# Patient Record
Sex: Male | Born: 1950 | Race: Black or African American | Hispanic: No | Marital: Married | Smoking: Never smoker
Health system: Southern US, Community
[De-identification: ages and names within clinical notes are randomized; demographics above are authoritative.]

## PROBLEM LIST (undated history)

## (undated) ENCOUNTER — Ambulatory Visit: Admission: EM

## (undated) DIAGNOSIS — N200 Calculus of kidney: Secondary | ICD-10-CM

## (undated) DIAGNOSIS — Z8719 Personal history of other diseases of the digestive system: Secondary | ICD-10-CM

## (undated) DIAGNOSIS — D734 Cyst of spleen: Secondary | ICD-10-CM

## (undated) DIAGNOSIS — I10 Essential (primary) hypertension: Secondary | ICD-10-CM

## (undated) DIAGNOSIS — Z86010 Personal history of colonic polyps: Secondary | ICD-10-CM

## (undated) DIAGNOSIS — E119 Type 2 diabetes mellitus without complications: Secondary | ICD-10-CM

## (undated) DIAGNOSIS — R112 Nausea with vomiting, unspecified: Secondary | ICD-10-CM

## (undated) DIAGNOSIS — M549 Dorsalgia, unspecified: Secondary | ICD-10-CM

## (undated) DIAGNOSIS — E785 Hyperlipidemia, unspecified: Secondary | ICD-10-CM

## (undated) DIAGNOSIS — N183 Chronic kidney disease, stage 3 unspecified: Secondary | ICD-10-CM

## (undated) DIAGNOSIS — Z87448 Personal history of other diseases of urinary system: Secondary | ICD-10-CM

## (undated) DIAGNOSIS — K760 Fatty (change of) liver, not elsewhere classified: Principal | ICD-10-CM

## (undated) DIAGNOSIS — K219 Gastro-esophageal reflux disease without esophagitis: Secondary | ICD-10-CM

## (undated) DIAGNOSIS — K7689 Other specified diseases of liver: Secondary | ICD-10-CM

## (undated) DIAGNOSIS — Z87442 Personal history of urinary calculi: Secondary | ICD-10-CM

## (undated) DIAGNOSIS — G8929 Other chronic pain: Secondary | ICD-10-CM

## (undated) DIAGNOSIS — Z9889 Other specified postprocedural states: Secondary | ICD-10-CM

## (undated) DIAGNOSIS — N201 Calculus of ureter: Secondary | ICD-10-CM

## (undated) DIAGNOSIS — I2510 Atherosclerotic heart disease of native coronary artery without angina pectoris: Secondary | ICD-10-CM

## (undated) DIAGNOSIS — T7840XA Allergy, unspecified, initial encounter: Secondary | ICD-10-CM

## (undated) DIAGNOSIS — H409 Unspecified glaucoma: Secondary | ICD-10-CM

## (undated) HISTORY — PX: OTHER SURGICAL HISTORY: SHX169

## (undated) HISTORY — PX: LITHOTRIPSY: SUR834

## (undated) HISTORY — DX: Cyst of spleen: D73.4

## (undated) HISTORY — PX: WISDOM TOOTH EXTRACTION: SHX21

## (undated) HISTORY — DX: Type 2 diabetes mellitus without complications: E11.9

## (undated) HISTORY — DX: Other chronic pain: G89.29

## (undated) HISTORY — DX: Allergy, unspecified, initial encounter: T78.40XA

## (undated) HISTORY — DX: Dorsalgia, unspecified: M54.9

## (undated) HISTORY — DX: Atherosclerotic heart disease of native coronary artery without angina pectoris: I25.10

## (undated) HISTORY — PX: ESOPHAGOGASTRODUODENOSCOPY ENDOSCOPY: SHX5814

## (undated) HISTORY — DX: Fatty (change of) liver, not elsewhere classified: K76.0

## (undated) HISTORY — DX: Unspecified glaucoma: H40.9

## (undated) HISTORY — DX: Other specified diseases of liver: K76.89

## (undated) HISTORY — DX: Hyperlipidemia, unspecified: E78.5

## (undated) HISTORY — DX: Calculus of ureter: N20.1

## (undated) HISTORY — DX: Personal history of colonic polyps: Z86.010

## (undated) HISTORY — DX: Calculus of kidney: N20.0

## (undated) HISTORY — DX: Essential (primary) hypertension: I10

## (undated) HISTORY — PX: COLONOSCOPY: SHX174

## (undated) HISTORY — PX: TONSILLECTOMY: SUR1361

---

## 1999-02-24 ENCOUNTER — Ambulatory Visit (HOSPITAL_COMMUNITY): Admission: RE | Admit: 1999-02-24 | Discharge: 1999-02-24 | Payer: Self-pay | Admitting: Neurosurgery

## 1999-02-24 ENCOUNTER — Encounter: Payer: Self-pay | Admitting: Neurosurgery

## 2000-08-11 ENCOUNTER — Encounter: Payer: Self-pay | Admitting: *Deleted

## 2000-08-11 ENCOUNTER — Encounter: Admission: RE | Admit: 2000-08-11 | Discharge: 2000-08-11 | Payer: Self-pay | Admitting: *Deleted

## 2000-11-24 ENCOUNTER — Observation Stay (HOSPITAL_COMMUNITY): Admission: EM | Admit: 2000-11-24 | Discharge: 2000-11-25 | Payer: Self-pay | Admitting: Emergency Medicine

## 2000-11-24 ENCOUNTER — Encounter: Payer: Self-pay | Admitting: *Deleted

## 2000-11-27 ENCOUNTER — Encounter: Payer: Self-pay | Admitting: *Deleted

## 2000-11-27 ENCOUNTER — Ambulatory Visit (HOSPITAL_COMMUNITY): Admission: RE | Admit: 2000-11-27 | Discharge: 2000-11-27 | Payer: Self-pay | Admitting: *Deleted

## 2000-12-31 ENCOUNTER — Encounter: Admission: RE | Admit: 2000-12-31 | Discharge: 2000-12-31 | Payer: Self-pay | Admitting: *Deleted

## 2000-12-31 ENCOUNTER — Encounter: Payer: Self-pay | Admitting: *Deleted

## 2001-09-15 ENCOUNTER — Emergency Department (HOSPITAL_COMMUNITY): Admission: EM | Admit: 2001-09-15 | Discharge: 2001-09-15 | Payer: Self-pay | Admitting: Emergency Medicine

## 2001-09-15 ENCOUNTER — Encounter: Payer: Self-pay | Admitting: Emergency Medicine

## 2002-03-09 ENCOUNTER — Encounter: Payer: Self-pay | Admitting: Emergency Medicine

## 2002-03-09 ENCOUNTER — Emergency Department (HOSPITAL_COMMUNITY): Admission: EM | Admit: 2002-03-09 | Discharge: 2002-03-09 | Payer: Self-pay | Admitting: Emergency Medicine

## 2003-08-04 ENCOUNTER — Ambulatory Visit (HOSPITAL_COMMUNITY): Admission: RE | Admit: 2003-08-04 | Discharge: 2003-08-04 | Payer: Self-pay | Admitting: Urology

## 2004-04-26 ENCOUNTER — Ambulatory Visit (HOSPITAL_COMMUNITY): Admission: RE | Admit: 2004-04-26 | Discharge: 2004-04-27 | Payer: Self-pay | Admitting: Neurosurgery

## 2004-05-29 ENCOUNTER — Ambulatory Visit: Payer: Self-pay | Admitting: Gastroenterology

## 2004-07-29 HISTORY — PX: BACK SURGERY: SHX140

## 2006-04-10 ENCOUNTER — Ambulatory Visit (HOSPITAL_COMMUNITY): Admission: RE | Admit: 2006-04-10 | Discharge: 2006-04-10 | Payer: Self-pay | Admitting: Urology

## 2006-11-12 ENCOUNTER — Encounter: Admission: RE | Admit: 2006-11-12 | Discharge: 2006-11-12 | Payer: Self-pay | Admitting: Neurosurgery

## 2007-01-09 ENCOUNTER — Ambulatory Visit: Payer: Self-pay | Admitting: Gastroenterology

## 2007-03-10 ENCOUNTER — Encounter: Admission: RE | Admit: 2007-03-10 | Discharge: 2007-03-10 | Payer: Self-pay | Admitting: Orthopedic Surgery

## 2007-09-01 ENCOUNTER — Encounter: Admission: RE | Admit: 2007-09-01 | Discharge: 2007-09-01 | Payer: Self-pay | Admitting: Neurosurgery

## 2009-09-17 ENCOUNTER — Emergency Department (HOSPITAL_COMMUNITY): Admission: EM | Admit: 2009-09-17 | Discharge: 2009-09-18 | Payer: Self-pay | Admitting: Emergency Medicine

## 2010-10-17 LAB — DIFFERENTIAL
Eosinophils Relative: 0 % (ref 0–5)
Lymphs Abs: 0.4 10*3/uL — ABNORMAL LOW (ref 0.7–4.0)
Neutrophils Relative %: 91 % — ABNORMAL HIGH (ref 43–77)

## 2010-10-17 LAB — BASIC METABOLIC PANEL
CO2: 31 mEq/L (ref 19–32)
Calcium: 9.4 mg/dL (ref 8.4–10.5)
GFR calc Af Amer: >60 mL/min (ref >60)
Sodium: 142 mEq/L (ref 135–145)

## 2010-10-17 LAB — CBC
Hemoglobin: 16.3 g/dL (ref 13.0–17.0)
MCV: 93 fL (ref 78.0–100.0)
Platelets: 251 10*3/uL (ref 150–400)
RBC: 5.16 MIL/uL (ref 4.22–5.81)
RDW: 13.4 % (ref 11.5–15.5)

## 2010-12-11 NOTE — Assessment & Plan Note (Signed)
[facility]                         GASTROENTEROLOGY OFFICE NOTE   NAME:Jeffrey Holmes, Jeffrey Holmes                            MRN:          [id number]  DATE:01/09/2007                            DOB:          [birth year]    A very nice patient comes in to discuss his medications and say goodbye.  He has GERD. He has been controlled satisfactorily on Zegerid. He says  it does great. He recently had a colonoscopic examination which was as  normal as can be. His other problems are some hyperlipidemia and renal  lithiasis. Otherwise he is in pretty good health.   PHYSICAL EXAMINATION:  VITAL SIGNS:  He weighs 167, blood pressure  120/70, pulse 64 and regular.  Neck, heart and extremities are all unremarkable.   IMPRESSION:  Gastroesophageal reflux disease controlled well with  Zegerid.   RECOMMENDATIONS:  Continue on the same medication. Followup with Dr.  Carlean Purl in the future as needed.     Clarene Reamer, MD  Electronically Signed    SML/MedQ  DD: 01/09/2007  DT: 01/09/2007  Job #: [id number]

## 2010-12-14 NOTE — H&P (Signed)
[facility]  Patient:    Jeffrey Holmes, Jeffrey Holmes                        MRN: [id number] Adm. Date:  [id number] Attending:  Larene Beach                         History and Physical  DATE OF BIRTH:  02-Sep-1950  REASON FOR ADMISSION:  This patient, age 60, presented November 24, 2000, at the office with a four to five day history of pain in the right side associated with nausea and vomiting.  HISTORY OF PRESENT ILLNESS:  This 60 year old male has a history of stones dating back to 1973 and thinks he has past about 10 stones in all.  He has been followed in our office since 1987 at irregular intervals.  The patient passed a stone January 2002 on the left without complications.  He is known to have bilateral renal calculi by x-ray and January 2002 he had a PSA of 0.4. He has not seen any blood, gravel, or stone in the urine.  His pains and nausea and vomiting started November 19, 2000.  Pain only partially controlled with [employer]  He was sent from the office to the emergency room where CT scan was performed that suggested a 4 mm calculus obstructing the upper right ureter, and he also had bilateral renal calcifications.  Because of continued pain, nausea and vomiting, impending dehydration, the patient was advised to have cystoscopy, right ureteral stent, and this will be performed November 24, 2000, after which we will schedule him for shockwave lithotripsy.  ALLERGIES:  None.  PRESENT MEDICATIONS: 1. Naproxen 500 mg b.i.d. 2. Allegra 1 q.d. 3. Mepergan Fortis 1-2 q.4-6h. p.r.n. for pain.  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  REVIEW OF SYSTEMS:  GENERAL:  Health has been good and weight stable.  HEENT: He has occasional headaches and dizziness.  CARDIORESPIRATORY:  Denies any chest pain,  heart attack, no swelling of his feet.  GI:  No hepatitis, no peptic ulcer disease, bowels have been normal.  BONES/JOINTS/MUSCLES:  He has some shoulder pain.  Occasional back pain.  NEUROPSYCHIATRIC:  Unremarkable.  FAMILY HISTORY:  There is positive history for heart disease, kidney disease, high blood pressure, diabetes.  The mother died at age 6 of kidney failure. Father died of some type of cancer at age 77.  The patient has one child.  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 He does not abuse alcohol or tobacco.  PHYSICAL EXAMINATION:  VITAL SIGNS:  Temperature 96.9, pulse 80, respirations 12, and blood pressure 140/90.  GENERAL:  Well-developed, well-nourished, 60 year old male acutely ill with right renal colic.  HEENT:  The ears and TMs are unremarkable.  Eyes react normally to light and accommodation.  Extraocular movements are intact.  Pharynx benign.  Tongue is a little bit dry.  NECK:  No enlargement of thyroid or nodes palpable.  LUNGS:  Clear to auscultation and percussion.  HEART:  No murmur detected.  ABDOMEN:  There is tenderness in the right flank.  Liver, kidney, spleen masses, hernia not detected.  Mild obesity.  GENITAL:  The urethral meatus is normal.  The  penis is circumcised.  Testes are good size, symmetrical, nontender.  Scrotum, anus, and perineum normal. Rectal tone good.  Prostate is 20 grams and symmetrical, smooth, soft, and slightly tender.  EXTREMITIES:  No edema.  Good peripheral pulses.  NEUROLOGIC:  Grossly normal reflexes and sensation.  IMPRESSION: 1. Right ureteral calculus, upper ureter with hydronephrosis by CT scan. 2. Bilateral renal calcifications. 3. Benign prostate hypertrophy.  PLAN:  Cystoscopy, insertion of a right ureteral stent (#6 Kwart) to be followed by shockwave lithotripsy to fragment the right ureteral stone. DD:  11/24/00 TD:  11/25/00 Job: [id number]

## 2010-12-14 NOTE — Op Note (Signed)
[facility]  Patient:    Jeffrey Holmes, Jeffrey Holmes                        MRN: [id number] Proc. Date: 11/24/00 Adm. Date:  [id number] Attending:  Larene Beach                           Operative Report  PREOPERATIVE DIAGNOSES: 1. Right ureteral calculus with hydronephrosis. 2. Bilateral renal calculi. 3. Benign prostatic hypertrophy. 4. Right atelectasis, mild.  POSTOPERATIVE DIAGNOSES: 1. Right ureteral calculus with hydronephrosis. 2. Bilateral renal calculi. 3. Benign prostatic hypertrophy. 4. Right atelectasis, mild.  OPERATION PERFORMED:  Cystoscopy, insertion of right ureteral stent.  DESCRIPTION OF PROCEDURE:  This 60 year old male brought to the operating room underwent successful induction of general anesthesia was prepped and draped in the lithotomy position received IV Cipro in preparation for surgery. The patient was prepped and draped in the lithotomy position in the usual fashion, bladder inspected with a #22 Olympus cystoscope using the 70 degree lens and no stone, tumor nor ulcer was noted. The right and left ureteral orifices were normal. The patient had a neck to veru distance of 2-3 cm, a little anterior notching. The right ureteral orifice accepted an 038 Glidewire and over the [place] was passed a #6 open ended catheter which passed to the region of the right renal pelvis under fluoroscopic control. The dye was then injected for retrograde pyelogram. There was only slight dilation of the renal pelvis. The open ended catheter was then used to reintroduce an 038 Glidewire and over the [place] was passed a #6 Kwart type double J ureteral stent which was passed to the region of the right renal pelvis. The Glidewire was then removed and the fluoroscopic examination showed a good curl in the region of the renal pelvis. Cystoscopically there was noted to be a good curl in the bladder.  All instruments removed, the bladder  was emptied, and the patient returned the recovery area in a stable condition.  PLAN:  Observe the patient overnight, give him Cipro prophylaxis against infection. We will instruct him not to pull the string in his urethra. We will give him Demerol for pain, Phenergan for nausea, IV fluids, incentive spirometry in view of his atelectasis and will let him go home in the morning if he is stable and afebrile in preparation for shock wave lithotripsy. DD:  11/24/00 TD:  11/25/00 Job: [id number]

## 2010-12-14 NOTE — Op Note (Signed)
Jeffrey Holmes, Jeffrey Holmes NO.:  [id number]   MEDICAL RECORD NO.:  [id number]          PATIENT TYPE:  OIB   LOCATION:  2889                         FACILITY:  [facility]   PHYSICIAN:  Hosie Spangle, M.D.DATE OF BIRTH:  October 08, 1950   DATE OF PROCEDURE:  04/26/2004  DATE OF DISCHARGE:                                 OPERATIVE REPORT   PREOPERATIVE DIAGNOSIS:  Left L5-S1 lumbar disk herniation, lumbar  spondylosis, lumbar degenerative disk disease, and lumbar radiculopathy.   POSTOPERATIVE DIAGNOSIS:  Left L5-S1 lumbar disk herniation, lumbar  spondylosis, lumbar degenerative disk disease, and lumbar radiculopathy.   OPERATION PERFORMED:  Left L5-S1 lumbar laminotomy and microdiskectomy with  microdissection.   SURGEON:  Hosie Spangle, M.D.   ASSISTANT:  Ashok Pall, M.D.   ANESTHESIA:  General endotracheal.   INDICATIONS FOR PROCEDURE:  The patient is a 60 year old man who presented  with a left lumbar radiculopathy.  He was found by MRI to have left L5-S1  lumbar disk herniation superimposed upon underlying degenerative disk  disease and spondylosis.  A decision was made to proceed with elective  laminotomy and microdiskectomy.   DESCRIPTION OF PROCEDURE:  The patient was brought to the operating room and  placed under general endotracheal anesthesia.  The patient was turned to a  prone position.  Lumbar region was prepped with Betadine soap and solution  and draped in sterile fashion.  An X-ray was taken and the L5-S1  interlaminar space was identified.  The overlying skin and subcutaneous  tissue were infiltrated with local anesthetic with epinephrine.  Incision  was made over the L5-S1 level and carried down through the subcutaneous  tissue.  Bipolar cautery and electrocautery were used to maintain  hemostasis.  Dissection was carried down to the lumbar fascia which was  incised on the left side of the midline in the paraspinal muscles.  We  dissected  the spinous process and lamina in subperiosteal fashion.  The self-  retaining retractor was placed and the L-S1 interlaminar space was  identified and x-ray was taken to confirm the localization and then the  operating microscope was draped and brought into the field to provide  additional magnification, illumination and visualization and the remainder  of the decompression was performed using microdissection and microsurgical  technique.  A laminotomy was performed using the X-Max drill and Kerrison  punches.  The ligamentum flavum was carefully removed and we identified the  thecal sac and exiting left S1 nerve root.  Foraminotomy was performed for  the left S1 nerve root.  We gently retracted the thecal sac and nerve root  medially exposing the subligamentous disk herniation.  The remaining annular  fibers were incised and diskectomy begun with pituitary rongeurs and  microcurettes.  There was spondylitic overgrowth on the posterior aspect of  L5 and S1 that was removed using an osteophyte removal tool and in the end,  all loose fragments of disk material were removed from both the disk space  and the epidural space and good decompression of the thecal sac and nerve  root was achieved.  Hemostasis was established with the use of bipolar  cautery as well as Gelfoam soaked in thrombin and bone wax to wax the edges  of the bone.  All the Gelfoam was removed prior to closure.  Good hemostasis  was established and once decompression was completed and hemostasis  confirmed, we instilled 2 mL of fentanyl and 80 mg of Depo-Medrol into the  epidural space and proceeded with closure.  The deep fascia was closed with  interrupted #1 undyed Vicryl sutures, the subcutaneous and subcuticular  layer were closed with interrupted inverted 2-0 undyed Vicryl sutures and  skin edges closed with Dermabond.  The patient tolerated the procedure well.  The estimated blood loss for this procedure was less than  25 mL.  Sponge,  needle and instrument counts were correct.  Following surgery the patient  was turned back to supine position to be reversed from anesthetic, extubated  and transferred to recovery room for further care.       RWN/MEDQ  D:  04/26/2004  T:  04/26/2004  Job:  [id number]

## 2011-05-17 ENCOUNTER — Encounter: Payer: Self-pay | Admitting: Gastroenterology

## 2012-02-14 ENCOUNTER — Other Ambulatory Visit: Payer: Self-pay | Admitting: Urology

## 2012-02-14 ENCOUNTER — Encounter (HOSPITAL_COMMUNITY): Payer: Self-pay | Admitting: *Deleted

## 2012-02-16 NOTE — H&P (Signed)
een today as a work-in for complaint of flank pain and N/V.   Active Problems Problems  1. Hydronephrosis On The Left 591 2. 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 Proximal Ureteral Stone On The Left 592.1 4. Renal Cyst 593.2  History of Present Illness        61 YO male patient of Dr. Ralene Muskrat seen today as a work-in for complaint of flank pain and N/V.  Longstanding h/o nephrolithiasis presents for acute evaluation of left flank pain.  The pain began acutely last pm and has persisted.  He has not seen a stone pass.  His pain radiates into the LLQ abdomen.  The pain is unchanged with position or activity. He denies gross hematuria, dysuria, increased frequency, urgency, fever or chills.  The pain is typical for previous stones.  Last pm pain severe enough to cause N/V - "I haven't done that in a long time."   Past Medical History Problems  1. History of  Esophageal Reflux 530.81 2. History of  Hypercholesterolemia 272.0 3. History of  Hypertension 401.9 4. History of  Nephrolithiasis V13.01  Surgical History Problems  1. History of  Ant Spinal Diskect Osteophytect Lumb Interspace Microdiscect  Current Meds 1. Caduet TABS; Therapy: (Recorded:20Feb2009) to 2. Cialis 20 MG Oral Tablet; TAKE 1 TABLET As Directed PRN; Therapy: 20Feb2009 to (Last  Rx:18Nov2011) 3. Cialis 5 MG Oral Tablet; Take 1 tablet daily; Therapy: 20Feb2009 to (Evaluate:15Feb2010); Last  Rx:20Feb2009 4. Ibuprofen 800 MG Oral Tablet; Therapy: (Recorded:20Feb2009) to 5. Meperidine HCl 50 MG Oral Tablet; TAKE 1 TO 2 TABLETS EVERY 4 HOURS AS NEEDED;  Therapy: [id number] to (Evaluate:19May2012); Last Rx:16May2012 6. Promethazine HCl 25 MG Oral Tablet; TAKE 1 TABLET Every  6 hours PRN; Therapy: [id number]  to (Last Rx:16May2012)  Allergies Medication  1. Codeine Derivatives 2. OxyCONTIN TB12 3. Percocet TABS  Family History Problems  1. Paternal history of  Death In The Family Father Throat CancerAge 31 2.  Family history of  Death In The Family Mother Renal FailureAge 83 3. Family history of  Family Health Status Number Of Children One daughter 57. Paternal history of  Laryngeal Cancer V16.2 5. Maternal history of  Renal Failure  Social History Problems  1. Caffeine Use 2-3 per day 2. 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 Alcohol Use 5. Tobacco Use  Review of Systems Genitourinary, constitutional, skin, eye, otolaryngeal, hematologic/lymphatic, cardiovascular, pulmonary, endocrine, musculoskeletal, gastrointestinal, neurological and psychiatric system(s) were reviewed and pertinent findings if present are noted.  Gastrointestinal: nausea, vomiting, flank pain and abdominal pain.    Vitals Vital Signs [Data Includes: Last 1 Day]  [id number] 10:09AM  Blood Pressure: 155 / 84 Temperature: 98.4 F Heart Rate: 64  Physical Exam Constitutional: Well nourished and well developed . No acute distress. The patient appears well hydrated.  Abdomen: The abdomen is flat. The abdomen is soft and nontender. No suprapubic tenderness. No CVA tenderness.  Skin: Normal skin turgor and normal skin color and pigmentation.  Neuro/Psych:. Mood and affect are appropriate.    Results/Data Urine [Data Includes: Last 1 Day]   [id number]  COLOR YELLOW   APPEARANCE CLEAR   SPECIFIC GRAVITY 1.025   pH 5.5   GLUCOSE NEG mg/dL  BILIRUBIN NEG   KETONE NEG mg/dL  BLOOD LARGE   PROTEIN 30 mg/dL  UROBILINOGEN 0.2 mg/dL  NITRITE NEG   LEUKOCYTE ESTERASE NEG   SQUAMOUS EPITHELIAL/HPF NONE SEEN   WBC 0-2 WBC/hpf  RBC 11-20 RBC/hpf  BACTERIA NONE SEEN   CRYSTALS NONE SEEN   CASTS Hyaline casts noted    The following images/tracing/specimen were independently visualized:  CTU: bilateral renal calculi. Mild/moderate left hydronephrosis secondary to a 9-10 mm left UPJ calculus. No distal bilateral ureteral calculus noted. Right renal cyst.  The following clinical lab  reports were reviewed:  UA. Selected Results  AU CT-STONE PROTOCOL [id number] 12:00AM Jimmey Ralph   Test Name Result Flag Reference  ** RADIOLOGY REPORT BY [facility] ** ORIGINAL APPROVED BY: Areta Haber, M.D. ON: 02/14/2012 11:37:36   *RADIOLOGY REPORT*  Clinical Data: Microscopic hematuria. Left flank and left lower quadrant pain. Known right renal cyst. History renal stones, prior lithotripsy. Benign prostatic hyperplasia. Low grade fever.  CT ABDOMEN AND PELVIS WITHOUT CONTRAST (CT UROGRAM)  Technique: Contiguous axial images of the abdomen and pelvis without oral or intravenous contrast were obtained.  Comparison: Plain film of 12/11/2010. CT urogram of 03/22/2005. Prior report not available.  Findings: Exam is limited for evaluation of entities other than urinary tract calculi due to lack of oral or intravenous contrast.   Bibasilar volume loss with mild scarring or atelectasis. Mild cardiomegaly, without pericardial or pleural effusion. Hepatic dome is incompletely imaged. Moderate hepatic steatosis, which is heterogeneous. Hepatomegaly, approximately 19 cm.  An area of greater hypoattenuation in the lateral segment left lobe measures 2.1 cm on image 10 and is new from the prior. Fluid density.  A subcapsular right hepatic lobe 1.2 cm subtly hypoattenuating lesion on image 20 is also fluid density.  Normal spleen, stomach, pancreas, gallbladder, biliary tract, adrenal glands.  Bilateral fluid density renal lesions, likely cysts.  Innumerable calcifications within the renal collecting systems and renal medulla. A proximal left ureteric stone which measures 9x12 mm on image 27. Minimal proximal caliectasis.  The distal ureters are normal in caliber.  Nonaneurysmal infrarenal abdominal aortic dilatation at 1.8 cm similar. No retroperitoneal or retrocrural adenopathy.  Scattered colonic diverticula. Normal terminal ileum and appendix. Normal  small bowel without abdominal ascites.   No pelvic adenopathy.  Normal urinary bladder and prostate. No significant free fluid. Periumbilical tiny fat containing ventral hernia on image 48. Partial fusion of the bilateral sacroiliac joints which is likely degenerative. Degenerative disc disease at the lumbosacral junction is advanced.  IMPRESSION:  1. Proximal left ureteric stone of 1.2 cm with relatively mild proximal obstruction. 2. Findings suspicious for medullary nephrocalcinosis, with innumerable renal collecting system and medullary stones bilaterally. 3. Hepatomegaly and heterogeneous hepatic steatosis. Foci of even greater hypoattenuation within the liver are indeterminate but could represent cysts. Not readily apparent on the prior exam of 2006. Especially if there is any history of primary malignancy or known liver disease, outpatient pre and post contrast abdominal MRI should be considered.   Assessment Assessed  1. Nephrolithiasis Of Both Kidneys 592.0 2. Hydronephrosis On The Left 591 3. Proximal Ureteral Stone On The Left 592.1 4. Renal Cyst 593.2  Plan  Health Maintenance (V70.0)  1. UA With REFLEX  Done: [id number] 10:01AM Nephrolithiasis Of Both Kidneys (592.0)  2. Ketorolac Tromethamine 60 MG/2ML Injection Solution; INJECT 60  MG Intramuscular; To Be  Done: [id number]; Status: HOLD FOR - Administration 3. Nucynta 50 MG Oral Tablet; Take 1 tablet every 6 hours; Therapy: [phone] to (Last  Rx:19Jul2013)      1. Ketorolac 60 mg IM. Mild relief 2. Nucynta 50 mg 1 po Q6 hrs prn #40/0RF  3. Schedule (L) ESWL by Dr. Jeffie Pollock. Risks and benefits discussed which include:  possible need for cystoureterscopy, L RPG, stone extraction if he develops acute pain, N/V, or possible non-fragmentation of stone. All questions addressed and answered to pt's satisfication. Wishes to proceed

## 2012-02-17 ENCOUNTER — Encounter (HOSPITAL_COMMUNITY): Payer: Self-pay | Admitting: *Deleted

## 2012-02-17 ENCOUNTER — Ambulatory Visit (HOSPITAL_COMMUNITY)
Admission: RE | Admit: 2012-02-17 | Discharge: 2012-02-17 | Disposition: A | Discharge status: Home / Self Care | Payer: Federal, State, Local not specified - PPO | Source: Ambulatory Visit | Attending: Urology | Admitting: Urology

## 2012-02-17 ENCOUNTER — Encounter (HOSPITAL_COMMUNITY): Payer: Self-pay | Admitting: Pharmacy Technician

## 2012-02-17 ENCOUNTER — Ambulatory Visit (HOSPITAL_COMMUNITY): Payer: Federal, State, Local not specified - PPO

## 2012-02-17 ENCOUNTER — Encounter (HOSPITAL_COMMUNITY)
Admission: RE | Disposition: A | Discharge status: Home / Self Care | Payer: Self-pay | Source: Ambulatory Visit | Attending: Urology

## 2012-02-17 DIAGNOSIS — N200 Calculus of kidney: Secondary | ICD-10-CM | POA: Insufficient documentation

## 2012-02-17 DIAGNOSIS — I10 Essential (primary) hypertension: Secondary | ICD-10-CM | POA: Insufficient documentation

## 2012-02-17 DIAGNOSIS — N201 Calculus of ureter: Secondary | ICD-10-CM

## 2012-02-17 DIAGNOSIS — Z79899 Other long term (current) drug therapy: Secondary | ICD-10-CM | POA: Insufficient documentation

## 2012-02-17 DIAGNOSIS — K219 Gastro-esophageal reflux disease without esophagitis: Secondary | ICD-10-CM | POA: Insufficient documentation

## 2012-02-17 DIAGNOSIS — E7800 Pure hypercholesterolemia, unspecified: Secondary | ICD-10-CM | POA: Insufficient documentation

## 2012-02-17 DIAGNOSIS — N1330 Unspecified hydronephrosis: Secondary | ICD-10-CM | POA: Insufficient documentation

## 2012-02-17 HISTORY — DX: Essential (primary) hypertension: I10

## 2012-02-17 SURGERY — LITHOTRIPSY, ESWL
Anesthesia: LOCAL | Laterality: Left

## 2012-02-17 MED ORDER — CIPROFLOXACIN HCL 500 MG PO TABS
500.0000 mg | ORAL_TABLET | ORAL | Status: AC
  Administered 2012-02-17: 500 mg via ORAL

## 2012-02-17 MED ORDER — DIAZEPAM 5 MG PO TABS
ORAL_TABLET | ORAL | Status: AC
  Filled 2012-02-17: qty 2

## 2012-02-17 MED ORDER — DIPHENHYDRAMINE HCL 25 MG PO CAPS
25.0000 mg | ORAL_CAPSULE | ORAL | Status: AC
  Administered 2012-02-17: 25 mg via ORAL

## 2012-02-17 MED ORDER — DIPHENHYDRAMINE HCL 25 MG PO CAPS
ORAL_CAPSULE | ORAL | Status: AC
  Filled 2012-02-17: qty 1

## 2012-02-17 MED ORDER — DIAZEPAM 5 MG PO TABS
10.0000 mg | ORAL_TABLET | ORAL | Status: AC
  Administered 2012-02-17: 10 mg via ORAL

## 2012-02-17 MED ORDER — DEXTROSE-NACL 5-0.45 % IV SOLN
INTRAVENOUS | Status: DC
  Administered 2012-02-17: 1000 mL via INTRAVENOUS

## 2012-02-17 MED ORDER — CIPROFLOXACIN HCL 500 MG PO TABS
ORAL_TABLET | ORAL | Status: AC
  Filled 2012-02-17: qty 1

## 2012-02-17 NOTE — Interval H&P Note (Signed)
History and Physical Interval Note:  02/17/2012 7:30 AM  Jeffrey Holmes  has presented today for surgery, with the diagnosis of LEFT HYDRONEPHOSIS DUE TO 34mm LEFT URETEROPELVIC JUNCTION CALCULUS  The various methods of treatment have been discussed with the patient and family. After consideration of risks, benefits and other options for treatment, the patient has consented to  Procedure(s) (LRB): EXTRACORPOREAL SHOCK WAVE LITHOTRIPSY (ESWL) (Left) as a surgical intervention .  The patient's history has been reviewed, patient examined, no change in status, stable for surgery.  I have reviewed the patient's chart and labs.  Questions were answered to the patient's satisfaction.     Ramsie Ostrander J

## 2012-03-12 ENCOUNTER — Other Ambulatory Visit: Payer: Self-pay | Admitting: Urology

## 2012-04-01 ENCOUNTER — Encounter (HOSPITAL_COMMUNITY): Payer: Self-pay | Admitting: Pharmacy Technician

## 2012-04-13 ENCOUNTER — Encounter (HOSPITAL_COMMUNITY): Payer: Self-pay | Admitting: *Deleted

## 2012-04-13 NOTE — Pre-Procedure Instructions (Signed)
Asked to bring blue folder the day of the procedure,insurance card,I.D. driver's license,wear comfortable clothing and have a driver for the day. Asked not to take Advil,Motrin,Ibuprofen,Aleve or any NSAIDS, Aspirin, or Toradol for 72 hours prior to procedure,  No vitamins or herbal medications 7 days prior to procedure. Instructed to take laxative per doctor's office instructions and eat a light dinner the evening before procedure.   To arrive at 1030 for lithotripsy procedure.

## 2012-04-15 NOTE — H&P (Signed)
ctive Problems Problems  1. Benign Prostatic Hypertrophy 600.00 2. Diffuse Abdominal Pain 789.07 right flank pain 3. Hydronephrosis On The Left 591 4. 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 Organic Impotence 607.84 6. Proximal Ureteral Stone On The Left 592.1 7. Pyuria 791.9 8. Renal Cyst 593.2 9. Ureteral Stone Left 592.1  History of Present Illness  Asir returns today in f/u from ESWL for a left ureteral stone.  He has pass a few fragments.  He is having minimal pain.  He was seen on 8/2 and still had some large left mid ureteral fragments.   Past Medical History Problems  1. History of  Esophageal Reflux 530.81 2. History of  Hypercholesterolemia 272.0 3. History of  Hypertension 401.9 4. History of  Nephrolithiasis V13.01  Surgical History Problems  1. History of  Ant Spinal Diskect Osteophytect Lumb Interspace Microdiscect 2. History of  Lithotripsy  Current Meds 1. Amlodipine-Atorvastatin 10-20 MG Oral Tablet; Therapy: [id number] to 2. CeleBREX 200 MG Oral Capsule; Therapy: [id number] to 3. Cialis 20 MG Oral Tablet; TAKE 1 TABLET As Directed PRN; Therapy: 20Feb2009 to (Last  Rx:18Nov2011) 4. Ibuprofen 800 MG Oral Tablet; Therapy: (Recorded:20Feb2009) to 5. Nucynta 50 MG Oral Tablet; Take 1 tablet every 6 hours; Therapy: [id number] to (Last  Rx:19Jul2013) 6. Tamsulosin HCl 0.4 MG Oral Capsule; TAKE 1 CAPSULE BY MOUTH  DAILY; Therapy:  02Aug2013 to (Last Rx:02Aug2013)  Requested for: 02Aug2013 7. Urocit-K 15 15 MEQ (1620 MG) Oral Tablet Extended Release; TAKE 1 TABLET Daily; Therapy:  02Aug2013 to (Last Rx:02Aug2013)  Allergies Medication  1. Codeine Derivatives 2. OxyCONTIN TB12 3. Percocet TABS  Family History Problems  1. Paternal history of  Death In The Family Father Throat CancerAge 31 2. Family history of  Death In The Family Mother Renal FailureAge 47 3. Family history of  Family Health Status Number Of Children One daughter 40. Paternal history of   Laryngeal Cancer V16.2 5. Maternal history of  Renal Failure  Social History Problems  1. Caffeine Use 2-3 per day 2. Marital History - Currently Married 3. 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 Alcohol Use 6. Tobacco Use  Review of Systems  Gastrointestinal: no nausea.  Constitutional: no fever.    Vitals Vital Signs [Data Includes: Last 1 Day]  09Aug2013 08:36AM  Blood Pressure: 188 / 90 Temperature: 97.5 F Heart Rate: 61  Results/Data Urine [Data Includes: Last 1 Day]   09Aug2013  COLOR YELLOW   APPEARANCE CLEAR   SPECIFIC GRAVITY 1.025   pH 6.0   GLUCOSE NEG mg/dL  BILIRUBIN NEG   KETONE NEG mg/dL  BLOOD SMALL   PROTEIN TRACE mg/dL  UROBILINOGEN 0.2 mg/dL  NITRITE NEG   LEUKOCYTE ESTERASE NEG   SQUAMOUS EPITHELIAL/HPF NONE SEEN   WBC 0-2 WBC/hpf  RBC 0-2 RBC/hpf  BACTERIA NONE SEEN   CRYSTALS NONE SEEN   CASTS Hyaline casts noted    The following images/tracing/specimen were independently visualized:  KUB today shows a stable LLP stone complex and his left proximal ureteral stone is unchanged from his film on 8/2 and measures about 8x34mm. No other new findings are noted.    Assessment Assessed  1. Proximal Ureteral Stone On The Left 592.1   His stone hasn't moved since 8/2 but he is minimally symptomatic.   Plan Health Maintenance (V70.0)  1. UA With REFLEX  Done: [id number] 08:21AM Proximal Ureteral Stone On The Left (592.1)  2. Follow-up Schedule Surgery Office  Follow-up  Requested for: 09Aug2013 3.  KUB  Requested for: 09Aug2013   I discussed his options which included continued observation with Rapaflo, repeat ESWL or ureteroscopy.   I explained that with the ESWL he may still need ureteroscopy and could required initial stenting with delayed ureteroscopy if he has a tight ureter.   He would like to proceed with repeat ESWL in about 3 weeks and I will get a KUB 3-4 days ahead just to make sure he wouldn't be better served with  ureteroscopy if the stone has migrated.

## 2012-04-16 ENCOUNTER — Encounter (HOSPITAL_COMMUNITY): Payer: Self-pay | Admitting: *Deleted

## 2012-04-16 ENCOUNTER — Ambulatory Visit (HOSPITAL_COMMUNITY)
Admission: RE | Admit: 2012-04-16 | Discharge: 2012-04-16 | Disposition: A | Discharge status: Home / Self Care | Payer: Federal, State, Local not specified - PPO | Source: Ambulatory Visit | Attending: Urology | Admitting: Urology

## 2012-04-16 ENCOUNTER — Encounter (HOSPITAL_COMMUNITY)
Admission: RE | Disposition: A | Discharge status: Home / Self Care | Payer: Self-pay | Source: Ambulatory Visit | Attending: Urology

## 2012-04-16 DIAGNOSIS — I10 Essential (primary) hypertension: Secondary | ICD-10-CM | POA: Insufficient documentation

## 2012-04-16 DIAGNOSIS — N201 Calculus of ureter: Secondary | ICD-10-CM | POA: Insufficient documentation

## 2012-04-16 DIAGNOSIS — E7800 Pure hypercholesterolemia, unspecified: Secondary | ICD-10-CM | POA: Insufficient documentation

## 2012-04-16 DIAGNOSIS — K219 Gastro-esophageal reflux disease without esophagitis: Secondary | ICD-10-CM | POA: Insufficient documentation

## 2012-04-16 DIAGNOSIS — N1330 Unspecified hydronephrosis: Secondary | ICD-10-CM | POA: Insufficient documentation

## 2012-04-16 DIAGNOSIS — Z79899 Other long term (current) drug therapy: Secondary | ICD-10-CM | POA: Insufficient documentation

## 2012-04-16 DIAGNOSIS — N400 Enlarged prostate without lower urinary tract symptoms: Secondary | ICD-10-CM | POA: Insufficient documentation

## 2012-04-16 SURGERY — LITHOTRIPSY, ESWL
Anesthesia: LOCAL | Laterality: Left

## 2012-04-16 MED ORDER — DIPHENHYDRAMINE HCL 25 MG PO CAPS
25.0000 mg | ORAL_CAPSULE | ORAL | Status: AC
  Administered 2012-04-16: 25 mg via ORAL

## 2012-04-16 MED ORDER — DEXTROSE-NACL 5-0.45 % IV SOLN
INTRAVENOUS | Status: DC
  Administered 2012-04-16: 12:00:00 via INTRAVENOUS

## 2012-04-16 MED ORDER — OXYCODONE HCL 5 MG PO TABS
5.0000 mg | ORAL_TABLET | ORAL | Status: DC | PRN
  Administered 2012-04-16: 5 mg via ORAL
  Filled 2012-04-16: qty 1

## 2012-04-16 MED ORDER — DIAZEPAM 5 MG PO TABS
ORAL_TABLET | ORAL | Status: AC
  Administered 2012-04-16: 10 mg via ORAL
  Filled 2012-04-16: qty 2

## 2012-04-16 MED ORDER — DIPHENHYDRAMINE HCL 25 MG PO CAPS
ORAL_CAPSULE | ORAL | Status: AC
  Administered 2012-04-16: 25 mg via ORAL
  Filled 2012-04-16: qty 1

## 2012-04-16 MED ORDER — DIAZEPAM 5 MG PO TABS
10.0000 mg | ORAL_TABLET | ORAL | Status: AC
  Administered 2012-04-16: 10 mg via ORAL

## 2012-04-16 MED ORDER — CIPROFLOXACIN HCL 500 MG PO TABS
500.0000 mg | ORAL_TABLET | ORAL | Status: AC
  Administered 2012-04-16: 500 mg via ORAL
  Filled 2012-04-16: qty 1

## 2012-04-16 NOTE — Interval H&P Note (Signed)
History and Physical Interval Note:  04/16/2012 12:34 PM  [name]  has presented today for surgery, with the diagnosis of Left Proximal Stone  The various methods of treatment have been discussed with the patient and family. After consideration of risks, benefits and other options for treatment, the patient has consented to  Procedure(s) (LRB) with comments: EXTRACORPOREAL SHOCK WAVE LITHOTRIPSY (ESWL) (Left) as a surgical intervention .  The patient's history has been reviewed, patient examined, no change in status, stable for surgery.  I have reviewed the patient's chart and labs.  Questions were answered to the patient's satisfaction.     Jeffrey Holmes

## 2012-04-17 ENCOUNTER — Encounter: Payer: Self-pay | Admitting: Gastroenterology

## 2013-07-29 DIAGNOSIS — Z87448 Personal history of other diseases of urinary system: Secondary | ICD-10-CM

## 2013-07-29 HISTORY — DX: Personal history of other diseases of urinary system: Z87.448

## 2013-12-22 ENCOUNTER — Other Ambulatory Visit: Payer: Self-pay | Admitting: Urology

## 2013-12-29 ENCOUNTER — Encounter (HOSPITAL_COMMUNITY): Payer: Self-pay | Admitting: Pharmacy Technician

## 2014-01-03 ENCOUNTER — Other Ambulatory Visit: Payer: Self-pay | Admitting: Urology

## 2014-01-03 DIAGNOSIS — D7389 Other diseases of spleen: Secondary | ICD-10-CM

## 2014-01-03 DIAGNOSIS — D739 Disease of spleen, unspecified: Principal | ICD-10-CM

## 2014-01-04 ENCOUNTER — Encounter (HOSPITAL_COMMUNITY): Payer: Self-pay

## 2014-01-05 ENCOUNTER — Other Ambulatory Visit: Payer: Self-pay | Admitting: Urology

## 2014-01-05 ENCOUNTER — Ambulatory Visit
Admission: RE | Admit: 2014-01-05 | Discharge: 2014-01-05 | Disposition: A | Discharge status: Home / Self Care | Payer: Federal, State, Local not specified - PPO | Source: Ambulatory Visit | Attending: Urology | Admitting: Urology

## 2014-01-05 DIAGNOSIS — D7389 Other diseases of spleen: Secondary | ICD-10-CM

## 2014-01-05 DIAGNOSIS — D739 Disease of spleen, unspecified: Principal | ICD-10-CM

## 2014-01-06 ENCOUNTER — Inpatient Hospital Stay (HOSPITAL_COMMUNITY): Payer: Federal, State, Local not specified - PPO | Admitting: Registered Nurse

## 2014-01-06 ENCOUNTER — Encounter (HOSPITAL_COMMUNITY): Payer: Federal, State, Local not specified - PPO | Admitting: Registered Nurse

## 2014-01-06 ENCOUNTER — Encounter (HOSPITAL_COMMUNITY): Payer: Self-pay

## 2014-01-06 ENCOUNTER — Inpatient Hospital Stay (HOSPITAL_COMMUNITY): Payer: Federal, State, Local not specified - PPO

## 2014-01-06 ENCOUNTER — Inpatient Hospital Stay (HOSPITAL_COMMUNITY)
Admission: AD | Admit: 2014-01-06 | Discharge: 2014-01-10 | DRG: 693 | Disposition: A | Discharge status: Home / Self Care | Payer: Federal, State, Local not specified - PPO | Source: Ambulatory Visit | Attending: Urology | Admitting: Urology

## 2014-01-06 ENCOUNTER — Encounter (HOSPITAL_COMMUNITY)
Admission: AD | Disposition: A | Discharge status: Home / Self Care | Payer: Self-pay | Source: Ambulatory Visit | Attending: Urology

## 2014-01-06 ENCOUNTER — Other Ambulatory Visit: Payer: Self-pay

## 2014-01-06 DIAGNOSIS — N1330 Unspecified hydronephrosis: Secondary | ICD-10-CM

## 2014-01-06 DIAGNOSIS — K219 Gastro-esophageal reflux disease without esophagitis: Secondary | ICD-10-CM | POA: Diagnosis present

## 2014-01-06 DIAGNOSIS — I129 Hypertensive chronic kidney disease with stage 1 through stage 4 chronic kidney disease, or unspecified chronic kidney disease: Secondary | ICD-10-CM | POA: Diagnosis present

## 2014-01-06 DIAGNOSIS — Z87448 Personal history of other diseases of urinary system: Secondary | ICD-10-CM | POA: Diagnosis present

## 2014-01-06 DIAGNOSIS — N170 Acute kidney failure with tubular necrosis: Secondary | ICD-10-CM | POA: Diagnosis present

## 2014-01-06 DIAGNOSIS — N12 Tubulo-interstitial nephritis, not specified as acute or chronic: Secondary | ICD-10-CM | POA: Diagnosis present

## 2014-01-06 DIAGNOSIS — E7800 Pure hypercholesterolemia, unspecified: Secondary | ICD-10-CM | POA: Diagnosis present

## 2014-01-06 DIAGNOSIS — E860 Dehydration: Secondary | ICD-10-CM | POA: Diagnosis present

## 2014-01-06 DIAGNOSIS — N201 Calculus of ureter: Secondary | ICD-10-CM

## 2014-01-06 DIAGNOSIS — E875 Hyperkalemia: Secondary | ICD-10-CM | POA: Diagnosis present

## 2014-01-06 DIAGNOSIS — T3995XA Adverse effect of unspecified nonopioid analgesic, antipyretic and antirheumatic, initial encounter: Secondary | ICD-10-CM | POA: Diagnosis present

## 2014-01-06 DIAGNOSIS — Z79899 Other long term (current) drug therapy: Secondary | ICD-10-CM

## 2014-01-06 DIAGNOSIS — Z87442 Personal history of urinary calculi: Secondary | ICD-10-CM

## 2014-01-06 DIAGNOSIS — Z841 Family history of disorders of kidney and ureter: Secondary | ICD-10-CM

## 2014-01-06 DIAGNOSIS — K7689 Other specified diseases of liver: Secondary | ICD-10-CM | POA: Diagnosis present

## 2014-01-06 DIAGNOSIS — E872 Acidosis, unspecified: Secondary | ICD-10-CM | POA: Diagnosis present

## 2014-01-06 DIAGNOSIS — Z885 Allergy status to narcotic agent status: Secondary | ICD-10-CM

## 2014-01-06 DIAGNOSIS — N183 Chronic kidney disease, stage 3 unspecified: Secondary | ICD-10-CM | POA: Diagnosis present

## 2014-01-06 HISTORY — DX: Calculus of ureter: N20.1

## 2014-01-06 HISTORY — PX: CYSTOSCOPY WITH RETROGRADE PYELOGRAM, URETEROSCOPY AND STENT PLACEMENT: SHX5789

## 2014-01-06 LAB — URINALYSIS, ROUTINE W REFLEX MICROSCOPIC
Bilirubin Urine: NEGATIVE
Glucose, UA: NEGATIVE mg/dL
Ketones, ur: NEGATIVE mg/dL
Nitrite: NEGATIVE
PH: 5.5 (ref 5.0–8.0)
Protein, ur: 100 mg/dL — AB
Specific Gravity, Urine: 1.012 (ref 1.005–1.030)
Urobilinogen, UA: 0.2 mg/dL (ref 0.0–1.0)

## 2014-01-06 LAB — RENAL FUNCTION PANEL
ALBUMIN: 4.1 g/dL (ref 3.5–5.2)
BUN: 73 mg/dL — ABNORMAL HIGH (ref 6–23)
CO2: 17 mEq/L — ABNORMAL LOW (ref 19–32)
Calcium: 10.1 mg/dL (ref 8.4–10.5)
Chloride: 104 mEq/L (ref 96–112)
Creatinine, Ser: 7.11 mg/dL — ABNORMAL HIGH (ref 0.50–1.35)
GFR calc Af Amer: 8 mL/min — ABNORMAL LOW (ref >90)
GFR calc non Af Amer: 7 mL/min — ABNORMAL LOW (ref >90)
Glucose, Bld: 105 mg/dL — ABNORMAL HIGH (ref 70–99)
PHOSPHORUS: 5.1 mg/dL — AB (ref 2.3–4.6)
POTASSIUM: 6.4 meq/L — AB (ref 3.7–5.3)
Sodium: 139 mEq/L (ref 137–147)

## 2014-01-06 LAB — CBC
HEMATOCRIT: 42.2 % (ref 39.0–52.0)
Hemoglobin: 14.4 g/dL (ref 13.0–17.0)
MCH: 30.3 pg (ref 26.0–34.0)
MCHC: 34.1 g/dL (ref 30.0–36.0)
MCV: 88.7 fL (ref 78.0–100.0)
Platelets: 460 10*3/uL — ABNORMAL HIGH (ref 150–400)
RBC: 4.76 MIL/uL (ref 4.22–5.81)
RDW: 13 % (ref 11.5–15.5)
WBC: 12.7 10*3/uL — ABNORMAL HIGH (ref 4.0–10.5)

## 2014-01-06 LAB — URINE MICROSCOPIC-ADD ON

## 2014-01-06 LAB — CREATININE, SERUM
Creatinine, Ser: 6.79 mg/dL — ABNORMAL HIGH (ref 0.50–1.35)
GFR calc Af Amer: 9 mL/min — ABNORMAL LOW (ref >90)
GFR, EST NON AFRICAN AMERICAN: 8 mL/min — AB (ref >90)

## 2014-01-06 LAB — CK: Total CK: 78 U/L (ref 7–232)

## 2014-01-06 SURGERY — CYSTOURETEROSCOPY, WITH RETROGRADE PYELOGRAM AND STENT INSERTION
Anesthesia: General | Site: Ureter | Laterality: Right

## 2014-01-06 MED ORDER — ONDANSETRON HCL 4 MG/2ML IJ SOLN
INTRAMUSCULAR | Status: AC
  Filled 2014-01-06: qty 2

## 2014-01-06 MED ORDER — STERILE WATER FOR INJECTION IV SOLN
150.0000 meq | INTRAVENOUS | Status: DC
  Administered 2014-01-06: 150 meq via INTRAVENOUS
  Filled 2014-01-06 (×3): qty 850

## 2014-01-06 MED ORDER — LATANOPROST 0.005 % OP SOLN
1.0000 [drp] | Freq: Every day | OPHTHALMIC | Status: DC
  Administered 2014-01-06 – 2014-01-09 (×4): 1 [drp] via OPHTHALMIC
  Filled 2014-01-06: qty 2.5

## 2014-01-06 MED ORDER — SODIUM CHLORIDE 0.9 % IR SOLN
Status: DC | PRN
  Administered 2014-01-06: 3000 mL

## 2014-01-06 MED ORDER — ACETAMINOPHEN 325 MG PO TABS: 650.0000 mg | ORAL_TABLET | ORAL | Status: DC | PRN

## 2014-01-06 MED ORDER — MIDAZOLAM HCL 5 MG/5ML IJ SOLN
INTRAMUSCULAR | Status: DC | PRN
  Administered 2014-01-06: 2 mg via INTRAVENOUS

## 2014-01-06 MED ORDER — BELLADONNA ALKALOIDS-OPIUM 16.2-60 MG RE SUPP
RECTAL | Status: AC
  Filled 2014-01-06: qty 1

## 2014-01-06 MED ORDER — LACTATED RINGERS IV SOLN: INTRAVENOUS | Status: DC

## 2014-01-06 MED ORDER — PROMETHAZINE HCL 25 MG PO TABS
25.0000 mg | ORAL_TABLET | Freq: Four times a day (QID) | ORAL | Status: DC | PRN
  Filled 2014-01-06: qty 1

## 2014-01-06 MED ORDER — ONDANSETRON HCL 4 MG/2ML IJ SOLN
4.0000 mg | INTRAMUSCULAR | Status: DC | PRN
  Administered 2014-01-07 (×2): 4 mg via INTRAVENOUS
  Filled 2014-01-06 (×2): qty 2

## 2014-01-06 MED ORDER — LACTATED RINGERS IV SOLN
INTRAVENOUS | Status: DC
  Administered 2014-01-06: 1000 mL via INTRAVENOUS

## 2014-01-06 MED ORDER — SODIUM POLYSTYRENE SULFONATE 15 GM/60ML PO SUSP
30.0000 g | Freq: Once | ORAL | Status: AC
  Administered 2014-01-06: 30 g via ORAL
  Filled 2014-01-06: qty 120

## 2014-01-06 MED ORDER — AMLODIPINE BESYLATE 10 MG PO TABS
10.0000 mg | ORAL_TABLET | Freq: Every day | ORAL | Status: DC
  Administered 2014-01-07 – 2014-01-09 (×3): 10 mg via ORAL
  Filled 2014-01-06 (×5): qty 1

## 2014-01-06 MED ORDER — CEFAZOLIN SODIUM-DEXTROSE 2-3 GM-% IV SOLR
INTRAVENOUS | Status: AC
  Filled 2014-01-06: qty 50

## 2014-01-06 MED ORDER — HYDROMORPHONE HCL PF 1 MG/ML IJ SOLN
0.5000 mg | INTRAMUSCULAR | Status: DC | PRN
  Administered 2014-01-07 (×3): 1 mg via INTRAVENOUS
  Filled 2014-01-06 (×3): qty 1

## 2014-01-06 MED ORDER — MIDAZOLAM HCL 2 MG/2ML IJ SOLN
INTRAMUSCULAR | Status: AC
  Filled 2014-01-06: qty 2

## 2014-01-06 MED ORDER — PROPOFOL 10 MG/ML IV BOLUS
INTRAVENOUS | Status: AC
  Filled 2014-01-06: qty 20

## 2014-01-06 MED ORDER — FENTANYL CITRATE 0.05 MG/ML IJ SOLN: 25.0000 ug | INTRAMUSCULAR | Status: DC | PRN

## 2014-01-06 MED ORDER — FENTANYL CITRATE 0.05 MG/ML IJ SOLN
INTRAMUSCULAR | Status: DC | PRN
  Administered 2014-01-06 (×2): 50 ug via INTRAVENOUS

## 2014-01-06 MED ORDER — IOHEXOL 350 MG/ML SOLN
INTRAVENOUS | Status: DC | PRN
  Administered 2014-01-06: 50 mL
  Administered 2014-01-06: 10 mL

## 2014-01-06 MED ORDER — OXYBUTYNIN CHLORIDE 5 MG PO TABS
5.0000 mg | ORAL_TABLET | Freq: Three times a day (TID) | ORAL | Status: DC | PRN
  Filled 2014-01-06: qty 1

## 2014-01-06 MED ORDER — AMLODIPINE BESY-BENAZEPRIL HCL 10-20 MG PO CAPS: 1.0000 | ORAL_CAPSULE | Freq: Every morning | ORAL | Status: DC

## 2014-01-06 MED ORDER — BELLADONNA ALKALOIDS-OPIUM 16.2-60 MG RE SUPP
RECTAL | Status: DC | PRN
  Administered 2014-01-06: 1 via RECTAL

## 2014-01-06 MED ORDER — DEXTROSE-NACL 5-0.45 % IV SOLN
INTRAVENOUS | Status: DC
  Administered 2014-01-06: 50 mL/h via INTRAVENOUS

## 2014-01-06 MED ORDER — 0.9 % SODIUM CHLORIDE (POUR BTL) OPTIME
TOPICAL | Status: DC | PRN
  Administered 2014-01-06: 1000 mL

## 2014-01-06 MED ORDER — LIDOCAINE HCL (PF) 2 % IJ SOLN
INTRAMUSCULAR | Status: DC | PRN
  Administered 2014-01-06: 75 mg via INTRADERMAL

## 2014-01-06 MED ORDER — LIDOCAINE HCL 2 % EX GEL
CUTANEOUS | Status: AC
  Filled 2014-01-06: qty 10

## 2014-01-06 MED ORDER — FENTANYL CITRATE 0.05 MG/ML IJ SOLN
INTRAMUSCULAR | Status: AC
  Filled 2014-01-06: qty 5

## 2014-01-06 MED ORDER — BENAZEPRIL HCL 20 MG PO TABS: 20.0000 mg | ORAL_TABLET | Freq: Every day | ORAL | Status: DC

## 2014-01-06 MED ORDER — PROPOFOL 10 MG/ML IV BOLUS
INTRAVENOUS | Status: DC | PRN
  Administered 2014-01-06: 160 mg via INTRAVENOUS

## 2014-01-06 MED ORDER — HEPARIN SODIUM (PORCINE) 5000 UNIT/ML IJ SOLN
5000.0000 [IU] | Freq: Three times a day (TID) | INTRAMUSCULAR | Status: DC
  Administered 2014-01-07 – 2014-01-10 (×9): 5000 [IU] via SUBCUTANEOUS
  Filled 2014-01-06 (×13): qty 1

## 2014-01-06 MED ORDER — TRAVOPROST 0.004 % OP SOLN: 1.0000 [drp] | Freq: Every day | OPHTHALMIC | Status: DC

## 2014-01-06 MED ORDER — ONDANSETRON HCL 4 MG/2ML IJ SOLN
INTRAMUSCULAR | Status: DC | PRN
  Administered 2014-01-06: 4 mg via INTRAVENOUS

## 2014-01-06 MED ORDER — LIDOCAINE HCL (CARDIAC) 20 MG/ML IV SOLN
INTRAVENOUS | Status: AC
  Filled 2014-01-06: qty 5

## 2014-01-06 MED ORDER — CEFAZOLIN SODIUM-DEXTROSE 2-3 GM-% IV SOLR
2.0000 g | INTRAVENOUS | Status: AC
  Administered 2014-01-06: 2 g via INTRAVENOUS

## 2014-01-06 SURGICAL SUPPLY — 16 items
BAG URINE DRAINAGE (UROLOGICAL SUPPLIES) ×3 IMPLANT
BASKET ZERO TIP NITINOL 2.4FR (BASKET) IMPLANT
BSKT STON RTRVL ZERO TP 2.4FR (BASKET)
CATH INTERMIT  6FR 70CM (CATHETERS) IMPLANT
CLOTH BEACON ORANGE TIMEOUT ST (SAFETY) ×3 IMPLANT
DRAPE CAMERA CLOSED 9X96 (DRAPES) ×3 IMPLANT
FIBER LASER FLEXIVA 200 (UROLOGICAL SUPPLIES) IMPLANT
FIBER LASER FLEXIVA 365 (UROLOGICAL SUPPLIES) IMPLANT
GLOVE BIOGEL M STRL SZ7.5 (GLOVE) ×3 IMPLANT
GOWN STRL REUS W/TWL XL LVL3 (GOWN DISPOSABLE) IMPLANT
GUIDEWIRE .038 (WIRE) IMPLANT
GUIDEWIRE ANG ZIPWIRE 038X150 (WIRE) IMPLANT
GUIDEWIRE STR DUAL SENSOR (WIRE) ×3 IMPLANT
IV NS IRRIG 3000ML ARTHROMATIC (IV SOLUTION) ×3 IMPLANT
PACK CYSTO (CUSTOM PROCEDURE TRAY) ×3 IMPLANT
STENT CONTOUR 7FRX24X.038 (STENTS) ×2 IMPLANT

## 2014-01-06 NOTE — Transfer of Care (Signed)
Immediate Anesthesia Transfer of Care Note  Patient: Jeffrey Holmes  Procedure(s) Performed: Procedure(s): CYSTOSCOPY WITH RETROGRADE PYELOGRAM, URETEROSCOPY AND STENT PLACEMENT (Right)  Patient Location: PACU  Anesthesia Type:General  Level of Consciousness: sedated and responds to stimulation  Airway & Oxygen Therapy: Patient Spontanous Breathing and Patient connected to face mask oxygen  Post-op Assessment: Report given to PACU RN and Post -op Vital signs reviewed and stable  Post vital signs: Reviewed and stable  Complications: No apparent anesthesia complications

## 2014-01-06 NOTE — Anesthesia Preprocedure Evaluation (Addendum)
Anesthesia Evaluation  Patient identified by MRN, date of birth, ID band Patient awake    Reviewed: Allergy & Precautions, H&P , NPO status , Patient's Chart, lab work & pertinent test results  Airway Mallampati: II TM Distance: >3 FB Neck ROM: full    Dental no notable dental hx. (+) Teeth Intact, Dental Advisory Given   Pulmonary neg pulmonary ROS,  breath sounds clear to auscultation  Pulmonary exam normal       Cardiovascular Exercise Tolerance: Good hypertension, Pt. on medications Rhythm:regular Rate:Normal     Neuro/Psych negative neurological ROS  negative psych ROS   GI/Hepatic negative GI ROS, Neg liver ROS, hiatal hernia,   Endo/Other  negative endocrine ROS  Renal/GU negative Renal ROS  negative genitourinary   Musculoskeletal   Abdominal   Peds  Hematology negative hematology ROS (+)   Anesthesia Other Findings   Reproductive/Obstetrics negative OB ROS                          Anesthesia Physical Anesthesia Plan  ASA: II  Anesthesia Plan: General   Post-op Pain Management:    Induction: Intravenous  Airway Management Planned: LMA  Additional Equipment:   Intra-op Plan:   Post-operative Plan:   Informed Consent: I have reviewed the patients History and Physical, chart, labs and discussed the procedure including the risks, benefits and alternatives for the proposed anesthesia with the patient or authorized representative who has indicated his/her understanding and acceptance.   Dental Advisory Given  Plan Discussed with: CRNA and Surgeon  Anesthesia Plan Comments:         Anesthesia Quick Evaluation

## 2014-01-06 NOTE — Anesthesia Postprocedure Evaluation (Signed)
  Anesthesia Post-op Note  Patient: Jeffrey Holmes  Procedure(s) Performed: Procedure(s) (LRB): CYSTOSCOPY WITH RETROGRADE PYELOGRAM, URETEROSCOPY AND STENT PLACEMENT (Right)  Patient Location: PACU  Anesthesia Type: General  Level of Consciousness: awake and alert   Airway and Oxygen Therapy: Patient Spontanous Breathing  Post-op Pain: mild  Post-op Assessment: Post-op Vital signs reviewed, Patient's Cardiovascular Status Stable, Respiratory Function Stable, Patent Airway and No signs of Nausea or vomiting  Last Vitals:  Filed Vitals:   01/06/14 1545  BP:   Pulse: 70  Temp: 36.7 C  Resp:     Post-op Vital Signs: stable   Complications: No apparent anesthesia complications

## 2014-01-06 NOTE — H&P (Signed)
History of Present Illness   Jeffrey Holmes presents today as an urgent walk-in with concerns about acute renal failure. Jeffrey Holmes is currently 63 years of age. He has been a patient of Dr Ralene Muskrat and prior to that, Dr Leory Plowman. More recently he saw Dr Jasmine December. I have reviewed previous notes as well as recent imaging. He was thought to have a proximal ureteral stone dating back to March. Initially read as potentially 6 mm in size. A formal stone protocol CT at the end of May showed what appeared to be more like a 1 cm stone in the proximal right ureter/UPJ region causing at least moderate hydronephrosis. Creatinine at that time was 1.5 and there was no definitive indication for urgent intervention. Dr Jeffie Pollock got Jeffrey Limbach to agree to ESWL and that has been arranged for early next week. The patient recently underwent MRI Imaging to check a splenic abnormality. Apparently a creatinine was drawn prior to that study, which was 6.6. I do not see BUN or potassium levels anywhere in the [facility] system. Because of his markedly elevated creatinine, he was asked to come in here on a more urgent basis. He last ate or drank anything around 7 o'clock this morning. His overall clinical situation has not changed substantially. He has had some on and off mild abdominal discomfort and nausea. He tells me for years he was a heavy user of Ibuprofen but has reduced his nonsteroidal antiinflammatory intake. He has been taking some on and off oral Toradol but not in any large quantities. Urinalysis today does show some ongoing microhematuria, no evidence of active sediment, any evidence of infection. When the patient was here several days ago to see Dr Jasmine December, there had been a question as to possibly having some fevers and Dr Jasmine December thought a stent should be considered. Jeffrey Downie elected not to proceed with stent placement at that time. He has had a STAT BMET drawn and we are awaiting those results. He clinically otherwise has had no change in voiding  patterns.      Past Medical History Problems  1. History of Calculus of left ureter (592.1) 2. History of Calculus of ureter (592.1) 3. History of esophageal reflux (V12.79) 4. History of hypercholesterolemia (V12.29) 5. History of hypertension (V12.59) 6. History of kidney stones (V13.01) 7. History of Hydronephrosis, left (591) 8. History of Pyuria (791.9)  Surgical History Problems  1. History of Ant Spinal Diskect Osteophytect Lumb Interspace Microdiscect 2. History of Lithotripsy 3. History of Lithotripsy  Current Meds 1. Amlodipine-Atorvastatin 10-20 MG Oral Tablet;  Therapy: [id number] to Recorded 2. Cephalexin 500 MG Oral Capsule; TAKE 1 CAPSULE 4 times daily;  Therapy: [id number] to (Evaluate:15Jun2015)  Requested for: [phone]; Last  Rx:08Jun2015 Ordered 3. Cialis 20 MG Oral Tablet; TAKE 1 TABLET As Directed PRN;  Therapy: 20Feb2009 to (Last Rx:22Sep2014)  Requested for: 22Sep2014 Ordered 4. Omega-3-acid Ethyl Esters 1 GM Oral Capsule;  Therapy: [id number] to Recorded 5. Ondansetron HCl - 4 MG Oral Tablet; TAKE 1 TABLET Every 4 hours PRN nausea;  Therapy: [phone] to ([id number])  Requested for: [phone]; Last  Rx:08Jun2015 Ordered 6. Potassium Citrate ER 15 MEQ (1620 MG) Oral Tablet Extended Release; Take 1 tablet  twice daily;  Therapy: [id number] to (Last Rx:08Apr2015)  Requested for: 08Apr2015 Ordered 7. TraMADol HCl - 50 MG Oral Tablet; TAKE 1 TO 2 TABLETS EVERY 4 HOURS AS NEEDED  FOR PAIN;  Therapy: [id number] to (Evaluate:15Jun2015); Last Rx:08Jun2015 Ordered  Allergies Medication  1. Codeine Derivatives 2. Hydrocodone-Acetaminophen  CAPS 3. OxyCONTIN TB12 4. Percocet TABS  Family History Problems  1. Family history of Death In The Family Father : Father   Throat CancerAge 31 2. Family history of Death In The Family Mother   Renal FailureAge 76 3. Family history of Family Health Status Number Of Children   One daughter 23. Family history  of Laryngeal Cancer (V16.2) : Father 5. Family history of Renal Failure : Mother  Social History Problems  1. Denied: Alcohol Use 2. Caffeine Use   2-3 per day 3. Marital History - Currently Married 4. 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 Denied: Tobacco Use  Review of Systems Genitourinary, constitutional, skin, eye, otolaryngeal, hematologic/lymphatic, cardiovascular, pulmonary, endocrine, musculoskeletal, gastrointestinal, neurological and psychiatric system(s) were reviewed and pertinent findings if present are noted.  Gastrointestinal: nausea, but no abdominal pain.  Constitutional: feeling tired (fatigue).    Vitals Vital Signs [Data Includes: Last 1 Day]  Recorded: [id number] 10:21AM  Blood Pressure: 135 / 82 Temperature: 98.5 F Heart Rate: 79  Physical Exam Constitutional: Well nourished and well developed . No acute distress.  ENT:. The ears and nose are normal in appearance.  Neck: The appearance of the neck is normal and no neck mass is present.  Pulmonary: No respiratory distress and normal respiratory rhythm and effort.  Cardiovascular: Heart rate and rhythm are normal . No peripheral edema.  Abdomen: The abdomen is soft and nontender. No masses are palpated. No CVA tenderness. No hernias are palpable. No hepatosplenomegaly noted.  Skin: Normal skin turgor, no visible rash and no visible skin lesions.  Neuro/Psych:. Mood and affect are appropriate.    Results/Data Urine [Data Includes: Last 1 Day]   [id number]  COLOR YELLOW   APPEARANCE CLEAR   SPECIFIC GRAVITY 1.025   pH 5.5   GLUCOSE NEG mg/dL  BILIRUBIN NEG   KETONE NEG mg/dL  BLOOD MOD   PROTEIN TRACE mg/dL  UROBILINOGEN 0.2 mg/dL  NITRITE NEG   LEUKOCYTE ESTERASE NEG   SQUAMOUS EPITHELIAL/HPF RARE   WBC 0-2 WBC/hpf  RBC 11-20 RBC/hpf  BACTERIA NONE SEEN   CRYSTALS NONE SEEN   CASTS NONE SEEN   Other MUCUS    Assessment Assessed  1. Hydronephrosis, right (591) 2. Bilateral  kidney stones (592.0) 3. Calculus of right ureter (592.1) 4. Renal insufficiency (593.9)  Plan  Health Maintenance  1. UA With REFLEX; [Do Not Release]; Status:Complete;   [id number] 10:03AM Hydronephrosis, right, Renal insufficiency  2. BUN & CREATININE; Status:Canceled - Specimen/Data Collection,Appointment;  Right flank pain  3. KUB; Status:Hold For - Date of Service; Requested [id number];  4. Follow-up Office  Follow-up  Status: Hold For - Date of Service  Requested for:  29Jun2015  Discussion/Summary Jeffrey. Portera had a stat metabolic panel performed. Creatinine is markedly elevated at 6.3. Potassium is 5.9. This patient probably has an obstructive component to his renal failure without not explain a deterioration in his renal function to this degree. Some of this may be related to nonsteroidal anti-inflammatory use. We will call consultation to the nephrology service admit the patient has a direct admission. We'll plan on double-J stent placement of the right kidney sometime later today. Open without we'll at least improve his renal function although I suspect he will not normalized. The patient's definitive ESWL may need to be postponed pending on how he does over the next 48-72 hours.   Signatures Electronically signed by : Rana Snare, M.D.; Jan 06 2014  2:47PM EST

## 2014-01-06 NOTE — Op Note (Signed)
Preoperative diagnosis: Right hydronephrosis, 10 mm right UPJ stone and acute renal failure Postoperative diagnosis: Same  Procedure: Cystoscopy, right retrograde pyelography, right double-J stent placement   Surgeon: Bernestine Amass M.D.  Anesthesia: Gen.  Indications: Patient is a 63 year old male who has a prior history of nephrolithiasis. He has been in multiple times over last several months to see Dr. Jeffie Pollock. He has been diagnosed with a 10 mm stone in his right ureteral pelvic junction causing hydronephrosis. As a baseline he appears to have mild chronic renal insufficiency. He had a creatinine determined after he is stone diagnosis which was 1.5. Because of a splenic finding on his CT scan he underwent MRI imaging. Repeat blood work was done yesterday creatinine was noted to be 6.6. The patient was referred back to our office urgently. He has had little in the way of any flank pain but has had some mild abdominal discomfort and nausea. He feels like urine output has been normal. Repeat creatinine in our office was 6.3. The contralateral left kidney appeared normal without hydronephrosis. Patient's potassium was elevated at 5.9. We felt that he has acute renal failure may be in part secondary to obstruction of the right kidney but would not expect creatinine anywhere near this level due to a unilateral obstruction. We suspect he has an additional cause for his acute renal failure. This may be a result of anti-inflammatory use. A nephrology consult has been called. The patient now presents for stent placement to decompress the right kidney and hopefully improve overall systemic renal function.    Technique: Patient was brought the operating room where he had successful induction general anesthesia. Placed in lithotomy position prepped and draped in usual manner. Appropriate surgical timeout was performed. Cystoscopy revealed unremarkable anterior and prostatic urethra. Bladder was endoscopically normal.  Grade pyelography was done with an open-ended catheter. Fluoroscopic interpretation real time. At a proximal ureter 4-5 cm beneath the ureteropelvic junction a large filling defect was noted consistent with his 10 mm stone. High-grade obstruction was appreciated.   Guidewire was placed to the open-ended catheter into the renal pelvis. We then placed a 7 French 24 cm double-J stent without difficulty. Good positioning confirmed fluoroscopically as well as visually. Patient. Tolerated the procedure well had no obvious complications. He is brought to recovery room stable condition.

## 2014-01-06 NOTE — Consult Note (Signed)
Reason for Consult:AKI, hyperkalemia Referring Physician: Risa Grill, MD  Jeffrey Holmes is an 63 y.o. male.  HPI: Jeffrey Holmes is a 63yo AAM with PMH sig for HTN, hypercholesterolemia, nephrolithiasis, and CKD (baseline Scr 1.2-1.5) who presented 2 weeks ago with right flank pain and noted to have multiple bilateral stones with right hydro.  He was then scheduled for an MRI but pre-test labs revealed a Scr of 6.6.  He was brought in to [facility] and seen by Dr. Risa Grill who repeated the labs and noted a K of 5.9 and Scr of 6.3.  I do not have BUN or CO2 available for review.  He underwent double-J-stent placement today by Dr. Risa Grill who asked Jeffrey Holmes to help evaluate and manage his AKI/CKD that is out of proportion to a unilateral hydro.  Of note, Mr. Metzen reports 2 weeks of N/V/anorexia and decreased po intake while also taking ibuprofen 800mg  and toradol (although only 1 or 2 doses) and is also on lotrel (ACE-Inhibitor).  Trend in Creatinine: Creatinine, Ser  Date/Time Value Ref Range Status  09/18/2009 12:36 AM 1.20  0.4 - 1.5 mg/dL Final    PMH:   Past Medical History  Diagnosis Date  . Hypertension   . Hypercholesteremia   . Chronic kidney disease     multiple kidney stones  . H/O hiatal hernia     PSH:   Past Surgical History  Procedure Laterality Date  . Back surgery  2006    microdisectomy  . Lithotripsy      2-3 times in past    Allergies:  Allergies  Allergen Reactions  . Hydrocodone Nausea And Vomiting  . Oxycontin [Oxycodone Hcl] Nausea And Vomiting    Medications:   Prior to Admission medications   Medication Sig Start Date End Date Taking? Authorizing Provider  amLODipine-benazepril (LOTREL) 10-20 MG per capsule Take 1 capsule by mouth every morning.   Yes Historical Provider, MD  ibuprofen (ADVIL,MOTRIN) 800 MG tablet Take 800 mg by mouth every 8 (eight) hours as needed. For back pain   Yes Historical Provider, MD  ketorolac (TORADOL) 10 MG tablet Take 10 mg by mouth every  6 (six) hours as needed for moderate pain.   Yes Historical Provider, MD  potassium citrate (UROCIT-K) 10 MEQ (1080 MG) SR tablet Take 10 mEq by mouth daily.   Yes Historical Provider, MD  promethazine (PHENERGAN) 25 MG tablet Take 25 mg by mouth every 6 (six) hours as needed for nausea or vomiting.   Yes Historical Provider, MD  travoprost, benzalkonium, (TRAVATAN) 0.004 % ophthalmic solution Place 1 drop into both eyes at bedtime.   Yes Historical Provider, MD    Inpatient medications:   Discontinued Meds:   Medications Discontinued During This Encounter  Medication Reason  . 0.9 % irrigation (POUR BTL) Patient Discharge  . iohexol (OMNIPAQUE) 350 MG/ML injection Patient Discharge  . opium-belladonna (B&O SUPPRETTES) suppository Patient Discharge  . sodium chloride irrigation 0.9 % Patient Discharge  . lactated ringers infusion Patient Transfer  . fentaNYL (SUBLIMAZE) injection 25-50 mcg Patient Transfer  . lactated ringers infusion Patient Transfer    Social History:  reports that he has never smoked. He has never used smokeless tobacco. He reports that he drinks alcohol. He reports that he does not use illicit drugs.  Family History:  History reviewed. No pertinent family history.  A comprehensive review of systems was negative except for: Constitutional: positive for anorexia, fatigue and malaise Gastrointestinal: positive for nausea and vomiting Genitourinary: positive  for right flank pain Weight change:   Intake/Output Summary (Last 24 hours) at 01/06/14 1649 Last data filed at 01/06/14 1546  Gross per 24 hour  Intake    300 ml  Output      0 ml  Net    300 ml   BP 132/80  Pulse 68  Temp(Src) 98 F (36.7 C) (Oral)  Resp 21  Ht 5\' 5"  (1.651 m)  Wt 73.029 kg (161 lb)  BMI 26.79 kg/m2  SpO2 100% Filed Vitals:   01/06/14 1600 01/06/14 1615 01/06/14 1630 01/06/14 1640  BP: 98/62 105/75 132/80   Pulse: 71 75 67 68  Temp:      TempSrc:      Resp: 14 19 18 21    Height:      Weight:      SpO2: 99% 93% 99% 100%     General appearance: alert, cooperative, fatigued and slowed mentation Head: Normocephalic, without obvious abnormality, atraumatic Eyes: negative findings: lids and lashes normal, conjunctivae and sclerae normal and corneas clear Neck: no adenopathy, no carotid bruit, no JVD, supple, symmetrical, trachea midline and thyroid not enlarged, symmetric, no tenderness/mass/nodules Resp: clear to auscultation bilaterally Cardio: regular rate and rhythm, S1, S2 normal, no murmur, click, rub or gallop GI: soft, non-tender; bowel sounds normal; no masses,  no organomegaly Extremities: extremities normal, atraumatic, no cyanosis or edema  Labs: Basic Metabolic Panel: No results found for this basename: NA, K, CL, CO2, GLUCOSE, BUN, CREATININE, ALBUMIN, CALCIUM, PHOS,  in the last 168 hours Liver Function Tests: No results found for this basename: AST, ALT, ALKPHOS, BILITOT, PROT, ALBUMIN,  in the last 168 hours No results found for this basename: LIPASE, AMYLASE,  in the last 168 hours No results found for this basename: AMMONIA,  in the last 168 hours CBC: No results found for this basename: WBC, NEUTROABS, HGB, HCT, MCV, PLT,  in the last 168 hours PT/INR: @LABRCNTIP (inr:5) Cardiac Enzymes: )No results found for this basename: CKTOTAL, CKMB, CKMBINDEX, TROPONINI,  in the last 168 hours CBG: No results found for this basename: GLUCAP,  in the last 168 hours  Iron Studies: No results found for this basename: IRON, TIBC, TRANSFERRIN, FERRITIN,  in the last 168 hours  Xrays/Other Studies: Dg Chest 2 View  01/06/2014   CLINICAL DATA:  Preop cystoscopy  EXAM: CHEST  2 VIEW  COMPARISON:  04/24/2004  FINDINGS: The heart size and mediastinal contours are within normal limits. Both lungs are clear. The visualized skeletal structures are unremarkable.  IMPRESSION: No active cardiopulmonary disease.   Electronically Signed   By: Franchot Gallo M.D.    On: 01/06/2014 14:27   Mr Abdomen Wo Contrast  01/05/2014   CLINICAL DATA:  Evaluate splenic lesion on CT  EXAM: MRI ABDOMEN WITHOUT CONTRAST  TECHNIQUE: Multiplanar multisequence MR imaging was performed without the administration of intravenous contrast.  COMPARISON:  Unenhanced CT abdomen pelvis dated 12/22/2013  FINDINGS: Moderate geographic hepatic steatosis. Scattered hepatic cysts, measuring up to 1.7 x 1.5 cm in the anterior segment right hepatic lobe (series 8/ image 15).  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  Pancreas and adrenal glands are within normal limits.  Gallbladder is underdistended. No intrahepatic or extrahepatic ductal dilatation.  Multiple bilateral renal cysts, including:  --2.5 x 2.3 cm posterior right upper pole renal cyst (series 8/ image 10)  --  2.1 x 2.1 cm irregular posterior interpolar right renal cyst (series 8/image 16)  --1.9 x 1.9 cm lateral left lower pole renal cyst (series 8/image 19)  Numerous additional probable bilateral renal cysts. At least three nonobstructing bilateral renal calculi (series 8/ image 16, 17, and 19). Additional calculi on CT are not well visualized on MRI. Moderate right hydronephrosis. Obstructing 8 mm mid right ureteral calculus (series 3/image 9).  No abdominal ascites.  No suspicious abdominal lymphadenopathy.  No focal osseous lesions.  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  8 mm obstructing mid right ureteral calculus with moderate right hydronephrosis. Additional nonobstructing bilateral renal calculi, better visualized on CT.  Numerous bilateral renal cysts, as above.  Moderate hepatic steatosis.   Electronically Signed   By: Julian Hy M.D.   On: 01/05/2014 16:52     Assessment/Plan: 1.  AKI/CKD- in setting of right hydro, volume depletion,  NSAID use and concomitant ACE- inhibition.  Most likely hemodyamically mediated ischemic ATN +/- NSAID nephropathy and/or papillary necrosis.   1. Stop ACE and NSAIDs/Cox-II I's. 2. Follow UOP and daily Scr and K 3. Cont with IVF's but may need to change to isotonic bicarb depending upon CO2 level   2. Hyperkalemia- as above.  Will change IVF's to bicarb for now and review labs 3. Urinary obstruction due to nephrolithiasis- s/p double-J stent.   1. Follow UOP and Scr.   2. Lithotripsy per Urology 3. May need to check bladder scan if no UOP to r/o bladder outlet obstruction following cysto 4. HTN- hold ACE and cont with amlodipine and follow BP's 5. Nephrolithiasis- IV bicarb, hold K-citra for now 6. Dispo- per urology.   Sahithi Ordoyne A 01/06/2014, 4:49 PM

## 2014-01-07 ENCOUNTER — Inpatient Hospital Stay (HOSPITAL_COMMUNITY): Payer: Federal, State, Local not specified - PPO

## 2014-01-07 LAB — BASIC METABOLIC PANEL
BUN: 72 mg/dL — AB (ref 6–23)
CHLORIDE: 102 meq/L (ref 96–112)
CO2: 19 mEq/L (ref 19–32)
Calcium: 9.9 mg/dL (ref 8.4–10.5)
Creatinine, Ser: 6.49 mg/dL — ABNORMAL HIGH (ref 0.50–1.35)
GFR calc non Af Amer: 8 mL/min — ABNORMAL LOW (ref >90)
GFR, EST AFRICAN AMERICAN: 9 mL/min — AB (ref >90)
Glucose, Bld: 117 mg/dL — ABNORMAL HIGH (ref 70–99)
POTASSIUM: 6.3 meq/L — AB (ref 3.7–5.3)
Sodium: 138 mEq/L (ref 137–147)

## 2014-01-07 LAB — RENAL FUNCTION PANEL
ALBUMIN: 3.9 g/dL (ref 3.5–5.2)
BUN: 72 mg/dL — ABNORMAL HIGH (ref 6–23)
CALCIUM: 9.9 mg/dL (ref 8.4–10.5)
CO2: 19 mEq/L (ref 19–32)
CREATININE: 6.54 mg/dL — AB (ref 0.50–1.35)
Chloride: 103 mEq/L (ref 96–112)
GFR calc Af Amer: 9 mL/min — ABNORMAL LOW (ref >90)
GFR, EST NON AFRICAN AMERICAN: 8 mL/min — AB (ref >90)
Glucose, Bld: 117 mg/dL — ABNORMAL HIGH (ref 70–99)
Phosphorus: 5.4 mg/dL — ABNORMAL HIGH (ref 2.3–4.6)
Potassium: 6.3 mEq/L — ABNORMAL HIGH (ref 3.7–5.3)
Sodium: 139 mEq/L (ref 137–147)

## 2014-01-07 LAB — CBC
HCT: 40.8 % (ref 39.0–52.0)
HEMOGLOBIN: 13.9 g/dL (ref 13.0–17.0)
MCH: 30.5 pg (ref 26.0–34.0)
MCHC: 34.1 g/dL (ref 30.0–36.0)
MCV: 89.5 fL (ref 78.0–100.0)
Platelets: 461 10*3/uL — ABNORMAL HIGH (ref 150–400)
RBC: 4.56 MIL/uL (ref 4.22–5.81)
RDW: 13.2 % (ref 11.5–15.5)
WBC: 10.6 10*3/uL — AB (ref 4.0–10.5)

## 2014-01-07 LAB — CREATININE, URINE, RANDOM: Creatinine, Urine: 94 mg/dL

## 2014-01-07 LAB — SODIUM, URINE, RANDOM: Sodium, Ur: 64 mEq/L

## 2014-01-07 MED ORDER — TAMSULOSIN HCL 0.4 MG PO CAPS
0.4000 mg | ORAL_CAPSULE | Freq: Every morning | ORAL | Status: DC
  Administered 2014-01-08 – 2014-01-09 (×2): 0.4 mg via ORAL
  Filled 2014-01-07 (×3): qty 1

## 2014-01-07 MED ORDER — TAMSULOSIN HCL 0.4 MG PO CAPS
0.4000 mg | ORAL_CAPSULE | Freq: Once | ORAL | Status: DC
  Filled 2014-01-07: qty 1

## 2014-01-07 MED ORDER — STERILE WATER FOR INJECTION IV SOLN
150.0000 meq | INTRAVENOUS | Status: DC
  Administered 2014-01-07 – 2014-01-08 (×2): 150 meq via INTRAVENOUS
  Filled 2014-01-07 (×4): qty 850

## 2014-01-07 MED ORDER — FUROSEMIDE 10 MG/ML IJ SOLN
120.0000 mg | Freq: Once | INTRAVENOUS | Status: AC
  Administered 2014-01-07: 120 mg via INTRAVENOUS
  Filled 2014-01-07: qty 12

## 2014-01-07 MED ORDER — PROMETHAZINE HCL 25 MG/ML IJ SOLN
25.0000 mg | Freq: Four times a day (QID) | INTRAMUSCULAR | Status: DC | PRN
  Administered 2014-01-07: 25 mg via INTRAVENOUS
  Filled 2014-01-07: qty 1

## 2014-01-07 MED ORDER — SODIUM POLYSTYRENE SULFONATE 15 GM/60ML PO SUSP
30.0000 g | Freq: Once | ORAL | Status: AC
  Administered 2014-01-07: 30 g via ORAL
  Filled 2014-01-07: qty 120

## 2014-01-07 NOTE — Care Management Note (Unsigned)
    Page 1 of 1   01/07/2014     1:45:57 PM CARE MANAGEMENT NOTE 01/07/2014  Patient:  Jeffrey Holmes, Jeffrey Holmes   Account Number:  [id number]  Date Initiated:  01/07/2014  Documentation initiated by:  [facility]  Subjective/Objective Assessment:   63 year old male admitted with ARF.     Action/Plan:   From home.   Anticipated DC Date:  01/10/2014   Anticipated DC Plan:  [place]  CM consult      Choice offered to / List presented to:             Status of service:  Completed, signed off Medicare Important Message given?   (If response is "NO", the following Medicare IM given date fields will be blank) Date Medicare IM given:   Date Additional Medicare IM given:    Discharge Disposition:    Per UR Regulation:  Reviewed for med. necessity/level of care/duration of stay  If discussed at [facility] of Stay Meetings, dates discussed:    Comments:

## 2014-01-07 NOTE — Progress Notes (Signed)
INITIAL NUTRITION ASSESSMENT  DOCUMENTATION CODES Per approved criteria  -Not Applicable   INTERVENTION: -Educated pt on renal diet-provided nutrition education handouts regarding Low K/Low Phos foods -Will continue to monitor   NUTRITION DIAGNOSIS: Inadequate oral intake related to nausea as evidenced by poor PO for one week.   Goal: Pt to meet >/= 90% of their estimated nutrition needs    Monitor:  Total protein/energy intake, labs, renal profile, diet education needs, weights, GI profile  Reason for Assessment: MST  63 y.o. male  Admitting Dx: <principal problem not specified>  ASSESSMENT: Mr Pourciau presents today as an urgent walk-in with concerns about acute renal failure  -Pt reported decreased appetite for past 2 weeks d/t nausea.  -Diet recall indicates pt typically consumes 2 meals/day, typically spaghetti, pasta or rice dishes. Has been eating smaller portions d/t nausea -Endorsed an unintentional wt loss of 6 lbs in past 2 weeks. -Has been eating 50% of meals during admit d/t nausea and current renal diet restriction -Elevated K/Phos.Provided pt potassium and phosphorus nutrition education handouts from [employer]. Encouraged intake of low K/Phos foods. Reviewed 1200 ml fluid restriction  -Reviewed nutrition therapy for nausea -Pt s/p double J-stent. Crt remains elevated. Will monitor renal labs and f/u with education needs  Height: Ht Readings from Last 1 Encounters:  01/06/14 4\' 7"  (1.397 m)  6/11  5\' 5"   Weight: Wt Readings from Last 1 Encounters:  01/06/14 161 lb (73.029 kg)    Ideal Body Weight: 136 lbs  % Ideal Body Weight: 118%  Wt Readings from Last 10 Encounters:  01/06/14 161 lb (73.029 kg)  01/06/14 161 lb (73.029 kg)  04/16/12 169 lb 2 oz (76.715 kg)  04/16/12 169 lb 2 oz (76.715 kg)  02/17/12 171 lb (77.565 kg)  02/17/12 171 lb (77.565 kg)    Usual Body Weight: 167 lbs  % Usual Body Weight: 96%  BMI:  Body  mass index is 37.42 kg/(m^2).  Estimated Nutritional Needs: Kcal: 1800-2000 Protein: 60-75 gram Fluid: 1200 ml per MD  Skin: WDL  Diet Order: Renal  EDUCATION NEEDS: -Education needs addressed   Intake/Output Summary (Last 24 hours) at 01/07/14 1814 Last data filed at 01/07/14 1800  Gross per 24 hour  Intake   2492 ml  Output   1677 ml  Net    815 ml    Last BM: 6/10   Labs:   Recent Labs Lab 01/06/14 1722 01/07/14 0400 01/07/14 0416  NA 139 138 139  K 6.4* 6.3* 6.3*  CL 104 102 103  CO2 17* 19 19  BUN 73* 72* 72*  CREATININE 7.11*  6.79* 6.49* 6.54*  CALCIUM 10.1 9.9 9.9  PHOS 5.1*  --  5.4*  GLUCOSE 105* 117* 117*    CBG (last 3)  No results found for this basename: GLUCAP,  in the last 72 hours  Scheduled Meds: . amLODipine  10 mg Oral Daily  . heparin subcutaneous  5,000 Units Subcutaneous 3 times per day  . latanoprost  1 drop Both Eyes QHS  . tamsulosin  0.4 mg Oral Once  . [START ON 01/08/2014] tamsulosin  0.4 mg Oral q morning - 10a  . tamsulosin  0.4 mg Oral Once    Continuous Infusions: .  sodium bicarbonate 150 mEq in sterile water 1000 mL infusion 150 mEq (01/07/14 1442)    Past Medical History  Diagnosis Date  . Hypertension   . Hypercholesteremia   . Chronic kidney disease  multiple kidney stones  . H/O hiatal hernia     Past Surgical History  Procedure Laterality Date  . Back surgery  2006    microdisectomy  . Lithotripsy      2-3 times in past    Atlee Abide [place] LDN Clinical Dietitian [id number]

## 2014-01-07 NOTE — Progress Notes (Signed)
Patient is still having episodes of emesis. Patient did not think that he would be able to keep down any oral medications so for now the patient is not taking oral medications.  RN to notify the MD on call for an order for IV medication.

## 2014-01-07 NOTE — Progress Notes (Signed)
Patient complaining of 9/10 pain after having a bowel movement. Patient bent over at sink holding left side flank. Patient stated, "this is what happens when I have a kidney stone". Pain medication given. Patient has vomited twice. VS stable and afebrile. MD was called and notified. New orders received. Will continue to monitor patient. Setzer, Marchelle Folks

## 2014-01-07 NOTE — Progress Notes (Signed)
1 Day Post-Op Subjective: Patient Has tolerated his double-J stent well.  He is having reasonably good urinary output and no difficulty with voiding.  Unfortunately creatinine has not improved significantly.  Potassium continues to be significantly elevated.  Appreciate nephrology assistance.  Patient appears to have renal failure primarily on a nonobstructive basis  Objective: Vital signs in last 24 hours: Temp:  [97.2 F (36.2 C)-98.4 F (36.9 C)] 98.4 F (36.9 C) (06/12 0622) Pulse Rate:  [61-77] 65 (06/12 0622) Resp:  [12-21] 18 (06/12 0622) BP: (98-150)/(56-90) 129/74 mmHg (06/12 0622) SpO2:  [93 %-100 %] 99 % (06/12 0622) FiO2 (%):  [2 %] 2 % (06/11 1817) Weight:  [161 lb (73.029 kg)] 161 lb (73.029 kg) (06/11 1817)  Intake/Output from previous day: 06/11 0701 - 06/12 0700 In: 1530 [P.O.:920; I.V.:610] Out: 1025 [Urine:1025] Intake/Output this shift:    Physical Exam:  Constitutional: Vital signs reviewed. WD WN in NAD   Eyes: PERRL, No scleral icterus.   Cardiovascular: RRR Pulmonary/Chest: Normal effort Abdominal: Soft. Non-tender Genitourinary:No change Extremities: No cyanosis or edema   Lab Results:  Recent Labs  01/06/14 1722 01/07/14 0400  HGB 14.4 13.9  HCT 42.2 40.8   BMET  Recent Labs  01/07/14 0400 01/07/14 0416  NA 138 139  K 6.3* 6.3*  CL 102 103  CO2 19 19  GLUCOSE 117* 117*  BUN 72* 72*  CREATININE 6.49* 6.54*  CALCIUM 9.9 9.9   No results found for this basename: LABPT, INR,  in the last 72 hours No results found for this basename: LABURIN,  in the last 72 hours No results found for this or any previous visit.  Studies/Results: Dg Chest 2 View  01/06/2014   CLINICAL DATA:  Preop cystoscopy  EXAM: CHEST  2 VIEW  COMPARISON:  04/24/2004  FINDINGS: The heart size and mediastinal contours are within normal limits. Both lungs are clear. The visualized skeletal structures are unremarkable.  IMPRESSION: No active cardiopulmonary disease.    Electronically Signed   By: Franchot Gallo M.D.   On: 01/06/2014 14:27   Mr Abdomen Wo Contrast  01/05/2014   CLINICAL DATA:  Evaluate splenic lesion on CT  EXAM: MRI ABDOMEN WITHOUT CONTRAST  TECHNIQUE: Multiplanar multisequence MR imaging was performed without the administration of intravenous contrast.  COMPARISON:  Unenhanced CT abdomen pelvis dated 12/22/2013  FINDINGS: Moderate geographic hepatic steatosis. Scattered hepatic cysts, measuring up to 1.7 x 1.5 cm in the anterior segment right hepatic lobe (series 8/ image 15).  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  Pancreas and adrenal glands are within normal limits.  Gallbladder is underdistended. No intrahepatic or extrahepatic ductal dilatation.  Multiple bilateral renal cysts, including:  --2.5 x 2.3 cm posterior right upper pole renal cyst (series 8/ image 10)  --2.1 x 2.1 cm irregular posterior interpolar right renal cyst (series 8/image 16)  --1.9 x 1.9 cm lateral left lower pole renal cyst (series 8/image 19)  Numerous additional probable bilateral renal cysts. At least three nonobstructing bilateral renal calculi (series 8/ image 16, 17, and 19). Additional calculi on CT are not well visualized on MRI. Moderate right hydronephrosis. Obstructing 8 mm mid right ureteral calculus (series 3/image 9).  No abdominal ascites.  No suspicious abdominal lymphadenopathy.  No focal osseous lesions.  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  8  mm obstructing mid right ureteral calculus with moderate right hydronephrosis. Additional nonobstructing bilateral renal calculi, better visualized on CT.  Numerous bilateral renal cysts, as above.  Moderate hepatic steatosis.   Electronically Signed   By: Julian Hy M.D.   On: 01/05/2014 16:52    Assessment/Plan:   Acute  renal failure.[name]  Hopefully will be some improvement in creatinine over the next 24-48 hours Status post stent Placement. It appears that his renal failure is primarily nonobstructive in nature. He will continue to need nephrology Input for ongoing management. We'll currently leave him on the schedule for ESWL on Monday but that may need to be postponed.   LOS: 1 day   Jeffrey Holmes S 01/07/2014, 7:49 AM

## 2014-01-07 NOTE — Progress Notes (Signed)
S: Did not keep down the kayexalate yest.  Nausea better today. O:BP 129/74  Pulse 65  Temp(Src) 98.4 F (36.9 C) (Oral)  Resp 18  Ht 4\' 7"  (1.397 m)  Wt 73.029 kg (161 lb)  BMI 37.42 kg/m2  SpO2 99%  Intake/Output Summary (Last 24 hours) at 01/07/14 1344 Last data filed at 01/07/14 1317  Gross per 24 hour  Intake   2150 ml  Output   1725 ml  Net    425 ml   Weight change:  IN:[id number] and alert CVS:RRR Resp:clear Abd:+ BS NTND No HSM Ext:no edema NEURO:CNI Ox3 no asterixis   . amLODipine  10 mg Oral Daily  . heparin subcutaneous  5,000 Units Subcutaneous 3 times per day  . latanoprost  1 drop Both Eyes QHS   Dg Chest 2 View  01/06/2014   CLINICAL DATA:  Preop cystoscopy  EXAM: CHEST  2 VIEW  COMPARISON:  04/24/2004  FINDINGS: The heart size and mediastinal contours are within normal limits. Both lungs are clear. The visualized skeletal structures are unremarkable.  IMPRESSION: No active cardiopulmonary disease.   Electronically Signed   By: Franchot Gallo M.D.   On: 01/06/2014 14:27   Mr Abdomen Wo Contrast  01/05/2014   CLINICAL DATA:  Evaluate splenic lesion on CT  EXAM: MRI ABDOMEN WITHOUT CONTRAST  TECHNIQUE: Multiplanar multisequence MR imaging was performed without the administration of intravenous contrast.  COMPARISON:  Unenhanced CT abdomen pelvis dated 12/22/2013  FINDINGS: Moderate geographic hepatic steatosis. Scattered hepatic cysts, measuring up to 1.7 x 1.5 cm in the anterior segment right hepatic lobe (series 8/ image 15).  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  Pancreas and adrenal glands are within normal limits.  Gallbladder is underdistended. No intrahepatic or extrahepatic ductal dilatation.  Multiple bilateral renal cysts, including:  --2.5 x 2.3 cm posterior right upper pole renal cyst (series 8/ image 10)   --2.1 x 2.1 cm irregular posterior interpolar right renal cyst (series 8/image 16)  --1.9 x 1.9 cm lateral left lower pole renal cyst (series 8/image 19)  Numerous additional probable bilateral renal cysts. At least three nonobstructing bilateral renal calculi (series 8/ image 16, 17, and 19). Additional calculi on CT are not well visualized on MRI. Moderate right hydronephrosis. Obstructing 8 mm mid right ureteral calculus (series 3/image 9).  No abdominal ascites.  No suspicious abdominal lymphadenopathy.  No focal osseous lesions.  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  8 mm obstructing mid right ureteral calculus with moderate right hydronephrosis. Additional nonobstructing bilateral renal calculi, better visualized on CT.  Numerous bilateral renal cysts, as above.  Moderate hepatic steatosis.   Electronically Signed   By: Julian Hy M.D.   On: 01/05/2014 16:52   BMET    Component Value Date/Time   NA 139 01/07/2014 0416   K 6.3* 01/07/2014 0416   CL 103 01/07/2014 0416   CO2 19 01/07/2014 0416   GLUCOSE 117* 01/07/2014 0416   BUN 72* 01/07/2014 0416   CREATININE 6.54* 01/07/2014 0416   CALCIUM 9.9 01/07/2014 0416   GFRNONAA 8* 01/07/2014 0416   GFRAA 9* 01/07/2014 0416   CBC    Component Value Date/Time   WBC 10.6* 01/07/2014 0400   RBC 4.56 01/07/2014 0400   HGB 13.9 01/07/2014 0400   HCT 40.8 01/07/2014 0400  PLT 461* 01/07/2014 0400   MCV 89.5 01/07/2014 0400   MCH 30.5 01/07/2014 0400   MCHC 34.1 01/07/2014 0400   RDW 13.2 01/07/2014 0400   LYMPHSABS 0.4* 09/18/2009 0036   MONOABS 0.4 09/18/2009 0036   EOSABS 0.0 09/18/2009 0036   BASOSABS 0.0 09/18/2009 0036     Assessment: 1. Acute on CKD 3 multifactorial in origin  obstructed Rt ureter, volume depletion on ACE, NSAID use.  Scr not improved though UO is good 2. Hyperkalemia 3. Nephrolithiasis 4. HTN 5. Metabolic acidosis  Plan: 1. Kayexalate 30gm now.  Will also increase IV and give IV  lasix to increase K excretion renal 2. Telemetry until K improved 3.  Cont IV bicarb 4. Daily Scr.  Hopefully in time renal fx will improve, at least Scr is not rising   Jeffrey Holmes T

## 2014-01-08 LAB — RENAL FUNCTION PANEL
Albumin: 3.8 g/dL (ref 3.5–5.2)
BUN: 68 mg/dL — ABNORMAL HIGH (ref 6–23)
CALCIUM: 9.4 mg/dL (ref 8.4–10.5)
CO2: 33 mEq/L — ABNORMAL HIGH (ref 19–32)
Chloride: 94 mEq/L — ABNORMAL LOW (ref 96–112)
Creatinine, Ser: 5.47 mg/dL — ABNORMAL HIGH (ref 0.50–1.35)
GFR calc Af Amer: 12 mL/min — ABNORMAL LOW (ref >90)
GFR, EST NON AFRICAN AMERICAN: 10 mL/min — AB (ref >90)
Glucose, Bld: 113 mg/dL — ABNORMAL HIGH (ref 70–99)
Phosphorus: 7.4 mg/dL — ABNORMAL HIGH (ref 2.3–4.6)
Potassium: 4.2 mEq/L (ref 3.7–5.3)
Sodium: 142 mEq/L (ref 137–147)

## 2014-01-08 NOTE — Progress Notes (Signed)
Patient reported that he passed two kidney stones last pm. This am he urinated in urinal and called RN to report third stone in urine. Urine strained by RN and stone saved in container for MD. Dr. Karsten Ro notified of situation. Pt. Does not c/o pain at this time after passing stones. No new orders received from MD. Will continue to monitor patient. Jeffrey Holmes, Margaretmary Eddy

## 2014-01-08 NOTE — Progress Notes (Signed)
S: Feeling better despite having Lt flank pain and passing a kidney stone O:BP 123/74  Pulse 93  Temp(Src) 98.2 F (36.8 C) (Oral)  Resp 22  Ht 4\' 7"  (1.397 m)  Wt 73.029 kg (161 lb)  BMI 37.42 kg/m2  SpO2 97%  Intake/Output Summary (Last 24 hours) at 01/08/14 1119 Last data filed at 01/08/14 0900  Gross per 24 hour  Intake 2593.66 ml  Output   2552 ml  Net  41.66 ml   Weight change:  [id number] and alert CVS:RRR Resp:clear Abd:+ BS NTND No HSM  No CVA tenderness Ext:no edema NEURO:CNI Ox3 no asterixis   . amLODipine  10 mg Oral Daily  . heparin subcutaneous  5,000 Units Subcutaneous 3 times per day  . latanoprost  1 drop Both Eyes QHS  . tamsulosin  0.4 mg Oral Once  . tamsulosin  0.4 mg Oral q morning - 10a  . tamsulosin  0.4 mg Oral Once   Dg Chest 2 View  01/06/2014   CLINICAL DATA:  Preop cystoscopy  EXAM: CHEST  2 VIEW  COMPARISON:  04/24/2004  FINDINGS: The heart size and mediastinal contours are within normal limits. Both lungs are clear. The visualized skeletal structures are unremarkable.  IMPRESSION: No active cardiopulmonary disease.   Electronically Signed   By: Franchot Gallo M.D.   On: 01/06/2014 14:27   Dg Abd 1 View  01/07/2014   CLINICAL DATA:  Vomiting for 30 min.  History of hiatal hernia.  EXAM: ABDOMEN - 1 VIEW  COMPARISON:  Abdominal MRI 01/05/2014.  CT 12/22/2013.  FINDINGS: Double-J right ureteral stent appears well positioned. Multiple calcifications are seen over the kidneys bilaterally. There are no obvious ureteral calculi. The bowel gas pattern appears nonobstructive. There is some stool within the right colon. The upper abdomen is incompletely visualized. Telemetry leads overlie the chest and upper abdomen.  IMPRESSION: No acute abdominal findings identified. Right ureteral stent appears well positioned. Bilateral nephrolithiasis again noted.   Electronically Signed   By: Camie Patience M.D.   On: 01/07/2014 18:13   BMET    Component Value  Date/Time   NA 142 01/08/2014 0449   K 4.2 01/08/2014 0449   CL 94* 01/08/2014 0449   CO2 33* 01/08/2014 0449   GLUCOSE 113* 01/08/2014 0449   BUN 68* 01/08/2014 0449   CREATININE 5.47* 01/08/2014 0449   CALCIUM 9.4 01/08/2014 0449   GFRNONAA 10* 01/08/2014 0449   GFRAA 12* 01/08/2014 0449   CBC    Component Value Date/Time   WBC 10.6* 01/07/2014 0400   RBC 4.56 01/07/2014 0400   HGB 13.9 01/07/2014 0400   HCT 40.8 01/07/2014 0400   PLT 461* 01/07/2014 0400   MCV 89.5 01/07/2014 0400   MCH 30.5 01/07/2014 0400   MCHC 34.1 01/07/2014 0400   RDW 13.2 01/07/2014 0400   LYMPHSABS 0.4* 09/18/2009 0036   MONOABS 0.4 09/18/2009 0036   EOSABS 0.0 09/18/2009 0036   BASOSABS 0.0 09/18/2009 0036     Assessment: 1. Acute on CKD 3 multifactorial in origin  obstructed Rt ureter, volume depletion on ACE, NSAID use.  Scr sl improved and UO good 2. Hyperkalemia, resolved 3. Nephrolithiasis, passed stone last night 4. HTN 5. Metabolic acidosis, improved  Plan: 1. DC bicarb gtt 2. DC fluid restriction 3. Daily Scr  Jeffrey Holmes T

## 2014-01-08 NOTE — Progress Notes (Signed)
2 Days Post-Op Subjective: Patient reported yesterday having developed pain on the left-hand side. He has known stones in his left kidney. Today he said he is not having any pain whatsoever. He did have nausea and vomiting overnight do to the suggestion of Kayexalate.  Objective: Vital signs in last 24 hours: Temp:  [97.4 F (36.3 C)-98.2 F (36.8 C)] 98.2 F (36.8 C) (06/13 0627) Pulse Rate:  [76-93] 93 (06/13 0627) Resp:  [18-22] 22 (06/13 0627) BP: (127-141)/(71-82) 141/81 mmHg (06/13 0627) SpO2:  [97 %-100 %] 97 % (06/13 0627)  Intake/Output from previous day: 06/12 0701 - 06/13 0700 In: 2882 [P.O.:540; I.V.:2280; IV Piggyback:62] Out: 2502 [Urine:2500; Emesis/NG output:1; Stool:1] Intake/Output this shift: Total I/O In: 1401.7 [P.O.:300; I.V.:1101.7] Out: 1450 [Urine:1450]  Physical Exam:  No CVAT  Lab Results:  Recent Labs  01/06/14 1722 01/07/14 0400  HGB 14.4 13.9  HCT 42.2 40.8   BMET  Recent Labs  01/07/14 0416 01/08/14 0449  NA 139 142  K 6.3* 4.2  CL 103 94*  CO2 19 33*  GLUCOSE 117* 113*  BUN 72* 68*  CREATININE 6.54* 5.47*  CALCIUM 9.9 9.4   No results found for this basename: LABPT, INR,  in the last 72 hours No results found for this basename: LABURIN,  in the last 72 hours No results found for this or any previous visit.  Studies/Results: Dg Chest 2 View  01/06/2014   CLINICAL DATA:  Preop cystoscopy  EXAM: CHEST  2 VIEW  COMPARISON:  04/24/2004  FINDINGS: The heart size and mediastinal contours are within normal limits. Both lungs are clear. The visualized skeletal structures are unremarkable.  IMPRESSION: No active cardiopulmonary disease.   Electronically Signed   By: Franchot Gallo M.D.   On: 01/06/2014 14:27   Dg Abd 1 View  01/07/2014   CLINICAL DATA:  Vomiting for 30 min.  History of hiatal hernia.  EXAM: ABDOMEN - 1 VIEW  COMPARISON:  Abdominal MRI 01/05/2014.  CT 12/22/2013.  FINDINGS: Double-J right ureteral stent appears well  positioned. Multiple calcifications are seen over the kidneys bilaterally. There are no obvious ureteral calculi. The bowel gas pattern appears nonobstructive. There is some stool within the right colon. The upper abdomen is incompletely visualized. Telemetry leads overlie the chest and upper abdomen.  IMPRESSION: No acute abdominal findings identified. Right ureteral stent appears well positioned. Bilateral nephrolithiasis again noted.   Electronically Signed   By: Camie Patience M.D.   On: 01/07/2014 18:13    Assessment/Plan: 1. Right ureteral calculus with obstruction - he had a right ureteral stent placed an obstructing his right kidney however his creatinine remains elevated consistent with possible ATN. He is being evaluated by nephrology. This is greatly appreciated. I have discussed with the patient the fact that if he is medically stable he could potentially undergo his lithotripsy on Monday however that determination will be made tomorrow.  2. Left-sided pain - his left-sided pain was fairly severe yesterday so I obtained a KUB. This revealed no change in his left renal calculi with no stone seen along the course of the ureter. His pain is now resolved. It may have been GI related especially since he has been ingesting Kayexalate recently.     LOS: 2 days   Lawson Isabell C 01/08/2014, 6:44 AM

## 2014-01-09 ENCOUNTER — Inpatient Hospital Stay (HOSPITAL_COMMUNITY): Payer: Federal, State, Local not specified - PPO

## 2014-01-09 LAB — RENAL FUNCTION PANEL
ALBUMIN: 3.7 g/dL (ref 3.5–5.2)
BUN: 61 mg/dL — ABNORMAL HIGH (ref 6–23)
CO2: 34 mEq/L — ABNORMAL HIGH (ref 19–32)
CREATININE: 4.69 mg/dL — AB (ref 0.50–1.35)
Calcium: 9.4 mg/dL (ref 8.4–10.5)
Chloride: 93 mEq/L — ABNORMAL LOW (ref 96–112)
GFR calc Af Amer: 14 mL/min — ABNORMAL LOW (ref >90)
GFR calc non Af Amer: 12 mL/min — ABNORMAL LOW (ref >90)
Glucose, Bld: 134 mg/dL — ABNORMAL HIGH (ref 70–99)
PHOSPHORUS: 5.6 mg/dL — AB (ref 2.3–4.6)
Potassium: 3.7 mEq/L (ref 3.7–5.3)
SODIUM: 143 meq/L (ref 137–147)

## 2014-01-09 NOTE — Progress Notes (Signed)
Patient ID: Jeffrey Holmes, male   DOB: 1951/02/02, 63 y.o.   MRN: [id number] 3 Days Post-Op Subjective: Patient is without complaint this morning. He passed 3 stones yesterday. He was able to retrieve one. He said it was his opinion that the stones had passed from the left-hand side since he has passed multiple stones in the past. I think that would most likely be the case since he did have produced significant pain on the left-hand side for a short duration Friday night.  Objective: Vital signs in last 24 hours: Temp:  [97.9 F (36.6 C)-98.5 F (36.9 C)] 97.9 F (36.6 C) (06/14 0636) Pulse Rate:  [80-94] 80 (06/14 0636) Resp:  [16-20] 20 (06/14 0636) BP: (122-151)/(72-85) 141/85 mmHg (06/14 0636) SpO2:  [97 %-100 %] 97 % (06/14 0636)  Intake/Output from previous day: 06/13 0701 - 06/14 0700 In: 1560 [P.O.:1560] Out: 2100 [Urine:2100] Intake/Output this shift:    Past Medical History  Diagnosis Date  . Hypertension   . Hypercholesteremia   . Chronic kidney disease     multiple kidney stones  . H/O hiatal hernia    Current Facility-Administered Medications  Medication Dose Route Frequency Provider Last Rate Last Dose  . acetaminophen (TYLENOL) tablet 650 mg  650 mg Oral Q4H PRN Bernestine Amass, MD      . amLODipine (NORVASC) tablet 10 mg  10 mg Oral Daily Malka So, MD   10 mg at 01/08/14 [id number]  . heparin injection 5,000 Units  5,000 Units Subcutaneous 3 times per day Bernestine Amass, MD   5,000 Units at 01/09/14 0622  . HYDROmorphone (DILAUDID) injection 0.5-1 mg  0.5-1 mg Intravenous Q2H PRN Bernestine Amass, MD   1 mg at 01/07/14 2134  . latanoprost (XALATAN) 0.005 % ophthalmic solution 1 drop  1 drop Both Eyes QHS Malka So, MD   1 drop at 01/08/14 2230  . ondansetron (ZOFRAN) injection 4 mg  4 mg Intravenous Q4H PRN Bernestine Amass, MD   4 mg at 01/07/14 2134  . oxybutynin (DITROPAN) tablet 5 mg  5 mg Oral Q8H PRN Bernestine Amass, MD      . promethazine (PHENERGAN) injection 25  mg  25 mg Intravenous Q6H PRN Claybon Jabs, MD   25 mg at 01/07/14 2305  . tamsulosin (FLOMAX) capsule 0.4 mg  0.4 mg Oral Once Claybon Jabs, MD      . tamsulosin [facility]) capsule 0.4 mg  0.4 mg Oral q morning - 10a Claybon Jabs, MD   0.4 mg at 01/08/14 [id number]  . tamsulosin (FLOMAX) capsule 0.4 mg  0.4 mg Oral Once Claybon Jabs, MD        Physical Exam:  General: Patient is in no apparent distress Lungs: Normal respiratory effort, chest expands symmetrically. GI: The abdomen is soft and nontender without mass.    Lab Results:  Recent Labs  01/06/14 1722 01/07/14 0400  WBC 12.7* 10.6*  HGB 14.4 13.9  HCT 42.2 40.8   BMET  Recent Labs  01/08/14 0449 01/09/14 0444  NA 142 143  K 4.2 3.7  CL 94* 93*  CO2 33* 34*  GLUCOSE 113* 134*  BUN 68* 61*  CREATININE 5.47* 4.69*  CALCIUM 9.4 9.4   No results found for this basename: LABPT, INR,  in the last 72 hours No results found for this basename: LABURIN,  in the last 72 hours No results found for this or any previous visit.  Studies/Results:  No results found.  Assessment/Plan: Right ureteral calculus: I reviewed his KUB that was obtained yesterday and compared with a CT scan done that revealed a fairly large stone located at the L2 vertebra level but could not identify the stone on his plain film. It has Hounsfield units that I measured up about 500 - 600 and may be difficult to visualize for lithotripsy. I have discussed the fact with him today. I do not feel that the stones that he passed yesterday or from the right-hand side especially since that stone was much larger in size than the stone that he retrieved. He said the stones that he did not retrieve were about the same size but were not larger. His creatinine is elevated but his potassium has now normalized. From a urologic standpoint if the stone can be visualized I would think he could undergo lithotripsy tomorrow unless there is some reason that this should not be  performed per nephrology. For now I will repeat his KUB. He is on subcutaneous heparin which I believe will need to be stopped prior to his lithotripsy and then he will also need to have his preoperative medications and enema order but I will hold off until I see his KUB results.  His renal function appears to be improving slowly. Nephrology's assistance is greatly appreciated.  Ledger Heindl C 01/09/2014, 7:39 AM

## 2014-01-09 NOTE — Progress Notes (Signed)
S: Feels well.  No further flank pain O:BP 141/85  Pulse 80  Temp(Src) 97.9 F (36.6 C) (Oral)  Resp 20  Ht 4\' 7"  (1.397 m)  Wt 73.029 kg (161 lb)  BMI 37.42 kg/m2  SpO2 97%  Intake/Output Summary (Last 24 hours) at 01/09/14 1009 Last data filed at 01/09/14 0700  Gross per 24 hour  Intake   1320 ml  Output   1700 ml  Net   -380 ml   Weight change:  [id number] and alert CVS:RRR Resp:clear Abd:+ BS NTND No HSM  No CVA tenderness Ext:no edema NEURO:CNI Ox3 no asterixis   . amLODipine  10 mg Oral Daily  . heparin subcutaneous  5,000 Units Subcutaneous 3 times per day  . latanoprost  1 drop Both Eyes QHS  . tamsulosin  0.4 mg Oral Once  . tamsulosin  0.4 mg Oral q morning - 10a  . tamsulosin  0.4 mg Oral Once   Dg Abd 1 View  01/09/2014   CLINICAL DATA:  Ureteral stent.  EXAM: ABDOMEN - 1 VIEW  COMPARISON:  01/07/2014.  FINDINGS: Soft tissue structures are unremarkable. The gas pattern is nonspecific. Right double-J ureteral stent is present. Bilateral nephrolithiasis. Small calcific stone projected over the right upper ureter cannot be excluded. Pelvic calcifications consistent with phleboliths. Bony structures are unremarkable.  IMPRESSION: Right double-J ureteral stent in good anatomic position. Bilateral nephrolithiasis. Right upper urolithiasis cannot be excluded.   Electronically Signed   By: Marcello Moores  Register   On: 01/09/2014 09:08   Dg Abd 1 View  01/07/2014   CLINICAL DATA:  Vomiting for 30 min.  History of hiatal hernia.  EXAM: ABDOMEN - 1 VIEW  COMPARISON:  Abdominal MRI 01/05/2014.  CT 12/22/2013.  FINDINGS: Double-J right ureteral stent appears well positioned. Multiple calcifications are seen over the kidneys bilaterally. There are no obvious ureteral calculi. The bowel gas pattern appears nonobstructive. There is some stool within the right colon. The upper abdomen is incompletely visualized. Telemetry leads overlie the chest and upper abdomen.  IMPRESSION: No acute  abdominal findings identified. Right ureteral stent appears well positioned. Bilateral nephrolithiasis again noted.   Electronically Signed   By: Camie Patience M.D.   On: 01/07/2014 18:13   BMET    Component Value Date/Time   NA 143 01/09/2014 0444   K 3.7 01/09/2014 0444   CL 93* 01/09/2014 0444   CO2 34* 01/09/2014 0444   GLUCOSE 134* 01/09/2014 0444   BUN 61* 01/09/2014 0444   CREATININE 4.69* 01/09/2014 0444   CALCIUM 9.4 01/09/2014 0444   GFRNONAA 12* 01/09/2014 0444   GFRAA 14* 01/09/2014 0444   CBC    Component Value Date/Time   WBC 10.6* 01/07/2014 0400   RBC 4.56 01/07/2014 0400   HGB 13.9 01/07/2014 0400   HCT 40.8 01/07/2014 0400   PLT 461* 01/07/2014 0400   MCV 89.5 01/07/2014 0400   MCH 30.5 01/07/2014 0400   MCHC 34.1 01/07/2014 0400   RDW 13.2 01/07/2014 0400   LYMPHSABS 0.4* 09/18/2009 0036   MONOABS 0.4 09/18/2009 0036   EOSABS 0.0 09/18/2009 0036   BASOSABS 0.0 09/18/2009 0036     Assessment: 1. Acute on CKD 3 multifactorial in origin  obstructed Rt ureter, volume depletion on ACE, NSAID use.  Scr cont to improve 2. Hyperkalemia, resolved 3. Nephrolithiasis 4. HTN 5. Metabolic acidosis, resolved  Plan: 1. Recheck labs in AM.  If scr lower then could be Dc'd from neph standpoint  2. Change to  2gm sodium diet Jeffrey Holmes T

## 2014-01-10 ENCOUNTER — Ambulatory Visit (HOSPITAL_COMMUNITY)
Admission: RE | Admit: 2014-01-10 | Payer: Federal, State, Local not specified - PPO | Source: Ambulatory Visit | Admitting: Urology

## 2014-01-10 HISTORY — DX: Personal history of other diseases of the digestive system: Z87.19

## 2014-01-10 LAB — PROTEIN ELECTROPHORESIS, SERUM
ALPHA-1-GLOBULIN: 10.4 % — AB (ref 2.9–4.9)
Albumin ELP: 50.2 % — ABNORMAL LOW (ref 55.8–66.1)
Alpha-2-Globulin: 14.6 % — ABNORMAL HIGH (ref 7.1–11.8)
BETA 2: 5.6 % (ref 3.2–6.5)
BETA GLOBULIN: 5.7 % (ref 4.7–7.2)
Gamma Globulin: 13.5 % (ref 11.1–18.8)
M-Spike, %: NOT DETECTED g/dL
TOTAL PROTEIN ELP: 7.5 g/dL (ref 6.0–8.3)

## 2014-01-10 LAB — BASIC METABOLIC PANEL
BUN: 49 mg/dL — ABNORMAL HIGH (ref 6–23)
CO2: 31 mEq/L (ref 19–32)
CREATININE: 3.64 mg/dL — AB (ref 0.50–1.35)
Calcium: 9.2 mg/dL (ref 8.4–10.5)
Chloride: 97 mEq/L (ref 96–112)
GFR, EST AFRICAN AMERICAN: 19 mL/min — AB (ref >90)
GFR, EST NON AFRICAN AMERICAN: 16 mL/min — AB (ref >90)
Glucose, Bld: 114 mg/dL — ABNORMAL HIGH (ref 70–99)
Potassium: 4 mEq/L (ref 3.7–5.3)
Sodium: 143 mEq/L (ref 137–147)

## 2014-01-10 LAB — UIFE/LIGHT CHAINS/TP QN, 24-HR UR
ALBUMIN, U: DETECTED
ALPHA 2 UR: DETECTED — AB
Alpha 1, Urine: DETECTED — AB
Beta, Urine: DETECTED — AB
FREE KAPPA LT CHAINS, UR: 15.9 mg/dL — AB (ref 0.14–2.42)
FREE KAPPA/LAMBDA RATIO: 7.83 ratio (ref 2.04–10.37)
Free Lambda Lt Chains,Ur: 2.03 mg/dL — ABNORMAL HIGH (ref 0.02–0.67)
Gamma Globulin, Urine: DETECTED — AB
TOTAL PROTEIN, URINE-UPE24: 102 mg/dL

## 2014-01-10 LAB — IMMUNOFIXATION ELECTROPHORESIS
IGG (IMMUNOGLOBIN G), SERUM: 1210 mg/dL (ref 650–1600)
IgA: 189 mg/dL (ref 68–379)
IgM, Serum: 83 mg/dL (ref 41–251)
Total Protein ELP: 7.7 g/dL (ref 6.0–8.3)

## 2014-01-10 SURGERY — LITHOTRIPSY, ESWL
Anesthesia: LOCAL | Laterality: Right

## 2014-01-10 MED ORDER — HYDROMORPHONE HCL 2 MG PO TABS: 2.0000 mg | ORAL_TABLET | ORAL | Status: DC | PRN

## 2014-01-10 NOTE — Discharge Summary (Signed)
Physician Discharge Summary  Patient ID: Jeffrey Holmes MRN: [id number] DOB/AGE: March 21, 1951 63 y.o.  Admit date: 01/06/2014 Discharge date: 01/10/2014  Admission Diagnoses:  Acute renal failure  Discharge Diagnoses:  Principal Problem:   Acute renal failure Active Problems:   Hydronephrosis, right   Ureteral calculus   Past Medical History  Diagnosis Date  . Hypertension   . Hypercholesteremia   . Chronic kidney disease     multiple kidney stones  . H/O hiatal hernia     Surgeries: Procedure(s): CYSTOSCOPY WITH RETROGRADE PYELOGRAM, URETEROSCOPY AND STENT PLACEMENT on 01/06/2014   Consultants (if any): Treatment Team:  Donetta Potts, MD  Discharged Condition: Improved  Hospital Course: Jeffrey Holmes is an 63 y.o. male who was admitted 01/06/2014 with a diagnosis of Acute renal failure with a right proximal ureteral stone and obstruction and went to the operating room on 01/06/2014 and underwent the above named procedures.  Nephrology consult was obtained and it was felt that the underlying cause of the ARI was dehydration in the setting of an Ace Inhibitor and NSAID.  He also passed 3 left ureteral stones since admission but an MRI on 6/10 for evaluation of what turned out to be a splenic cyst showed no left hydro but persistent right hydro.  He has done well since admission with a progressive decline in the Cr.  He has no flank pain or irritative voiding symptoms with the stent.  He is felt to be ready for discharge today and will be sent home with a script for hydromorphone for pain.  He will be kept off of NSAID's and Potassium Citrate for the time being.  He was to have ESWL today but that will be rescheduled for next week.  He was given perioperative antibiotics:      Anti-infectives   Start     Dose/Rate Route Frequency Ordered Stop   01/06/14 1321  ceFAZolin (ANCEF) IVPB 2 g/50 mL premix     2 g 100 mL/hr over 30 Minutes Intravenous 30 min pre-op 01/06/14 1321 01/06/14  1521    .  He was given sequential compression devices, early ambulation, and SQ heparin for DVT prophylaxis.  He benefited maximally from the hospital stay and there were no complications.    Recent vital signs:  Filed Vitals:   01/10/14 0552  BP: 144/83  Pulse: 78  Temp: 98.7 F (37.1 C)  Resp: 18    Recent laboratory studies:  Lab Results  Component Value Date   HGB 13.9 01/07/2014   HGB 14.4 01/06/2014   HGB 16.3 09/18/2009   Lab Results  Component Value Date   WBC 10.6* 01/07/2014   PLT 461* 01/07/2014   No results found for this basename: INR   Lab Results  Component Value Date   NA 143 01/10/2014   K 4.0 01/10/2014   CL 97 01/10/2014   CO2 31 01/10/2014   BUN 49* 01/10/2014   CREATININE 3.64* 01/10/2014   GLUCOSE 114* 01/10/2014    Discharge Medications:     Medication List    STOP taking these medications       amLODipine-benazepril 10-20 MG per capsule  Commonly known as:  LOTREL     ibuprofen 800 MG tablet  Commonly known as:  ADVIL,MOTRIN     ketorolac 10 MG tablet  Commonly known as:  TORADOL     potassium citrate 10 MEQ (1080 MG) SR tablet  Commonly known as:  UROCIT-K      TAKE  these medications       HYDROmorphone 2 MG tablet  Commonly known as:  DILAUDID  Take 1 tablet (2 mg total) by mouth every 4 (four) hours as needed for severe pain.     promethazine 25 MG tablet  Commonly known as:  PHENERGAN  Take 25 mg by mouth every 6 (six) hours as needed for nausea or vomiting.     travoprost (benzalkonium) 0.004 % ophthalmic solution  Commonly known as:  TRAVATAN  Place 1 drop into both eyes at bedtime.        Diagnostic Studies: Dg Chest 2 View  01/06/2014   CLINICAL DATA:  Preop cystoscopy  EXAM: CHEST  2 VIEW  COMPARISON:  04/24/2004  FINDINGS: The heart size and mediastinal contours are within normal limits. Both lungs are clear. The visualized skeletal structures are unremarkable.  IMPRESSION: No active cardiopulmonary disease.    Electronically Signed   By: Franchot Gallo M.D.   On: 01/06/2014 14:27   Dg Abd 1 View  01/09/2014   CLINICAL DATA:  Ureteral stent.  EXAM: ABDOMEN - 1 VIEW  COMPARISON:  01/07/2014.  FINDINGS: Soft tissue structures are unremarkable. The gas pattern is nonspecific. Right double-J ureteral stent is present. Bilateral nephrolithiasis. Small calcific stone projected over the right upper ureter cannot be excluded. Pelvic calcifications consistent with phleboliths. Bony structures are unremarkable.  IMPRESSION: Right double-J ureteral stent in good anatomic position. Bilateral nephrolithiasis. Right upper urolithiasis cannot be excluded.   Electronically Signed   By: Marcello Moores  Register   On: 01/09/2014 09:08   Dg Abd 1 View  01/07/2014   CLINICAL DATA:  Vomiting for 30 min.  History of hiatal hernia.  EXAM: ABDOMEN - 1 VIEW  COMPARISON:  Abdominal MRI 01/05/2014.  CT 12/22/2013.  FINDINGS: Double-J right ureteral stent appears well positioned. Multiple calcifications are seen over the kidneys bilaterally. There are no obvious ureteral calculi. The bowel gas pattern appears nonobstructive. There is some stool within the right colon. The upper abdomen is incompletely visualized. Telemetry leads overlie the chest and upper abdomen.  IMPRESSION: No acute abdominal findings identified. Right ureteral stent appears well positioned. Bilateral nephrolithiasis again noted.   Electronically Signed   By: Camie Patience M.D.   On: 01/07/2014 18:13   Mr Abdomen Wo Contrast  01/05/2014   CLINICAL DATA:  Evaluate splenic lesion on CT  EXAM: MRI ABDOMEN WITHOUT CONTRAST  TECHNIQUE: Multiplanar multisequence MR imaging was performed without the administration of intravenous contrast.  COMPARISON:  Unenhanced CT abdomen pelvis dated 12/22/2013  FINDINGS: Moderate geographic hepatic steatosis. Scattered hepatic cysts, measuring up to 1.7 x 1.5 cm in the anterior segment right hepatic lobe (series 8/ image 15).  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  Pancreas and adrenal glands are within normal limits.  Gallbladder is underdistended. No intrahepatic or extrahepatic ductal dilatation.  Multiple bilateral renal cysts, including:  --2.5 x 2.3 cm posterior right upper pole renal cyst (series 8/ image 10)  --2.1 x 2.1 cm irregular posterior interpolar right renal cyst (series 8/image 16)  --1.9 x 1.9 cm lateral left lower pole renal cyst (series 8/image 19)  Numerous additional probable bilateral renal cysts. At least three nonobstructing bilateral renal calculi (series 8/ image 16, 17, and 19). Additional calculi on CT are not well visualized on MRI. Moderate right hydronephrosis. Obstructing 8 mm  mid right ureteral calculus (series 3/image 9).  No abdominal ascites.  No suspicious abdominal lymphadenopathy.  No focal osseous lesions.  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  8 mm obstructing mid right ureteral calculus with moderate right hydronephrosis. Additional nonobstructing bilateral renal calculi, better visualized on CT.  Numerous bilateral renal cysts, as above.  Moderate hepatic steatosis.   Electronically Signed   By: Julian Hy M.D.   On: 01/05/2014 16:52    Disposition: 01-Home or Self Care  He will be taken off of amlodipine and potassium citrate for the time being and the ESWL will be rescheduled.  He was asked to see his PCP this week to adjust his BP meds.    Discharge Instructions   Discontinue IV    Complete by:  As directed            Follow-up Information   Follow up with Malka So, MD. (We will call with the new time for lithotripsy)    Specialty:  Urology   Contact information:   [address] [place] [address] [phone]       Follow up with Salena Saner., MD. (Please contact her office  for an appt this week for adjustment of BP meds. )    Specialty:  Internal Medicine   Contact information:   [address] [place] [address] [phone]        Signed: Malka So 01/10/2014, 8:00 AM

## 2014-01-10 NOTE — Discharge Instructions (Signed)

## 2014-01-11 ENCOUNTER — Other Ambulatory Visit: Payer: Self-pay | Admitting: Urology

## 2014-01-11 ENCOUNTER — Encounter (HOSPITAL_COMMUNITY): Payer: Self-pay | Admitting: Urology

## 2014-01-12 ENCOUNTER — Encounter (HOSPITAL_COMMUNITY): Payer: Self-pay | Admitting: Pharmacy Technician

## 2014-01-12 ENCOUNTER — Encounter (HOSPITAL_COMMUNITY): Payer: Self-pay | Admitting: *Deleted

## 2014-01-12 NOTE — Progress Notes (Signed)
At Pre-Lith[facility] call patient denies any angina chest pain or MI.

## 2014-01-17 ENCOUNTER — Encounter (HOSPITAL_COMMUNITY)
Admission: RE | Disposition: A | Discharge status: Home / Self Care | Payer: Self-pay | Source: Ambulatory Visit | Attending: Urology

## 2014-01-17 ENCOUNTER — Ambulatory Visit (HOSPITAL_COMMUNITY): Payer: Federal, State, Local not specified - PPO

## 2014-01-17 ENCOUNTER — Encounter (HOSPITAL_COMMUNITY): Payer: Self-pay | Admitting: *Deleted

## 2014-01-17 ENCOUNTER — Ambulatory Visit (HOSPITAL_COMMUNITY)
Admission: RE | Admit: 2014-01-17 | Discharge: 2014-01-17 | Disposition: A | Discharge status: Home / Self Care | Payer: Federal, State, Local not specified - PPO | Source: Ambulatory Visit | Attending: Urology | Admitting: Urology

## 2014-01-17 DIAGNOSIS — N201 Calculus of ureter: Secondary | ICD-10-CM | POA: Insufficient documentation

## 2014-01-17 DIAGNOSIS — N200 Calculus of kidney: Secondary | ICD-10-CM | POA: Insufficient documentation

## 2014-01-17 DIAGNOSIS — I10 Essential (primary) hypertension: Secondary | ICD-10-CM | POA: Insufficient documentation

## 2014-01-17 DIAGNOSIS — N289 Disorder of kidney and ureter, unspecified: Secondary | ICD-10-CM | POA: Insufficient documentation

## 2014-01-17 DIAGNOSIS — N1330 Unspecified hydronephrosis: Secondary | ICD-10-CM | POA: Insufficient documentation

## 2014-01-17 DIAGNOSIS — Z885 Allergy status to narcotic agent status: Secondary | ICD-10-CM | POA: Insufficient documentation

## 2014-01-17 DIAGNOSIS — E7800 Pure hypercholesterolemia, unspecified: Secondary | ICD-10-CM | POA: Insufficient documentation

## 2014-01-17 SURGERY — LITHOTRIPSY, ESWL
Anesthesia: LOCAL | Laterality: Right

## 2014-01-17 MED ORDER — DIAZEPAM 5 MG PO TABS
10.0000 mg | ORAL_TABLET | ORAL | Status: AC
  Administered 2014-01-17: 10 mg via ORAL
  Filled 2014-01-17: qty 2

## 2014-01-17 MED ORDER — DEXTROSE-NACL 5-0.45 % IV SOLN
INTRAVENOUS | Status: DC
  Administered 2014-01-17: 07:00:00 via INTRAVENOUS

## 2014-01-17 MED ORDER — SODIUM CHLORIDE 0.9 % IJ SOLN: 3.0000 mL | Freq: Two times a day (BID) | INTRAMUSCULAR | Status: DC

## 2014-01-17 MED ORDER — SODIUM CHLORIDE 0.9 % IJ SOLN: 3.0000 mL | INTRAMUSCULAR | Status: DC | PRN

## 2014-01-17 MED ORDER — CIPROFLOXACIN HCL 500 MG PO TABS
500.0000 mg | ORAL_TABLET | ORAL | Status: AC
  Administered 2014-01-17: 500 mg via ORAL
  Filled 2014-01-17: qty 1

## 2014-01-17 MED ORDER — FENTANYL CITRATE 0.05 MG/ML IJ SOLN: 25.0000 ug | INTRAMUSCULAR | Status: DC | PRN

## 2014-01-17 MED ORDER — CIPROFLOXACIN HCL 250 MG PO TABS: 250.0000 mg | ORAL_TABLET | Freq: Two times a day (BID) | ORAL | Status: DC

## 2014-01-17 MED ORDER — SODIUM CHLORIDE 0.9 % IV SOLN: 250.0000 mL | INTRAVENOUS | Status: DC | PRN

## 2014-01-17 MED ORDER — DIPHENHYDRAMINE HCL 25 MG PO CAPS
25.0000 mg | ORAL_CAPSULE | ORAL | Status: AC
  Administered 2014-01-17: 25 mg via ORAL
  Filled 2014-01-17: qty 1

## 2014-01-17 NOTE — Discharge Instructions (Signed)
Please follow [facility] Discharge instructions  Lithotripsy, Care After Refer to this sheet in the next few weeks. These instructions provide you with information on caring for yourself after your procedure. Your health care provider may also give you more specific instructions. Your treatment has been planned according to current medical practices, but problems sometimes occur. Call your health care provider if you have any problems or questions after your procedure. WHAT TO EXPECT AFTER THE PROCEDURE   Your urine may have a red tinge for a few days after treatment. Blood loss is usually minimal.  You may have soreness in the back or flank area. This usually goes away after a few days. The procedure can cause blotches or bruises on the back where the pressure wave enters the skin. These marks usually cause only minimal discomfort and should disappear in a short time.  Stone fragments should begin to pass within 24 hours of treatment. However, a delayed passage is not unusual.  You may have pain, discomfort, and feel sick to your stomach (nauseated) when the crushed fragments of stone are passed down the tube from the kidney to the bladder. Stone fragments can pass soon after the procedure and may last for up to 4-8 weeks.  A small number of patients may have severe pain when stone fragments are not able to pass, which leads to an obstruction.  If your stone is greater than 1 inch (2.5 cm) in diameter or if you have multiple stones that have a combined diameter greater than 1 inch (2.5 cm), you may require more than one treatment.  If you had a stent placed prior to your procedure, you may experience some discomfort, especially during urination. You may experience the pain or discomfort in your flank or back, or you may experience a sharp pain or discomfort at the base of your penis or in your lower abdomen. The discomfort usually lasts only a few minutes after urinating. HOME CARE  INSTRUCTIONS   Rest at home until you feel your energy improving.  Only take over-the-counter or prescription medicines for pain, discomfort, or fever as directed by your health care provider. Depending on the type of lithotripsy, you may need to take antibiotics and anti-inflammatory medicines for a few days.  Drink enough water and fluids to keep your urine clear or pale yellow. This helps "flush" your kidneys. It helps pass any remaining pieces of stone and prevents stones from coming back.  Most people can resume daily activities within 1-2 days after standard lithotripsy. It can take longer to recover from laser and percutaneous lithotripsy.  If the stones are in your urinary system, you may be asked to strain your urine at home to look for stones. Any stones that are found can be sent to a medical lab for examination.  Visit your health care provider for a follow-up appointment in a few weeks. Your doctor may remove your stent if you have one. Your health care provider will also check to see whether stone particles still remain. SEEK MEDICAL CARE IF:   Your pain is not relieved by medicine.  You have a lasting nauseous feeling.  You feel there is too much blood in the urine.  You develop persistent problems with frequent or painful urination that does not at least partially improve after 2 days following the procedure.  You have a congested cough.  You feel lightheaded.  You develop a rash or any other signs that might suggest an allergic problem.  You  develop any reaction or side effects to your medicine(s). SEEK IMMEDIATE MEDICAL CARE IF:   You experience severe back or flank pain or both.  You see nothing but blood when you urinate.  You cannot pass any urine at all.  You have a fever or shaking chills.  You develop shortness of breath, difficulty breathing, or chest pain.  You develop vomiting that will not stop after 6-8 hours.  You have a fainting  episode. Document Released: 08/04/2007 Document Revised: 05/05/2013 Document Reviewed: 01/28/2013 [facility] Patient Information 2015 [place], [place]. This information is not intended to replace advice given to you by your health care provider. Make sure you discuss any questions you have with your health care provider.

## 2014-01-17 NOTE — Interval H&P Note (Signed)
History and Physical Interval Note:  He has had a right ureteral stent placed and his Cr fell to under 4 prior to hospital discharge.   He has some intermittent hematuria from the stent.  The stone is visible along the proximal stent today.  01/17/2014 7:40 AM  Jeffrey Holmes  has presented today for surgery, with the diagnosis of right proximal stone  The various methods of treatment have been discussed with the patient and family. After consideration of risks, benefits and other options for treatment, the patient has consented to  Procedure(s): RIGHT EXTRACORPOREAL SHOCK WAVE LITHOTRIPSY (ESWL) (Right) as a surgical intervention .  The patient's history has been reviewed, patient examined, no change in status, stable for surgery.  I have reviewed the patient's chart and labs.  Questions were answered to the patient's satisfaction.     [name] J

## 2014-01-17 NOTE — H&P (View-Only) (Signed)
History of Present Illness   Jeffrey Holmes presents today as an urgent walk-in with concerns about acute renal failure. Jeffrey Holmes is currently 63 years of age. He has been a patient of Dr Ralene Muskrat and prior to that, Dr Leory Plowman. More recently he saw Dr Jasmine December. I have reviewed previous notes as well as recent imaging. He was thought to have a proximal ureteral stone dating back to March. Initially read as potentially 6 mm in size. A formal stone protocol CT at the end of May showed what appeared to be more like a 1 cm stone in the proximal right ureter/UPJ region causing at least moderate hydronephrosis. Creatinine at that time was 1.5 and there was no definitive indication for urgent intervention. Dr Jeffrey Holmes got Jeffrey Holmes to agree to ESWL and that has been arranged for early next week. The patient recently underwent MRI Imaging to check a splenic abnormality. Apparently a creatinine was drawn prior to that study, which was 6.6. I do not see BUN or potassium levels anywhere in the [facility] system. Because of his markedly elevated creatinine, he was asked to come in here on a more urgent basis. He last ate or drank anything around 7 o'clock this morning. His overall clinical situation has not changed substantially. He has had some on and off mild abdominal discomfort and nausea. He tells me for years he was a heavy user of Ibuprofen but has reduced his nonsteroidal antiinflammatory intake. He has been taking some on and off oral Toradol but not in any large quantities. Urinalysis today does show some ongoing microhematuria, no evidence of active sediment, any evidence of infection. When the patient was here several days ago to see Dr Jasmine December, there had been a question as to possibly having some fevers and Dr Jasmine December thought a stent should be considered. Jeffrey Holmes elected not to proceed with stent placement at that time. He has had a STAT BMET drawn and we are awaiting those results. He clinically otherwise has had no change in voiding  patterns.      Past Medical History Problems  1. History of Calculus of left ureter (592.1) 2. History of Calculus of ureter (592.1) 3. History of esophageal reflux (V12.79) 4. History of hypercholesterolemia (V12.29) 5. History of hypertension (V12.59) 6. History of kidney stones (V13.01) 7. History of Hydronephrosis, left (591) 8. History of Pyuria (791.9)  Surgical History Problems  1. History of Ant Spinal Diskect Osteophytect Lumb Interspace Microdiscect 2. History of Lithotripsy 3. History of Lithotripsy  Current Meds 1. Amlodipine-Atorvastatin 10-20 MG Oral Tablet;  Therapy: [id number] to Recorded 2. Cephalexin 500 MG Oral Capsule; TAKE 1 CAPSULE 4 times daily;  Therapy: [phone] to (Evaluate:15Jun2015)  Requested for: [phone]; Last  Rx:08Jun2015 Ordered 3. Cialis 20 MG Oral Tablet; TAKE 1 TABLET As Directed PRN;  Therapy: 20Feb2009 to (Last Rx:22Sep2014)  Requested for: 22Sep2014 Ordered 4. Omega-3-acid Ethyl Esters 1 GM Oral Capsule;  Therapy: [id number] to Recorded 5. Ondansetron HCl - 4 MG Oral Tablet; TAKE 1 TABLET Every 4 hours PRN nausea;  Therapy: [phone] to ([id number])  Requested for: [phone]; Last  Rx:08Jun2015 Ordered 6. Potassium Citrate ER 15 MEQ (1620 MG) Oral Tablet Extended Release; Take 1 tablet  twice daily;  Therapy: [id number] to (Last Rx:08Apr2015)  Requested for: 08Apr2015 Ordered 7. TraMADol HCl - 50 MG Oral Tablet; TAKE 1 TO 2 TABLETS EVERY 4 HOURS AS NEEDED  FOR PAIN;  Therapy: [id number] to (Evaluate:15Jun2015); Last Rx:08Jun2015 Ordered  Allergies Medication  1. Codeine Derivatives 2. Hydrocodone-Acetaminophen  CAPS 3. OxyCONTIN TB12 4. Percocet TABS  Family History Problems  1. Family history of Death In The Family Father : Father   Throat CancerAge 31 2. Family history of Death In The Family Mother   Renal FailureAge 18 3. Family history of Family Health Status Number Of Children   One daughter 56. Family history  of Laryngeal Cancer (V16.2) : Father 5. Family history of Renal Failure : Mother  Social History Problems  1. Denied: Alcohol Use 2. Caffeine Use   2-3 per day 3. Marital History - Currently Married 4. 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 Denied: Tobacco Use  Review of Systems Genitourinary, constitutional, skin, eye, otolaryngeal, hematologic/lymphatic, cardiovascular, pulmonary, endocrine, musculoskeletal, gastrointestinal, neurological and psychiatric system(s) were reviewed and pertinent findings if present are noted.  Gastrointestinal: nausea, but no abdominal pain.  Constitutional: feeling tired (fatigue).    Vitals Vital Signs [Data Includes: Last 1 Day]  Recorded: [id number] 10:21AM  Blood Pressure: 135 / 82 Temperature: 98.5 F Heart Rate: 79  Physical Exam Constitutional: Well nourished and well developed . No acute distress.  ENT:. The ears and nose are normal in appearance.  Neck: The appearance of the neck is normal and no neck mass is present.  Pulmonary: No respiratory distress and normal respiratory rhythm and effort.  Cardiovascular: Heart rate and rhythm are normal . No peripheral edema.  Abdomen: The abdomen is soft and nontender. No masses are palpated. No CVA tenderness. No hernias are palpable. No hepatosplenomegaly noted.  Skin: Normal skin turgor, no visible rash and no visible skin lesions.  Neuro/Psych:. Mood and affect are appropriate.    Results/Data Urine [Data Includes: Last 1 Day]   [id number]  COLOR YELLOW   APPEARANCE CLEAR   SPECIFIC GRAVITY 1.025   pH 5.5   GLUCOSE NEG mg/dL  BILIRUBIN NEG   KETONE NEG mg/dL  BLOOD MOD   PROTEIN TRACE mg/dL  UROBILINOGEN 0.2 mg/dL  NITRITE NEG   LEUKOCYTE ESTERASE NEG   SQUAMOUS EPITHELIAL/HPF RARE   WBC 0-2 WBC/hpf  RBC 11-20 RBC/hpf  BACTERIA NONE SEEN   CRYSTALS NONE SEEN   CASTS NONE SEEN   Other MUCUS    Assessment Assessed  1. Hydronephrosis, right (591) 2. Bilateral  kidney stones (592.0) 3. Calculus of right ureter (592.1) 4. Renal insufficiency (593.9)  Plan  Health Maintenance  1. UA With REFLEX; [Do Not Release]; Status:Complete;   [id number] 10:03AM Hydronephrosis, right, Renal insufficiency  2. BUN & CREATININE; Status:Canceled - Specimen/Data Collection,Appointment;  Right flank pain  3. KUB; Status:Hold For - Date of Service; Requested [id number];  4. Follow-up Office  Follow-up  Status: Hold For - Date of Service  Requested for:  29Jun2015  Discussion/Summary Jeffrey. Jeffrey Holmes had a stat metabolic panel performed. Creatinine is markedly elevated at 6.3. Potassium is 5.9. This patient probably has an obstructive component to his renal failure without not explain a deterioration in his renal function to this degree. Some of this may be related to nonsteroidal anti-inflammatory use. We will call consultation to the nephrology service admit the patient has a direct admission. We'll plan on double-J stent placement of the right kidney sometime later today. Open without we'll at least improve his renal function although I suspect he will not normalized. The patient's definitive ESWL may need to be postponed pending on how he does over the next 48-72 hours.   Signatures Electronically signed by : Rana Snare, M.D.; Jan 06 2014  2:47PM EST

## 2014-12-16 ENCOUNTER — Other Ambulatory Visit: Payer: Self-pay | Admitting: Orthopedic Surgery

## 2014-12-16 DIAGNOSIS — M25511 Pain in right shoulder: Secondary | ICD-10-CM

## 2014-12-29 ENCOUNTER — Other Ambulatory Visit: Payer: Federal, State, Local not specified - PPO

## 2015-01-12 ENCOUNTER — Ambulatory Visit
Admission: RE | Admit: 2015-01-12 | Discharge: 2015-01-12 | Disposition: A | Discharge status: Home / Self Care | Payer: Federal, State, Local not specified - PPO | Source: Ambulatory Visit | Attending: Orthopedic Surgery | Admitting: Orthopedic Surgery

## 2015-01-12 DIAGNOSIS — M75101 Unspecified rotator cuff tear or rupture of right shoulder, not specified as traumatic: Secondary | ICD-10-CM | POA: Diagnosis not present

## 2015-01-12 DIAGNOSIS — M19011 Primary osteoarthritis, right shoulder: Secondary | ICD-10-CM | POA: Diagnosis not present

## 2015-01-12 DIAGNOSIS — M7521 Bicipital tendinitis, right shoulder: Secondary | ICD-10-CM | POA: Diagnosis not present

## 2015-01-12 DIAGNOSIS — M25511 Pain in right shoulder: Secondary | ICD-10-CM

## 2015-01-12 DIAGNOSIS — M7581 Other shoulder lesions, right shoulder: Secondary | ICD-10-CM | POA: Diagnosis not present

## 2015-03-16 ENCOUNTER — Ambulatory Visit (HOSPITAL_BASED_OUTPATIENT_CLINIC_OR_DEPARTMENT_OTHER): Admission: RE | Admit: 2015-03-16 | Payer: Medicare Other | Source: Ambulatory Visit | Admitting: Orthopedic Surgery

## 2015-03-16 ENCOUNTER — Encounter (HOSPITAL_BASED_OUTPATIENT_CLINIC_OR_DEPARTMENT_OTHER): Admission: RE | Payer: Self-pay | Source: Ambulatory Visit

## 2015-03-16 SURGERY — SHOULDER ARTHROSCOPY WITH SUBACROMIAL DECOMPRESSION, ROTATOR CUFF REPAIR AND BICEP TENDON REPAIR
Anesthesia: General | Site: Shoulder | Laterality: Right

## 2015-07-20 DIAGNOSIS — I10 Essential (primary) hypertension: Secondary | ICD-10-CM | POA: Diagnosis not present

## 2015-07-20 DIAGNOSIS — Z79899 Other long term (current) drug therapy: Secondary | ICD-10-CM | POA: Diagnosis not present

## 2015-07-20 DIAGNOSIS — R7309 Other abnormal glucose: Secondary | ICD-10-CM | POA: Diagnosis not present

## 2015-07-20 DIAGNOSIS — E782 Mixed hyperlipidemia: Secondary | ICD-10-CM | POA: Diagnosis not present

## 2015-07-20 DIAGNOSIS — N178 Other acute kidney failure: Secondary | ICD-10-CM | POA: Diagnosis not present

## 2015-07-20 DIAGNOSIS — N183 Chronic kidney disease, stage 3 (moderate): Secondary | ICD-10-CM | POA: Diagnosis not present

## 2015-08-08 ENCOUNTER — Ambulatory Visit (INDEPENDENT_AMBULATORY_CARE_PROVIDER_SITE_OTHER): Payer: Medicare Other | Admitting: Cardiology

## 2015-08-08 ENCOUNTER — Encounter: Payer: Self-pay | Admitting: Cardiology

## 2015-08-08 VITALS — BP 150/84 | HR 90 | Ht 65.0 in | Wt 192.6 lb

## 2015-08-08 DIAGNOSIS — R0602 Shortness of breath: Secondary | ICD-10-CM | POA: Diagnosis not present

## 2015-08-08 DIAGNOSIS — I10 Essential (primary) hypertension: Secondary | ICD-10-CM

## 2015-08-08 DIAGNOSIS — R079 Chest pain, unspecified: Secondary | ICD-10-CM | POA: Diagnosis not present

## 2015-08-08 DIAGNOSIS — E785 Hyperlipidemia, unspecified: Secondary | ICD-10-CM

## 2015-08-08 DIAGNOSIS — I208 Other forms of angina pectoris: Secondary | ICD-10-CM | POA: Insufficient documentation

## 2015-08-08 HISTORY — DX: Hyperlipidemia, unspecified: E78.5

## 2015-08-08 HISTORY — DX: Essential (primary) hypertension: I10

## 2015-08-08 MED ORDER — ATORVASTATIN CALCIUM 40 MG PO TABS: 40.0000 mg | ORAL_TABLET | Freq: Every day | ORAL | Status: DC

## 2015-08-08 MED ORDER — METOPROLOL SUCCINATE ER 25 MG PO TB24: 25.0000 mg | ORAL_TABLET | Freq: Every day | ORAL | Status: DC

## 2015-08-08 MED ORDER — NITROGLYCERIN 0.4 MG SL SUBL: 0.4000 mg | SUBLINGUAL_TABLET | SUBLINGUAL | Status: DC | PRN

## 2015-08-08 NOTE — Progress Notes (Signed)
Cardiology Office Note   Date:  08/08/2015   ID:  Jeffrey Holmes, DOB 1951/06/14, MRN [id number]  PCP:  Jeffrey Pina, FNP    Chief Complaint  Patient presents with  . Chest Pain  . Shortness of Breath      History of Present Illness: Jeffrey Holmes is a 65 y.o. male who presents for evaluation of chest pain and SOB.   He says that at the end of the summer he was exercising with no problems.  Around November he noticed that he started having DOE as well as a dull pain on the left side of his chest.   He says that he notices the chest discomfort only with physical exertion.  It is over the left upper chest with no radiation.  He denies any diaphoresis or nausea with the pain.  It usually lasts until he stops exerting himself.  He denies any rest pain.  He denies any LE edema, dizziness, palpitations, claudication or syncope.  He has a history of HTN, dyslipidemia and CKD.  He also has a history of hiatal hernia.  He has a history of kidney stones and was found back in the summer to be in ARF secondary to dehydration and ACE I and Lotrel was stopped.  He recently had a very intensive lab panel done which showed a total chol of 337, TAGs 461, LDL 227, HDL 42 and elevated Lpa.      Past Medical History  Diagnosis Date  . Hypertension   . Hypercholesteremia   . Chronic kidney disease     multiple kidney stones  . H/O hiatal hernia   . Benign essential HTN 08/08/2015  . Hyperlipidemia 08/08/2015    Past Surgical History  Procedure Laterality Date  . Back surgery  2006    microdisectomy  . Lithotripsy      2-3 times in past  . Cystoscopy with retrograde pyelogram, ureteroscopy and stent placement Right 01/06/2014    Procedure: CYSTOSCOPY WITH RETROGRADE PYELOGRAM, URETEROSCOPY AND STENT PLACEMENT;  Surgeon: Bernestine Amass, MD;  Location: [facility];  Service: Urology;  Laterality: Right;     Current Outpatient Prescriptions  Medication Sig Dispense Refill  . amLODipine  (NORVASC) 10 MG tablet Take 10 mg by mouth daily.    . timolol (TIMOPTIC) 0.5 % ophthalmic solution     . travoprost, benzalkonium, (TRAVATAN) 0.004 % ophthalmic solution Place 1 drop into both eyes at bedtime.    . valsartan (DIOVAN) 160 MG tablet      No current facility-administered medications for this visit.    Allergies:   Vicodin; Hydrocodone; Oxycontin; and Percocet    Social History:  The patient  reports that he has never smoked. He has never used smokeless tobacco. He reports that he drinks alcohol. He reports that he does not use illicit drugs.   Family History:  The patient's family history includes Kidney failure in his mother; Throat cancer in his father.    ROS:  Please see the history of present illness.   Otherwise, review of systems are positive for none.   All other systems are reviewed and negative.    PHYSICAL EXAM: VS:  BP 150/84 mmHg  Pulse 90  Ht 5\' 5"  (1.651 m)  Wt 192 lb 9.6 oz (87.363 kg)  BMI 32.05 kg/m2 , BMI Body mass index is 32.05 kg/(m^2). GEN: Well nourished, well developed, in  no acute distress HEENT: normal Neck: no JVD, carotid bruits, or masses Cardiac: RRR; no murmurs, rubs,no edema.  S4 gallop is present Respiratory:  clear to auscultation bilaterally, normal work of breathing GI: soft, nontender, nondistended, + BS MS: no deformity or atrophy Skin: warm and dry, no rash Neuro:  Strength and sensation are intact Psych: euthymic mood, full affect   EKG:  EKG is ordered today. The ekg ordered today demonstrates NSR at 91bpm with nonspecific T wave abnormality   Recent Labs: No results found for requested labs within last 365 days.    Lipid Panel No results found for: CHOL, TRIG, HDL, CHOLHDL, VLDL, LDLCALC, LDLDIRECT    Wt Readings from Last 3 Encounters:  08/08/15 192 lb 9.6 oz (87.363 kg)  01/17/14 163 lb 4 oz (74.05 kg)  01/06/14 161 lb (73.029 kg)       ASSESSMENT AND PLAN:  1.  Chest pain with nonspecific T wave  abnormality on EKG.  I am concerned that his CP may represent angina especially in light of his marked hyperlipidemia and HTN.  I have recommended starting ASA 81mg  daily as well as statin and BB.  I will give him a prescription for SL NTG for CP.  I will get a stress myoview to assess for ischemia.   2.  HTN  - borderline controlled.  I hear an S4 gallop on exam.  Will check a 2D echo.  Continue amlodipine and Valsartan and start Toprol XL 25mg  daily.   3.  Dyslipidemia - LDL is markedly elevated at 227.  Start Lipitor 40mg  daily and recheck lipids in 6 weeks.      Current medicines are reviewed at length with the patient today.  The patient does not have concerns regarding medicines.  The following changes have been made:  no change  Labs/ tests ordered today: See above Assessment and Plan No orders of the defined types were placed in this encounter.     Disposition:   FU with me in 4 weeks  Signed, Sueanne Margarita, MD  08/08/2015 4:04 PM    [facility] [place], [place], [place]  [address] Phone: [phone]; Fax: [phone]

## 2015-08-08 NOTE — Patient Instructions (Signed)
Medication Instructions:  Your physician has recommended you make the following change in your medication:  1) START LIPITOR 40 mg daily 2) START TOPROL XL 25 mg daily 2) TAKE NITROGLYCERIN AS NEEDED  Labwork: Your physician recommends that you return for FASTING lab work in: 6 weeks.   Testing/Procedures: Your physician has requested that you have an echocardiogram. Echocardiography is a painless test that uses sound waves to create images of your heart. It provides your doctor with information about the size and shape of your heart and how well your heart's chambers and valves are working. This procedure takes approximately one hour. There are no restrictions for this procedure.   Dr. Radford Pax recommends you have a NUCLEAR STRESS TEST.  Follow-Up: Your physician recommends that you schedule a follow-up appointment in 4 weeks with a PA or NP.  Any Other Special Instructions Will Be Listed Below (If Applicable).     If you need a refill on your cardiac medications before your next appointment, please call your pharmacy.

## 2015-08-10 ENCOUNTER — Telehealth (HOSPITAL_COMMUNITY): Payer: Self-pay | Admitting: *Deleted

## 2015-08-10 ENCOUNTER — Ambulatory Visit: Payer: Medicare Other | Admitting: Cardiology

## 2015-08-10 NOTE — Telephone Encounter (Signed)
Left message on voicemail in reference to upcoming appointment scheduled for 08/15/15. Phone number given for a call back so details instructions can be given. Hubbard Robinson, RN

## 2015-08-10 NOTE — Telephone Encounter (Signed)
Patient given detailed instructions per Myocardial Perfusion Study Information Sheet for the test on 08/15/15 at 715. Patient notified to arrive 15 minutes early and that it is imperative to arrive on time for appointment to keep from having the test rescheduled.  If you need to cancel or reschedule your appointment, please call the office within 24 hours of your appointment. Failure to do so may result in a cancellation of your appointment, and a $50 no show fee. Patient verbalized understanding.Hubbard Robinson, RN

## 2015-08-15 ENCOUNTER — Ambulatory Visit (HOSPITAL_BASED_OUTPATIENT_CLINIC_OR_DEPARTMENT_OTHER): Payer: Medicare Other

## 2015-08-15 DIAGNOSIS — I10 Essential (primary) hypertension: Secondary | ICD-10-CM | POA: Insufficient documentation

## 2015-08-15 DIAGNOSIS — R079 Chest pain, unspecified: Secondary | ICD-10-CM

## 2015-08-15 DIAGNOSIS — R0609 Other forms of dyspnea: Secondary | ICD-10-CM | POA: Insufficient documentation

## 2015-08-15 DIAGNOSIS — R0602 Shortness of breath: Secondary | ICD-10-CM | POA: Diagnosis not present

## 2015-08-15 DIAGNOSIS — R9439 Abnormal result of other cardiovascular function study: Secondary | ICD-10-CM | POA: Insufficient documentation

## 2015-08-15 HISTORY — PX: CARDIOVASCULAR STRESS TEST: SHX262

## 2015-08-15 LAB — MYOCARDIAL PERFUSION IMAGING
CHL CUP MPHR: 155 {beats}/min
CHL CUP NUCLEAR SDS: 4
CHL CUP NUCLEAR SSS: 10
CSEPEW: 10.1 METS
CSEPPHR: 144 {beats}/min
Exercise duration (min): 9 min
Exercise duration (sec): 0 s
LHR: 0.32
LV dias vol: 83 mL
LVSYSVOL: 37 mL
Percent HR: 92 %
Rest HR: 63 {beats}/min
SRS: 6
TID: 0.99

## 2015-08-15 MED ORDER — TECHNETIUM TC 99M SESTAMIBI GENERIC - CARDIOLITE
10.7000 | Freq: Once | INTRAVENOUS | Status: AC | PRN
  Administered 2015-08-15: 11 via INTRAVENOUS

## 2015-08-15 MED ORDER — TECHNETIUM TC 99M SESTAMIBI GENERIC - CARDIOLITE
32.7000 | Freq: Once | INTRAVENOUS | Status: AC | PRN
  Administered 2015-08-15: 32.7 via INTRAVENOUS

## 2015-08-16 ENCOUNTER — Encounter: Payer: Self-pay | Admitting: Cardiology

## 2015-08-16 ENCOUNTER — Telehealth: Payer: Self-pay

## 2015-08-16 ENCOUNTER — Other Ambulatory Visit (INDEPENDENT_AMBULATORY_CARE_PROVIDER_SITE_OTHER): Payer: Medicare Other | Admitting: *Deleted

## 2015-08-16 ENCOUNTER — Telehealth: Payer: Self-pay | Admitting: Cardiology

## 2015-08-16 ENCOUNTER — Ambulatory Visit (INDEPENDENT_AMBULATORY_CARE_PROVIDER_SITE_OTHER): Payer: Federal, State, Local not specified - PPO | Admitting: Cardiology

## 2015-08-16 VITALS — BP 148/76 | HR 66 | Ht 65.0 in | Wt 189.0 lb

## 2015-08-16 DIAGNOSIS — I059 Rheumatic mitral valve disease, unspecified: Secondary | ICD-10-CM | POA: Diagnosis not present

## 2015-08-16 DIAGNOSIS — N183 Chronic kidney disease, stage 3 unspecified: Secondary | ICD-10-CM

## 2015-08-16 DIAGNOSIS — N189 Chronic kidney disease, unspecified: Secondary | ICD-10-CM

## 2015-08-16 DIAGNOSIS — R079 Chest pain, unspecified: Secondary | ICD-10-CM

## 2015-08-16 DIAGNOSIS — R0602 Shortness of breath: Secondary | ICD-10-CM | POA: Diagnosis not present

## 2015-08-16 DIAGNOSIS — Z01812 Encounter for preprocedural laboratory examination: Secondary | ICD-10-CM | POA: Diagnosis not present

## 2015-08-16 DIAGNOSIS — I2510 Atherosclerotic heart disease of native coronary artery without angina pectoris: Secondary | ICD-10-CM | POA: Diagnosis not present

## 2015-08-16 DIAGNOSIS — N179 Acute kidney failure, unspecified: Secondary | ICD-10-CM

## 2015-08-16 DIAGNOSIS — N2889 Other specified disorders of kidney and ureter: Secondary | ICD-10-CM | POA: Insufficient documentation

## 2015-08-16 DIAGNOSIS — I350 Nonrheumatic aortic (valve) stenosis: Secondary | ICD-10-CM | POA: Diagnosis not present

## 2015-08-16 DIAGNOSIS — R9439 Abnormal result of other cardiovascular function study: Secondary | ICD-10-CM

## 2015-08-16 DIAGNOSIS — I351 Nonrheumatic aortic (valve) insufficiency: Secondary | ICD-10-CM | POA: Diagnosis not present

## 2015-08-16 DIAGNOSIS — R9430 Abnormal result of cardiovascular function study, unspecified: Secondary | ICD-10-CM

## 2015-08-16 DIAGNOSIS — I10 Essential (primary) hypertension: Secondary | ICD-10-CM

## 2015-08-16 DIAGNOSIS — I208 Other forms of angina pectoris: Secondary | ICD-10-CM

## 2015-08-16 DIAGNOSIS — E785 Hyperlipidemia, unspecified: Secondary | ICD-10-CM

## 2015-08-16 LAB — CBC WITH DIFFERENTIAL/PLATELET
BASOS ABS: 0.1 10*3/uL (ref 0.0–0.1)
Basophils Relative: 1 % (ref 0–1)
Eosinophils Absolute: 0.2 10*3/uL (ref 0.0–0.7)
Eosinophils Relative: 3 % (ref 0–5)
HCT: 46 % (ref 39.0–52.0)
Hemoglobin: 15.7 g/dL (ref 13.0–17.0)
LYMPHS ABS: 2.5 10*3/uL (ref 0.7–4.0)
LYMPHS PCT: 30 % (ref 12–46)
MCH: 31 pg (ref 26.0–34.0)
MCHC: 34.1 g/dL (ref 30.0–36.0)
MCV: 90.7 fL (ref 78.0–100.0)
MONOS PCT: 10 % (ref 3–12)
MPV: 10.2 fL (ref 8.6–12.4)
Monocytes Absolute: 0.8 10*3/uL (ref 0.1–1.0)
NEUTROS ABS: 4.6 10*3/uL (ref 1.7–7.7)
NEUTROS PCT: 56 % (ref 43–77)
PLATELETS: 235 10*3/uL (ref 150–400)
RBC: 5.07 MIL/uL (ref 4.22–5.81)
RDW: 14.9 % (ref 11.5–15.5)
WBC: 8.3 10*3/uL (ref 4.0–10.5)

## 2015-08-16 LAB — BASIC METABOLIC PANEL
BUN: 20 mg/dL (ref 7–25)
CHLORIDE: 104 mmol/L (ref 98–110)
CO2: 27 mmol/L (ref 20–31)
CREATININE: 1.56 mg/dL — AB (ref 0.70–1.25)
Calcium: 9.9 mg/dL (ref 8.6–10.3)
Glucose, Bld: 123 mg/dL — ABNORMAL HIGH (ref 65–99)
POTASSIUM: 4.1 mmol/L (ref 3.5–5.3)
Sodium: 142 mmol/L (ref 135–146)

## 2015-08-16 MED ORDER — AMLODIPINE BESYLATE 10 MG PO TABS
10.0000 mg | ORAL_TABLET | Freq: Every day | ORAL | Status: DC
  Administered 2015-08-18 – 2015-08-20 (×3): 10 mg via ORAL
  Filled 2015-08-16 (×3): qty 1

## 2015-08-16 MED ORDER — TRAVOPROST (BAK FREE) 0.004 % OP SOLN
1.0000 [drp] | Freq: Every day | OPHTHALMIC | Status: DC
Start: 2015-08-16 — End: 2015-08-25
  Administered 2015-08-20 – 2015-08-24 (×5): 1 [drp] via OPHTHALMIC
  Filled 2015-08-16 (×2): qty 2.5

## 2015-08-16 MED ORDER — ATORVASTATIN CALCIUM 40 MG PO TABS
40.0000 mg | ORAL_TABLET | Freq: Every day | ORAL | Status: DC
  Administered 2015-08-18 – 2015-08-24 (×6): 40 mg via ORAL
  Filled 2015-08-16 (×6): qty 1

## 2015-08-16 MED ORDER — METOPROLOL SUCCINATE ER 25 MG PO TB24
25.0000 mg | ORAL_TABLET | Freq: Every day | ORAL | Status: DC
  Administered 2015-08-18 – 2015-08-20 (×3): 25 mg via ORAL
  Filled 2015-08-16 (×3): qty 1

## 2015-08-16 NOTE — Assessment & Plan Note (Signed)
Acute renal failure secondary to kidney stones [address]15

## 2015-08-16 NOTE — Assessment & Plan Note (Signed)
Pt has noted exertional chest pain and dyspnea since Nov

## 2015-08-16 NOTE — Progress Notes (Signed)
See H&P from 08/06/15- pt sen in the office pre cath.  Rana Hochstein PA-C 08/16/2015 1:32 PM

## 2015-08-16 NOTE — H&P (Signed)
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 He has risk factors for CAD including HTN and HLD (LDL 222). The pt says he noted exertional chest discomfort in Nov. He then noted exertional dyspnea associated with this. He denies any radiation to his neck or arms. Symptoms relieved with rest. Myoview done 08/15/15 was remarkable for anterior ischemia "high risk". He also had chest pin at peak exertion. He is in the office today to discuss coronary angiogram. He denies any rest symptoms.    Problem List: Past Medical History  Diagnosis Date  . Hypertension   . Hypercholesteremia   . Chronic kidney disease     multiple kidney stones  . H/O hiatal hernia   . Benign essential HTN 08/08/2015  . Hyperlipidemia 08/08/2015    Past Surgical History  Procedure Laterality Date  . Back surgery  2006    microdisectomy  . Lithotripsy      2-3 times in past  . Cystoscopy with retrograde pyelogram, ureteroscopy and stent placement Right 01/06/2014    Procedure: CYSTOSCOPY WITH RETROGRADE PYELOGRAM, URETEROSCOPY AND STENT PLACEMENT;  Surgeon: Bernestine Amass, MD;  Location: [facility];  Service: Urology;  Laterality: Right;     Allergies:  Allergies  Allergen Reactions  . Vicodin [Hydrocodone-Acetaminophen] Nausea And Vomiting  . Hydrocodone Nausea And Vomiting  . Oxycontin [Oxycodone Hcl] Nausea And Vomiting  . Percocet [Oxycodone-Acetaminophen] Nausea And Vomiting     Home Medications Prior to Admission medications   Medication Sig Start Date End Date Taking? Authorizing Provider  amLODipine (NORVASC) 10 MG tablet Take 10 mg by mouth daily.    Historical Provider, MD  atorvastatin (LIPITOR) 40 MG tablet Take 1 tablet (40 mg total) by mouth daily. 08/08/15    Sueanne Margarita, MD  metoprolol succinate (TOPROL XL) 25 MG 24 hr tablet Take 1 tablet (25 mg total) by mouth daily. 08/08/15   Sueanne Margarita, MD  nitroGLYCERIN (NITROSTAT) 0.4 MG SL tablet Place 1 tablet (0.4 mg total) under the tongue every 5 (five) minutes as needed for chest pain. 08/08/15   Sueanne Margarita, MD  timolol (TIMOPTIC) 0.5 % ophthalmic solution  07/19/15   Historical Provider, MD  travoprost, benzalkonium, (TRAVATAN) 0.004 % ophthalmic solution Place 1 drop into both eyes at bedtime.    Historical Provider, MD     Family History  Problem Relation Age of Onset  . Kidney failure Mother   . Throat cancer Father      Social History   Social History  . Marital Status: Married    Spouse Name: N/A  . Number of Children: N/A  . Years of Education: N/A   Occupational History  . Not on file.   Social History Main Topics  . Smoking status: Never Smoker   . Smokeless tobacco: Never Used  . Alcohol Use: Yes     Comment: occasional every 2-3 months  . Drug Use: No  . Sexual Activity: Not on file   Other Topics Concern  . Not on file   Social History Narrative     Review of Systems: General: negative for chills, fever, night sweats or weight changes.  Cardiovascular: negative for chest pain, dyspnea on exertion, edema, orthopnea, palpitations, paroxysmal nocturnal dyspnea or  shortness of breath HEENT: negative for any visual disturbances, blindness, glaucoma Dermatological: negative for rash Respiratory: negative for cough, hemoptysis, or wheezing Urologic: negative for hematuria or dysuria Abdominal: negative for nausea, vomiting, diarrhea, bright red blood per rectum, melena, or hematemesis Neurologic: negative for visual changes, syncope, or dizziness Musculoskeletal: negative for back pain, joint pain, or swelling Psych: cooperative and appropriate All other systems reviewed and are otherwise negative except as noted above.  Physical Exam: There were no vitals  taken for this visit.  General appearance: alert, cooperative and no distress Neck: no carotid bruit and no JVD Lungs: clear to auscultation bilaterally Heart: regular rate and rhythm Abdomen: soft, non-tender; bowel sounds normal; no masses,  no organomegaly and umbilical hernia Extremities: extremities normal, atraumatic, no cyanosis or edema Pulses: 2+ and symmetric Skin: Skin color, texture, turgor normal. No rashes or lesions Neurologic: Grossly normal    Labs:  No results found for this or any previous visit (from the past 24 hour(s)).   Radiology/Studies: No results found.  EKG: NSR, NSST changes  ASSESSMENT AND PLAN:  Principal Problem:   Exertional angina (HCC) Active Problems:   Abnormal result of cardiovascular function study   Benign essential HTN   Hyperlipidemia   Chronic renal insufficiency, stage III (moderate)   History of acute renal failure   PLAN: Pt had labs two weeks ago at his PCP. He was told his SCr was 1.5. Dr Radford Pax stopped the pt's Valasartan and added Coreg. She would like the pt admitted for hydration prior to cath.  The patient understands that risks included but are not limited to stroke (1 in 1000), death (1 in 49), kidney failure [usually temporary] (1 in 500), bleeding (1 in 200), allergic reaction [possibly serious] (1 in 200).  The patient understands and agrees to proceed.    Henri Medal, PA-C 08/16/2015, 1:14 PM [phone]

## 2015-08-16 NOTE — Assessment & Plan Note (Signed)
Marked dyslipidemia with LDL > 200

## 2015-08-16 NOTE — Assessment & Plan Note (Signed)
Abnormal Myoview 08/15/15-High risk- distal anterior wall ischemia

## 2015-08-16 NOTE — Telephone Encounter (Signed)
Patient will be admitted tomorrow for one night's stay for hydration prior to heart catheterization 1/20. Instructed patient to call [facility] tomorrow, 1/19 if he has not heard from the hospital so a [facility] representative can call Admissions for follow-up.  Patient agrees with treatment plan.

## 2015-08-16 NOTE — Telephone Encounter (Signed)
Informed patient of results and verbal understanding expressed.   Instructed patient to STOP VALSARTAN. OV scheduled TODAY with Kerin Ransom. Patient agrees with treatment plan.

## 2015-08-16 NOTE — Telephone Encounter (Signed)
New message     Calling to let the nurse know that no one from the hosp has called him regarding his procedure that is supposed to be this Friday at [place].  Patient said he is having a cath

## 2015-08-16 NOTE — Telephone Encounter (Signed)
-----   Message from Sueanne Margarita, MD sent at 08/15/2015 10:36 PM EST ----- Please have patient stop valsartan and start Coreg 3.125mg  BID for BP control.  He has CKD so will need to be admitted the day before cath for hydration and renal consult.  Please work in with extender tomorrow 1/18 to set up admission and go over cath and risks.

## 2015-08-16 NOTE — Patient Instructions (Signed)
Medication Instructions:  Your physician recommends that you continue on your current medications as directed. Please refer to the Current Medication list given to you today.   Labwork: Pre-cath labs today  Testing/Procedures: Your physician has requested that you have a cardiac catheterization. Cardiac catheterization is used to diagnose and/or treat various heart conditions. Doctors may recommend this procedure for a number of different reasons. The most common reason is to evaluate chest pain. Chest pain can be a symptom of coronary artery disease (CAD), and cardiac catheterization can show whether plaque is narrowing or blocking your heart's arteries. This procedure is also used to evaluate the valves, as well as measure the blood flow and oxygen levels in different parts of your heart. For further information please visit [email or web address]. Please follow instruction sheet, as given.  Please report [facility] on the afternoon of 08/17/15 for overnight observation/hydration [facility] will call you directly when they have a bed available, and give you your bed assignment  Follow up: Follow up as planned  Any Other Special Instructions Will Be Listed Below (If Applicable).    Coronary Angiogram A coronary angiogram, also called coronary angiography, is an X-ray procedure used to look at the arteries in the heart. In this procedure, a dye (contrast dye) is injected through a long, hollow tube (catheter). The catheter is about the size of a piece of cooked spaghetti and is inserted through your groin, wrist, or arm. The dye is injected into each artery, and X-rays are then taken to show if there is a blockage in the arteries of your heart. LET [facility] CARE PROVIDER KNOW ABOUT:  Any allergies you have, including allergies to shellfish or contrast dye.   All medicines you are taking, including vitamins, herbs, eye drops, creams, and over-the-counter medicines.   Previous  problems you or members of your family have had with the use of anesthetics.   Any blood disorders you have.   Previous surgeries you have had.  History of kidney problems or failure.   Other medical conditions you have. RISKS AND COMPLICATIONS  Generally, a coronary angiogram is a safe procedure. However, problems can occur and include:  Allergic reaction to the dye.  Bleeding from the access site or other locations.  Kidney injury, especially in people with impaired kidney function.  Stroke (rare).  Heart attack (rare). BEFORE THE PROCEDURE   Do not eat or drink anything after midnight the night before the procedure or as directed by your health care provider.   Ask your health care provider about changing or stopping your regular medicines. This is especially important if you are taking diabetes medicines or blood thinners. PROCEDURE  You may be given a medicine to help you relax (sedative) before the procedure. This medicine is given through an intravenous (IV) access tube that is inserted into one of your veins.   The area where the catheter will be inserted will be washed and shaved. This is usually done in the groin but may be done in the fold of your arm (near your elbow) or in the wrist.   A medicine will be given to numb the area where the catheter will be inserted (local anesthetic).   The health care provider will insert the catheter into an artery. The catheter will be guided by using a special type of X-ray (fluoroscopy) of the blood vessel being examined.   A special dye will then be injected into the catheter, and X-rays will be taken. The dye  will help to show where any narrowing or blockages are located in the heart arteries.  AFTER THE PROCEDURE   If the procedure is done through the leg, you will be kept in bed lying flat for several hours. You will be instructed to not bend or cross your legs.  The insertion site will be checked frequently.    The pulse in your feet or wrist will be checked frequently.   Additional blood tests, X-rays, and an electrocardiogram may be done.    This information is not intended to replace advice given to you by your health care provider. Make sure you discuss any questions you have with your health care provider.   Document Released: 01/19/2003 Document Revised: 08/05/2014 Document Reviewed: 12/07/2012 Elsevier Interactive Patient Education [employer].     If you need a refill on your cardiac medications before your next appointment, please call your pharmacy.

## 2015-08-16 NOTE — Assessment & Plan Note (Signed)
SCr 1.5 two weeks ago at PCP

## 2015-08-16 NOTE — Assessment & Plan Note (Signed)
Controlled.  

## 2015-08-17 ENCOUNTER — Other Ambulatory Visit: Payer: Self-pay

## 2015-08-17 ENCOUNTER — Inpatient Hospital Stay (HOSPITAL_COMMUNITY)
Admission: RE | Admit: 2015-08-17 | Discharge: 2015-08-25 | DRG: 232 | Disposition: A | Discharge status: Home / Self Care | Payer: Medicare Other | Source: Ambulatory Visit | Attending: Cardiothoracic Surgery | Admitting: Cardiothoracic Surgery

## 2015-08-17 ENCOUNTER — Ambulatory Visit (HOSPITAL_BASED_OUTPATIENT_CLINIC_OR_DEPARTMENT_OTHER): Payer: Medicare Other

## 2015-08-17 ENCOUNTER — Encounter (HOSPITAL_COMMUNITY): Payer: Self-pay | Admitting: General Practice

## 2015-08-17 DIAGNOSIS — J9811 Atelectasis: Secondary | ICD-10-CM | POA: Diagnosis not present

## 2015-08-17 DIAGNOSIS — R9430 Abnormal result of cardiovascular function study, unspecified: Secondary | ICD-10-CM | POA: Diagnosis not present

## 2015-08-17 DIAGNOSIS — D62 Acute posthemorrhagic anemia: Secondary | ICD-10-CM | POA: Diagnosis not present

## 2015-08-17 DIAGNOSIS — I25118 Atherosclerotic heart disease of native coronary artery with other forms of angina pectoris: Secondary | ICD-10-CM | POA: Diagnosis not present

## 2015-08-17 DIAGNOSIS — I25110 Atherosclerotic heart disease of native coronary artery with unstable angina pectoris: Secondary | ICD-10-CM | POA: Diagnosis not present

## 2015-08-17 DIAGNOSIS — Z951 Presence of aortocoronary bypass graft: Secondary | ICD-10-CM

## 2015-08-17 DIAGNOSIS — Z955 Presence of coronary angioplasty implant and graft: Secondary | ICD-10-CM | POA: Diagnosis not present

## 2015-08-17 DIAGNOSIS — E785 Hyperlipidemia, unspecified: Secondary | ICD-10-CM | POA: Diagnosis present

## 2015-08-17 DIAGNOSIS — I208 Other forms of angina pectoris: Secondary | ICD-10-CM | POA: Diagnosis not present

## 2015-08-17 DIAGNOSIS — E876 Hypokalemia: Secondary | ICD-10-CM | POA: Diagnosis not present

## 2015-08-17 DIAGNOSIS — R0602 Shortness of breath: Secondary | ICD-10-CM

## 2015-08-17 DIAGNOSIS — I2510 Atherosclerotic heart disease of native coronary artery without angina pectoris: Secondary | ICD-10-CM | POA: Diagnosis not present

## 2015-08-17 DIAGNOSIS — N189 Chronic kidney disease, unspecified: Secondary | ICD-10-CM | POA: Diagnosis not present

## 2015-08-17 DIAGNOSIS — E1122 Type 2 diabetes mellitus with diabetic chronic kidney disease: Secondary | ICD-10-CM | POA: Diagnosis present

## 2015-08-17 DIAGNOSIS — Z01810 Encounter for preprocedural cardiovascular examination: Secondary | ICD-10-CM | POA: Diagnosis not present

## 2015-08-17 DIAGNOSIS — I10 Essential (primary) hypertension: Secondary | ICD-10-CM | POA: Diagnosis present

## 2015-08-17 DIAGNOSIS — I080 Rheumatic disorders of both mitral and aortic valves: Secondary | ICD-10-CM | POA: Diagnosis not present

## 2015-08-17 DIAGNOSIS — N183 Chronic kidney disease, stage 3 unspecified: Secondary | ICD-10-CM | POA: Diagnosis present

## 2015-08-17 DIAGNOSIS — I1310 Hypertensive heart and chronic kidney disease without heart failure, with stage 1 through stage 4 chronic kidney disease, or unspecified chronic kidney disease: Secondary | ICD-10-CM | POA: Diagnosis present

## 2015-08-17 DIAGNOSIS — Z09 Encounter for follow-up examination after completed treatment for conditions other than malignant neoplasm: Secondary | ICD-10-CM

## 2015-08-17 DIAGNOSIS — E8770 Fluid overload, unspecified: Secondary | ICD-10-CM | POA: Diagnosis not present

## 2015-08-17 DIAGNOSIS — Z87448 Personal history of other diseases of urinary system: Secondary | ICD-10-CM | POA: Diagnosis present

## 2015-08-17 DIAGNOSIS — R9431 Abnormal electrocardiogram [ECG] [EKG]: Secondary | ICD-10-CM | POA: Diagnosis not present

## 2015-08-17 DIAGNOSIS — J90 Pleural effusion, not elsewhere classified: Secondary | ICD-10-CM | POA: Diagnosis not present

## 2015-08-17 DIAGNOSIS — R079 Chest pain, unspecified: Secondary | ICD-10-CM | POA: Diagnosis not present

## 2015-08-17 HISTORY — DX: Personal history of other diseases of urinary system: Z87.448

## 2015-08-17 HISTORY — DX: Chronic kidney disease, stage 3 unspecified: N18.30

## 2015-08-17 HISTORY — DX: Chronic kidney disease, stage 3 (moderate): N18.3

## 2015-08-17 LAB — CBC WITH DIFFERENTIAL/PLATELET
Basophils Absolute: 0 10*3/uL (ref 0.0–0.1)
Basophils Relative: 0 %
Eosinophils Absolute: 0.3 10*3/uL (ref 0.0–0.7)
Eosinophils Relative: 3 %
HCT: 44.4 % (ref 39.0–52.0)
Hemoglobin: 15 g/dL (ref 13.0–17.0)
Lymphocytes Relative: 30 %
Lymphs Abs: 2.7 10*3/uL (ref 0.7–4.0)
MCH: 30.9 pg (ref 26.0–34.0)
MCHC: 33.8 g/dL (ref 30.0–36.0)
MCV: 91.5 fL (ref 78.0–100.0)
Monocytes Absolute: 0.9 10*3/uL (ref 0.1–1.0)
Monocytes Relative: 10 %
Neutro Abs: 5.1 10*3/uL (ref 1.7–7.7)
Neutrophils Relative %: 57 %
Platelets: 219 10*3/uL (ref 150–400)
RBC: 4.85 MIL/uL (ref 4.22–5.81)
RDW: 13.3 % (ref 11.5–15.5)
WBC: 8.9 10*3/uL (ref 4.0–10.5)

## 2015-08-17 LAB — PROTIME-INR
INR: 0.95 (ref <1.50)
INR: 1.07 (ref 0.00–1.49)
PROTHROMBIN TIME: 12.8 s (ref 11.6–15.2)
Prothrombin Time: 14.1 seconds (ref 11.6–15.2)

## 2015-08-17 LAB — BASIC METABOLIC PANEL
Anion gap: 10 (ref 5–15)
BUN: 18 mg/dL (ref 6–20)
CO2: 24 mmol/L (ref 22–32)
Calcium: 9 mg/dL (ref 8.9–10.3)
Chloride: 110 mmol/L (ref 101–111)
Creatinine, Ser: 1.65 mg/dL — ABNORMAL HIGH (ref 0.61–1.24)
GFR calc Af Amer: 49 mL/min — ABNORMAL LOW (ref >60)
GFR calc non Af Amer: 42 mL/min — ABNORMAL LOW (ref >60)
Glucose, Bld: 112 mg/dL — ABNORMAL HIGH (ref 65–99)
Potassium: 3.9 mmol/L (ref 3.5–5.1)
Sodium: 144 mmol/L (ref 135–145)

## 2015-08-17 LAB — TSH: TSH: 3.479 u[IU]/mL (ref 0.350–4.500)

## 2015-08-17 LAB — APTT: aPTT: 41 seconds — ABNORMAL HIGH (ref 24–37)

## 2015-08-17 MED ORDER — NITROGLYCERIN 0.4 MG SL SUBL: 0.4000 mg | SUBLINGUAL_TABLET | SUBLINGUAL | Status: DC | PRN

## 2015-08-17 MED ORDER — ONDANSETRON HCL 4 MG/2ML IJ SOLN: 4.0000 mg | Freq: Four times a day (QID) | INTRAMUSCULAR | Status: DC | PRN

## 2015-08-17 MED ORDER — SODIUM CHLORIDE 0.9 % IV SOLN
INTRAVENOUS | Status: DC
  Administered 2015-08-17 – 2015-08-18 (×2): via INTRAVENOUS

## 2015-08-17 MED ORDER — SODIUM CHLORIDE 0.9 % IV SOLN: 250.0000 mL | INTRAVENOUS | Status: DC | PRN

## 2015-08-17 MED ORDER — ALPRAZOLAM 0.25 MG PO TABS
0.2500 mg | ORAL_TABLET | Freq: Two times a day (BID) | ORAL | Status: DC | PRN
  Administered 2015-08-20: 0.25 mg via ORAL
  Filled 2015-08-17 (×2): qty 1

## 2015-08-17 MED ORDER — HEPARIN SODIUM (PORCINE) 5000 UNIT/ML IJ SOLN
5000.0000 [IU] | Freq: Three times a day (TID) | INTRAMUSCULAR | Status: DC
  Filled 2015-08-17 (×3): qty 1

## 2015-08-17 MED ORDER — SODIUM CHLORIDE 0.9 % IJ SOLN
3.0000 mL | Freq: Two times a day (BID) | INTRAMUSCULAR | Status: DC
  Administered 2015-08-18 – 2015-08-20 (×4): 3 mL via INTRAVENOUS

## 2015-08-17 MED ORDER — SODIUM CHLORIDE 0.9 % IJ SOLN
3.0000 mL | INTRAMUSCULAR | Status: DC | PRN
  Administered 2015-08-18: 10 mL via INTRAVENOUS
  Filled 2015-08-17: qty 3

## 2015-08-17 MED ORDER — ZOLPIDEM TARTRATE 5 MG PO TABS: 5.0000 mg | ORAL_TABLET | Freq: Every evening | ORAL | Status: DC | PRN

## 2015-08-17 NOTE — Telephone Encounter (Signed)
Pt has been admitted to [facility].

## 2015-08-17 NOTE — Plan of Care (Signed)
Problem: Consults Goal: Chest Pain Patient Education (See Patient Education module for education specifics.)  Outcome: Completed/Met Date Met:  08/17/15 Patient direct admitted from MD office for abnormal stress myoview/recent history of chest discomfort. Plan for first-case cardiac catheterization in AM per Dr. Varanasi.  Patient and family educated at bedside re:  Plan of care (chest pain, cardiac catheterization)  Safety goals  IV therapy  Pre-cardiac catheterization phase of care  Medications  Pain rating scale  Need to notify RN for any symptoms  Preliminary discharge planning  Patient has read-ahead regarding cardiac catheterization; declines video at this time. No further educational needs at this time.  No current symptoms reported. Physical assessment is benign.  Two large-bore saline lock IVs started in left forearm for procedure in AM. Will obtain procedure consent and continue to monitor.     

## 2015-08-18 ENCOUNTER — Encounter (HOSPITAL_COMMUNITY): Payer: Self-pay | Admitting: Interventional Cardiology

## 2015-08-18 ENCOUNTER — Encounter (HOSPITAL_COMMUNITY)
Admission: RE | Disposition: A | Discharge status: Home / Self Care | Payer: Medicare Other | Source: Ambulatory Visit | Attending: Cardiology

## 2015-08-18 ENCOUNTER — Other Ambulatory Visit: Payer: Self-pay | Admitting: *Deleted

## 2015-08-18 ENCOUNTER — Inpatient Hospital Stay (HOSPITAL_COMMUNITY): Payer: Medicare Other

## 2015-08-18 DIAGNOSIS — N189 Chronic kidney disease, unspecified: Secondary | ICD-10-CM

## 2015-08-18 DIAGNOSIS — I2510 Atherosclerotic heart disease of native coronary artery without angina pectoris: Secondary | ICD-10-CM

## 2015-08-18 DIAGNOSIS — I10 Essential (primary) hypertension: Secondary | ICD-10-CM

## 2015-08-18 DIAGNOSIS — I25110 Atherosclerotic heart disease of native coronary artery with unstable angina pectoris: Secondary | ICD-10-CM

## 2015-08-18 DIAGNOSIS — R9430 Abnormal result of cardiovascular function study, unspecified: Secondary | ICD-10-CM

## 2015-08-18 DIAGNOSIS — E785 Hyperlipidemia, unspecified: Secondary | ICD-10-CM

## 2015-08-18 HISTORY — PX: CARDIAC CATHETERIZATION: SHX172

## 2015-08-18 LAB — URINALYSIS, ROUTINE W REFLEX MICROSCOPIC
Bilirubin Urine: NEGATIVE
Glucose, UA: NEGATIVE mg/dL
Ketones, ur: NEGATIVE mg/dL
Leukocytes, UA: NEGATIVE
Nitrite: NEGATIVE
Protein, ur: 30 mg/dL — AB
Specific Gravity, Urine: 1.018 (ref 1.005–1.030)
pH: 5.5 (ref 5.0–8.0)

## 2015-08-18 LAB — PULMONARY FUNCTION TEST
FEF 25-75 POST: 4.13 L/s
FEF 25-75 PRE: 4.11 L/s
FEF2575-%CHANGE-POST: 0 %
FEF2575-%PRED-POST: 187 %
FEF2575-%PRED-PRE: 185 %
FEV1-%Change-Post: 1 %
FEV1-%PRED-POST: 112 %
FEV1-%Pred-Pre: 111 %
FEV1-Post: 2.73 L
FEV1-Pre: 2.7 L
FEV1FVC-%CHANGE-POST: 5 %
FEV1FVC-%PRED-PRE: 112 %
FEV6-%CHANGE-POST: -2 %
FEV6-%Pred-Post: 99 %
FEV6-%Pred-Pre: 101 %
FEV6-PRE: 3.07 L
FEV6-Post: 3 L
FEV6FVC-%Change-Post: 1 %
FEV6FVC-%Pred-Post: 105 %
FEV6FVC-%Pred-Pre: 103 %
FVC-%Change-Post: -3 %
FVC-%Pred-Post: 94 %
FVC-%Pred-Pre: 97 %
FVC-Post: 3 L
FVC-Pre: 3.11 L
POST FEV1/FVC RATIO: 91 %
PRE FEV1/FVC RATIO: 87 %
Post FEV6/FVC ratio: 100 %
Pre FEV6/FVC Ratio: 99 %

## 2015-08-18 LAB — URINE MICROSCOPIC-ADD ON: WBC, UA: NONE SEEN WBC/hpf (ref 0–5)

## 2015-08-18 LAB — BASIC METABOLIC PANEL
Anion gap: 10 (ref 5–15)
BUN: 16 mg/dL (ref 6–20)
CALCIUM: 9 mg/dL (ref 8.9–10.3)
CO2: 25 mmol/L (ref 22–32)
CREATININE: 1.61 mg/dL — AB (ref 0.61–1.24)
Chloride: 106 mmol/L (ref 101–111)
GFR calc Af Amer: 50 mL/min — ABNORMAL LOW (ref >60)
GFR, EST NON AFRICAN AMERICAN: 43 mL/min — AB (ref >60)
Glucose, Bld: 135 mg/dL — ABNORMAL HIGH (ref 65–99)
POTASSIUM: 3.8 mmol/L (ref 3.5–5.1)
SODIUM: 141 mmol/L (ref 135–145)

## 2015-08-18 SURGERY — LEFT HEART CATH AND CORONARY ANGIOGRAPHY
Anesthesia: LOCAL

## 2015-08-18 MED ORDER — HEPARIN SODIUM (PORCINE) 1000 UNIT/ML IJ SOLN
INTRAMUSCULAR | Status: DC | PRN
  Administered 2015-08-18: 4500 [IU] via INTRAVENOUS

## 2015-08-18 MED ORDER — LIDOCAINE HCL (PF) 1 % IJ SOLN
INTRAMUSCULAR | Status: DC | PRN
  Administered 2015-08-18: 20 mL via INTRADERMAL

## 2015-08-18 MED ORDER — ACETAMINOPHEN 325 MG PO TABS
650.0000 mg | ORAL_TABLET | ORAL | Status: DC | PRN
Start: 2015-08-18 — End: 2015-08-21

## 2015-08-18 MED ORDER — ASPIRIN 81 MG PO CHEW
81.0000 mg | CHEWABLE_TABLET | Freq: Every day | ORAL | Status: DC
  Administered 2015-08-19 – 2015-08-20 (×2): 81 mg via ORAL
  Filled 2015-08-18 (×2): qty 1

## 2015-08-18 MED ORDER — VERAPAMIL HCL 2.5 MG/ML IV SOLN
INTRAVENOUS | Status: AC
  Filled 2015-08-18: qty 2

## 2015-08-18 MED ORDER — FENTANYL CITRATE (PF) 100 MCG/2ML IJ SOLN
INTRAMUSCULAR | Status: AC
  Filled 2015-08-18: qty 2

## 2015-08-18 MED ORDER — ASPIRIN 81 MG PO CHEW
324.0000 mg | CHEWABLE_TABLET | Freq: Once | ORAL | Status: AC
  Administered 2015-08-18: 324 mg via ORAL

## 2015-08-18 MED ORDER — HEPARIN SODIUM (PORCINE) 1000 UNIT/ML IJ SOLN
INTRAMUSCULAR | Status: AC
  Filled 2015-08-18: qty 1

## 2015-08-18 MED ORDER — ONDANSETRON HCL 4 MG/2ML IJ SOLN: 4.0000 mg | Freq: Four times a day (QID) | INTRAMUSCULAR | Status: DC | PRN

## 2015-08-18 MED ORDER — HEPARIN (PORCINE) IN NACL 2-0.9 UNIT/ML-% IJ SOLN
INTRAMUSCULAR | Status: AC
  Filled 2015-08-18: qty 1000

## 2015-08-18 MED ORDER — VERAPAMIL HCL 2.5 MG/ML IV SOLN
INTRAVENOUS | Status: DC | PRN
  Administered 2015-08-18: 10 mL via INTRA_ARTERIAL

## 2015-08-18 MED ORDER — MIDAZOLAM HCL 2 MG/2ML IJ SOLN
INTRAMUSCULAR | Status: AC
  Filled 2015-08-18: qty 2

## 2015-08-18 MED ORDER — SODIUM CHLORIDE 0.9 % IJ SOLN: 3.0000 mL | INTRAMUSCULAR | Status: DC | PRN

## 2015-08-18 MED ORDER — IOHEXOL 350 MG/ML SOLN
INTRAVENOUS | Status: DC | PRN
  Administered 2015-08-18: 100 mL via INTRA_ARTERIAL

## 2015-08-18 MED ORDER — FENTANYL CITRATE (PF) 100 MCG/2ML IJ SOLN
INTRAMUSCULAR | Status: DC | PRN
  Administered 2015-08-18 (×3): 25 ug via INTRAVENOUS

## 2015-08-18 MED ORDER — SODIUM CHLORIDE 0.9 % IV SOLN: 250.0000 mL | INTRAVENOUS | Status: DC | PRN

## 2015-08-18 MED ORDER — LIDOCAINE HCL (PF) 1 % IJ SOLN
INTRAMUSCULAR | Status: AC
  Filled 2015-08-18: qty 30

## 2015-08-18 MED ORDER — MIDAZOLAM HCL 2 MG/2ML IJ SOLN
INTRAMUSCULAR | Status: DC | PRN
  Administered 2015-08-18: 2 mg via INTRAVENOUS
  Administered 2015-08-18 (×2): 1 mg via INTRAVENOUS

## 2015-08-18 MED ORDER — SODIUM CHLORIDE 0.9 % WEIGHT BASED INFUSION
3.0000 mL/kg/h | INTRAVENOUS | Status: AC
Start: 2015-08-18 — End: 2015-08-18
  Administered 2015-08-18: 3 mL/kg/h via INTRAVENOUS

## 2015-08-18 MED ORDER — ALBUTEROL SULFATE (2.5 MG/3ML) 0.083% IN NEBU
2.5000 mg | INHALATION_SOLUTION | Freq: Once | RESPIRATORY_TRACT | Status: AC
  Administered 2015-08-18: 2.5 mg via RESPIRATORY_TRACT

## 2015-08-18 MED ORDER — SODIUM CHLORIDE 0.9 % IJ SOLN
3.0000 mL | Freq: Two times a day (BID) | INTRAMUSCULAR | Status: DC
Start: 2015-08-18 — End: 2015-08-21
  Administered 2015-08-18 – 2015-08-20 (×4): 3 mL via INTRAVENOUS

## 2015-08-18 SURGICAL SUPPLY — 15 items
CATH IMPULSE 5F ANG/FL3.5 (CATHETERS) ×1 IMPLANT
CATH INFINITI 5 FR 3DRC (CATHETERS) ×1 IMPLANT
CATH INFINITI JR4 5F (CATHETERS) ×1 IMPLANT
CATH LAUNCHER 5F EBU3.0 (CATHETERS) IMPLANT
CATHETER LAUNCHER 5F EBU3.0 (CATHETERS) ×2
DEVICE RAD COMP TR BAND LRG (VASCULAR PRODUCTS) ×2 IMPLANT
GLIDESHEATH SLEND SS 6F .021 (SHEATH) ×2 IMPLANT
KIT HEART LEFT (KITS) ×2 IMPLANT
PACK CARDIAC CATHETERIZATION (CUSTOM PROCEDURE TRAY) ×2 IMPLANT
SHEATH PINNACLE 5F 10CM (SHEATH) ×1 IMPLANT
SYR MEDRAD MARK V 150ML (SYRINGE) ×2 IMPLANT
TRANSDUCER W/STOPCOCK (MISCELLANEOUS) ×2 IMPLANT
TUBING CIL FLEX 10 FLL-RA (TUBING) ×2 IMPLANT
WIRE EMERALD 3MM-J .035X150CM (WIRE) ×1 IMPLANT
WIRE SAFE-T 1.5MM-J .035X260CM (WIRE) ×2 IMPLANT

## 2015-08-18 NOTE — Progress Notes (Signed)
Site area: Right groin a 5 french arterial sheath was removed  Site Prior to Removal:  Level 0  Pressure Applied For 20 MINUTES    Minutes Beginning at 0905am  Manual:   Yes.    Patient Status During Pull:  stable  Post Pull Groin Site:  Level 0  Post Pull Instructions Given:  Yes.    Post Pull Pulses Present:  Yes.    Dressing Applied:  Yes.    Comments:  VS remain stable during sheath pull

## 2015-08-18 NOTE — Consult Note (Signed)
[address]       [place] [address]             [phone]        Jeffrey Holmes [facility] Medical Record [id number] Date of Birth: 09/01/1950  Referring: Sueanne Margarita, MD Primary Care: Nanci Pina, FNP  Chief Complaint:   Chest pain   History of Present Illness:     Jeffrey Holmes is a 65 y.o. male who presents for evaluation of chest pain and SOB. Prior to November of 2015 he was able to exercise without difficulity. In  November he noticed that he started having DOE as well as a dull pain on the left side of his chest. He says that he notices the chest discomfort only with physical exertion. It is over the left upper chest with no radiation. He denies any diaphoresis or nausea with the pain. It usually lasts until he stops exerting himself. He denies any rest pain. Over the past month the frequency and level of intesity has increased.  Class II-III angina   He denies any LE edema, dizziness, palpitations, claudication or syncope. He has a history of HTN, dyslipidemia and CKD.  He has a history of kidney stones  in the summer 2016 he was in  to be in ARF secondary to dehydration and ACE I and Lotrel was stopped cr of 6.8 returned to baseline 1.5.   He recently had a very intensive lab panel done which showed a total chol of 337, TAGs 461, LDL 227, HDL 42 and elevated Lpa.     Current Activity/ Functional Status: Patient is independent with mobility/ambulation, transfers, ADL's, IADL's.   Zubrod Score: At the time of surgery this patient's most appropriate activity status/level should be described as: []     0    Normal activity, no symptoms [x]     1    Restricted in physical strenuous activity but ambulatory, able to do out light work limited by chest pain  []     2    Ambulatory and capable of self care, unable to do work activities, up and about                 more than 50%  Of the time                            []     3    Only limited  self care, in bed greater than 50% of waking hours []     4    Completely disabled, no self care, confined to bed or chair []     5    Moribund  Past Medical History  Diagnosis Date  . Hypertension   . Hypercholesteremia   . Chronic kidney disease, stage 3     multiple kidney stones  . H/O hiatal hernia   . Benign essential HTN 08/08/2015  . Hyperlipidemia 08/08/2015  . History of acute renal failure 2015    secondary to stones    Past Surgical History  Procedure Laterality Date  . Back surgery  2006    microdisectomy  . Lithotripsy      2-3 times in past  . Cystoscopy with retrograde pyelogram, ureteroscopy and stent placement Right 01/06/2014    Procedure: CYSTOSCOPY WITH RETROGRADE PYELOGRAM, URETEROSCOPY AND STENT PLACEMENT;  Surgeon: Bernestine Amass, MD;  Location: [facility];  Service: Urology;  Laterality:  Right;  . Cardiovascular stress test  08/15/2015  . Cardiac catheterization N/A 08/18/2015    Procedure: Left Heart Cath and Coronary Angiography;  Surgeon: Jettie Booze, MD;  Location: [facility];  Service: Cardiovascular;  Laterality: N/A;    History  Smoking status  . Never Smoker   Smokeless tobacco  . Never Used    History  Alcohol Use  . Yes    Comment: occasional every 2-3 months    Social History   Social History  . Marital Status: Married    Spouse Name: N/A  . Number of Children: N/A  . Years of Education: N/A   Occupational History  . Not on file.   Social History Main Topics  . Smoking status: Never Smoker   . Smokeless tobacco: Never Used  . Alcohol Use: Yes     Comment: occasional every 2-3 months  . Drug Use: No  . Sexual Activity: Not on file   Other Topics Concern  . Not on file   Social History Narrative    Allergies  Allergen Reactions  . Vicodin [Hydrocodone-Acetaminophen] Nausea And Vomiting  . Hydrocodone Nausea And Vomiting  . Oxycontin [Oxycodone Hcl] Nausea And Vomiting  . Percocet [Oxycodone-Acetaminophen]  Nausea And Vomiting    Current Facility-Administered Medications  Medication Dose Route Frequency Provider Last Rate Last Dose  . 0.9 %  sodium chloride infusion  250 mL Intravenous PRN Doreene Burke Kilroy, PA-C      . 0.9 %  sodium chloride infusion  250 mL Intravenous PRN Jettie Booze, MD      . acetaminophen (TYLENOL) tablet 650 mg  650 mg Oral Q4H PRN Jettie Booze, MD      . ALPRAZolam Duanne Moron) tablet 0.25 mg  0.25 mg Oral BID PRN Doreene Burke Kilroy, PA-C      . amLODipine (NORVASC) tablet 10 mg  10 mg Oral Daily Erlene Quan, PA-C   10 mg at 08/18/15 1010  . [START ON 08/19/2015] aspirin chewable tablet 81 mg  81 mg Oral Daily Jettie Booze, MD      . atorvastatin (LIPITOR) tablet 40 mg  40 mg Oral q1800 Doreene Burke Kilroy, PA-C      . heparin injection 5,000 Units  5,000 Units Subcutaneous 3 times per day Erlene Quan, PA-C      . metoprolol succinate (TOPROL-XL) 24 hr tablet 25 mg  25 mg Oral Daily Luke K Kilroy, PA-C   25 mg at 08/18/15 1010  . nitroGLYCERIN (NITROSTAT) SL tablet 0.4 mg  0.4 mg Sublingual Q5 Min x 3 PRN Doreene Burke Kilroy, PA-C      . ondansetron [facility]) injection 4 mg  4 mg Intravenous Q6H PRN Jettie Booze, MD      . sodium chloride 0.9 % injection 3 mL  3 mL Intravenous Q12H Luke K Kilroy, PA-C   3 mL at 08/17/15 1700  . sodium chloride 0.9 % injection 3 mL  3 mL Intravenous PRN Erlene Quan, PA-C   10 mL at 08/18/15 0428  . sodium chloride 0.9 % injection 3 mL  3 mL Intravenous Q12H Jettie Booze, MD   3 mL at 08/18/15 1216  . sodium chloride 0.9 % injection 3 mL  3 mL Intravenous PRN Jettie Booze, MD      . Travoprost (BAK Free) (TRAVATAN) 0.004 % ophthalmic solution SOLN 1 drop  1 drop Both Eyes [facility]HS [employer], PA-C      .  zolpidem (AMBIEN) tablet 5 mg  5 mg Oral QHS PRN Erlene Quan, PA-C        Prescriptions prior to admission  Medication Sig Dispense Refill Last Dose  . amLODipine (NORVASC) 10 MG tablet Take 10 mg by mouth daily.    Taking  . atorvastatin (LIPITOR) 40 MG tablet Take 1 tablet (40 mg total) by mouth daily. 30 tablet 11 Taking  . metoprolol succinate (TOPROL XL) 25 MG 24 hr tablet Take 1 tablet (25 mg total) by mouth daily. 30 tablet 11 Taking  . nitroGLYCERIN (NITROSTAT) 0.4 MG SL tablet Place 1 tablet (0.4 mg total) under the tongue every 5 (five) minutes as needed for chest pain. 25 tablet 1 Taking  . timolol (TIMOPTIC) 0.5 % ophthalmic solution Place 1 drop into both eyes 2 (two) times daily.    Taking  . travoprost, benzalkonium, (TRAVATAN) 0.004 % ophthalmic solution Place 1 drop into both eyes at bedtime.   Taking    Family History  Problem Relation Age of Onset  . Kidney failure Mother   . Throat cancer Father      Review of Systems:      Cardiac Review of Systems: Y or N  Chest Pain [   y ]  Resting SOB [  n ] Exertional SOB  Blue.Reese  ]  Orthopnea Florencio.Farrier  ]   Pedal Edema [  n ]    Palpitations [  n] Syncope  [ n ]   Presyncope [ n  ]  General Review of Systems: [Y] = yes [  ]=no Constitional: recent weight change [ n ]; anorexia [  ]; fatigue [ y ]; nausea [  ]; night sweats [  ]; fever [  ]; or chills [  ]                                                               Dental: poor dentition[  ]; Last Dentist visit:   Eye : blurred vision [  ]; diplopia [   ]; vision changes [  ];  Amaurosis fugax[  ]; Resp: cough [  ];  wheezing[  ];  hemoptysis[  ]; shortness of breath[  y]; paroxysmal nocturnal dyspnea[  ]; dyspnea on exertion[y  ]; or orthopnea[  ];  GI:  gallstones[  ], vomiting[  ];  dysphagia[  ]; melena[  ];  hematochezia [  ]; heartburn[  ];   Hx of  Colonoscopy[  ]; GU: kidney stones [  ]; hematuria[  ];   dysuria [  ];  nocturia[  ];  history of     obstruction [  ]; urinary frequency [  ]             Skin: rash, swelling[  ];, hair loss[  ];  peripheral edema[  ];  or itching[  ]; Musculosketetal: myalgias[  ];  joint swelling[  ];  joint erythema[  ];  joint pain[  ];  back pain[   ];  Heme/Lymph: bruising[  ];  bleeding[  ];  anemia[n ];  Neuro: TIA[ n ];  headaches[  ];  stroke[n  ];  vertigo[  ];  seizures[  ];   paresthesias[  ];  difficulty walking[n  ];  Psych:depression[  ];  anxiety[  ];  Endocrine: diabetes[  ];  thyroid dysfunction[  ];  Immunizations: Flu [  ]; Pneumococcal[  ];  Other:  Physical Exam: BP 141/83 mmHg  Pulse 72  Temp(Src) 98 F (36.7 C) (Oral)  Resp 22  Ht 5\' 5"  (1.651 m)  Wt 186 lb 4.8 oz (84.505 kg)  BMI 31.00 kg/m2  SpO2 94%   General appearance: alert, cooperative, appears stated age and no distress Head: Normocephalic, without obvious abnormality, atraumatic Neck: no adenopathy, no carotid bruit, no JVD, supple, symmetrical, trachea midline and thyroid not enlarged, symmetric, no tenderness/mass/nodules Lymph nodes: Cervical, supraclavicular, and axillary nodes normal. Resp: clear to auscultation bilaterally Back: symmetric, no curvature. ROM normal. No CVA tenderness. Cardio: regular rate and rhythm, S1, S2 normal, no murmur, click, rub or gallop GI: soft, non-tender; bowel sounds normal; no masses,  no organomegaly Extremities: extremities normal, atraumatic, no cyanosis or edema and Homans sign is negative, no sign of DVT Neurologic: Grossly normal  Diagnostic Studies & Laboratory data:     Recent Radiology Findings:   No results found.   I have independently reviewed the above radiologic studies.  Recent Lab Findings: Lab Results  Component Value Date   WBC 8.9 08/17/2015   HGB 15.0 08/17/2015   HCT 44.4 08/17/2015   PLT 219 08/17/2015   GLUCOSE 135* 08/18/2015   NA 141 08/18/2015   K 3.8 08/18/2015   CL 106 08/18/2015   CREATININE 1.61* 08/18/2015   BUN 16 08/18/2015   CO2 25 08/18/2015   TSH 3.479 08/17/2015   INR 1.07 08/17/2015   Procedures    Left Heart Cath and Coronary Angiography    Conclusion     Ost LAD to Mid LAD lesion, 80% stenosed.  Mid LAD lesion, 99% stenosed.  Prox RCA lesion,  70% stenosed.  Normal LVEDP  Severe LAD disease. Percutaneous revascularization would be difficult since the ostium of the vessel is involved and there have to be stent in the left main. The mid vessel lesion is subtotally occluded and there are no collaterals which would make wire manipulation higher risk. I think a LIMA to LAD would give the patient the best long-term result.  In regards to his RCA lesion, this appears amenable to percutaneous revascularization. I've spoken to Dr. Roxan Hockey regarding a possible hybrid approach in which DES to the RCA would be performed while LIMA to LAD, potentially off-pump, could be performed. We'll discuss this further.       I have independently reviewed the above  cath films and reviewed the findings with the  patient .  ECHO:  LV EF: 55% -  60%  ------------------------------------------------------------------- Study Conclusions  - Left ventricle: There was mild hypertrophy of the posterior wall and moderate hypertrophy of the septum. Systolic function was normal. The estimated ejection fraction was in the range of 55% to 60%. Doppler parameters are consistent with abnormal left ventricular relaxation (grade 1 diastolic dysfunction). - Mitral valve: There was trivial regurgitation. - Right ventricle: The cavity size was normal. Wall thickness was normal. Systolic function was normal. - Atrial septum: No defect or patent foramen ovale was identified. - Pulmonic valve: There was trivial regurgitation. - Inferior vena cava: The vessel was normal in size. Consistent with normal central venous pressure.  Chronic Kidney Disease   Stage I     GFR >90  Stage II    GFR 60-89  Stage IIIA GFR 45-59  Stage IIIB GFR 30-44  Stage IV   GFR 15-29  Stage V    GFR  <15  Lab Results  Component Value Date   CREATININE 1.61* 08/18/2015   Estimated Creatinine Clearance: 45.7 mL/min (by C-G formula based on Cr of 1.61).     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 Irish Lack- I agree with recommendation and have discussed with the patient and his wife proceeding with hybrid procedure including stenting of the proximal right coronary artery and coronary artery bypass grafting possibly off-pump using the mammary artery and possible pain to the diagonal.   Chronic renal disease   Stage IIIA GFR 45-59  The goals risks and alternatives of the planned surgical procedure CABG  have been discussed with the patient in detail. The risks of the procedure including death, infection, stroke, myocardial infarction, bleeding, blood transfusion have all been discussed specifically.  I have quoted Jeffrey Holmes a 2 % of perioperative mortality and a complication rate as high as 25 %. The patient's questions have been answered.Jeffrey Holmes is willing  to proceed with the planned procedure.  In addition to other potential risks and complications from the surgery, I have made the patient aware of the recent [place] concerning the risk of infection by Myocobacterium chimaera related to the use of Stockert 3T heater-cooler equipment during cardiac surgery. I discussed with the patient the low risk of infection, as well as our compliance with the most current FDA recommendations to minimize infection and testing of all devices for contamination. The patient has been made aware of the limited alternatives to immediately replacing the current equipment. The patient has been informed regarding the risks associated with waiting to proceed with needed surgery and that such risks are greater than the risk of infection related to the use of the heater-cooler device. I did make the patient aware that after careful review of the patients having cardiac surgery at [facility] we have no evidence  that heater/cooler related infections have occurred at [facility]. We discussed that this is a slow-growing bacterium, such that it can take some period of time for symptoms to develop.   I  spent 40 minutes counseling the patient face to face and 50% or more the  time was spent in counseling and coordination of care. The total time spent in the appointment was 60 minutes.    [name] MD      [address] [place] [address] Office [phone]   Beeper [phone]  08/18/2015 3:59 PM

## 2015-08-18 NOTE — Care Management Note (Addendum)
Case Management Note  Patient Details  Name: BENJAMIM MATHISON MRN: [id number] Date of Birth: 11-Mar-1951  Subjective/Objective: Pt admitted for positive stress test. S/p cath-revealed severe ostial and mid LAD disease.Plan is for CVTS consult. Pt is from home with wife. Pt ambulating in room well.             Action/Plan: CM will continue to monitor for disposition needs.    Expected Discharge Date:                  Expected Discharge Plan:  Home/Self Care  In-House Referral:  NA  Discharge planning Services  CM Consult  Post Acute Care Choice:  NA Choice offered to:  NA  DME Arranged:   DME Agency:    HH Arranged:  HH Agency:   Status of Service: In process will continue to follow Medicare Important Message Given:    Date Medicare IM Given:    Medicare IM give by:    Date Additional Medicare IM Given:    Additional Medicare Important Message give by:     If discussed at [place] of Stay Meetings, dates discussed:    Additional Comments:  Bethena Roys, RN 08/18/2015, 2:02 PM

## 2015-08-18 NOTE — Progress Notes (Signed)
Pt TR band has been removed. Both the Right radial and Right Groin sites are Level 0, pt states he is at 0 out of 10 pain. Pt understanding of all precautions. RN also discussed with pt at length about Open Heart. Pt and wife states they are overwhelmed by the entire situation, and would like time to talk to other family members and think about the open heart surgery. Preparation has begun, RN will hold off on further preparation

## 2015-08-18 NOTE — Progress Notes (Signed)
UR Completed Yarima Penman Graves-Bigelow, RN,BSN [phone]  

## 2015-08-18 NOTE — Research (Signed)
[place] Study Informed Consent   Subject Name: Jeffrey Holmes  Subject met inclusion and exclusion criteria.  The informed consent form, study requirements and expectations were reviewed with the subject and questions and concerns were addressed prior to the signing of the consent form.  The subject verbalized understanding of the trial requirements.  The subject agreed to participate in the [place] trial and signed the informed consent.  The informed consent was obtained prior to performance of any protocol-specific procedures for the subject.  A copy of the signed informed consent was given to the subject and a copy was placed in the subject's medical record.  Marlana Salvage 08/18/2015, 07:13 AM

## 2015-08-18 NOTE — Progress Notes (Signed)
   SUBJECTIVE:  No complaints post cath  OBJECTIVE:   Vitals:   Filed Vitals:   08/18/15 1030 08/18/15 1100 08/18/15 1130 08/18/15 1200  BP: 135/78 152/83 139/85 141/83  Pulse: 64 83 72 72  Temp:   98 F (36.7 C)   TempSrc:   Oral   Resp: 19 20 26 22   Height:      Weight:      SpO2: 93% 97% 95% 94%   I&O's:   Intake/Output Summary (Last 24 hours) at 08/18/15 1402 Last data filed at 08/18/15 1100  Gross per 24 hour  Intake   1530 ml  Output    700 ml  Net    830 ml   TELEMETRY: Reviewed telemetry pt in NSR:     PHYSICAL EXAM General: Well developed, well nourished, in no acute distress Head: Eyes PERRLA, No xanthomas.   Normal cephalic and atramatic  Lungs:   Clear bilaterally to auscultation and percussion. Heart:   HRRR S1 S2 Pulses are 2+ & equal. Abdomen: Bowel sounds are positive, abdomen soft and non-tender without masses  Extremities:   No clubbing, cyanosis or edema.  DP +1 Neuro: Alert and oriented X 3. Psych:  Good affect, responds appropriately   LABS: Basic Metabolic Panel:  Recent Labs  08/17/15 1644 08/18/15 0517  NA 144 141  K 3.9 3.8  CL 110 106  CO2 24 25  GLUCOSE 112* 135*  BUN 18 16  CREATININE 1.65* 1.61*  CALCIUM 9.0 9.0   Liver Function Tests: No results for input(s): AST, ALT, ALKPHOS, BILITOT, PROT, ALBUMIN in the last 72 hours. No results for input(s): LIPASE, AMYLASE in the last 72 hours. CBC:  Recent Labs  08/16/15 1048 08/17/15 1644  WBC 8.3 8.9  NEUTROABS 4.6 5.1  HGB 15.7 15.0  HCT 46.0 44.4  MCV 90.7 91.5  PLT 235 219   Cardiac Enzymes: No results for input(s): CKTOTAL, CKMB, CKMBINDEX, TROPONINI in the last 72 hours. BNP: Invalid input(s): POCBNP D-Dimer: No results for input(s): DDIMER in the last 72 hours. Hemoglobin A1C: No results for input(s): HGBA1C in the last 72 hours. Fasting Lipid Panel: No results for input(s): CHOL, HDL, LDLCALC, TRIG, CHOLHDL, LDLDIRECT in the last 72 hours. Thyroid Function  Tests:  Recent Labs  08/17/15 1644  TSH 3.479   Anemia Panel: No results for input(s): VITAMINB12, FOLATE, FERRITIN, TIBC, IRON, RETICCTPCT in the last 72 hours. Coag Panel:   Lab Results  Component Value Date   INR 1.07 08/17/2015   INR 0.95 08/16/2015    RADIOLOGY: No results found.  ASSESSMENT AND PLAN:  Principal Problem:  Exertional angina (HCC) Active Problems:  Abnormal result of cardiovascular function study  Benign essential HTN  Hyperlipidemia  Chronic renal insufficiency, stage III (moderate)  History of acute renal failure  1.  ASCAD s/p cath this am showing severe 80% ostial and 99% mid LAD disease as well as irregular 90% prox RCA lesion and patent LCx.  Considering hybrid procedure with PCI of the RCA and LIMA to LAD.  Await CVTS consult.  Continue ASA/statin/BB. 2.  Exertional angina secondary to #1 3.  HTN - controlled on current medical regimen 4.  Dyslipidemia - continue statin and check FLP 5.  CKD stage III - creatinine stable but will need to follow closely post cath.   Sueanne Margarita, MD  08/18/2015  2:02 PM

## 2015-08-18 NOTE — H&P (Signed)
See H&P dated 08/15/2014.  Patient admitted today for hydration prior to cath tomorrow.

## 2015-08-18 NOTE — Progress Notes (Signed)
CARDIAC REHAB PHASE I   PRE:  Rate/Rhythm: 98 SR  BP:  Supine:   Sitting:   Standing: 155/80   SaO2: 97%RA  MODE:  Ambulation: 550 ft   POST:  Rate/Rhythm: 94  BP:  Supine:   Sitting: 149/89  Standing:    SaO2: 97%RA 1335-1355 Pt walked 550 ft independently without CP or SOB. Tolerated well. Left [facility] booklet and care guide but did not ed as pt stated he is not sure what the plan will be but probably surgery. Pt stated his wife and daughter would be able to provide care at discharge if he has surgery. Will follow up tomorrow.   Graylon Good, RN BSN  08/18/2015 1:52 PM

## 2015-08-18 NOTE — Progress Notes (Signed)
    Prelim cath report.  Patient with severe ostial and mid LAD disease.  Patient with irregular RCA lesion and patent circumflex.    Plan for CVTS consult.  WOuld consider hybrid procedure with PCI of RCA and LIMA to LAD.  Full report to follow.  Jettie Booze, MD

## 2015-08-18 NOTE — Interval H&P Note (Signed)
Cath Lab Visit (complete for each Cath Lab visit)  Clinical Evaluation Leading to the Procedure:   ACS: No.  Non-ACS:    Anginal Classification: CCS III  Anti-ischemic medical therapy: Minimal Therapy (1 class of medications)  Non-Invasive Test Results: High-risk stress test findings: cardiac mortality >3%/year  Prior CABG: No previous CABG      History and Physical Interval Note:  08/18/2015 7:46 AM  Jeffrey Holmes  has presented today for surgery, with the diagnosis of abnomal nuclear stress test  The various methods of treatment have been discussed with the patient and family. After consideration of risks, benefits and other options for treatment, the patient has consented to  Procedure(s): Left Heart Cath and Coronary Angiography (N/A) as a surgical intervention .  The patient's history has been reviewed, patient examined, no change in status, stable for surgery.  I have reviewed the patient's chart and labs.  Questions were answered to the patient's satisfaction.     Charisse Wendell S.

## 2015-08-19 ENCOUNTER — Inpatient Hospital Stay (HOSPITAL_COMMUNITY): Payer: Medicare Other

## 2015-08-19 DIAGNOSIS — I2510 Atherosclerotic heart disease of native coronary artery without angina pectoris: Secondary | ICD-10-CM

## 2015-08-19 LAB — CBC
HCT: 41.7 % (ref 39.0–52.0)
Hemoglobin: 14.4 g/dL (ref 13.0–17.0)
MCH: 31.2 pg (ref 26.0–34.0)
MCHC: 34.5 g/dL (ref 30.0–36.0)
MCV: 90.5 fL (ref 78.0–100.0)
Platelets: 209 10*3/uL (ref 150–400)
RBC: 4.61 MIL/uL (ref 4.22–5.81)
RDW: 13.3 % (ref 11.5–15.5)
WBC: 9.6 10*3/uL (ref 4.0–10.5)

## 2015-08-19 LAB — LIPID PANEL
Cholesterol: 212 mg/dL — ABNORMAL HIGH (ref 0–200)
HDL: 35 mg/dL — ABNORMAL LOW (ref >40)
LDL Cholesterol: 132 mg/dL — ABNORMAL HIGH (ref 0–99)
Total CHOL/HDL Ratio: 6.1 RATIO
Triglycerides: 224 mg/dL — ABNORMAL HIGH (ref <150)
VLDL: 45 mg/dL — ABNORMAL HIGH (ref 0–40)

## 2015-08-19 LAB — SURGICAL PCR SCREEN
MRSA, PCR: NEGATIVE
Staphylococcus aureus: NEGATIVE

## 2015-08-19 LAB — COMPREHENSIVE METABOLIC PANEL
ALT: 46 U/L (ref 17–63)
AST: 22 U/L (ref 15–41)
Albumin: 3.5 g/dL (ref 3.5–5.0)
Alkaline Phosphatase: 70 U/L (ref 38–126)
Anion gap: 8 (ref 5–15)
BUN: 14 mg/dL (ref 6–20)
CO2: 26 mmol/L (ref 22–32)
Calcium: 9 mg/dL (ref 8.9–10.3)
Chloride: 109 mmol/L (ref 101–111)
Creatinine, Ser: 1.57 mg/dL — ABNORMAL HIGH (ref 0.61–1.24)
GFR calc Af Amer: 52 mL/min — ABNORMAL LOW (ref >60)
GFR calc non Af Amer: 45 mL/min — ABNORMAL LOW (ref >60)
Glucose, Bld: 121 mg/dL — ABNORMAL HIGH (ref 65–99)
Potassium: 3.7 mmol/L (ref 3.5–5.1)
Sodium: 143 mmol/L (ref 135–145)
Total Bilirubin: 0.9 mg/dL (ref 0.3–1.2)
Total Protein: 7.1 g/dL (ref 6.5–8.1)

## 2015-08-19 LAB — POCT ACTIVATED CLOTTING TIME: Activated Clotting Time: 183 seconds

## 2015-08-19 NOTE — Progress Notes (Signed)
Subjective:  Has been apprehensive about the speed at which are all this has progressed.  Had mild chest tightness when he was taking his pulmonary function tests this morning not currently having any chest pain.  History of severe hypercholesterolemia and may be a candidate for PCSK9 inhibitors in the future.  Objective:  Vital Signs in the last 24 hours: BP 136/59 mmHg  Pulse 74  Temp(Src) 98.6 F (37 C) (Oral)  Resp 18  Ht 5\' 5"  (1.651 m)  Wt 83.643 kg (184 lb 6.4 oz)  BMI 30.69 kg/m2  SpO2 94%  Physical Exam: Pleasant black male in no acute distress Lungs:  Clear Cardiac:  Regular rhythm, normal S1 and S2, no S3 Extremities:  Radial and femoral Sites are clean and dry without hematoma   Intake/Output from previous day: 01/20 0701 - 01/21 0700 In: 960 [P.O.:960] Out: 1000 [Urine:1000]  Weight Filed Weights   08/17/15 1541 08/18/15 0550 08/19/15 0615  Weight: 84.505 kg (186 lb 4.8 oz) 84.505 kg (186 lb 4.8 oz) 83.643 kg (184 lb 6.4 oz)    Lab Results: Basic Metabolic Panel:  Recent Labs  08/18/15 0517 08/19/15 0435  NA 141 143  K 3.8 3.7  CL 106 109  CO2 25 26  GLUCOSE 135* 121*  BUN 16 14  CREATININE 1.61* 1.57*   CBC:  Recent Labs  08/16/15 1048 08/17/15 1644 08/19/15 0435  WBC 8.3 8.9 9.6  NEUTROABS 4.6 5.1  --   HGB 15.7 15.0 14.4  HCT 46.0 44.4 41.7  MCV 90.7 91.5 90.5  PLT 235 219 209    Telemetry: Sinus rhythm  Assessment/Plan:  1.  Three-vessel coronary artery disease with plans for off-pump LIMA as well as hybrid procedure with stenting of the other vessels 2.  Hypertensive heart disease 3.  Chronic kidney disease  Recommendations:  The patient was somewhat apprehensive about the speed at which this is all taken place.  I talked to him a long time about his situation and he appears to be more accepting of it although he is still anxious about it.  The plan is for this to take place on Monday.  He had a mild amount of chest discomfort  when he had his pulmonary function tests but is not currently having chest pain.     Kerry Hough  MD [facility]  08/19/2015, 10:40 AM

## 2015-08-19 NOTE — Progress Notes (Signed)
[id number] Discussed with pt importance of mobility and IS after surgery. Discussed sternal precautions and demonstrated how to get up and down without use of arms. Wrote down how to view pre op video. Pt has OHS booklet and care guide. Will follow up after surgery. Pt has been walking independently.Graylon Good RN BSN 08/19/2015 12:09 PM

## 2015-08-19 NOTE — Plan of Care (Signed)
Problem: Phase III Progression Outcomes Goal: No anginal pain Outcome: Completed/Met Date Met:  08/18/15 Patient continues to endorse no further chest pain or SOB. Right radial and right femoral sites level 0 with no evidence of complications.  Vital signs have been WDL for patient post-procedure today: Filed Vitals:    08/18/15 1130 08/18/15 1200 08/18/15 1700 08/18/15 1945  BP: 139/85 141/83 146/86 145/83  Pulse: 72 72 80 66  Temp: 98 F (36.7 C)   98.8 F (37.1 C) 98.8 F (37.1 C)  TempSrc: Oral   Oral Oral  Resp: 26 22 21 20  Height:          Weight:          SpO2: 95% 94% 97% 97%    Patient and family have voiced several concerns and high level of anxiety regarding current plan for open-heart surgery with intraoperative PCI. Patient and spouse, in particular, feel that the diagnosis and plan have evolved very quickly and they are having difficulty "digesting everything." Mr. Stiggers stated "I feel like I need to step off the train and smell the roses."  Provided close emotional and educational support to patient and family at bedside and answered numerous questions regarding prep, procedure and recovery. Patient and family educated re:  Plan of care:  Post cardiac-catheterization site care  Prep for cardiac surgery  Specific surgical targets (LIMA to mid-LAD grafting, possible intraoperative PCI to proximal LAD and RCA)  Post-operative ICU phase of care/initial recovery  Progression to discharge  Cardiac rehabilitation  Long-term goals  Current diagnosis and diagnostic results/diagrams  Labs/tests  Medications  Safety plan  Infection prevention  Anxiety/stress reduction  Family support plan  Patient is in the process of making a decision regarding surgery with the close support of his family and nursing. He understands the risks and benefits of coronary artery bypass and angioplasty/stent. He further understands that his long-term prognosis (potentially,  short-term) is poor without these interventions.  Will continue to monitor, educate and provide support as needed.        

## 2015-08-19 NOTE — Progress Notes (Signed)
Pre-op Cardiac Surgery  Carotid Findings: 1-39% ICA plaquing.  Vertebral artery flow is antegrade.    Upper Extremity Right Left  Brachial Pressures 156T 149T  Radial Waveforms T T  Ulnar Waveforms T T  Palmar Arch (Allen's Test) Doppler signal obliterates with radial compression and remains normal with ulnar compression Doppler signal remains normal with radial compression and diminishes >50% with ulnar compression   Findings:      Lower  Extremity Right Left  Dorsalis Pedis    Anterior Tibial T T  Posterior Tibial T T  Ankle/Brachial Indices      Findings:  Palpable and Triphasic

## 2015-08-20 DIAGNOSIS — I25110 Atherosclerotic heart disease of native coronary artery with unstable angina pectoris: Secondary | ICD-10-CM

## 2015-08-20 LAB — BLOOD GAS, ARTERIAL
ACID-BASE DEFICIT: 2.1 mmol/L — AB (ref 0.0–2.0)
Bicarbonate: 21.4 mEq/L (ref 20.0–24.0)
DRAWN BY: 313941
FIO2: 0.21
O2 SAT: 94.5 %
PATIENT TEMPERATURE: 98.6
TCO2: 22.4 mmol/L (ref 0–100)
pCO2 arterial: 32.2 mmHg — ABNORMAL LOW (ref 35.0–45.0)
pH, Arterial: 7.439 (ref 7.350–7.450)
pO2, Arterial: 72.3 mmHg — ABNORMAL LOW (ref 80.0–100.0)

## 2015-08-20 LAB — BASIC METABOLIC PANEL
Anion gap: 10 (ref 5–15)
BUN: 16 mg/dL (ref 6–20)
CO2: 23 mmol/L (ref 22–32)
Calcium: 9.2 mg/dL (ref 8.9–10.3)
Chloride: 108 mmol/L (ref 101–111)
Creatinine, Ser: 1.54 mg/dL — ABNORMAL HIGH (ref 0.61–1.24)
GFR calc Af Amer: 53 mL/min — ABNORMAL LOW (ref >60)
GFR calc non Af Amer: 46 mL/min — ABNORMAL LOW (ref >60)
Glucose, Bld: 131 mg/dL — ABNORMAL HIGH (ref 65–99)
Potassium: 3.9 mmol/L (ref 3.5–5.1)
Sodium: 141 mmol/L (ref 135–145)

## 2015-08-20 LAB — CBC
HCT: 42.9 % (ref 39.0–52.0)
Hemoglobin: 15 g/dL (ref 13.0–17.0)
MCH: 31.6 pg (ref 26.0–34.0)
MCHC: 35 g/dL (ref 30.0–36.0)
MCV: 90.5 fL (ref 78.0–100.0)
Platelets: 204 10*3/uL (ref 150–400)
RBC: 4.74 MIL/uL (ref 4.22–5.81)
RDW: 13.3 % (ref 11.5–15.5)
WBC: 9.1 10*3/uL (ref 4.0–10.5)

## 2015-08-20 LAB — ABO/RH: ABO/RH(D): O POS

## 2015-08-20 LAB — TYPE AND SCREEN
ABO/RH(D): O POS
Antibody Screen: NEGATIVE

## 2015-08-20 MED ORDER — EPINEPHRINE HCL 1 MG/ML IJ SOLN
0.0000 ug/min | INTRAMUSCULAR | Status: DC
  Filled 2015-08-20: qty 4

## 2015-08-20 MED ORDER — DEXTROSE 5 % IV SOLN
30.0000 ug/min | INTRAVENOUS | Status: DC
  Filled 2015-08-20: qty 2

## 2015-08-20 MED ORDER — POTASSIUM CHLORIDE 2 MEQ/ML IV SOLN
80.0000 meq | INTRAVENOUS | Status: DC
Start: 2015-08-21 — End: 2015-08-21
  Filled 2015-08-20: qty 40

## 2015-08-20 MED ORDER — BISACODYL 5 MG PO TBEC
5.0000 mg | DELAYED_RELEASE_TABLET | Freq: Once | ORAL | Status: AC
Start: 2015-08-20 — End: 2015-08-20
  Administered 2015-08-20: 5 mg via ORAL
  Filled 2015-08-20: qty 1

## 2015-08-20 MED ORDER — SODIUM CHLORIDE 0.9 % IV SOLN
INTRAVENOUS | Status: AC
  Administered 2015-08-21: 69.8 mL/h via INTRAVENOUS
  Filled 2015-08-20: qty 40

## 2015-08-20 MED ORDER — CHLORHEXIDINE GLUCONATE 4 % EX LIQD
60.0000 mL | Freq: Once | CUTANEOUS | Status: AC
  Administered 2015-08-21: 4 via TOPICAL
  Filled 2015-08-20: qty 60

## 2015-08-20 MED ORDER — SODIUM CHLORIDE 0.9 % IV SOLN
INTRAVENOUS | Status: DC
  Filled 2015-08-20: qty 30

## 2015-08-20 MED ORDER — TEMAZEPAM 15 MG PO CAPS: 15.0000 mg | ORAL_CAPSULE | Freq: Once | ORAL | Status: DC | PRN

## 2015-08-20 MED ORDER — PHENYLEPHRINE HCL 10 MG/ML IJ SOLN
30.0000 ug/min | INTRAVENOUS | Status: AC
  Administered 2015-08-21: 25 ug/min via INTRAVENOUS
  Filled 2015-08-20: qty 2

## 2015-08-20 MED ORDER — PLASMA-LYTE 148 IV SOLN
INTRAVENOUS | Status: DC
  Filled 2015-08-20: qty 2.5

## 2015-08-20 MED ORDER — NITROGLYCERIN IN D5W 200-5 MCG/ML-% IV SOLN
2.0000 ug/min | INTRAVENOUS | Status: DC
Start: 2015-08-21 — End: 2015-08-21
  Filled 2015-08-20: qty 250

## 2015-08-20 MED ORDER — VANCOMYCIN HCL 10 G IV SOLR: 1250.0000 mg | INTRAVENOUS | Status: DC

## 2015-08-20 MED ORDER — SODIUM BICARBONATE 8.4 % IV SOLN
INTRAVENOUS | Status: DC
  Filled 2015-08-20: qty 2.5

## 2015-08-20 MED ORDER — DEXTROSE 5 % IV SOLN
750.0000 mg | INTRAVENOUS | Status: DC
  Filled 2015-08-20: qty 750

## 2015-08-20 MED ORDER — ASPIRIN 81 MG PO CHEW
81.0000 mg | CHEWABLE_TABLET | ORAL | Status: AC
  Administered 2015-08-21: 81 mg via ORAL
  Filled 2015-08-20: qty 1

## 2015-08-20 MED ORDER — SODIUM CHLORIDE 0.9 % IV SOLN
INTRAVENOUS | Status: AC
  Administered 2015-08-21: 1 [IU]/h via INTRAVENOUS
  Filled 2015-08-20: qty 2.5

## 2015-08-20 MED ORDER — CHLORHEXIDINE GLUCONATE 0.12 % MT SOLN
15.0000 mL | Freq: Once | OROMUCOSAL | Status: AC
  Administered 2015-08-21: 15 mL via OROMUCOSAL
  Filled 2015-08-20: qty 15

## 2015-08-20 MED ORDER — POTASSIUM CHLORIDE 2 MEQ/ML IV SOLN
80.0000 meq | INTRAVENOUS | Status: DC
  Filled 2015-08-20: qty 40

## 2015-08-20 MED ORDER — DOPAMINE-DEXTROSE 3.2-5 MG/ML-% IV SOLN: 0.0000 ug/kg/min | INTRAVENOUS | Status: DC

## 2015-08-20 MED ORDER — NITROGLYCERIN IN D5W 200-5 MCG/ML-% IV SOLN
2.0000 ug/min | INTRAVENOUS | Status: AC
  Administered 2015-08-21: 5 ug/min via INTRAVENOUS

## 2015-08-20 MED ORDER — SODIUM CHLORIDE 0.9 % IV SOLN
INTRAVENOUS | Status: DC
  Filled 2015-08-20: qty 40

## 2015-08-20 MED ORDER — DEXMEDETOMIDINE HCL IN NACL 400 MCG/100ML IV SOLN
0.1000 ug/kg/h | INTRAVENOUS | Status: DC
  Filled 2015-08-20: qty 100

## 2015-08-20 MED ORDER — SODIUM CHLORIDE 0.9 % IJ SOLN: 3.0000 mL | INTRAMUSCULAR | Status: DC | PRN

## 2015-08-20 MED ORDER — MAGNESIUM SULFATE 50 % IJ SOLN
40.0000 meq | INTRAMUSCULAR | Status: DC
  Filled 2015-08-20: qty 10

## 2015-08-20 MED ORDER — CHLORHEXIDINE GLUCONATE 4 % EX LIQD
60.0000 mL | Freq: Once | CUTANEOUS | Status: AC
  Administered 2015-08-20: 4 via TOPICAL
  Filled 2015-08-20: qty 60

## 2015-08-20 MED ORDER — DOPAMINE-DEXTROSE 3.2-5 MG/ML-% IV SOLN
0.0000 ug/kg/min | INTRAVENOUS | Status: DC
  Filled 2015-08-20: qty 250

## 2015-08-20 MED ORDER — MAGNESIUM SULFATE 50 % IJ SOLN
40.0000 meq | INTRAMUSCULAR | Status: DC
Start: 2015-08-21 — End: 2015-08-21
  Filled 2015-08-20: qty 10

## 2015-08-20 MED ORDER — SODIUM CHLORIDE 0.9 % IV SOLN
INTRAVENOUS | Status: DC
  Administered 2015-08-21: 75 mL/h via INTRAVENOUS

## 2015-08-20 MED ORDER — SODIUM CHLORIDE 0.9 % IV SOLN
INTRAVENOUS | Status: DC
  Filled 2015-08-20: qty 2.5

## 2015-08-20 MED ORDER — DEXMEDETOMIDINE HCL IN NACL 400 MCG/100ML IV SOLN
0.1000 ug/kg/h | INTRAVENOUS | Status: AC
Start: 2015-08-21 — End: 2015-08-21
  Administered 2015-08-21: .3 ug/kg/h via INTRAVENOUS
  Filled 2015-08-20: qty 100

## 2015-08-20 MED ORDER — METOPROLOL TARTRATE 12.5 MG HALF TABLET
12.5000 mg | ORAL_TABLET | Freq: Once | ORAL | Status: AC
  Administered 2015-08-21: 12.5 mg via ORAL
  Filled 2015-08-20: qty 1

## 2015-08-20 MED ORDER — SODIUM CHLORIDE 0.9 % IJ SOLN: 3.0000 mL | Freq: Two times a day (BID) | INTRAMUSCULAR | Status: DC

## 2015-08-20 MED ORDER — PLASMA-LYTE 148 IV SOLN
INTRAVENOUS | Status: AC
  Administered 2015-08-21: 500 mL
  Filled 2015-08-20: qty 2.5

## 2015-08-20 MED ORDER — VANCOMYCIN HCL 10 G IV SOLR
1250.0000 mg | INTRAVENOUS | Status: AC
  Administered 2015-08-21: 1250 mg via INTRAVENOUS
  Filled 2015-08-20: qty 1250

## 2015-08-20 MED ORDER — SODIUM CHLORIDE 0.9 % IV SOLN: 250.0000 mL | INTRAVENOUS | Status: DC | PRN

## 2015-08-20 MED ORDER — CEFUROXIME SODIUM 1.5 G IJ SOLR
1.5000 g | INTRAMUSCULAR | Status: AC
  Administered 2015-08-21: 750 g via INTRAVENOUS
  Administered 2015-08-21: 1.5 g via INTRAVENOUS
  Filled 2015-08-20 (×2): qty 1.5

## 2015-08-20 MED ORDER — DEXTROSE 5 % IV SOLN: 1.5000 g | INTRAVENOUS | Status: DC

## 2015-08-20 NOTE — Progress Notes (Signed)
    Plan hybrid procedure Monday with Dr. Servando Snare.  Patient pain free at this time.  If pain start, restart IV heparin.  Critical LAD disease with no collaterals.  High risk for ACS.  Plan to hydrate and monitor kidney function before CABG Monday.  Jettie Booze, MD

## 2015-08-20 NOTE — Plan of Care (Signed)
Problem: Safety: Goal: Ability to remain free from injury will improve Outcome: Completed/Met Date Met:  08/20/15 Pt is up ad lib, and walks the halls 3x per day, Pt tolerating well

## 2015-08-20 NOTE — Progress Notes (Signed)
2 Days Post-Op Procedure(s) (LRB): Left Heart Cath and Coronary Angiography (N/A) Subjective: No complaints A little anxious about procedure but feels better about it now  Objective: Vital signs in last 24 hours: Temp:  [98.2 F (36.8 C)-98.5 F (36.9 C)] 98.5 F (36.9 C) (01/22 0619) Pulse Rate:  [60-73] 60 (01/22 0619) Cardiac Rhythm:  [-] Normal sinus rhythm (01/22 0816) Resp:  [15-18] 18 (01/22 0619) BP: (125-133)/(68-79) 130/76 mmHg (01/22 0619) SpO2:  [95 %-98 %] 98 % (01/22 0619) Weight:  [183 lb 9.6 oz (83.28 kg)] 183 lb 9.6 oz (83.28 kg) (01/22 [id number])  Hemodynamic parameters for last 24 hours:    Intake/Output from previous day: 01/21 0701 - 01/22 0700 In: 243 [P.O.:240; I.V.:3] Out: -  Intake/Output this shift: Total I/O In: 240 [P.O.:240] Out: -   General appearance: alert, cooperative and no distress Heart: regular rate and rhythm  Lab Results:  Recent Labs  08/19/15 0435 08/20/15 0523  WBC 9.6 9.1  HGB 14.4 15.0  HCT 41.7 42.9  PLT 209 204   BMET:  Recent Labs  08/19/15 0435 08/20/15 0523  NA 143 141  K 3.7 3.9  CL 109 108  CO2 26 23  GLUCOSE 121* 131*  BUN 14 16  CREATININE 1.57* 1.54*  CALCIUM 9.0 9.2    PT/INR:  Recent Labs  08/17/15 1644  LABPROT 14.1  INR 1.07   ABG No results found for: PHART, HCO3, TCO2, ACIDBASEDEF, O2SAT CBG (last 3)  No results for input(s): GLUCAP in the last 72 hours.  Assessment/Plan: S/P Procedure(s) (LRB): Left Heart Cath and Coronary Angiography (N/A) -  2 vessel CAD- for hybrid CABG/ PCI tomorrow Creatinine is stable Carotids and PFT OK  Nichols Corter C. Roxan Hockey, MD [facility] [phone]    LOS: 3 days    Melrose Nakayama 08/20/2015

## 2015-08-20 NOTE — Progress Notes (Signed)
Subjective:  He is much more settled today.  Questions invited about the procedure and he is agreeable to proceed.  No chest discomfort.  Objective:  Vital Signs in the last 24 hours: BP 130/76 mmHg  Pulse 60  Temp(Src) 98.5 F (36.9 C) (Oral)  Resp 18  Ht 5\' 5"  (1.651 m)  Wt 83.28 kg (183 lb 9.6 oz)  BMI 30.55 kg/m2  SpO2 98%  Physical Exam: Pleasant black male in no acute distress Lungs:  Clear Cardiac:  Regular rhythm, normal S1 and S2, no S3 Extremities:  Radial and femoral Sites are clean and dry without hematoma   Intake/Output from previous day: 01/21 0701 - 01/22 0700 In: 243 [P.O.:240; I.V.:3] Out: -   Weight Filed Weights   08/18/15 0550 08/19/15 0615 08/20/15 0619  Weight: 84.505 kg (186 lb 4.8 oz) 83.643 kg (184 lb 6.4 oz) 83.28 kg (183 lb 9.6 oz)    Lab Results: Basic Metabolic Panel:  Recent Labs  08/19/15 0435 08/20/15 0523  NA 143 141  K 3.7 3.9  CL 109 108  CO2 26 23  GLUCOSE 121* 131*  BUN 14 16  CREATININE 1.57* 1.54*   CBC:  Recent Labs  08/17/15 1644 08/19/15 0435 08/20/15 0523  WBC 8.9 9.6 9.1  NEUTROABS 5.1  --   --   HGB 15.0 14.4 15.0  HCT 44.4 41.7 42.9  MCV 91.5 90.5 90.5  PLT 219 209 204    Telemetry: Sinus rhythm  Assessment/Plan:  1.  Three-vessel coronary artery disease with plans for off-pump LIMA as well as hybrid procedure with stenting of the other vessels 2.  Hypertensive heart disease 3.  Chronic kidney disease  Recommendations:  Questions again invited.  He appears to be comfortable about proceeding with the hybrid procedure  planned for tomorrow.      Kerry Hough  MD [facility]  08/20/2015, 8:36 AM

## 2015-08-20 NOTE — Progress Notes (Signed)
Pt CABG prep almost complete, PT states he wants to have EKG done after company leaves and he is "Settled". Pt in a very good mood, RN has educated pt on preop procedure. Pt is agreeable.

## 2015-08-20 NOTE — Plan of Care (Signed)
Problem: Discharge Progression Outcomes Goal: Vascular site scale level 0 - I Vascular Site Scale Level 0: No bruising/bleeding/hematoma Level I (Mild): Bruising/Ecchymosis, minimal bleeding/ooozing, palpable hematoma < 3 cm Level II (Moderate): Bleeding not affecting hemodynamic parameters, pseudoaneurysm, palpable hematoma > 3 cm Level III (Severe) Bleeding which affects hemodynamic parameters or retroperitoneal hemorrhage  Pt radial site level 0, groin site level 1 with minimal bruising

## 2015-08-21 ENCOUNTER — Inpatient Hospital Stay (HOSPITAL_COMMUNITY)
Admission: RE | Admit: 2015-08-21 | Discharge: 2015-08-21 | Disposition: A | Discharge status: Home / Self Care | Payer: Medicare Other | Source: Ambulatory Visit | Attending: Cardiothoracic Surgery | Admitting: Cardiothoracic Surgery

## 2015-08-21 ENCOUNTER — Encounter (HOSPITAL_COMMUNITY)
Admission: RE | Disposition: A | Discharge status: Home / Self Care | Payer: Self-pay | Source: Ambulatory Visit | Attending: Cardiology

## 2015-08-21 ENCOUNTER — Ambulatory Visit (HOSPITAL_COMMUNITY): Admit: 2015-08-21 | Payer: Self-pay | Admitting: Interventional Cardiology

## 2015-08-21 ENCOUNTER — Encounter (HOSPITAL_COMMUNITY): Payer: Self-pay | Admitting: Certified Registered"

## 2015-08-21 ENCOUNTER — Encounter (HOSPITAL_COMMUNITY): Payer: Medicare Other

## 2015-08-21 ENCOUNTER — Inpatient Hospital Stay (HOSPITAL_COMMUNITY): Payer: Medicare Other

## 2015-08-21 ENCOUNTER — Inpatient Hospital Stay (HOSPITAL_COMMUNITY): Payer: Medicare Other | Admitting: Certified Registered"

## 2015-08-21 ENCOUNTER — Encounter (HOSPITAL_COMMUNITY)
Admission: RE | Disposition: A | Discharge status: Home / Self Care | Payer: Medicare Other | Source: Ambulatory Visit | Attending: Cardiology

## 2015-08-21 ENCOUNTER — Other Ambulatory Visit: Payer: Self-pay

## 2015-08-21 DIAGNOSIS — I208 Other forms of angina pectoris: Secondary | ICD-10-CM

## 2015-08-21 HISTORY — PX: CORONARY ARTERY BYPASS GRAFT: SHX141

## 2015-08-21 HISTORY — PX: TEE WITHOUT CARDIOVERSION: SHX5443

## 2015-08-21 HISTORY — PX: CARDIAC CATHETERIZATION: SHX172

## 2015-08-21 HISTORY — PX: ANGIOPLASTY: SHX39

## 2015-08-21 LAB — POCT I-STAT 3, ART BLOOD GAS (G3+)
ACID-BASE DEFICIT: 4 mmol/L — AB (ref 0.0–2.0)
Acid-base deficit: 5 mmol/L — ABNORMAL HIGH (ref 0.0–2.0)
Bicarbonate: 21.5 mEq/L (ref 20.0–24.0)
Bicarbonate: 20 mEq/L (ref 20.0–24.0)
O2 Saturation: 95 %
O2 Saturation: 96 %
PH ART: 7.349 — AB (ref 7.350–7.450)
TCO2: 23 mmol/L (ref 0–100)
TCO2: 21 mmol/L (ref 0–100)
pCO2 arterial: 41.4 mmHg (ref 35.0–45.0)
pCO2 arterial: 36.3 mmHg (ref 35.0–45.0)
pH, Arterial: 7.323 — ABNORMAL LOW (ref 7.350–7.450)
pO2, Arterial: 83 mmHg (ref 80.0–100.0)
pO2, Arterial: 82 mmHg (ref 80.0–100.0)

## 2015-08-21 LAB — POCT I-STAT, CHEM 8
BUN: 18 mg/dL (ref 6–20)
BUN: 16 mg/dL (ref 6–20)
BUN: 16 mg/dL (ref 6–20)
BUN: 16 mg/dL (ref 6–20)
BUN: 15 mg/dL (ref 6–20)
CALCIUM ION: 1.22 mmol/L (ref 1.13–1.30)
CALCIUM ION: 1.23 mmol/L (ref 1.13–1.30)
CALCIUM ION: 1.23 mmol/L (ref 1.13–1.30)
CALCIUM ION: 1.23 mmol/L (ref 1.13–1.30)
CHLORIDE: 109 mmol/L (ref 101–111)
CREATININE: 1.2 mg/dL (ref 0.61–1.24)
Calcium, Ion: 1.2 mmol/L (ref 1.13–1.30)
Chloride: 109 mmol/L (ref 101–111)
Chloride: 109 mmol/L (ref 101–111)
Chloride: 109 mmol/L (ref 101–111)
Chloride: 110 mmol/L (ref 101–111)
Creatinine, Ser: 1.3 mg/dL — ABNORMAL HIGH (ref 0.61–1.24)
Creatinine, Ser: 1.2 mg/dL (ref 0.61–1.24)
Creatinine, Ser: 1.1 mg/dL (ref 0.61–1.24)
Creatinine, Ser: 1.2 mg/dL (ref 0.61–1.24)
GLUCOSE: 118 mg/dL — AB (ref 65–99)
GLUCOSE: 111 mg/dL — AB (ref 65–99)
GLUCOSE: 128 mg/dL — AB (ref 65–99)
Glucose, Bld: 128 mg/dL — ABNORMAL HIGH (ref 65–99)
Glucose, Bld: 117 mg/dL — ABNORMAL HIGH (ref 65–99)
HCT: 37 % — ABNORMAL LOW (ref 39.0–52.0)
HCT: 38 % — ABNORMAL LOW (ref 39.0–52.0)
HCT: 37 % — ABNORMAL LOW (ref 39.0–52.0)
HEMATOCRIT: 41 % (ref 39.0–52.0)
HEMATOCRIT: 37 % — AB (ref 39.0–52.0)
HEMOGLOBIN: 12.6 g/dL — AB (ref 13.0–17.0)
HEMOGLOBIN: 12.9 g/dL — AB (ref 13.0–17.0)
HEMOGLOBIN: 12.6 g/dL — AB (ref 13.0–17.0)
Hemoglobin: 13.9 g/dL (ref 13.0–17.0)
Hemoglobin: 12.6 g/dL — ABNORMAL LOW (ref 13.0–17.0)
POTASSIUM: 4.1 mmol/L (ref 3.5–5.1)
Potassium: 3.9 mmol/L (ref 3.5–5.1)
Potassium: 4.1 mmol/L (ref 3.5–5.1)
Potassium: 4 mmol/L (ref 3.5–5.1)
Potassium: 3.7 mmol/L (ref 3.5–5.1)
SODIUM: 142 mmol/L (ref 135–145)
SODIUM: 142 mmol/L (ref 135–145)
Sodium: 141 mmol/L (ref 135–145)
Sodium: 143 mmol/L (ref 135–145)
Sodium: 144 mmol/L (ref 135–145)
TCO2: 23 mmol/L (ref 0–100)
TCO2: 25 mmol/L (ref 0–100)
TCO2: 25 mmol/L (ref 0–100)
TCO2: 24 mmol/L (ref 0–100)
TCO2: 24 mmol/L (ref 0–100)

## 2015-08-21 LAB — HEMOGLOBIN A1C
Hgb A1c MFr Bld: 6.9 % — ABNORMAL HIGH (ref 4.8–5.6)
Mean Plasma Glucose: 151 mg/dL

## 2015-08-21 LAB — GLUCOSE, CAPILLARY
GLUCOSE-CAPILLARY: 92 mg/dL (ref 65–99)
GLUCOSE-CAPILLARY: 125 mg/dL — AB (ref 65–99)
Glucose-Capillary: 92 mg/dL (ref 65–99)

## 2015-08-21 LAB — POCT I-STAT 4, (NA,K, GLUC, HGB,HCT)
Glucose, Bld: 102 mg/dL — ABNORMAL HIGH (ref 65–99)
HCT: 37 % — ABNORMAL LOW (ref 39.0–52.0)
Hemoglobin: 12.6 g/dL — ABNORMAL LOW (ref 13.0–17.0)
POTASSIUM: 3.5 mmol/L (ref 3.5–5.1)
SODIUM: 144 mmol/L (ref 135–145)

## 2015-08-21 LAB — CBC
HCT: 38.3 % — ABNORMAL LOW (ref 39.0–52.0)
Hemoglobin: 13.1 g/dL (ref 13.0–17.0)
MCH: 30.7 pg (ref 26.0–34.0)
MCHC: 34.2 g/dL (ref 30.0–36.0)
MCV: 89.7 fL (ref 78.0–100.0)
PLATELETS: 191 10*3/uL (ref 150–400)
RBC: 4.27 MIL/uL (ref 4.22–5.81)
RDW: 13.1 % (ref 11.5–15.5)
WBC: 11.1 10*3/uL — AB (ref 4.0–10.5)

## 2015-08-21 LAB — APTT: APTT: 32 s (ref 24–37)

## 2015-08-21 LAB — PROTIME-INR
INR: 1.27 (ref 0.00–1.49)
PROTHROMBIN TIME: 16.1 s — AB (ref 11.6–15.2)

## 2015-08-21 SURGERY — CORONARY STENT INTERVENTION
Anesthesia: LOCAL

## 2015-08-21 SURGERY — ECHOCARDIOGRAM, TRANSESOPHAGEAL
Anesthesia: General | Site: Chest

## 2015-08-21 MED ORDER — BISACODYL 5 MG PO TBEC
10.0000 mg | DELAYED_RELEASE_TABLET | Freq: Every day | ORAL | Status: DC
  Administered 2015-08-22 – 2015-08-23 (×2): 10 mg via ORAL
  Filled 2015-08-21 (×2): qty 2

## 2015-08-21 MED ORDER — MORPHINE SULFATE (PF) 2 MG/ML IV SOLN
2.0000 mg | INTRAVENOUS | Status: DC | PRN
  Administered 2015-08-21 – 2015-08-22 (×3): 4 mg via INTRAVENOUS
  Filled 2015-08-21 (×3): qty 2

## 2015-08-21 MED ORDER — MIDAZOLAM HCL 5 MG/5ML IJ SOLN
INTRAMUSCULAR | Status: DC | PRN
  Administered 2015-08-21 (×2): 2 mg via INTRAVENOUS
  Administered 2015-08-21: 1 mg via INTRAVENOUS
  Administered 2015-08-21: 3 mg via INTRAVENOUS
  Administered 2015-08-21: 2 mg via INTRAVENOUS

## 2015-08-21 MED ORDER — LIDOCAINE HCL (CARDIAC) 20 MG/ML IV SOLN
INTRAVENOUS | Status: DC | PRN
  Administered 2015-08-21: 100 mg via INTRAVENOUS
  Administered 2015-08-21: 50 mg via INTRAVENOUS
  Administered 2015-08-21: 100 mg via INTRAVENOUS

## 2015-08-21 MED ORDER — BISACODYL 10 MG RE SUPP: 10.0000 mg | Freq: Every day | RECTAL | Status: DC

## 2015-08-21 MED ORDER — ACETAMINOPHEN 650 MG RE SUPP
650.0000 mg | Freq: Once | RECTAL | Status: AC
  Administered 2015-08-21: 650 mg via RECTAL

## 2015-08-21 MED ORDER — FENTANYL CITRATE (PF) 250 MCG/5ML IJ SOLN
INTRAMUSCULAR | Status: AC
  Filled 2015-08-21: qty 20

## 2015-08-21 MED ORDER — ANTISEPTIC ORAL RINSE SOLUTION (CORINZ)
7.0000 mL | Freq: Four times a day (QID) | OROMUCOSAL | Status: DC
  Administered 2015-08-22: 7 mL via OROMUCOSAL

## 2015-08-21 MED ORDER — TRAMADOL HCL 50 MG PO TABS: 50.0000 mg | ORAL_TABLET | ORAL | Status: DC | PRN

## 2015-08-21 MED ORDER — LACTATED RINGERS IV SOLN: INTRAVENOUS | Status: DC

## 2015-08-21 MED ORDER — METOPROLOL TARTRATE 25 MG/10 ML ORAL SUSPENSION: 12.5000 mg | Freq: Two times a day (BID) | ORAL | Status: DC

## 2015-08-21 MED ORDER — MIDAZOLAM HCL 2 MG/2ML IJ SOLN: 2.0000 mg | INTRAMUSCULAR | Status: DC | PRN

## 2015-08-21 MED ORDER — NITROGLYCERIN IN D5W 200-5 MCG/ML-% IV SOLN: 0.0000 ug/min | INTRAVENOUS | Status: DC

## 2015-08-21 MED ORDER — DEXTROSE 5 % IV SOLN
1.5000 g | Freq: Two times a day (BID) | INTRAVENOUS | Status: DC
  Administered 2015-08-21 – 2015-08-22 (×3): 1.5 g via INTRAVENOUS
  Filled 2015-08-21 (×4): qty 1.5

## 2015-08-21 MED ORDER — FENTANYL CITRATE (PF) 250 MCG/5ML IJ SOLN
INTRAMUSCULAR | Status: AC
  Filled 2015-08-21: qty 5

## 2015-08-21 MED ORDER — MAGNESIUM SULFATE 4 GM/100ML IV SOLN
4.0000 g | Freq: Once | INTRAVENOUS | Status: AC
  Administered 2015-08-21: 4 g via INTRAVENOUS
  Filled 2015-08-21: qty 100

## 2015-08-21 MED ORDER — ACETAMINOPHEN 500 MG PO TABS
1000.0000 mg | ORAL_TABLET | Freq: Four times a day (QID) | ORAL | Status: DC
  Administered 2015-08-22 – 2015-08-23 (×4): 1000 mg via ORAL
  Filled 2015-08-21 (×6): qty 2

## 2015-08-21 MED ORDER — ROCURONIUM BROMIDE 100 MG/10ML IV SOLN
INTRAVENOUS | Status: DC | PRN
  Administered 2015-08-21: 50 mg via INTRAVENOUS

## 2015-08-21 MED ORDER — PROTAMINE SULFATE 10 MG/ML IV SOLN
INTRAVENOUS | Status: DC | PRN
  Administered 2015-08-21 (×2): 40 mg via INTRAVENOUS
  Administered 2015-08-21: 10 mg via INTRAVENOUS

## 2015-08-21 MED ORDER — DOCUSATE SODIUM 100 MG PO CAPS
200.0000 mg | ORAL_CAPSULE | Freq: Every day | ORAL | Status: DC
  Administered 2015-08-22 – 2015-08-23 (×2): 200 mg via ORAL
  Filled 2015-08-21 (×4): qty 2

## 2015-08-21 MED ORDER — IODIXANOL 320 MG/ML IV SOLN
INTRAVENOUS | Status: DC | PRN
  Administered 2015-08-21: 90 mL via INTRAVENOUS

## 2015-08-21 MED ORDER — STERILE WATER FOR INJECTION IJ SOLN
INTRAMUSCULAR | Status: AC
  Filled 2015-08-21: qty 10

## 2015-08-21 MED ORDER — FENTANYL CITRATE (PF) 100 MCG/2ML IJ SOLN
INTRAMUSCULAR | Status: DC | PRN
  Administered 2015-08-21: 250 ug via INTRAVENOUS
  Administered 2015-08-21: 650 ug via INTRAVENOUS
  Administered 2015-08-21: 250 ug via INTRAVENOUS
  Administered 2015-08-21: 100 ug via INTRAVENOUS

## 2015-08-21 MED ORDER — SODIUM CHLORIDE 0.9 % IJ SOLN: 3.0000 mL | INTRAMUSCULAR | Status: DC | PRN

## 2015-08-21 MED ORDER — PROPOFOL 10 MG/ML IV BOLUS
INTRAVENOUS | Status: DC | PRN
  Administered 2015-08-21: 20 mg via INTRAVENOUS

## 2015-08-21 MED ORDER — SODIUM CHLORIDE 0.9 % IV SOLN
INTRAVENOUS | Status: DC
  Administered 2015-08-21: 18:00:00 via INTRAVENOUS

## 2015-08-21 MED ORDER — CHLORHEXIDINE GLUCONATE 0.12% ORAL RINSE (MEDLINE KIT)
15.0000 mL | Freq: Two times a day (BID) | OROMUCOSAL | Status: DC
  Administered 2015-08-21: 15 mL via OROMUCOSAL

## 2015-08-21 MED ORDER — ALBUMIN HUMAN 5 % IV SOLN: 250.0000 mL | INTRAVENOUS | Status: AC | PRN

## 2015-08-21 MED ORDER — PROTAMINE SULFATE 10 MG/ML IV SOLN
INTRAVENOUS | Status: AC
  Filled 2015-08-21: qty 25

## 2015-08-21 MED ORDER — HEPARIN SODIUM (PORCINE) 1000 UNIT/ML IJ SOLN
INTRAMUSCULAR | Status: AC
  Filled 2015-08-21: qty 1

## 2015-08-21 MED ORDER — ONDANSETRON HCL 4 MG/2ML IJ SOLN
4.0000 mg | Freq: Four times a day (QID) | INTRAMUSCULAR | Status: DC | PRN
  Administered 2015-08-22 (×3): 4 mg via INTRAVENOUS
  Filled 2015-08-21 (×4): qty 2

## 2015-08-21 MED ORDER — SODIUM CHLORIDE 0.9 % IJ SOLN
OROMUCOSAL | Status: DC | PRN
  Administered 2015-08-21 (×3): 4 mL via TOPICAL

## 2015-08-21 MED ORDER — FAMOTIDINE IN NACL 20-0.9 MG/50ML-% IV SOLN
20.0000 mg | Freq: Two times a day (BID) | INTRAVENOUS | Status: DC
  Administered 2015-08-21: 20 mg via INTRAVENOUS

## 2015-08-21 MED ORDER — ACETAMINOPHEN 160 MG/5ML PO SOLN: 650.0000 mg | Freq: Once | ORAL | Status: AC

## 2015-08-21 MED ORDER — ASPIRIN 81 MG PO CHEW
324.0000 mg | CHEWABLE_TABLET | Freq: Every day | ORAL | Status: DC
  Administered 2015-08-22: 324 mg
  Filled 2015-08-21 (×2): qty 4

## 2015-08-21 MED ORDER — ASPIRIN EC 325 MG PO TBEC
325.0000 mg | DELAYED_RELEASE_TABLET | Freq: Every day | ORAL | Status: DC
  Administered 2015-08-23 – 2015-08-25 (×3): 325 mg via ORAL
  Filled 2015-08-21 (×3): qty 1

## 2015-08-21 MED ORDER — PROPOFOL 10 MG/ML IV BOLUS
INTRAVENOUS | Status: AC
  Filled 2015-08-21: qty 20

## 2015-08-21 MED ORDER — ARTIFICIAL TEARS OP OINT
TOPICAL_OINTMENT | OPHTHALMIC | Status: DC | PRN
  Administered 2015-08-21: 1 via OPHTHALMIC

## 2015-08-21 MED ORDER — HEPARIN SODIUM (PORCINE) 1000 UNIT/ML IJ SOLN
INTRAMUSCULAR | Status: DC | PRN
  Administered 2015-08-21: 12000 [IU] via INTRAVENOUS

## 2015-08-21 MED ORDER — POTASSIUM CHLORIDE 10 MEQ/50ML IV SOLN
10.0000 meq | INTRAVENOUS | Status: AC
  Administered 2015-08-21 (×3): 10 meq via INTRAVENOUS

## 2015-08-21 MED ORDER — ARTIFICIAL TEARS OP OINT
TOPICAL_OINTMENT | OPHTHALMIC | Status: AC
  Filled 2015-08-21: qty 3.5

## 2015-08-21 MED ORDER — LACTATED RINGERS IV SOLN: 500.0000 mL | Freq: Once | INTRAVENOUS | Status: DC | PRN

## 2015-08-21 MED ORDER — PANTOPRAZOLE SODIUM 40 MG PO TBEC
40.0000 mg | DELAYED_RELEASE_TABLET | Freq: Every day | ORAL | Status: DC
  Administered 2015-08-23 – 2015-08-25 (×3): 40 mg via ORAL
  Filled 2015-08-21 (×4): qty 1

## 2015-08-21 MED ORDER — ROCURONIUM BROMIDE 50 MG/5ML IV SOLN
INTRAVENOUS | Status: AC
  Filled 2015-08-21: qty 2

## 2015-08-21 MED ORDER — SODIUM CHLORIDE 0.45 % IV SOLN
INTRAVENOUS | Status: DC | PRN
  Administered 2015-08-21: 18:00:00 via INTRAVENOUS

## 2015-08-21 MED ORDER — VECURONIUM BROMIDE 10 MG IV SOLR
INTRAVENOUS | Status: AC
  Filled 2015-08-21: qty 10

## 2015-08-21 MED ORDER — SODIUM CHLORIDE 0.9 % IJ SOLN
3.0000 mL | Freq: Two times a day (BID) | INTRAMUSCULAR | Status: DC
  Administered 2015-08-22 – 2015-08-25 (×7): 3 mL via INTRAVENOUS

## 2015-08-21 MED ORDER — INSULIN ASPART 100 UNIT/ML ~~LOC~~ SOLN
0.0000 [IU] | SUBCUTANEOUS | Status: DC
  Administered 2015-08-21 – 2015-08-22 (×2): 2 [IU] via SUBCUTANEOUS
  Administered 2015-08-22: 4 [IU] via SUBCUTANEOUS
  Administered 2015-08-22: 2 [IU] via SUBCUTANEOUS

## 2015-08-21 MED ORDER — INSULIN REGULAR BOLUS VIA INFUSION
0.0000 [IU] | Freq: Three times a day (TID) | INTRAVENOUS | Status: DC
  Filled 2015-08-21: qty 10

## 2015-08-21 MED ORDER — METOPROLOL TARTRATE 1 MG/ML IV SOLN: 2.5000 mg | INTRAVENOUS | Status: DC | PRN

## 2015-08-21 MED ORDER — DEXMEDETOMIDINE HCL IN NACL 200 MCG/50ML IV SOLN: 0.0000 ug/kg/h | INTRAVENOUS | Status: DC

## 2015-08-21 MED ORDER — HEMOSTATIC AGENTS (NO CHARGE) OPTIME
TOPICAL | Status: DC | PRN
  Administered 2015-08-21: 1 via TOPICAL

## 2015-08-21 MED ORDER — MIDAZOLAM HCL 10 MG/2ML IJ SOLN
INTRAMUSCULAR | Status: AC
  Filled 2015-08-21: qty 2

## 2015-08-21 MED ORDER — ACETAMINOPHEN 160 MG/5ML PO SOLN
1000.0000 mg | Freq: Four times a day (QID) | ORAL | Status: DC
  Administered 2015-08-21: 1000 mg
  Filled 2015-08-21: qty 40.6

## 2015-08-21 MED ORDER — LACTATED RINGERS IV SOLN
INTRAVENOUS | Status: DC | PRN
  Administered 2015-08-21 (×2): via INTRAVENOUS

## 2015-08-21 MED ORDER — MORPHINE SULFATE (PF) 2 MG/ML IV SOLN
1.0000 mg | INTRAVENOUS | Status: AC | PRN
  Administered 2015-08-22: 4 mg via INTRAVENOUS
  Filled 2015-08-21: qty 2

## 2015-08-21 MED ORDER — CHLORHEXIDINE GLUCONATE 0.12 % MT SOLN
15.0000 mL | OROMUCOSAL | Status: AC
  Administered 2015-08-21: 15 mL via OROMUCOSAL

## 2015-08-21 MED ORDER — METOPROLOL TARTRATE 12.5 MG HALF TABLET
12.5000 mg | ORAL_TABLET | Freq: Two times a day (BID) | ORAL | Status: DC
  Administered 2015-08-22: 12.5 mg via ORAL
  Filled 2015-08-21: qty 1

## 2015-08-21 MED ORDER — OXYCODONE HCL 5 MG PO TABS: 5.0000 mg | ORAL_TABLET | ORAL | Status: DC | PRN

## 2015-08-21 MED ORDER — VECURONIUM BROMIDE 10 MG IV SOLR
INTRAVENOUS | Status: DC | PRN
  Administered 2015-08-21 (×3): 5 mg via INTRAVENOUS

## 2015-08-21 MED ORDER — SODIUM CHLORIDE 0.9 % IV SOLN: 250.0000 mL | INTRAVENOUS | Status: DC

## 2015-08-21 MED ORDER — SODIUM CHLORIDE 0.9 % IV SOLN
INTRAVENOUS | Status: DC
  Filled 2015-08-21: qty 2.5

## 2015-08-21 MED ORDER — 0.9 % SODIUM CHLORIDE (POUR BTL) OPTIME
TOPICAL | Status: DC | PRN
  Administered 2015-08-21: 6000 mL

## 2015-08-21 MED ORDER — PROTAMINE SULFATE 10 MG/ML IV SOLN
INTRAVENOUS | Status: AC
  Filled 2015-08-21: qty 5

## 2015-08-21 MED ORDER — CLOPIDOGREL BISULFATE 300 MG PO TABS
600.0000 mg | ORAL_TABLET | Freq: Once | ORAL | Status: DC
  Filled 2015-08-21: qty 2

## 2015-08-21 MED ORDER — VANCOMYCIN HCL IN DEXTROSE 1-5 GM/200ML-% IV SOLN
1000.0000 mg | Freq: Once | INTRAVENOUS | Status: AC
  Administered 2015-08-21: 1000 mg via INTRAVENOUS
  Filled 2015-08-21: qty 200

## 2015-08-21 MED ORDER — CLOPIDOGREL BISULFATE 75 MG PO TABS
75.0000 mg | ORAL_TABLET | Freq: Every day | ORAL | Status: DC
  Administered 2015-08-22 – 2015-08-25 (×4): 75 mg via ORAL
  Filled 2015-08-21 (×4): qty 1

## 2015-08-21 MED ORDER — CLOPIDOGREL BISULFATE 300 MG PO TABS
600.0000 mg | ORAL_TABLET | Freq: Once | ORAL | Status: AC
  Administered 2015-08-21: 600 mg via ORAL
  Filled 2015-08-21: qty 2

## 2015-08-21 MED ORDER — PHENYLEPHRINE HCL 10 MG/ML IJ SOLN
0.0000 ug/min | INTRAVENOUS | Status: DC
  Filled 2015-08-21: qty 2

## 2015-08-21 MED FILL — Heparin Sodium (Porcine) 2 Unit/ML in Sodium Chloride 0.9%: INTRAMUSCULAR | Qty: 500 | Status: AC

## 2015-08-21 MED FILL — Heparin Sodium (Porcine) Inj 1000 Unit/ML: INTRAMUSCULAR | Qty: 30 | Status: AC

## 2015-08-21 MED FILL — Potassium Chloride Inj 2 mEq/ML: INTRAVENOUS | Qty: 40 | Status: AC

## 2015-08-21 MED FILL — Magnesium Sulfate Inj 50%: INTRAMUSCULAR | Qty: 10 | Status: AC

## 2015-08-21 SURGICAL SUPPLY — 138 items
ADAPTER CARDIO PERF ANTE/RETRO (ADAPTER) IMPLANT
ADPR PRFSN 84XANTGRD RTRGD (ADAPTER)
APPLIER CLIP 9.375 MED OPEN (MISCELLANEOUS)
APPLIER CLIP 9.375 SM OPEN (CLIP)
APR CLP MED 9.3 20 MLT OPN (MISCELLANEOUS)
APR CLP SM 9.3 20 MLT OPN (CLIP)
BAG DECANTER FOR FLEXI CONT (MISCELLANEOUS) ×3 IMPLANT
BAG SNAP BAND KOVER 36X36 (MISCELLANEOUS) ×1 IMPLANT
BANDAGE ACE 4X5 VEL STRL LF (GAUZE/BANDAGES/DRESSINGS) ×1 IMPLANT
BANDAGE ACE 6X5 VEL STRL LF (GAUZE/BANDAGES/DRESSINGS) ×1 IMPLANT
BANDAGE ELASTIC 4 VELCRO ST LF (GAUZE/BANDAGES/DRESSINGS) ×3 IMPLANT
BANDAGE ELASTIC 6 VELCRO ST LF (GAUZE/BANDAGES/DRESSINGS) ×3 IMPLANT
BLADE STERNUM SYSTEM 6 (BLADE) ×3 IMPLANT
BLADE SURG 11 STRL SS (BLADE) IMPLANT
BLADE SURG ROTATE 9660 (MISCELLANEOUS) ×1 IMPLANT
BNDG GAUZE ELAST 4 BULKY (GAUZE/BANDAGES/DRESSINGS) ×3 IMPLANT
CANISTER SUCTION 2500CC (MISCELLANEOUS) ×3 IMPLANT
CANNULA VESSEL W/WING WO/VALVE (CANNULA) IMPLANT
CARDIOBLATE CARDIAC ABLATION (MISCELLANEOUS)
CATH CPB KIT GERHARDT (MISCELLANEOUS) ×3 IMPLANT
CATH RETROPLEGIA CORONARY 14FR (CATHETERS) IMPLANT
CATH ROBINSON RED A/P 18FR (CATHETERS) IMPLANT
CATH THORACIC 28FR (CATHETERS) ×3 IMPLANT
CLIP APPLIE 9.375 MED OPEN (MISCELLANEOUS) IMPLANT
CLIP APPLIE 9.375 SM OPEN (CLIP) IMPLANT
CLIP FOGARTY SPRING 6M (CLIP) IMPLANT
CLIP RETRACTION 3.0MM CORONARY (MISCELLANEOUS) IMPLANT
CLIP TI MEDIUM 24 (CLIP) IMPLANT
CLIP TI WIDE RED SMALL 24 (CLIP) IMPLANT
CONN Y 3/8X3/8X3/8  BEN (MISCELLANEOUS)
CONN Y 3/8X3/8X3/8 BEN (MISCELLANEOUS) IMPLANT
COVER PROBE W GEL 5X96 (DRAPES) ×1 IMPLANT
CRADLE DONUT ADULT HEAD (MISCELLANEOUS) ×2 IMPLANT
DEVICE CARDIOBLATE CARDIAC ABL (MISCELLANEOUS) IMPLANT
DEVICE INFLATION ENCORE 26 (MISCELLANEOUS) ×1 IMPLANT
DRAIN CHANNEL 28F RND 3/8 FF (WOUND CARE) ×3 IMPLANT
DRAIN CHANNEL 32F RND 10.7 FF (WOUND CARE) IMPLANT
DRAPE CARDIOVASCULAR INCISE (DRAPES) ×3
DRAPE SLUSH/WARMER DISC (DRAPES) ×3 IMPLANT
DRAPE SRG 135X102X78XABS (DRAPES) ×2 IMPLANT
DRSG AQUACEL AG ADV 3.5X14 (GAUZE/BANDAGES/DRESSINGS) ×3 IMPLANT
ELECT BLADE 4.0 EZ CLEAN MEGAD (MISCELLANEOUS) ×3
ELECT CAUTERY BLADE 6.4 (BLADE) IMPLANT
ELECT REM PT RETURN 9FT ADLT (ELECTROSURGICAL) ×6
ELECTRODE BLDE 4.0 EZ CLN MEGD (MISCELLANEOUS) ×2 IMPLANT
ELECTRODE REM PT RTRN 9FT ADLT (ELECTROSURGICAL) ×4 IMPLANT
GAUZE SPONGE 4X4 12PLY STRL (GAUZE/BANDAGES/DRESSINGS) ×6 IMPLANT
GEL ULTRASOUND 20GR AQUASONIC (MISCELLANEOUS) ×1 IMPLANT
GLOVE BIO SURGEON ST LM GN SZ9 (GLOVE) ×1 IMPLANT
GLOVE BIO SURGEON STRL SZ 6.5 (GLOVE) ×13 IMPLANT
GLOVE BIO SURGEON STRL SZ7.5 (GLOVE) ×2 IMPLANT
GLOVE BIO SURGEON STRL SZ8 (GLOVE) ×1 IMPLANT
GLOVE BIOGEL PI IND STRL 6.5 (GLOVE) IMPLANT
GLOVE BIOGEL PI IND STRL 7.0 (GLOVE) IMPLANT
GLOVE BIOGEL PI INDICATOR 6.5 (GLOVE) ×2
GLOVE BIOGEL PI INDICATOR 7.0 (GLOVE) ×2
GOWN STRL REUS W/ TWL LRG LVL3 (GOWN DISPOSABLE) ×8 IMPLANT
GOWN STRL REUS W/TWL LRG LVL3 (GOWN DISPOSABLE) ×30
HEMOSTAT POWDER SURGIFOAM 1G (HEMOSTASIS) ×9 IMPLANT
HEMOSTAT SURGICEL 2X14 (HEMOSTASIS) ×3 IMPLANT
INSERT FOGARTY XLG (MISCELLANEOUS) IMPLANT
KIT BASIN OR (CUSTOM PROCEDURE TRAY) ×3 IMPLANT
KIT CATH SUCT 8FR (CATHETERS) ×3 IMPLANT
KIT ROOM TURNOVER OR (KITS) ×3 IMPLANT
KIT SUCTION CATH 14FR (SUCTIONS) ×6 IMPLANT
KIT VASOVIEW W/TROCAR VH 2000 (KITS) ×3 IMPLANT
LEAD PACING MYOCARDI (MISCELLANEOUS) ×3 IMPLANT
LINE EXTENSION DELIVERY (MISCELLANEOUS) ×1 IMPLANT
LINE VENT (MISCELLANEOUS) IMPLANT
MARKER GRAFT CORONARY BYPASS (MISCELLANEOUS) ×8 IMPLANT
NS IRRIG 1000ML POUR BTL (IV SOLUTION) ×16 IMPLANT
PACK OPEN HEART (CUSTOM PROCEDURE TRAY) ×3 IMPLANT
PAD ARMBOARD 7.5X6 YLW CONV (MISCELLANEOUS) ×8 IMPLANT
PAD ELECT DEFIB RADIOL ZOLL (MISCELLANEOUS) ×3 IMPLANT
PENCIL BUTTON HOLSTER BLD 10FT (ELECTRODE) ×3 IMPLANT
PUNCH AORTIC ROT 4.0MM RCL 40 (MISCELLANEOUS) ×1 IMPLANT
PUNCH AORTIC ROTATE 4.0MM (MISCELLANEOUS) IMPLANT
PUNCH AORTIC ROTATE 4.5MM 8IN (MISCELLANEOUS) IMPLANT
PUNCH AORTIC ROTATE 5MM 8IN (MISCELLANEOUS) IMPLANT
SET CARDIOPLEGIA MPS 5001102 (MISCELLANEOUS) IMPLANT
SHEATH PINNACLE 6F 10CM (SHEATH) ×1 IMPLANT
SOLUTION ANTI FOG 6CC (MISCELLANEOUS) ×3 IMPLANT
SPONGE LAP 18X18 X RAY DECT (DISPOSABLE) ×1 IMPLANT
STABILIZER COHN CARD 89 9340 (MISCELLANEOUS) IMPLANT
STOPCOCK 4 WAY LG BORE MALE ST (IV SETS) ×1 IMPLANT
STOPCOCK MORSE 400PSI 3WAY (MISCELLANEOUS) ×1 IMPLANT
SUT BONE WAX W31G (SUTURE) ×3 IMPLANT
SUT ETHIBOND 2 0 SH (SUTURE) ×12
SUT ETHIBOND 2 0 SH 36X2 (SUTURE) IMPLANT
SUT MNCRL AB 4-0 PS2 18 (SUTURE) IMPLANT
SUT PROLENE 3 0 SH 1 (SUTURE) IMPLANT
SUT PROLENE 3 0 SH1 36 (SUTURE) ×3 IMPLANT
SUT PROLENE 4 0 RB 1 (SUTURE) ×3
SUT PROLENE 4 0 TF (SUTURE) ×6 IMPLANT
SUT PROLENE 4-0 RB1 .5 CRCL 36 (SUTURE) IMPLANT
SUT PROLENE 5 0 C 1 36 (SUTURE) ×2 IMPLANT
SUT PROLENE 6 0 C 1 30 (SUTURE) ×2 IMPLANT
SUT PROLENE 6 0 CC (SUTURE) ×9 IMPLANT
SUT PROLENE 7 0 BV1 MDA (SUTURE) ×3 IMPLANT
SUT PROLENE 8 0 BV175 6 (SUTURE) ×2 IMPLANT
SUT SILK  1 MH (SUTURE) ×4
SUT SILK 1 MH (SUTURE) ×6 IMPLANT
SUT SILK 1 TIES 10X30 (SUTURE) ×1 IMPLANT
SUT SILK 2 0 FS (SUTURE) ×1 IMPLANT
SUT SILK 2 0 SH CR/8 (SUTURE) ×2 IMPLANT
SUT SILK 2 0 TIES 10X30 (SUTURE) ×1 IMPLANT
SUT SILK 2 0 TIES 17X18 (SUTURE) ×3
SUT SILK 2-0 18XBRD TIE BLK (SUTURE) IMPLANT
SUT SILK 3 0 SH CR/8 (SUTURE) ×1 IMPLANT
SUT SILK 4 0 TIE 10X30 (SUTURE) ×2 IMPLANT
SUT STEEL 6MS V (SUTURE) ×3 IMPLANT
SUT STEEL STERNAL CCS#1 18IN (SUTURE) IMPLANT
SUT STEEL SZ 6 DBL 3X14 BALL (SUTURE) ×3 IMPLANT
SUT TEM PAC WIRE 2 0 SH (SUTURE) ×4 IMPLANT
SUT VIC AB 1 CTX 18 (SUTURE) ×6 IMPLANT
SUT VIC AB 2-0 CT1 27 (SUTURE) ×3
SUT VIC AB 2-0 CT1 TAPERPNT 27 (SUTURE) IMPLANT
SUT VIC AB 2-0 CTX 27 (SUTURE) ×2 IMPLANT
SUT VIC AB 3-0 SH 27 (SUTURE)
SUT VIC AB 3-0 SH 27X BRD (SUTURE) IMPLANT
SUT VIC AB 3-0 X1 27 (SUTURE) ×3 IMPLANT
SUT VICRYL 4-0 PS2 18IN ABS (SUTURE) IMPLANT
SUTURE E-PAK OPEN HEART (SUTURE) ×2 IMPLANT
SYS GUIDANT ACHIEVE OFF PUMP (MISCELLANEOUS) ×1 IMPLANT
SYSTEM SAHARA CHEST DRAIN ATS (WOUND CARE) ×3 IMPLANT
TABLE PACK (MISCELLANEOUS) ×1 IMPLANT
TAPES RETRACTO (MISCELLANEOUS) ×1 IMPLANT
TOWEL OR 17X24 6PK STRL BLUE (TOWEL DISPOSABLE) ×6 IMPLANT
TOWEL OR 17X26 10 PK STRL BLUE (TOWEL DISPOSABLE) ×6 IMPLANT
TRAY CATH LUMEN 1 20CM STRL (SET/KITS/TRAYS/PACK) ×1 IMPLANT
TRAY FOLEY IC TEMP SENS 16FR (CATHETERS) ×3 IMPLANT
TUBING ART PRESS 48 MALE/FEM (TUBING) ×2 IMPLANT
TUBING CIL FLEX 10 FLL-RA (TUBING) ×1 IMPLANT
TUBING INSUFFLATION (TUBING) ×3 IMPLANT
UNDERPAD 30X30 INCONTINENT (UNDERPADS AND DIAPERS) ×3 IMPLANT
WAND VISUFLOW SURG W/TUBING (MISCELLANEOUS) IMPLANT
WATER STERILE IRR 1000ML POUR (IV SOLUTION) ×6 IMPLANT
WIRE .035 3MM-J 145CM (WIRE) ×1 IMPLANT

## 2015-08-21 SURGICAL SUPPLY — 15 items
BALLN EMERGE MR 3.0X15 (BALLOONS) ×3
BALLN ~~LOC~~ EMERGE MR 3.75X12 (BALLOONS) ×3
BALLOON EMERGE MR 3.0X15 (BALLOONS) IMPLANT
BALLOON ~~LOC~~ EMERGE MR 3.75X12 (BALLOONS) IMPLANT
CATH INFINITI 5 FR IM (CATHETERS) ×1 IMPLANT
GUIDE CATH RUNWAY 6FR FR4 (CATHETERS) ×1 IMPLANT
KIT ENCORE 26 ADVANTAGE (KITS) ×3 IMPLANT
KIT HEART LEFT (KITS) ×3 IMPLANT
PACK CARDIAC CATHETERIZATION (CUSTOM PROCEDURE TRAY) ×3 IMPLANT
SHEATH PINNACLE 6F 10CM (SHEATH) ×1 IMPLANT
STENT SYNERGY DES 3.5X16 (Permanent Stent) ×1 IMPLANT
TRANSDUCER W/STOPCOCK (MISCELLANEOUS) ×3 IMPLANT
TUBING CIL FLEX 10 FLL-RA (TUBING) ×3 IMPLANT
VALVE GUARDIAN II ~~LOC~~ HEMO (MISCELLANEOUS) ×1 IMPLANT
WIRE ASAHI PROWATER 180CM (WIRE) ×1 IMPLANT

## 2015-08-21 NOTE — Anesthesia Procedure Notes (Addendum)
Central Venous Catheter Insertion Performed by: anesthesiologist 08/21/2015 9:31 AM Patient location: Pre-op. Preanesthetic checklist: patient identified, IV checked, site marked, risks and benefits discussed, surgical consent, monitors and equipment checked, pre-op evaluation and timeout performed Position: Trendelenburg Lidocaine 1% used for infiltration Landmarks identified and Seldinger technique used Catheter size: 8.5 Fr PA cath was placed.Sheath introducer Swan type and PA catheter depth:thermodilationProcedure performed using ultrasound guided technique. Attempts: 1 Following insertion, line sutured and dressing applied. Post procedure assessment: blood return through all ports, free fluid flow and no air. Patient tolerated the procedure well with no immediate complications. Additional procedure comments: PA catheter:  Routine monitors. Timeout, sterile prep, drape, FBP R neck.  Trendelenburg position.  1% Lido local, finder and trocar RIJ 1st pass with US guidance.  Cordis placed over J wire. PA catheter in easily.  Sterile dressing applied.  Patient tolerated well, VSS.  Jenita Seashore, MD.    Procedure Name: Intubation Date/Time: 08/21/2015 10:54 AM Performed by: Gaylene Brooks Pre-anesthesia Checklist: Patient identified, Emergency Drugs available, Suction available, Patient being monitored and Timeout performed Patient Re-evaluated:Patient Re-evaluated prior to inductionOxygen Delivery Method: Circle system utilized Preoxygenation: Pre-oxygenation with 100% oxygen Intubation Type: IV induction Ventilation: Mask ventilation without difficulty Laryngoscope Size: Miller and 2 Grade View: Grade II Tube type: Oral Tube size: 8.0 mm Number of attempts: 1 Airway Equipment and Method: Stylet Placement Confirmation: ETT inserted through vocal cords under direct vision,  positive ETCO2,  CO2 detector and breath sounds checked- equal and bilateral Secured at: 22 cm Tube secured with:  Tape Dental Injury: Teeth and Oropharynx as per pre-operative assessment

## 2015-08-21 NOTE — Progress Notes (Addendum)
Rt femoral sheath pulled at 2106. Pressure held for 20 minutes. Rt groin Level 1 prior to sheath pull and level 1 post sheath pull. Pt stable throughout the procedure. Pressure dressing applied. Pt still intubated and sedated during sheath pull, unable to give post sheath pull instructions.Primary RN to monitor site.

## 2015-08-21 NOTE — Anesthesia Preprocedure Evaluation (Addendum)
Anesthesia Evaluation  Patient identified by MRN, date of birth, ID band Patient awake    Reviewed: Allergy & Precautions, NPO status , Patient's Chart, lab work & pertinent test results, reviewed documented beta blocker date and time   History of Anesthesia Complications Negative for: history of anesthetic complications  Airway Mallampati: II  TM Distance: >3 FB Neck ROM: Full    Dental  (+) Dental Advisory Given   Pulmonary neg pulmonary ROS,    breath sounds clear to auscultation       Cardiovascular hypertension, Pt. on medications and Pt. on home beta blockers + angina with exertion + CAD (LAD and RCA)   Rhythm:Regular Rate:Normal  08/17/15 ECHO:  EF 55-60%   Neuro/Psych negative neurological ROS     GI/Hepatic Neg liver ROS, hiatal hernia,   Endo/Other  Morbid obesity  Renal/GU Renal InsufficiencyRenal disease (creat 1.54)     Musculoskeletal   Abdominal (+) + obese,   Peds  Hematology negative hematology ROS (+)   Anesthesia Other Findings   Reproductive/Obstetrics                            Anesthesia Physical Anesthesia Plan  ASA: III  Anesthesia Plan: General   Post-op Pain Management:    Induction: Intravenous  Airway Management Planned: LMA C-Track Planned  Additional Equipment: Arterial line, PA Cath, TEE and Ultrasound Guidance Line Placement  Intra-op Plan:   Post-operative Plan: Post-operative intubation/ventilation  Informed Consent: I have reviewed the patients History and Physical, chart, labs and discussed the procedure including the risks, benefits and alternatives for the proposed anesthesia with the patient or authorized representative who has indicated his/her understanding and acceptance.   Dental advisory given  Plan Discussed with: Surgeon and CRNA  Anesthesia Plan Comments: (Pl;an routine monitors, A line, PA cath, GETA with TEE and post op  ventilation)        Anesthesia Quick Evaluation

## 2015-08-21 NOTE — Progress Notes (Signed)
*  PRELIMINARY RESULTS* Echocardiogram 2D Echocardiogram has been performed.  Leavy Cella 08/21/2015, 11:49 AM

## 2015-08-21 NOTE — Procedures (Signed)
Extubation Procedure Note  Patient Details:   Name: Jeffrey Holmes DOB: 1951-07-24 MRN: [id number]   Airway Documentation:  Pre Extubation: Pt completed Rapid Wean without complication. Pt followed all commands. NIF -30, VC 916mls. Cuff leak noted. ABG within acceptable range.  Post Extubation: Pt on 2L [place] (Sats 96%) Clearly speaks name/location. No Stridor. Clear BBS.   Evaluation  O2 sats: stable throughout Complications: No apparent complications Patient did tolerate procedure well. Bilateral Breath Sounds: Clear   Yes  Jeffrey Holmes, [name] 08/21/2015, 11:37 PM

## 2015-08-21 NOTE — Brief Op Note (Addendum)
08/17/2015 - 08/21/2015  3:06 PM      [address]       [place] [address]             [phone]    08/21/2015  3:06 PM  PATIENT:  Jeffrey Holmes  65 y.o. male  PRE-OPERATIVE DIAGNOSIS:  CAD  POST-OPERATIVE DIAGNOSIS:  CAD  PROCEDURE:  Procedure(s): TRANSESOPHAGEAL ECHOCARDIOGRAM (TEE) OFF PUMP CORONARY ARTERY BYPASS GRAFTING (CABG), TIMES TWO, USING LEFT INTERNAL MAMMARY ARTERY, RIGHT GREATER SAPHENOUS VEIN HARVESTED OPEN TECHNIQUE. (LIMA-LAD;SVG-DIAG) ANGIOPLASTY WITH PERCUTANEOUS CORONARY INTERVENTION WITH STENT TO RIGHT CORONARY ARTERY  SURGEON:  Surgeon(s): [name], MD Jettie Booze, MD  PHYSICIAN ASSISTANT: WAYNE GOLD PA-C  ANESTHESIA:   general  PATIENT CONDITION:  ICU - intubated and hemodynamically stable.  PRE-OPERATIVE WEIGHT: [id number]  COMPLICATIONS: NO KNOWN

## 2015-08-21 NOTE — Progress Notes (Signed)
Patient ID: NIZAIAH SCHOEPP, male   DOB: [birth year], 65 y.o.   MRN: [id number]  SICU Evening Rounds:   Hemodynamically stable  CI = 1.9  Has started to wake up on vent.  Urine output good  CT output low  CBC    Component Value Date/Time   WBC 11.1* 08/21/2015 1730   RBC 4.27 08/21/2015 1730   HGB 12.6* 08/21/2015 1742   HCT 37.0* 08/21/2015 1742   PLT 191 08/21/2015 1730   MCV 89.7 08/21/2015 1730   MCH 30.7 08/21/2015 1730   MCHC 34.2 08/21/2015 1730   RDW 13.1 08/21/2015 1730   LYMPHSABS 2.7 08/17/2015 1644   MONOABS 0.9 08/17/2015 1644   EOSABS 0.3 08/17/2015 1644   BASOSABS 0.0 08/17/2015 1644     BMET    Component Value Date/Time   NA 144 08/21/2015 1742   K 3.5 08/21/2015 1742   CL 110 08/21/2015 1632   CO2 23 08/20/2015 0523   GLUCOSE 102* 08/21/2015 1742   BUN 15 08/21/2015 1632   CREATININE 1.20 08/21/2015 1632   CREATININE 1.56* 08/16/2015 1048   CALCIUM 9.2 08/20/2015 0523   GFRNONAA 46* 08/20/2015 0523   GFRAA 53* 08/20/2015 0523     A/P:  Stable postop course. Continue current plans

## 2015-08-21 NOTE — Transfer of Care (Signed)
Immediate Anesthesia Transfer of Care Note  Patient: Jeffrey Holmes  Procedure(s) Performed: Procedure(s): TRANSESOPHAGEAL ECHOCARDIOGRAM (TEE) (N/A) OFF PUMP CORONARY ARTERY BYPASS GRAFTING (CABG), TIMES TWO, USING LEFT INTERNAL MAMMARY ARTERY, RIGHT GREATER SAPHENOUS VEIN HARVESTED ENDOSCOPICALLY (N/A) ANGIOPLASTY WITH PERCUTANEOUS CORONARY INTERVENTION WITH DES TO RIGHT CORONARY ARTERY (N/A)  Patient Location: SICU  Anesthesia Type:General  Level of Consciousness: Patient remains intubated per anesthesia plan  Airway & Oxygen Therapy: Patient remains intubated per anesthesia plan and Patient placed on Ventilator (see vital sign flow sheet for setting)  Post-op Assessment: Report given to RN and Post -op Vital signs reviewed and stable  Post vital signs: Reviewed and stable  Last Vitals:  Filed Vitals:   08/21/15 0500 08/21/15 1725  BP: 143/85 118/71  Pulse: 67 80  Temp: 36.4 C   Resp:  18    Complications: No apparent anesthesia complications

## 2015-08-21 NOTE — Progress Notes (Signed)
RT NOTE:  Cardiac Rapid Wean started. Pt opens eyes and nods head with understanding to begin vent adjustments

## 2015-08-22 ENCOUNTER — Inpatient Hospital Stay (HOSPITAL_COMMUNITY): Payer: Medicare Other

## 2015-08-22 ENCOUNTER — Encounter (HOSPITAL_COMMUNITY): Payer: Self-pay | Admitting: Interventional Cardiology

## 2015-08-22 DIAGNOSIS — R9431 Abnormal electrocardiogram [ECG] [EKG]: Secondary | ICD-10-CM

## 2015-08-22 DIAGNOSIS — Z955 Presence of coronary angioplasty implant and graft: Secondary | ICD-10-CM

## 2015-08-22 LAB — POCT I-STAT, CHEM 8
BUN: 14 mg/dL (ref 6–20)
CALCIUM ION: 1.2 mmol/L (ref 1.13–1.30)
Chloride: 106 mmol/L (ref 101–111)
Creatinine, Ser: 1.5 mg/dL — ABNORMAL HIGH (ref 0.61–1.24)
Glucose, Bld: 152 mg/dL — ABNORMAL HIGH (ref 65–99)
HEMATOCRIT: 38 % — AB (ref 39.0–52.0)
HEMOGLOBIN: 12.9 g/dL — AB (ref 13.0–17.0)
Potassium: 3.8 mmol/L (ref 3.5–5.1)
SODIUM: 143 mmol/L (ref 135–145)
TCO2: 22 mmol/L (ref 0–100)

## 2015-08-22 LAB — CREATININE, SERUM
Creatinine, Ser: 1.7 mg/dL — ABNORMAL HIGH (ref 0.61–1.24)
GFR calc non Af Amer: 41 mL/min — ABNORMAL LOW (ref >60)
GFR, EST AFRICAN AMERICAN: 47 mL/min — AB (ref >60)

## 2015-08-22 LAB — GLUCOSE, CAPILLARY
GLUCOSE-CAPILLARY: 195 mg/dL — AB (ref 65–99)
Glucose-Capillary: 107 mg/dL — ABNORMAL HIGH (ref 65–99)
Glucose-Capillary: 116 mg/dL — ABNORMAL HIGH (ref 65–99)
Glucose-Capillary: 120 mg/dL — ABNORMAL HIGH (ref 65–99)
Glucose-Capillary: 153 mg/dL — ABNORMAL HIGH (ref 65–99)
Glucose-Capillary: 98 mg/dL (ref 65–99)

## 2015-08-22 LAB — CBC
HCT: 37.4 % — ABNORMAL LOW (ref 39.0–52.0)
HCT: 37.1 % — ABNORMAL LOW (ref 39.0–52.0)
Hemoglobin: 12.7 g/dL — ABNORMAL LOW (ref 13.0–17.0)
Hemoglobin: 12.6 g/dL — ABNORMAL LOW (ref 13.0–17.0)
MCH: 30.7 pg (ref 26.0–34.0)
MCH: 31.1 pg (ref 26.0–34.0)
MCHC: 34 g/dL (ref 30.0–36.0)
MCHC: 34 g/dL (ref 30.0–36.0)
MCV: 90.3 fL (ref 78.0–100.0)
MCV: 91.6 fL (ref 78.0–100.0)
PLATELETS: 183 10*3/uL (ref 150–400)
PLATELETS: 193 10*3/uL (ref 150–400)
RBC: 4.14 MIL/uL — AB (ref 4.22–5.81)
RBC: 4.05 MIL/uL — ABNORMAL LOW (ref 4.22–5.81)
RDW: 13.2 % (ref 11.5–15.5)
RDW: 13.5 % (ref 11.5–15.5)
WBC: 10.8 10*3/uL — ABNORMAL HIGH (ref 4.0–10.5)
WBC: 11.2 10*3/uL — AB (ref 4.0–10.5)

## 2015-08-22 LAB — BASIC METABOLIC PANEL
Anion gap: 12 (ref 5–15)
BUN: 13 mg/dL (ref 6–20)
CO2: 21 mmol/L — AB (ref 22–32)
Calcium: 8.6 mg/dL — ABNORMAL LOW (ref 8.9–10.3)
Chloride: 111 mmol/L (ref 101–111)
Creatinine, Ser: 1.49 mg/dL — ABNORMAL HIGH (ref 0.61–1.24)
GFR calc Af Amer: 55 mL/min — ABNORMAL LOW (ref >60)
GFR, EST NON AFRICAN AMERICAN: 48 mL/min — AB (ref >60)
GLUCOSE: 151 mg/dL — AB (ref 65–99)
POTASSIUM: 4.3 mmol/L (ref 3.5–5.1)
Sodium: 144 mmol/L (ref 135–145)

## 2015-08-22 LAB — POCT I-STAT 3, ART BLOOD GAS (G3+)
Acid-base deficit: 6 mmol/L — ABNORMAL HIGH (ref 0.0–2.0)
BICARBONATE: 20 meq/L (ref 20.0–24.0)
O2 SAT: 94 %
PCO2 ART: 37.7 mmHg (ref 35.0–45.0)
Patient temperature: 36.1
TCO2: 21 mmol/L (ref 0–100)
pH, Arterial: 7.329 — ABNORMAL LOW (ref 7.350–7.450)
pO2, Arterial: 72 mmHg — ABNORMAL LOW (ref 80.0–100.0)

## 2015-08-22 LAB — MAGNESIUM
MAGNESIUM: 2.6 mg/dL — AB (ref 1.7–2.4)
Magnesium: 2.7 mg/dL — ABNORMAL HIGH (ref 1.7–2.4)

## 2015-08-22 LAB — TROPONIN I: Troponin I: 0.53 ng/mL (ref <0.031)

## 2015-08-22 MED ORDER — INSULIN ASPART 100 UNIT/ML ~~LOC~~ SOLN
0.0000 [IU] | SUBCUTANEOUS | Status: DC
  Administered 2015-08-23: 2 [IU] via SUBCUTANEOUS

## 2015-08-22 MED ORDER — ALUM & MAG HYDROXIDE-SIMETH 200-200-20 MG/5ML PO SUSP: 15.0000 mL | ORAL | Status: DC | PRN

## 2015-08-22 MED ORDER — METOCLOPRAMIDE HCL 5 MG/ML IJ SOLN
10.0000 mg | Freq: Three times a day (TID) | INTRAMUSCULAR | Status: AC
  Administered 2015-08-22 – 2015-08-23 (×3): 10 mg via INTRAVENOUS
  Filled 2015-08-22 (×2): qty 2

## 2015-08-22 MED ORDER — INSULIN DETEMIR 100 UNIT/ML ~~LOC~~ SOLN
15.0000 [IU] | Freq: Every day | SUBCUTANEOUS | Status: DC
  Administered 2015-08-22: 15 [IU] via SUBCUTANEOUS
  Filled 2015-08-22 (×2): qty 0.15

## 2015-08-22 MED ORDER — ENOXAPARIN SODIUM 30 MG/0.3ML ~~LOC~~ SOLN
30.0000 mg | Freq: Every day | SUBCUTANEOUS | Status: DC
  Administered 2015-08-22 – 2015-08-24 (×3): 30 mg via SUBCUTANEOUS
  Filled 2015-08-22 (×3): qty 0.3

## 2015-08-22 NOTE — Significant Event (Signed)
CBG this evening elevated at 195, spoke with Dr. Servando Snare. MD gave verbal order for Levemir 15units Daily, starting today. Will put in order. Bradford Cazier, Therapist, sports.

## 2015-08-22 NOTE — Progress Notes (Signed)
SUBJECTIVE:  He would like something for gas.  Some CP with coughing.  No CP like he had with walking prior to the CABG/PCI.  OBJECTIVE:   Vitals:   Filed Vitals:   08/22/15 0626 08/22/15 0700 08/22/15 0715 08/22/15 0730  BP:  98/64    Pulse: 64 70 63 62  Temp: 98.8 F (37.1 C) 98.8 F (37.1 C) 98.8 F (37.1 C) 98.6 F (37 C)  TempSrc:  Core (Comment)    Resp: 19 22 25 18   Height:      Weight:      SpO2: 96% 97% 98% 97%   I&O's:   Intake/Output Summary (Last 24 hours) at 08/22/15 0901 Last data filed at 08/22/15 0700  Gross per 24 hour  Intake   4490 ml  Output   3205 ml  Net   1285 ml   TELEMETRY: Reviewed telemetry pt in NSR:     PHYSICAL EXAM General: Well developed, well nourished, in no acute distress Head:   Normal cephalic and atramatic  Lungs:  Coarse breath sounds bilaterally to auscultation. Heart:   HRRR S1 S2  No JVD.   Abdomen: abdomen soft and non-tender Msk:  Back normal,  Normal strength and tone for age. Extremities:   No edema.  Mild right groin bruising; 2+ right PT pulse Neuro: Alert and oriented. Psych:  Normal affect, responds appropriately Skin: No rash   LABS: Basic Metabolic Panel:  Recent Labs  08/20/15 0523  08/21/15 1632 08/21/15 1742 08/22/15 0225  NA 141  < > 144 144 144  K 3.9  < > 3.7 3.5 4.3  CL 108  < > 110  --  111  CO2 23  --   --   --  21*  GLUCOSE 131*  < > 128* 102* 151*  BUN 16  < > 15  --  13  CREATININE 1.54*  < > 1.20  --  1.49*  CALCIUM 9.2  --   --   --  8.6*  MG  --   --   --   --  2.7*  < > = values in this interval not displayed. Liver Function Tests: No results for input(s): AST, ALT, ALKPHOS, BILITOT, PROT, ALBUMIN in the last 72 hours. No results for input(s): LIPASE, AMYLASE in the last 72 hours. CBC:  Recent Labs  08/21/15 1730 08/21/15 1742 08/22/15 0225  WBC 11.1*  --  10.8*  HGB 13.1 12.6* 12.7*  HCT 38.3* 37.0* 37.4*  MCV 89.7  --  90.3  PLT 191  --  183   Cardiac Enzymes: No  results for input(s): CKTOTAL, CKMB, CKMBINDEX, TROPONINI in the last 72 hours. BNP: Invalid input(s): POCBNP D-Dimer: No results for input(s): DDIMER in the last 72 hours. Hemoglobin A1C: No results for input(s): HGBA1C in the last 72 hours. Fasting Lipid Panel: No results for input(s): CHOL, HDL, LDLCALC, TRIG, CHOLHDL, LDLDIRECT in the last 72 hours. Thyroid Function Tests: No results for input(s): TSH, T4TOTAL, T3FREE, THYROIDAB in the last 72 hours.  Invalid input(s): FREET3 Anemia Panel: No results for input(s): VITAMINB12, FOLATE, FERRITIN, TIBC, IRON, RETICCTPCT in the last 72 hours. Coag Panel:   Lab Results  Component Value Date   INR 1.27 08/21/2015   INR 1.07 08/17/2015   INR 0.95 08/16/2015    RADIOLOGY: Dg Chest 2 View  08/19/2015  CLINICAL DATA:  Preop cardiovascular exam EXAM: CHEST  2 VIEW COMPARISON:  01/07/2015 FINDINGS: Cardiomediastinal silhouette is stable. No segmental infiltrate  or pulmonary edema. Streaky bilateral linear atelectasis or scarring. No pleural effusion. Mild tenting of the right hemidiaphragm again noted. Bony thorax is stable. IMPRESSION: No segmental infiltrate or pulmonary edema. Streaky somewhat linear and bilateral basilar atelectasis or scarring. Electronically Signed   By: Lahoma Crocker M.D.   On: 08/19/2015 11:42   Dg Chest Port 1 View  08/22/2015  CLINICAL DATA:  Status post CABG. EXAM: PORTABLE CHEST 1 VIEW COMPARISON:  08/21/2015. FINDINGS: The endotracheal tube has been removed. The right jugular Swan-Ganz catheter tip in the proximal intersegmental pulmonary artery. The nasogastric tube has been removed. A left chest tube remains in place with no pneumothorax seen. Mildly increased bibasilar atelectasis. Borderline enlarged cardiac silhouette. IMPRESSION: Mildly increased bibasilar atelectasis. Electronically Signed   By: Claudie Revering M.D.   On: 08/22/2015 07:27   Dg Chest Port 1 View  08/21/2015  CLINICAL DATA:  Status post CABG. EXAM:  PORTABLE CHEST 1 VIEW COMPARISON:  08/19/2015 FINDINGS: There has been a prior intubation. Endotracheal tube is 18 mm superior to the carina. Enteric catheter is seen with tip collimated off the image. Swan-Ganz right IJ approach catheter with tip in the right main pulmonary artery. Left chest tube is in place. Postsurgical changes from recent CABG. Cardiomediastinal silhouette is normal. Mediastinal contours appear intact. There is no evidence of focal airspace consolidation, pneumothorax. There are low lung volumes with small left pleural effusion and left basilar atelectasis. Osseous structures are without acute abnormality. Soft tissues are grossly normal. IMPRESSION: Endotracheal tube 18 mm superior to the carina. Swan-Ganz catheter tip within right main pulmonary artery. Low lung volumes with small left pleural effusion and left basilar atelectasis. Electronically Signed   By: Fidela Salisbury M.D.   On: 08/21/2015 18:02      ASSESSMENT: CAD, angina, s/p hybrid CABG/PCI procedure.    PLAN:  Plavix 75 mg daily today.  Groin stable.  Needs aggressive secondary prevention with DAPT and statin.  ECG shows lateral ST changes although his circumflex was free of disease.  No anginal chest pain at this time.   CRI: stage 3. Stable today.   Maalox prn for gas.  Jettie Booze, MD  08/22/2015  9:01 AM

## 2015-08-22 NOTE — Progress Notes (Signed)
Patient ID: Jeffrey Holmes, male   DOB: 08/10/1950, 65 y.o.   MRN: [id number] EVENING ROUNDS NOTE :     [address]       [place], [address]             [phone]                 1 Day Post-Op Procedure(s) (LRB): TRANSESOPHAGEAL ECHOCARDIOGRAM (TEE) (N/A) OFF PUMP CORONARY ARTERY BYPASS GRAFTING (CABG), TIMES TWO, USING LEFT INTERNAL MAMMARY ARTERY, RIGHT GREATER SAPHENOUS VEIN HARVESTED ENDOSCOPICALLY (N/A) ANGIOPLASTY WITH PERCUTANEOUS CORONARY INTERVENTION WITH DES TO RIGHT CORONARY ARTERY (N/A)  Total Length of Stay:  LOS: 5 days  BP 136/76 mmHg  Pulse 68  Temp(Src) 98.8 F (37.1 C) (Oral)  Resp 26  Ht 5\' 5"  (1.651 m)  Wt 188 lb 15 oz (85.7 kg)  BMI 31.44 kg/m2  SpO2 93%  .Intake/Output      01/24 0701 - 01/25 0700   P.O. 240   I.V. (mL/kg) 90 (1.1)   NG/GT    IV Piggyback 50   Total Intake(mL/kg) 380 (4.4)   Urine (mL/kg/hr) 520 (0.5)   Emesis/NG output    Blood    Chest Tube 80 (0.1)   Total Output 600   Net -220         . sodium chloride Stopped (08/22/15 1000)  . sodium chloride    . sodium chloride Stopped (08/21/15 2000)  . dexmedetomidine Stopped (08/21/15 2105)  . lactated ringers    . lactated ringers Stopped (08/22/15 0900)  . nitroGLYCERIN Stopped (08/21/15 2000)  . phenylephrine (NEO-SYNEPHRINE) Adult infusion Stopped (08/21/15 1846)     Lab Results  Component Value Date   WBC 11.2* 08/22/2015   HGB 12.6* 08/22/2015   HCT 37.1* 08/22/2015   PLT 193 08/22/2015   GLUCOSE 151* 08/22/2015   CHOL 212* 08/19/2015   TRIG 224* 08/19/2015   HDL 35* 08/19/2015   LDLCALC 132* 08/19/2015   ALT 46 08/19/2015   AST 22 08/19/2015   NA 144 08/22/2015   K 4.3 08/22/2015   CL 111 08/22/2015   CREATININE 1.70* 08/22/2015   BUN 13 08/22/2015   CO2 21* 08/22/2015   TSH 3.479 08/17/2015   INR 1.27 08/21/2015   HGBA1C 6.9* 08/19/2015   Stable day  Cr 1.7  Up from 1.49 , watch for AKI but not 0.3 increased in cr at this point uop  ok preop ckd  On statin   [name] MD  Beeper [phone] Office [phone] 08/22/2015 7:17 PM

## 2015-08-22 NOTE — Progress Notes (Signed)
Patient ID: Jeffrey Holmes, male   DOB: Feb 03, 1951, 65 y.o.   MRN: [id number] TCTS DAILY ICU PROGRESS NOTE                   [address]            [place] [address]          [phone]   1 Day Post-Op Procedure(s) (LRB): TRANSESOPHAGEAL ECHOCARDIOGRAM (TEE) (N/A) OFF PUMP CORONARY ARTERY BYPASS GRAFTING (CABG), TIMES TWO, USING LEFT INTERNAL MAMMARY ARTERY, RIGHT GREATER SAPHENOUS VEIN HARVESTED ENDOSCOPICALLY (N/A) ANGIOPLASTY WITH PERCUTANEOUS CORONARY INTERVENTION WITH DES TO RIGHT CORONARY ARTERY (N/A)  Total Length of Stay:  LOS: 5 days   Subjective: Patient awake and alert, neuro intact, some nausea this am  Objective: Vital signs in last 24 hours: Temp:  [93.7 F (34.3 C)-99.3 F (37.4 C)] 98.6 F (37 C) (01/24 0730) Pulse Rate:  [62-80] 62 (01/24 0730) Cardiac Rhythm:  [-] Normal sinus rhythm (01/24 0740) Resp:  [12-25] 18 (01/24 0730) BP: (96-119)/(52-84) 98/64 mmHg (01/24 0700) SpO2:  [95 %-98 %] 97 % (01/24 0730) Arterial Line BP: (90-144)/(49-72) 113/52 mmHg (01/24 0730) FiO2 (%):  [40 %-50 %] 40 % (01/23 2255) Weight:  [188 lb 15 oz (85.7 kg)] 188 lb 15 oz (85.7 kg) (01/24 0500)  Filed Weights   08/20/15 0619 08/21/15 0500 08/22/15 0500  Weight: 183 lb 9.6 oz (83.28 kg) 180 lb 14.4 oz (82.056 kg) 188 lb 15 oz (85.7 kg)    Weight change: 8 lb 0.6 oz (3.644 kg)   Hemodynamic parameters for last 24 hours: PAP: (16-27)/(8-23) 21/12 mmHg CO:  [3.2 L/min-4.8 L/min] 3.9 L/min CI:  [1.7 L/min/m2-2.5 L/min/m2] 2.1 L/min/m2  Intake/Output from previous day: 01/23 0701 - 01/24 0700 In: 4490 [I.V.:3910; NG/GT:30; IV Piggyback:550] Out: 3205 [Urine:2675; Blood:300; Chest Tube:230]  Intake/Output this shift:    Current Meds: Scheduled Meds: . acetaminophen  1,000 mg Oral 4 times per day   Or  . acetaminophen (TYLENOL) oral liquid 160 mg/5 mL  1,000 mg Per Tube 4 times per day  . aspirin EC  325 mg Oral Daily   Or  . aspirin  324 mg Per Tube  Daily  . atorvastatin  40 mg Oral q1800  . bisacodyl  10 mg Oral Daily   Or  . bisacodyl  10 mg Rectal Daily  . cefUROXime (ZINACEF)  IV  1.5 g Intravenous Q12H  . clopidogrel  75 mg Oral Daily  . docusate sodium  200 mg Oral Daily  . enoxaparin (LOVENOX) injection  30 mg Subcutaneous QHS  . insulin aspart  0-24 Units Subcutaneous 6 times per day  . insulin aspart  0-24 Units Subcutaneous 6 times per day  . metoprolol tartrate  12.5 mg Oral BID   Or  . metoprolol tartrate  12.5 mg Per Tube BID  . [START ON 08/23/2015] pantoprazole  40 mg Oral Daily  . sodium chloride  3 mL Intravenous Q12H  . Travoprost (BAK Free)  1 drop Both Eyes QHS   Continuous Infusions: . sodium chloride 10 mL/hr at 08/22/15 0600  . sodium chloride    . sodium chloride Stopped (08/21/15 2000)  . dexmedetomidine Stopped (08/21/15 2105)  . lactated ringers    . lactated ringers 20 mL/hr at 08/22/15 0600  . nitroGLYCERIN Stopped (08/21/15 2000)  . phenylephrine (NEO-SYNEPHRINE) Adult infusion Stopped (08/21/15 1846)   PRN Meds:.sodium chloride, albumin human, lactated ringers, metoprolol, morphine injection, ondansetron (ZOFRAN) IV, sodium  chloride  General appearance: alert and cooperative Neurologic: intact Heart: regular rate and rhythm, S1, S2 normal, no murmur, click, rub or gallop Lungs: diminished breath sounds bibasilar Abdomen: soft, non-tender; bowel sounds normal; no masses,  no organomegaly Extremities: extremities normal, atraumatic, no cyanosis or edema Wound: sternum stable, silver dressing intact  Lab Results: CBC: Recent Labs  08/21/15 1730 08/21/15 1742 08/22/15 0225  WBC 11.1*  --  10.8*  HGB 13.1 12.6* 12.7*  HCT 38.3* 37.0* 37.4*  PLT 191  --  183   BMET:  Recent Labs  08/20/15 0523  08/21/15 1632 08/21/15 1742 08/22/15 0225  NA 141  < > 144 144 144  K 3.9  < > 3.7 3.5 4.3  CL 108  < > 110  --  111  CO2 23  --   --   --  21*  GLUCOSE 131*  < > 128* 102* 151*  BUN 16   < > 15  --  13  CREATININE 1.54*  < > 1.20  --  1.49*  CALCIUM 9.2  --   --   --  8.6*  < > = values in this interval not displayed.  PT/INR:  Recent Labs  08/21/15 1730  LABPROT 16.1*  INR 1.27   Radiology: Dg Chest Port 1 View  08/22/2015  CLINICAL DATA:  Status post CABG. EXAM: PORTABLE CHEST 1 VIEW COMPARISON:  08/21/2015. FINDINGS: The endotracheal tube has been removed. The right jugular Swan-Ganz catheter tip in the proximal intersegmental pulmonary artery. The nasogastric tube has been removed. A left chest tube remains in place with no pneumothorax seen. Mildly increased bibasilar atelectasis. Borderline enlarged cardiac silhouette. IMPRESSION: Mildly increased bibasilar atelectasis. Electronically Signed   By: Claudie Revering M.D.   On: 08/22/2015 07:27   Dg Chest Port 1 View  08/21/2015  CLINICAL DATA:  Status post CABG. EXAM: PORTABLE CHEST 1 VIEW COMPARISON:  08/19/2015 FINDINGS: There has been a prior intubation. Endotracheal tube is 18 mm superior to the carina. Enteric catheter is seen with tip collimated off the image. Swan-Ganz right IJ approach catheter with tip in the right main pulmonary artery. Left chest tube is in place. Postsurgical changes from recent CABG. Cardiomediastinal silhouette is normal. Mediastinal contours appear intact. There is no evidence of focal airspace consolidation, pneumothorax. There are low lung volumes with small left pleural effusion and left basilar atelectasis. Osseous structures are without acute abnormality. Soft tissues are grossly normal. IMPRESSION: Endotracheal tube 18 mm superior to the carina. Swan-Ganz catheter tip within right main pulmonary artery. Low lung volumes with small left pleural effusion and left basilar atelectasis. Electronically Signed   By: Fidela Salisbury M.D.   On: 08/21/2015 18:02   Chronic Kidney Disease   Stage I     GFR >90  Stage II    GFR 60-89  Stage IIIA GFR 45-59  Stage IIIB GFR 30-44  Stage IV   GFR  15-29  Stage V    GFR  <15  Lab Results  Component Value Date   CREATININE 1.49* 08/22/2015   Estimated Creatinine Clearance: 49.8 mL/min (by C-G formula based on Cr of 1.49).   Assessment/Plan: S/P Procedure(s) (LRB): TRANSESOPHAGEAL ECHOCARDIOGRAM (TEE) (N/A) OFF PUMP CORONARY ARTERY BYPASS GRAFTING (CABG), TIMES TWO, USING LEFT INTERNAL MAMMARY ARTERY, RIGHT GREATER SAPHENOUS VEIN HARVESTED ENDOSCOPICALLY (N/A) ANGIOPLASTY WITH PERCUTANEOUS CORONARY INTERVENTION WITH DES TO RIGHT CORONARY ARTERY (N/A) Mobilize Diuresis d/c tubes/lines See progression orders Renal function at prop baseline, monitor uop Follow up  troponin pending this am Expected Acute  Blood - loss Anemia - mild  [name] 08/22/2015 8:22 AM

## 2015-08-22 NOTE — Plan of Care (Signed)
Problem: Pain Management: Goal: Pain level will decrease Outcome: Progressing IV morphine to treat pain

## 2015-08-22 NOTE — Progress Notes (Signed)
  EKG CRITICAL VALUE     12 lead EKG performed.  Critical value noted.  Jaynie Bream, RN notified.   Adalena Abdulla L, CCT 08/22/2015 7:39 AM

## 2015-08-22 NOTE — Op Note (Signed)
NAMECONSTANT, FREYRE.:  [id number]  MEDICAL RECORD NO.:  [id number]  LOCATION:  [facility]                       FACILITY:  [facility]  PHYSICIAN:  Lanelle Bal, MD    DATE OF BIRTH:  [birth year]  DATE OF PROCEDURE:  08/21/2015 DATE OF DISCHARGE:  08/21/2015                              OPERATIVE REPORT   PREOPERATIVE DIAGNOSIS:  Coronary occlusive disease with unstable angina.  POSTOPERATIVE DIAGNOSIS:  Coronary occlusive disease with unstable angina.  SURGICAL PROCEDURE:  Coronary artery bypass grafting, off pump, with the left internal mammary to the left anterior descending coronary artery and reverse right greater saphenous vein and then has a hybrid procedure with Cardiology, and concomitant coronary angiogram and placement of proximal right coronary artery stent.  SURGEON:  Lanelle Bal, M.D.  FIRST ASSISTANT:  John Giovanni, PA-C  BRIEF HISTORY:  The patient is a 65 year old male, who presents with new onset of angina.  He was admitted because of increasing frequency of chest pain.  He underwent cardiac catheterization by Dr. Irish Lack, which demonstrated a subtotal LAD diagonal and 60%-70% proximal right stenosis with overall preserved LV function.  The circumflex was without significant disease.  In collaboration with Cardiology, it was decided to proceed with coronary artery bypass grafting to the LAD diagonal and stent the proximal right.  Risks and options were discussed with the patient in detail.  He was agreeable and signed informed consent.  DESCRIPTION OF PROCEDURE:  With Swan-Ganz and arterial line monitors in place.  The patient was brought to the hybrid OR, placed in a supine position, underwent general endotracheal anesthesia without incident. With the operative procedure planned, the patient initially had a NG- tube placed.  This was with some difficulty and ultimately was positioned and confirmed its location both by fluoroscopy  and also instilling air.  600 mg of Plavix was crushed and placed in the NG tube. The NG tube was then removed by Anesthesia and a TEE probe was placed by Dr. Glennon Mac.  We then proceeded with prepping the chest, abdomen, and legs and draped in usual sterile manner.  With ultrasound guidance, the right femoral artery was cannulated with a guidewire and over this, a single lumen central line was placed as an arterial line and for further access later in the case for arteriography.  We then proceeded with a sternotomy, left internal mammary artery was dissected down as pedicle graft.  The distal artery was divided, had good free flow, was hydrostatically dilated with heparinized saline.  Concomitantly  short segment of vein was harvested opened from the right lower extremity and was of excellent quality and caliber.  The pericardium was opened.  The LAD and diagonal were carefully inspected and it was felt suitable for off pump bypass and Guidant stabilization system was positioned.  The patient was systemically heparinized, achieving the ACT of over 300.  After systemic heparinization, a partial occlusion clamp was placed on the ascending aorta and a single punch aortotomy was performed.  The vein graft to the diagonal was then anastomosed to the ascending aorta. Air was flushed from the [place]rafton.  Bulldog was placed on the graft for  anastomosed to the diagonal coronary artery. Suction cup was placed on the apex of the heart.  The heart gently elevated, given exposure to the first the diagonal artery. The stabilization plate was also positioned.  Two elastic bands were placed circumferentially around the diagonal coronary artery.  We then opened the diagonal, admitted a 1 mm probe, passed distally.  Using a running 7-0 Prolene segment reverse saphenous vein, graft was anastomosed to the diagonal coronary artery.   With the diagonal bypass completed, we then moved to the left  internal mammary, which was positioned.  The stabilization plate was moved to the LAD and mid third of the LAD, vessel loops were placed around the LAD. The vessel was opened and with a running 8-0 Prolene left internal mammary artery was anastomosed to the left anterior descending coronary artery.  The fascia was tacked to the epicardium.  Stabilization plate and suction cup were removed during the off pump procedure.  The patient's EKG remained unchanged and remained hemodynamically stable. At this point in the case, Dr. Irish Lack came and under a separate note dictated and performed angiography and placement of stent graft.  The patency of the mammary artery was confirmed.  The distal LAD was relatively small.  Intracoronary nitroglycerin was administered and we could confirm a distal flow down the LAD.  Arteriography of the vein graft showed a defect.  Approximately, the distal diagonal was a very small vessel with slow runoff.  The proximal defect after we replaced the retractor.  After completion to close the chest was apparent that the proximally there was a kink in the vein graft to the diagonal, which was corrected, as we closed the chest.  Atrial and ventricular pacing wires were applied.  Protamine sulfate was administered.  The pericardium was loosely reapproximated.  Sternum was closed and I had left pleural tube and a Blake mediastinal drain were left in place. Sternum was closed with #6 stainless steel wire.  Fascia was closed with interrupted Vicryl running 3-0 Vicryl and subcutaneous tissue. Subcuticular stitch in skin edges.  Dry dressings were applied.  Sponge and needle count was reported as correct at the completion of the procedure.  The patient was transferred to the Surgical Intensive care Unit for further postoperative care.  He did not require any blood products during the procedure.     Lanelle Bal, MD     EG/MEDQ  D:  08/22/2015  T:  08/22/2015   Job:  [id number]

## 2015-08-23 ENCOUNTER — Inpatient Hospital Stay (HOSPITAL_COMMUNITY): Payer: Medicare Other

## 2015-08-23 LAB — CBC
HCT: 37.4 % — ABNORMAL LOW (ref 39.0–52.0)
Hemoglobin: 12.6 g/dL — ABNORMAL LOW (ref 13.0–17.0)
MCH: 30.9 pg (ref 26.0–34.0)
MCHC: 33.7 g/dL (ref 30.0–36.0)
MCV: 91.7 fL (ref 78.0–100.0)
Platelets: 185 10*3/uL (ref 150–400)
RBC: 4.08 MIL/uL — ABNORMAL LOW (ref 4.22–5.81)
RDW: 13.5 % (ref 11.5–15.5)
WBC: 11.5 10*3/uL — ABNORMAL HIGH (ref 4.0–10.5)

## 2015-08-23 LAB — BASIC METABOLIC PANEL
Anion gap: 9 (ref 5–15)
BUN: 11 mg/dL (ref 6–20)
CO2: 22 mmol/L (ref 22–32)
Calcium: 8.1 mg/dL — ABNORMAL LOW (ref 8.9–10.3)
Chloride: 104 mmol/L (ref 101–111)
Creatinine, Ser: 1.56 mg/dL — ABNORMAL HIGH (ref 0.61–1.24)
GFR calc Af Amer: 52 mL/min — ABNORMAL LOW (ref >60)
GFR calc non Af Amer: 45 mL/min — ABNORMAL LOW (ref >60)
Glucose, Bld: 118 mg/dL — ABNORMAL HIGH (ref 65–99)
Potassium: 3.6 mmol/L (ref 3.5–5.1)
Sodium: 135 mmol/L (ref 135–145)

## 2015-08-23 LAB — GLUCOSE, CAPILLARY
GLUCOSE-CAPILLARY: 115 mg/dL — AB (ref 65–99)
GLUCOSE-CAPILLARY: 129 mg/dL — AB (ref 65–99)
GLUCOSE-CAPILLARY: 131 mg/dL — AB (ref 65–99)
Glucose-Capillary: 113 mg/dL — ABNORMAL HIGH (ref 65–99)
Glucose-Capillary: 110 mg/dL — ABNORMAL HIGH (ref 65–99)

## 2015-08-23 MED ORDER — SODIUM CHLORIDE 0.9% FLUSH
3.0000 mL | INTRAVENOUS | Status: DC | PRN
  Filled 2015-08-23: qty 3

## 2015-08-23 MED ORDER — INSULIN ASPART 100 UNIT/ML ~~LOC~~ SOLN
0.0000 [IU] | Freq: Three times a day (TID) | SUBCUTANEOUS | Status: DC
  Administered 2015-08-24: 2 [IU] via SUBCUTANEOUS

## 2015-08-23 MED ORDER — SODIUM CHLORIDE 0.9 % IV SOLN: 250.0000 mL | INTRAVENOUS | Status: DC | PRN

## 2015-08-23 MED ORDER — DOCUSATE SODIUM 100 MG PO CAPS: 200.0000 mg | ORAL_CAPSULE | Freq: Every day | ORAL | Status: DC

## 2015-08-23 MED ORDER — BISACODYL 5 MG PO TBEC: 10.0000 mg | DELAYED_RELEASE_TABLET | Freq: Every day | ORAL | Status: DC | PRN

## 2015-08-23 MED ORDER — METOPROLOL TARTRATE 12.5 MG HALF TABLET
12.5000 mg | ORAL_TABLET | Freq: Two times a day (BID) | ORAL | Status: DC
  Administered 2015-08-23 – 2015-08-24 (×4): 12.5 mg via ORAL
  Filled 2015-08-23 (×4): qty 1

## 2015-08-23 MED ORDER — SODIUM CHLORIDE 0.9% FLUSH
3.0000 mL | Freq: Two times a day (BID) | INTRAVENOUS | Status: DC
  Administered 2015-08-23 – 2015-08-25 (×4): 3 mL via INTRAVENOUS
  Filled 2015-08-23 (×2): qty 3

## 2015-08-23 MED ORDER — PANTOPRAZOLE SODIUM 40 MG PO TBEC: 40.0000 mg | DELAYED_RELEASE_TABLET | Freq: Every day | ORAL | Status: DC

## 2015-08-23 MED ORDER — ONDANSETRON HCL 4 MG PO TABS: 4.0000 mg | ORAL_TABLET | Freq: Four times a day (QID) | ORAL | Status: DC | PRN

## 2015-08-23 MED ORDER — TRAMADOL HCL 50 MG PO TABS: 50.0000 mg | ORAL_TABLET | ORAL | Status: DC | PRN

## 2015-08-23 MED ORDER — ONDANSETRON HCL 4 MG/2ML IJ SOLN: 4.0000 mg | Freq: Four times a day (QID) | INTRAMUSCULAR | Status: DC | PRN

## 2015-08-23 MED ORDER — BISACODYL 10 MG RE SUPP: 10.0000 mg | Freq: Every day | RECTAL | Status: DC | PRN

## 2015-08-23 MED ORDER — ASPIRIN EC 81 MG PO TBEC: 81.0000 mg | DELAYED_RELEASE_TABLET | Freq: Every day | ORAL | Status: DC

## 2015-08-23 MED ORDER — POTASSIUM CHLORIDE 10 MEQ/50ML IV SOLN
10.0000 meq | INTRAVENOUS | Status: AC
  Administered 2015-08-23 (×3): 10 meq via INTRAVENOUS

## 2015-08-23 MED ORDER — MOVING RIGHT ALONG BOOK
Freq: Once | Status: AC
  Administered 2015-08-23: 10:00:00
  Filled 2015-08-23: qty 1

## 2015-08-23 MED ORDER — TIMOLOL MALEATE 0.5 % OP SOLN
1.0000 [drp] | Freq: Two times a day (BID) | OPHTHALMIC | Status: DC
  Administered 2015-08-23 – 2015-08-25 (×3): 1 [drp] via OPHTHALMIC
  Filled 2015-08-23 (×3): qty 5

## 2015-08-23 NOTE — Significant Event (Signed)
Patient ambulated to 2W20, VS stable prior and during the ambulation. Patient's spouse at bedside. All belongings taken by patient's sposue (cellphone, Advertising copywriter, and eye-glasses). Patient settled in chair. Receiving RN Lattie Haw in room. Report given. Isaih Bulger, Therapist, sports.

## 2015-08-23 NOTE — Progress Notes (Signed)
CARDIAC REHAB PHASE I   PRE:  Rate/Rhythm: 83 SR  BP:  Supine:   Sitting: 158/90  Standing:    SaO2: 95%RA  MODE:  Ambulation: 300 ft   POST:  Rate/Rhythm: 87 SR  BP:  Supine: 149/84  Sitting:   Standing:    SaO2: 93%RA 1336-1400 Pt walked 300 ft on RA with rolling walker and minimal asst. Gait steady and tolerated well. To bed after walk.   Graylon Good, RN BSN  08/23/2015 1:57 PM

## 2015-08-23 NOTE — Care Management Note (Signed)
Case Management Note  Patient Details  Name: Jeffrey Holmes MRN: [id number] Date of Birth: Oct 26, 1950  Subjective/Objective:    Lives at home with wife.  Wife states will be with him 24/7 on discharge. Independent prior to admission.  Looks good at this time.                 Action/Plan:   Expected Discharge Date:                  Expected Discharge Plan:  Home/Self Care  In-House Referral:  NA  Discharge planning Services  CM Consult  Post Acute Care Choice:    Choice offered to:     DME Arranged:    DME Agency:     HH Arranged:    HH Agency:     Status of Service:  In process, will continue to follow  Medicare Important Message Given:    Date Medicare IM Given:    Medicare IM give by:    Date Additional Medicare IM Given:    Additional Medicare Important Message give by:     If discussed at [place] of Stay Meetings, dates discussed:    Additional Comments:  Vergie Living, RN 08/23/2015, 12:07 PM

## 2015-08-23 NOTE — Progress Notes (Signed)
Patient ID: Jeffrey Holmes, male   DOB: Apr 05, 1951, 65 y.o.   MRN: [id number] TCTS DAILY ICU PROGRESS NOTE                   [address]            [place] [address]          [phone]   2 Days Post-Op Procedure(s) (LRB): TRANSESOPHAGEAL ECHOCARDIOGRAM (TEE) (N/A) OFF PUMP CORONARY ARTERY BYPASS GRAFTING (CABG), TIMES TWO, USING LEFT INTERNAL MAMMARY ARTERY, RIGHT GREATER SAPHENOUS VEIN HARVESTED ENDOSCOPICALLY (N/A) ANGIOPLASTY WITH PERCUTANEOUS CORONARY INTERVENTION WITH DES TO RIGHT CORONARY ARTERY (N/A)  Total Length of Stay:  LOS: 6 days   Subjective: Alert, nausea much improved,   Objective: Vital signs in last 24 hours: Temp:  [98.3 F (36.8 C)-99.9 F (37.7 C)] 99.2 F (37.3 C) (01/25 0723) Pulse Rate:  [59-79] 75 (01/25 0700) Cardiac Rhythm:  [-] Normal sinus rhythm (01/24 2000) Resp:  [15-33] 24 (01/25 0700) BP: (111-147)/(62-81) 141/72 mmHg (01/25 0700) SpO2:  [91 %-98 %] 91 % (01/25 0700) Arterial Line BP: (120-149)/(50-63) 137/59 mmHg (01/24 1100) Weight:  [186 lb 11.7 oz (84.7 kg)] 186 lb 11.7 oz (84.7 kg) (01/25 0700)  Filed Weights   08/21/15 0500 08/22/15 0500 08/23/15 0700  Weight: 180 lb 14.4 oz (82.056 kg) 188 lb 15 oz (85.7 kg) 186 lb 11.7 oz (84.7 kg)    Weight change: -2 lb 3.3 oz (-1 kg)   Hemodynamic parameters for last 24 hours: PAP: (22-25)/(13-15) 22/13 mmHg  Intake/Output from previous day: 01/24 0701 - 01/25 0700 In: 550 [P.O.:240; I.V.:210; IV Piggyback:100] Out: [id number] [Urine:1405; Chest Tube:80]  Intake/Output this shift:    Current Meds: Scheduled Meds: . acetaminophen  1,000 mg Oral 4 times per day   Or  . acetaminophen (TYLENOL) oral liquid 160 mg/5 mL  1,000 mg Per Tube 4 times per day  . aspirin EC  325 mg Oral Daily   Or  . aspirin  324 mg Per Tube Daily  . atorvastatin  40 mg Oral q1800  . bisacodyl  10 mg Oral Daily   Or  . bisacodyl  10 mg Rectal Daily  . cefUROXime (ZINACEF)  IV  1.5 g Intravenous  Q12H  . clopidogrel  75 mg Oral Daily  . docusate sodium  200 mg Oral Daily  . enoxaparin (LOVENOX) injection  30 mg Subcutaneous QHS  . insulin aspart  0-24 Units Subcutaneous 6 times per day  . insulin aspart  0-24 Units Subcutaneous 6 times per day  . insulin detemir  15 Units Subcutaneous Daily  . metoCLOPramide (REGLAN) injection  10 mg Intravenous 3 times per day  . metoprolol tartrate  12.5 mg Oral BID   Or  . metoprolol tartrate  12.5 mg Per Tube BID  . pantoprazole  40 mg Oral Daily  . sodium chloride  3 mL Intravenous Q12H  . Travoprost (BAK Free)  1 drop Both Eyes QHS   Continuous Infusions: . sodium chloride 10 mL/hr at 08/23/15 0700  . sodium chloride    . sodium chloride Stopped (08/21/15 2000)  . dexmedetomidine Stopped (08/21/15 2105)  . lactated ringers    . lactated ringers Stopped (08/22/15 0900)  . nitroGLYCERIN Stopped (08/21/15 2000)  . phenylephrine (NEO-SYNEPHRINE) Adult infusion Stopped (08/21/15 1846)   PRN Meds:.sodium chloride, alum & mag hydroxide-simeth, lactated ringers, metoprolol, morphine injection, ondansetron (ZOFRAN) IV, sodium chloride  General appearance: alert, cooperative and no distress Neurologic: intact  Heart: regular rate and rhythm, S1, S2 normal, no murmur, click, rub or gallop Lungs: clear to auscultation bilaterally Abdomen: soft, non-tender; bowel sounds normal; no masses,  no organomegaly Extremities: extremities normal, atraumatic, no cyanosis or edema and Homans sign is negative, no sign of DVT Wound: sternum stable silver dressing in place  Lab Results: CBC: Recent Labs  08/22/15 1700 08/22/15 1702 08/23/15 0334  WBC 11.2*  --  11.5*  HGB 12.6* 12.9* 12.6*  HCT 37.1* 38.0* 37.4*  PLT 193  --  185   BMET:  Recent Labs  08/22/15 0225  08/22/15 1702 08/23/15 0334  NA 144  --  143 135  K 4.3  --  3.8 3.6  CL 111  --  106 104  CO2 21*  --   --  22  GLUCOSE 151*  --  152* 118*  BUN 13  --  14 11  CREATININE  1.49*  < > 1.50* 1.56*  CALCIUM 8.6*  --   --  8.1*  < > = values in this interval not displayed.  PT/INR:  Recent Labs  08/21/15 1730  LABPROT 16.1*  INR 1.27   Radiology: Dg Chest Port 1 View  08/23/2015  CLINICAL DATA:  CABG. EXAM: PORTABLE CHEST 1 VIEW COMPARISON:  08/22/2015. FINDINGS: Interim removal Swan-Ganz catheter and left chest tube. Right IJ sheath in stable position. Prior CABG. Stable mild cardiomegaly. Low lung volumes with persistent bibasilar atelectasis. Left lower lobe infiltrate cannot be excluded. Small left pleural effusion. No pneumothorax. IMPRESSION: 1. Interim removal Swan-Ganz catheter and left chest tube. Right IJ sheath in stable position. 2. Prior CABG with stable cardiomegaly. No pulmonary venous congestion. 3. Persistent low lung volumes with bibasilar atelectasis. Mild infiltrate left lung base cannot be excluded. Small left pleural effusion cannot be excluded. Electronically Signed   By: Marcello Moores  Register   On: 08/23/2015 07:23     Assessment/Plan: S/P Procedure(s) (LRB): TRANSESOPHAGEAL ECHOCARDIOGRAM (TEE) (N/A) OFF PUMP CORONARY ARTERY BYPASS GRAFTING (CABG), TIMES TWO, USING LEFT INTERNAL MAMMARY ARTERY, RIGHT GREATER SAPHENOUS VEIN HARVESTED ENDOSCOPICALLY (N/A) ANGIOPLASTY WITH PERCUTANEOUS CORONARY INTERVENTION WITH DES TO RIGHT CORONARY ARTERY (N/A) Mobilize Diuresis Diabetes control Plan for transfer to step-down: see transfer orders Renal function remains at base line Hypokalemia - replace k    [name] 08/23/2015 8:14 AM

## 2015-08-23 NOTE — Progress Notes (Signed)
EKG CRITICAL VALUE     12 lead EKG performed.  Critical value noted.  Hyou, Ksoi, RN notified.   Sashay Felling C, CCT 08/23/2015 8:02 AM

## 2015-08-24 ENCOUNTER — Inpatient Hospital Stay (HOSPITAL_COMMUNITY): Payer: Medicare Other

## 2015-08-24 LAB — BASIC METABOLIC PANEL
Anion gap: 9 (ref 5–15)
BUN: 11 mg/dL (ref 6–20)
CO2: 22 mmol/L (ref 22–32)
Calcium: 8.6 mg/dL — ABNORMAL LOW (ref 8.9–10.3)
Chloride: 110 mmol/L (ref 101–111)
Creatinine, Ser: 1.56 mg/dL — ABNORMAL HIGH (ref 0.61–1.24)
GFR calc Af Amer: 52 mL/min — ABNORMAL LOW (ref >60)
GFR calc non Af Amer: 45 mL/min — ABNORMAL LOW (ref >60)
Glucose, Bld: 115 mg/dL — ABNORMAL HIGH (ref 65–99)
Potassium: 3.8 mmol/L (ref 3.5–5.1)
Sodium: 141 mmol/L (ref 135–145)

## 2015-08-24 LAB — CBC
HCT: 38.7 % — ABNORMAL LOW (ref 39.0–52.0)
Hemoglobin: 13.4 g/dL (ref 13.0–17.0)
MCH: 31.3 pg (ref 26.0–34.0)
MCHC: 34.6 g/dL (ref 30.0–36.0)
MCV: 90.4 fL (ref 78.0–100.0)
Platelets: 211 10*3/uL (ref 150–400)
RBC: 4.28 MIL/uL (ref 4.22–5.81)
RDW: 13.5 % (ref 11.5–15.5)
WBC: 11.1 10*3/uL — ABNORMAL HIGH (ref 4.0–10.5)

## 2015-08-24 LAB — GLUCOSE, CAPILLARY
Glucose-Capillary: 159 mg/dL — ABNORMAL HIGH (ref 65–99)
Glucose-Capillary: 111 mg/dL — ABNORMAL HIGH (ref 65–99)
Glucose-Capillary: 100 mg/dL — ABNORMAL HIGH (ref 65–99)
Glucose-Capillary: 111 mg/dL — ABNORMAL HIGH (ref 65–99)

## 2015-08-24 MED ORDER — AMLODIPINE BESYLATE 10 MG PO TABS
10.0000 mg | ORAL_TABLET | Freq: Every day | ORAL | Status: DC
  Administered 2015-08-24 – 2015-08-25 (×2): 10 mg via ORAL
  Filled 2015-08-24 (×2): qty 1

## 2015-08-24 NOTE — Progress Notes (Addendum)
[address]       [place] [address]             [phone]      3 Days Post-Op Procedure(s) (LRB): TRANSESOPHAGEAL ECHOCARDIOGRAM (TEE) (N/A) OFF PUMP CORONARY ARTERY BYPASS GRAFTING (CABG), TIMES TWO, USING LEFT INTERNAL MAMMARY ARTERY, RIGHT GREATER SAPHENOUS VEIN HARVESTED ENDOSCOPICALLY (N/A) ANGIOPLASTY WITH PERCUTANEOUS CORONARY INTERVENTION WITH DES TO RIGHT CORONARY ARTERY (N/A) Subjective: Feels pretty well  Objective: Vital signs in last 24 hours: Temp:  [98.5 F (36.9 C)-99.8 F (37.7 C)] 98.9 F (37.2 C) (01/26 0508) Pulse Rate:  [71-90] 87 (01/26 0508) Cardiac Rhythm:  [-] Normal sinus rhythm (01/25 1949) Resp:  [18-31] 18 (01/26 0508) BP: (133-148)/(69-88) 133/88 mmHg (01/26 0508) SpO2:  [92 %-95 %] 94 % (01/26 0508) Weight:  [182 lb 1.6 oz (82.6 kg)] 182 lb 1.6 oz (82.6 kg) (01/26 0508)  Hemodynamic parameters for last 24 hours:    Intake/Output from previous day: 01/25 0701 - 01/26 0700 In: 540 [P.O.:360; I.V.:30; IV Piggyback:150] Out: 210 [Urine:210] Intake/Output this shift:    General appearance: alert, cooperative and no distress Heart: regular rate and rhythm Lungs: minor basilar crackles Abdomen: benign Extremities: trace edema Wound: incis healing well LE, chest incis dressing dry/intact  Lab Results:  Recent Labs  08/23/15 0334 08/24/15 0352  WBC 11.5* 11.1*  HGB 12.6* 13.4  HCT 37.4* 38.7*  PLT 185 211   BMET:  Recent Labs  08/23/15 0334 08/24/15 0352  NA 135 141  K 3.6 3.8  CL 104 110  CO2 22 22  GLUCOSE 118* 115*  BUN 11 11  CREATININE 1.56* 1.56*  CALCIUM 8.1* 8.6*    PT/INR:  Recent Labs  08/21/15 1730  LABPROT 16.1*  INR 1.27   ABG    Component Value Date/Time   PHART 7.329* 08/22/2015 0113   HCO3 20.0 08/22/2015 0113   TCO2 22 08/22/2015 1702   ACIDBASEDEF 6.0* 08/22/2015 0113   O2SAT 94.0 08/22/2015 0113   CBG (last 3)   Recent Labs  08/23/15 1717 08/23/15 2352  08/24/15 0610  GLUCAP 129* 115* 159*    Meds Scheduled Meds: . acetaminophen  1,000 mg Oral 4 times per day   Or  . acetaminophen (TYLENOL) oral liquid 160 mg/5 mL  1,000 mg Per Tube 4 times per day  . aspirin EC  325 mg Oral Daily   Or  . aspirin  324 mg Per Tube Daily  . atorvastatin  40 mg Oral q1800  . bisacodyl  10 mg Oral Daily   Or  . bisacodyl  10 mg Rectal Daily  . clopidogrel  75 mg Oral Daily  . docusate sodium  200 mg Oral Daily  . enoxaparin (LOVENOX) injection  30 mg Subcutaneous QHS  . insulin aspart  0-24 Units Subcutaneous TID AC & HS  . metoprolol tartrate  12.5 mg Oral BID  . pantoprazole  40 mg Oral Daily  . sodium chloride  3 mL Intravenous Q12H  . sodium chloride flush  3 mL Intravenous Q12H  . timolol  1 drop Both Eyes BID  . Travoprost (BAK Free)  1 drop Both Eyes QHS   Continuous Infusions: . sodium chloride Stopped (08/23/15 1000)  . sodium chloride    . sodium chloride Stopped (08/21/15 2000)   PRN Meds:.sodium chloride, sodium chloride, alum & mag hydroxide-simeth, bisacodyl **OR** bisacodyl, metoprolol, ondansetron **OR** ondansetron (ZOFRAN) IV, sodium chloride, sodium chloride flush, traMADol  Xrays Dg  Chest 2 View  08/24/2015  CLINICAL DATA:  Postop film. EXAM: CHEST  2 VIEW COMPARISON:  August 23, 2015 FINDINGS: No pneumothorax. Bilateral pleural effusions with underlying atelectasis are stable. The cardiomediastinal silhouette is unchanged. Mild edema has improved. Sternotomy wires are intact. No other acute interval changes. IMPRESSION: No active cardiopulmonary disease. Electronically Signed   By: Dorise Bullion III M.D   On: 08/24/2015 07:28   Dg Chest Port 1 View  08/23/2015  CLINICAL DATA:  CABG. EXAM: PORTABLE CHEST 1 VIEW COMPARISON:  08/22/2015. FINDINGS: Interim removal Swan-Ganz catheter and left chest tube. Right IJ sheath in stable position. Prior CABG. Stable mild cardiomegaly. Low lung volumes with persistent bibasilar  atelectasis. Left lower lobe infiltrate cannot be excluded. Small left pleural effusion. No pneumothorax. IMPRESSION: 1. Interim removal Swan-Ganz catheter and left chest tube. Right IJ sheath in stable position. 2. Prior CABG with stable cardiomegaly. No pulmonary venous congestion. 3. Persistent low lung volumes with bibasilar atelectasis. Mild infiltrate left lung base cannot be excluded. Small left pleural effusion cannot be excluded. Electronically Signed   By: Marcello Moores  Register   On: 08/23/2015 07:23    Assessment/Plan: S/P Procedure(s) (LRB): TRANSESOPHAGEAL ECHOCARDIOGRAM (TEE) (N/A) OFF PUMP CORONARY ARTERY BYPASS GRAFTING (CABG), TIMES TWO, USING LEFT INTERNAL MAMMARY ARTERY, RIGHT GREATER SAPHENOUS VEIN HARVESTED ENDOSCOPICALLY (N/A) ANGIOPLASTY WITH PERCUTANEOUS CORONARY INTERVENTION WITH DES TO RIGHT CORONARY ARTERY (N/A)  1 overall progress is good 2 H/H stable 3 creat stable 4 hypertension- restart home norvasc 5 push rehab and pulm toilet 6 poss home 24-48 h  LOS: 7 days    Jeffrey Holmes,Jeffrey Holmes 08/24/2015  Stable on plavix, ambulating without symptoms Home 1-2 days I have seen and examined Jeffrey Holmes and agree with the above assessment  and plan.  [name] MD Beeper [phone] Office [phone] 08/24/2015 8:59 AM

## 2015-08-24 NOTE — Progress Notes (Signed)
CARDIAC REHAB PHASE I   PRE:  Rate/Rhythm: 76 SR  BP:  Supine:   Sitting: 137/89  Standing:    SaO2: 95%RA  MODE:  Ambulation: 550 ft   POST:  Rate/Rhythm: 93 SR  BP:  Supine:   Sitting: 167/97  Standing:    SaO2: 93%RA 0926-0950 Pt had already walked once but willing to walk again. Pt walked 550 ft with hand held asst. Gait steady. BP elevated after walk but tolerated well. Very motivated.   Graylon Good, RN BSN  08/24/2015 9:44 AM

## 2015-08-24 NOTE — Discharge Instructions (Signed)
Endoscopic Saphenous Vein Harvesting, Care After Refer to this sheet in the next few weeks. These instructions provide you with information on caring for yourself after your procedure. Your health care provider may also give you more specific instructions. Your treatment has been planned according to current medical practices, but problems sometimes occur. Call your health care provider if you have any problems or questions after your procedure. HOME CARE INSTRUCTIONS Medicine  Take whatever pain medicine your surgeon prescribes. Follow the directions carefully. Do not take over-the-counter pain medicine unless your surgeon says it is okay. Some pain medicine can cause bleeding problems for several weeks after surgery.  Follow your surgeon's instructions about driving. You will probably not be permitted to drive after heart surgery.  Take any medicines your surgeon prescribes. Any medicines you took before your heart surgery should be checked with your health care provider before you start taking them again. Wound care  If your surgeon has prescribed an elastic bandage or stocking, ask how long you should wear it.  Check the area around your surgical cuts (incisions) whenever your bandages (dressings) are changed. Look for any redness or swelling.  You will need to return to have the stitches (sutures) or staples taken out. Ask your surgeon when to do that.  Ask your surgeon when you can shower or bathe. Activity  Try to keep your legs raised when you are sitting.  Do any exercises your health care providers have given you. These may include deep breathing exercises, coughing, walking, or other exercises. SEEK MEDICAL CARE IF:  You have any questions about your medicines.  You have more leg pain, especially if your pain medicine stops working.  New or growing bruises develop on your leg.  Your leg swells, feels tight, or becomes red.  You have numbness in your leg. SEEK IMMEDIATE  MEDICAL CARE IF:  Your pain gets much worse.  Blood or fluid leaks from any of the incisions.  Your incisions become warm, swollen, or red.  You have chest pain.  You have trouble breathing.  You have a fever.  You have more pain near your leg incision. MAKE SURE YOU:  Understand these instructions.  Will watch your condition.  Will get help right away if you are not doing well or get worse.   This information is not intended to replace advice given to you by your health care provider. Make sure you discuss any questions you have with your health care provider.   Document Released: 03/27/2011 Document Revised: 08/05/2014 Document Reviewed: 03/27/2011 Elsevier Interactive Patient Education 2016 Else[employer]. Coronary Angiogram With Stent Coronary angiography with stent placement is a procedure to widen or open a narrow blood vessel of the heart (coronary artery). When a coronary artery becomes partially blocked, it decreases blood flow to that area. This may lead to chest pain or a heart attack (myocardial infarction). Arteries may become blocked by cholesterol buildup (plaque) in the lining or wall.  A stent is a small piece of metal that looks like a mesh or a spring. Stent placement may be done right after a coronary angiography in which a blocked artery is found or as a treatment for a heart attack.  LET [facility] CARE PROVIDER KNOW ABOUT:  Any allergies you have.   All medicines you are taking, including vitamins, herbs, eye drops, creams, and over-the-counter medicines.   Previous problems you or members of your family have had with the use of anesthetics.   Any blood disorders  you have.   Previous surgeries you have had.   Medical conditions you have. RISKS AND COMPLICATIONS Generally, coronary angiography with stent is a safe procedure. However, problems can occur and include:  Damage to the heart or its blood vessels.   A return of blockage.    Bleeding, infection, or bruising at the insertion site.   A collection of blood under the skin (hematoma) at the insertion site.  Blood clot in another part of the body.   Kidney injury.   Allergic reaction to the dye or contrast used.   Bleeding into the abdomen (retroperitoneal bleeding). BEFORE THE PROCEDURE  Do not eat or drink anything after midnight on the night before the procedure or as directed by your health care provider.  Ask your health care provider about changing or stopping your regular medicines. This is especially important if you are taking diabetes medicines or blood thinners.  Your health care provider will make sure you understand the procedure as well as the risks and potential problems associated with the procedure.  PROCEDURE  You may be given a medicine to help you relax before and during the procedure (sedative). This medicine will be given through an IV tube that is put into one of your veins.   The area where the catheter will be inserted will be shaved and cleaned. This is usually done in the groin but may be done in the fold of your arm (near your elbow) or in the wrist.   A medicine will be given to numb the area where the catheter will be inserted (local anesthetic).   The catheter will be inserted into an artery using a guide wire. A type of X-ray (fluoroscopy) will be used to help guide the catheter to the opening of the blocked artery.   A dye will then be injected into the catheter, and X-rays will be taken. The dye will help to show where any narrowing or blockages are located in the heart arteries.   A tiny wire will be guided to the blocked spot, and a balloon will be inflated to make the artery wider. The stent will be expanded and will crush the plaque into the wall of the vessel. The stent will hold the area open like a scaffolding and improve the blood flow.   Sometimes the artery may be made wider using a laser or other  tools to remove plaque.   When the blood flow is better, the catheter will be removed. The lining of the artery will grow over the stent, which stays where it was placed.  AFTER THE PROCEDURE  If the procedure is done through the leg, you will be kept in bed lying flat for about 6 hours. You will be instructed to not bend or cross your legs.   The insertion site will be checked frequently.   The pulse in your feet or wrist will be checked frequently.   Additional blood tests, X-rays, and electrocardiography may be done.   This information is not intended to replace advice given to you by your health care provider. Make sure you discuss any questions you have with your health care provider.   Document Released: 01/19/2003 Document Revised: 08/05/2014 Document Reviewed: 12/07/2012 Elsevier Interactive Patient Education 2016 Elsevier Inc. Coronary Artery Bypass Grafting, Care After These instructions give you information on caring for yourself after your procedure. Your doctor may also give you more specific instructions. Call your doctor if you have any problems or questions after your  procedure.  HOME CARE  Only take medicine as told by your doctor. Take medicines exactly as told. Do not stop taking medicines or start any new medicines without talking to your doctor first.  Take your pulse as told by your doctor.  Do deep breathing as told by your doctor. Use your breathing device (incentive spirometer), if given, to practice deep breathing several times a day. Support your chest with a pillow or your arms when you take deep breaths or cough.  Keep the area clean, dry, and protected where the surgery cuts (incisions) were made. Remove bandages (dressings) only as told by your doctor. If strips were applied to surgical area, do not take them off. They fall off on their own.  Check the surgery area daily for puffiness (swelling), redness, or leaking fluid.  If surgery cuts were made  in your legs:  Avoid crossing your legs.  Avoid sitting for long periods of time. Change positions every 30 minutes.  Raise your legs when you are sitting. Place them on pillows.  Wear stockings that help keep blood clots from forming in your legs (compression stockings).  Only take sponge baths until your doctor says it is okay to take showers. Pat the surgery area dry. Do not rub the surgery area with a washcloth or towel. Do not bathe, swim, or use a hot tub until your doctor says it is okay.  Eat foods that are high in fiber. These include raw fruits and vegetables, whole grains, beans, and nuts. Choose lean meats. Avoid canned, processed, and fried foods.  Drink enough fluids to keep your pee (urine) clear or pale yellow.  Weigh yourself every day.  Rest and limit activity as told by your doctor. You may be told to:  Stop any activity if you have chest pain, shortness of breath, changes in heartbeat, or dizziness. Get help right away if this happens.  Move around often for short amounts of time or take short walks as told by your doctor. Gradually become more active. You may need help to strengthen your muscles and build endurance.  Avoid lifting, pushing, or pulling anything heavier than 10 pounds (4.5 kg) for at least 6 weeks after surgery.  Do not drive until your doctor says it is okay.  Ask your doctor when you can go back to work.  Ask your doctor when you can begin sexual activity again.  Follow up with your doctor as told. GET HELP IF:  You have puffiness, redness, more pain, or fluid draining from the incision site.  You have a fever.  You have puffiness in your ankles or legs.  You have pain in your legs.  You gain 2 or more pounds (0.9 kg) a day.  You feel sick to your stomach (nauseous) or throw up (vomit).  You have watery poop (diarrhea). GET HELP RIGHT AWAY IF:  You have chest pain that goes to your jaw or arms.  You have shortness of  breath.  You have a fast or irregular heartbeat.  You notice a "clicking" in your breastbone when you move.  You have numbness or weakness in your arms or legs.  You feel dizzy or light-headed. MAKE SURE YOU:  Understand these instructions.  Will watch your condition.  Will get help right away if you are not doing well or get worse.   This information is not intended to replace advice given to you by your health care provider. Make sure you discuss any questions you have  with your health care provider.   Document Released: 07/20/2013 Document Reviewed: 07/20/2013 Elsevier Interactive Patient Education [employer].

## 2015-08-24 NOTE — Discharge Summary (Signed)
Physician Discharge Summary  Patient ID: CORBYN TRITCH MRN: [id number] DOB/AGE: June 17, 1951 65 y.o.  Admit date: 08/17/2015 Discharge date: 08/25/2015  Admission Diagnoses: Severe coronary artery disease with unstable angina.  Discharge Diagnoses:  Principal Problem:   Exertional angina ([facility]) Active Problems:   History of acute renal failure   Benign essential HTN   Hyperlipidemia   Abnormal result of cardiovascular function study   Chronic renal insufficiency, stage III (moderate)   Stented coronary artery  Patient Active Problem List   Diagnosis Date Noted  . Stented coronary artery 08/21/2015  . Abnormal result of cardiovascular function study 08/16/2015  . Chronic renal insufficiency, stage III (moderate) 08/16/2015  . Exertional angina ([facility]) 08/08/2015  . SOB (shortness of breath) 08/08/2015  . Benign essential HTN 08/08/2015  . Hyperlipidemia 08/08/2015  . Hydronephrosis, right 01/06/2014  . Ureteral calculus 01/06/2014  . History of acute renal failure 01/06/2014   History of present illness:  Patient is a 65 year old male who has recently been evaluated for chest pain and shortness of breath. He gets chest pain with physical exertion. Is primarily in the left upper chest with no radiation. He denies associated diaphoresis or nausea with the pain. Usually stops as he stops his exertional activity. He denies rest pain. He was admitted this hospitalization for cardiac catheterization to better delineate his anatomy and plan for any potential intervention.  Discharged Condition: good  Hospital Course: She underwent cardiac catheterization on 08/18/2015 and was found to have severe multivessel coronary artery disease. After full discussion with cardiothoracic surgery. As determined that he would best be served with a hybrid procedure with coronary bypassing to the LAD and diagonal coronary artery as well as a stent to the right coronary artery system. The patient remained medically  stable and on 08/21/2015 he was taken to the operating room where he underwent the below described combined hybrid procedure. He tolerated well and was taken to the surgical intensive care unit in stable condition. Postoperatively he has remained quite stable hemodynamically. He was weaned from the ventilator without difficulty using standard protocols. All routine lines, monitors and drainage devices have been discontinued in the standard fashion. Renal function has remained at baseline. He has some volume overload and is responding to diuretics. Incisions are healing well without evidence of infection. He is tolerating gradually increasing activities using standard protocols. He is remained in a sinus rhythm. He has a mild expected acute blood loss anemia which is stable. Currently his status felt to be stable for tentative discharge in the next 24-48 hours pending ongoing reevaluation of his recovery.  Consults: cardiology  Significant Diagnostic Studies: labs: routine post-op labs and serial CXR  Treatments: surgery:   DATE OF PROCEDURE: 08/21/2015 DATE OF DISCHARGE: 08/21/2015   OPERATIVE REPORT   PREOPERATIVE DIAGNOSIS: Coronary occlusive disease with unstable angina.  POSTOPERATIVE DIAGNOSIS: Coronary occlusive disease with unstable angina.  SURGICAL PROCEDURE: Coronary artery bypass grafting, off pump, with the left internal mammary to the left anterior descending coronary artery and reverse right greater saphenous vein and then has a hybrid procedure with Cardiology, and concomitant coronary angiogram and placement of proximal right coronary artery stent.  SURGEON: Lanelle Bal, M.D./ Irish Lack MD( stent)  FIRST ASSISTANT: John Giovanni, PA-C  Disposition: 01-Home or Self Care      Discharge Instructions    Amb Referral to Cardiac Rehabilitation    Complete by:  As directed   Diagnosis:  CABG  Medication List    STOP  taking these medications        metoprolol succinate 25 MG 24 hr tablet  Commonly known as:  TOPROL XL     nitroGLYCERIN 0.4 MG SL tablet  Commonly known as:  NITROSTAT      TAKE these medications        amLODipine 10 MG tablet  Commonly known as:  NORVASC  Take 10 mg by mouth daily.     aspirin EC 81 MG tablet  Take 1 tablet (81 mg total) by mouth daily.     atorvastatin 40 MG tablet  Commonly known as:  LIPITOR  Take 1 tablet (40 mg total) by mouth daily.     cephALEXin 500 MG capsule  Commonly known as:  KEFLEX  Take 1 capsule (500 mg total) by mouth 3 (three) times daily. For 10 dAYS     clopidogrel 75 MG tablet  Commonly known as:  PLAVIX  Take 1 tablet (75 mg total) by mouth daily.     guaiFENesin 600 MG 12 hr tablet  Commonly known as:  MUCINEX  Take 2 tablets (1,200 mg total) by mouth 2 (two) times daily as needed for cough.     metoprolol tartrate 25 MG tablet  Commonly known as:  LOPRESSOR  Take 1 tablet (25 mg total) by mouth 2 (two) times daily.     timolol 0.5 % ophthalmic solution  Commonly known as:  TIMOPTIC  Place 1 drop into both eyes 2 (two) times daily.     traMADol 50 MG tablet  Commonly known as:  ULTRAM  Take 1-2 tablets (50-100 mg total) by mouth every 4 (four) hours as needed for moderate pain.     travoprost (benzalkonium) 0.004 % ophthalmic solution  Commonly known as:  TRAVATAN  Place 1 drop into both eyes at bedtime.       Follow-up Information    Follow up with [name], MD.   Specialty:  Cardiothoracic Surgery   Contact information:   [address] [place] [address] [phone]       Follow up with Sueanne Margarita, MD.   Specialty:  Cardiology   Why:  The office will call.   Contact information:   [id number] [place] [address] [phone]      The patient has been discharged on:   1.Beta Blocker:  Yes [  y ]                              No   [   ]                               If No, reason:  2.Ace Inhibitor/ARB: Yes [   ]                                     No  [n    ]                                     If No, reason:renal insuff  3.Statin:   Yes [  y ]  No  [   ]                  If No, reason:  4.Ecasa:  Yes  [  y ]                  No   [   ]                  If No, reason:   Signed: Willmar Stockinger 08/25/2015, 2:33 PM

## 2015-08-25 LAB — GLUCOSE, CAPILLARY
GLUCOSE-CAPILLARY: 120 mg/dL — AB (ref 65–99)
GLUCOSE-CAPILLARY: 128 mg/dL — AB (ref 65–99)

## 2015-08-25 MED ORDER — CLOPIDOGREL BISULFATE 75 MG PO TABS: 75.0000 mg | ORAL_TABLET | Freq: Every day | ORAL | Status: DC

## 2015-08-25 MED ORDER — TRAMADOL HCL 50 MG PO TABS: 50.0000 mg | ORAL_TABLET | ORAL | Status: DC | PRN

## 2015-08-25 MED ORDER — GUAIFENESIN ER 600 MG PO TB12: 1200.0000 mg | ORAL_TABLET | Freq: Two times a day (BID) | ORAL | Status: DC | PRN

## 2015-08-25 MED ORDER — METOPROLOL TARTRATE 25 MG PO TABS: 25.0000 mg | ORAL_TABLET | Freq: Two times a day (BID) | ORAL | Status: DC

## 2015-08-25 MED ORDER — METOPROLOL TARTRATE 25 MG PO TABS
25.0000 mg | ORAL_TABLET | Freq: Two times a day (BID) | ORAL | Status: DC
  Administered 2015-08-25: 25 mg via ORAL
  Filled 2015-08-25: qty 1

## 2015-08-25 MED ORDER — CEPHALEXIN 500 MG PO CAPS: 500.0000 mg | ORAL_CAPSULE | Freq: Three times a day (TID) | ORAL | Status: DC

## 2015-08-25 MED ORDER — ASPIRIN EC 81 MG PO TBEC: 81.0000 mg | DELAYED_RELEASE_TABLET | Freq: Every day | ORAL | Status: DC

## 2015-08-25 NOTE — Progress Notes (Addendum)
CARDIAC REHAB PHASE I   PRE:  Rate/Rhythm: 74 SR  BP:  Supine:   Sitting: 138/86  Standing:    SaO2: 94%RA  MODE:  Ambulation: 690 ft   POST:  Rate/Rhythm: 95 SR  BP:  Supine:   Sitting: `49/87  Standing:    SaO2: 95%RA [id number] Pt walked 690 ft on RA with hand held asst with steady gait. Tolerated well. Education completed with pt who voiced understanding. Discussed CRP 2 and referring to [place]. Pt stated that when he talks he has to cough. Encouraged him to discuss with PA. Encouraged him to drink liquids to help liqufey secretions too. Wrote down how to view discharge video.   Graylon Good, RN BSN  08/25/2015 8:50 AM

## 2015-08-25 NOTE — Progress Notes (Signed)
Per Dr. Montine Circle pacer wires d/c with pt in bed laying down pt educated regarding procedure. Sutures taken out, pacer wires pulled and v/s taken Q15 min for one hour. Will continue to monitor. Vicente Males Therapist, sports

## 2015-08-25 NOTE — Progress Notes (Signed)
      [address]       [place] [address]             [phone]    4 Days Post-Op Procedure(s) (LRB): TRANSESOPHAGEAL ECHOCARDIOGRAM (TEE) (N/A) OFF PUMP CORONARY ARTERY BYPASS GRAFTING (CABG), TIMES TWO, USING LEFT INTERNAL MAMMARY ARTERY, RIGHT GREATER SAPHENOUS VEIN HARVESTED ENDOSCOPICALLY (N/A) ANGIOPLASTY WITH PERCUTANEOUS CORONARY INTERVENTION WITH DES TO RIGHT CORONARY ARTERY (N/A)   Subjective:  Jeffrey Holmes states he is feeling pretty good.  He would like to go home today.  He does complain of a "tickling" sensation in his throat, and difficulty with coughing.    Objective: Vital signs in last 24 hours: Temp:  [97.3 F (36.3 C)-98.6 F (37 C)] 98.4 F (36.9 C) (01/27 0441) Pulse Rate:  [77-84] 79 (01/27 0441) Cardiac Rhythm:  [-] Normal sinus rhythm (01/27 0700) Resp:  [18-20] 20 (01/27 0441) BP: (131-156)/(78-86) 131/79 mmHg (01/27 0441) SpO2:  [94 %-97 %] 94 % (01/27 0441) Weight:  [178 lb 9.6 oz (81.012 kg)] 178 lb 9.6 oz (81.012 kg) (01/27 0441)  Intake/Output from previous day: 01/26 0701 - 01/27 0700 In: 240 [P.O.:240] Out: -   General appearance: alert, cooperative and no distress Heart: regular rate and rhythm Lungs: clear to auscultation bilaterally Abdomen: soft, non-tender; bowel sounds normal; no masses,  no organomegaly Extremities: edema trace Wound: clean, some mild yellow slough at base of sternum, minimal erythema  Lab Results:  Recent Labs  08/23/15 0334 08/24/15 0352  WBC 11.5* 11.1*  HGB 12.6* 13.4  HCT 37.4* 38.7*  PLT 185 211   BMET:  Recent Labs  08/23/15 0334 08/24/15 0352  NA 135 141  K 3.6 3.8  CL 104 110  CO2 22 22  GLUCOSE 118* 115*  BUN 11 11  CREATININE 1.56* 1.56*  CALCIUM 8.1* 8.6*    PT/INR: No results for input(s): LABPROT, INR in the last 72 hours. ABG    Component Value Date/Time   PHART 7.329* 08/22/2015 0113   HCO3 20.0 08/22/2015 0113   TCO2 22 08/22/2015 1702   ACIDBASEDEF 6.0*  08/22/2015 0113   O2SAT 94.0 08/22/2015 0113   CBG (last 3)   Recent Labs  08/24/15 1644 08/24/15 2121 08/25/15 0605  GLUCAP 100* 111* 120*    Assessment/Plan: S/P Procedure(s) (LRB): TRANSESOPHAGEAL ECHOCARDIOGRAM (TEE) (N/A) OFF PUMP CORONARY ARTERY BYPASS GRAFTING (CABG), TIMES TWO, USING LEFT INTERNAL MAMMARY ARTERY, RIGHT GREATER SAPHENOUS VEIN HARVESTED ENDOSCOPICALLY (N/A) ANGIOPLASTY WITH PERCUTANEOUS CORONARY INTERVENTION WITH DES TO RIGHT CORONARY ARTERY (N/A)  1. CV- remains hemodynamically stable, BP improved but still slightly elevated- will increase Lopressor to 25 mg BID, and continue Norvasc 2. Pulm-no acute issues, off oxygen, continue IS... Cough will add Mucinex to help with sputum 3. Renal- creatinine at baseline, weight below admission not currently on diuretics 4. ID- small area of yellow slough on inferior portion of sternotomy, minimal drainage... Will start Keflex 5. dispo- patient is stable, increase Lopressor, Keflex for sternal drainage, D/C EPW... If remains stable d/c home this afternoon   LOS: 8 days    Jeffrey Holmes, Jeffrey Holmes 08/25/2015

## 2015-08-25 NOTE — Care Management Note (Signed)
Case Management Note  Patient Details  Name: Jeffrey Holmes MRN: [id number] Date of Birth: 03/27/1951  Subjective/Objective:    Lives at home with wife.  Wife states will be with him 24/7 on discharge. Independent prior to admission.  Looks good at this time.                 Action/Plan:  Pt will discharge home with wife as supervision/support   Expected Discharge Date:                  Expected Discharge Plan:  Home/Self Care  In-House Referral:  NA  Discharge planning Services  CM Consult  Post Acute Care Choice:    Choice offered to:     DME Arranged:    DME Agency:     HH Arranged:    [place] Agency:     Status of Service:  Completed, signed off  Medicare Important Message Given:    Date Medicare IM Given:    Medicare IM give by:    Date Additional Medicare IM Given:    Additional Medicare Important Message give by:     If discussed at [place] of Stay Meetings, dates discussed:    Additional Comments: Pt discharged home today 08/25/15.  No CM needs determined prior to discharge Maryclare Labrador, RN 08/25/2015, 4:36 PM

## 2015-08-28 DIAGNOSIS — Z736 Limitation of activities due to disability: Secondary | ICD-10-CM

## 2015-08-28 DIAGNOSIS — I2510 Atherosclerotic heart disease of native coronary artery without angina pectoris: Secondary | ICD-10-CM

## 2015-08-28 HISTORY — DX: Atherosclerotic heart disease of native coronary artery without angina pectoris: I25.10

## 2015-08-29 ENCOUNTER — Ambulatory Visit: Payer: Medicare Other | Admitting: Physician Assistant

## 2015-08-30 NOTE — Anesthesia Postprocedure Evaluation (Signed)
Anesthesia Post Note  Patient: Jeffrey Holmes  Procedure(s) Performed: Procedure(s) (LRB): TRANSESOPHAGEAL ECHOCARDIOGRAM (TEE) (N/A) OFF PUMP CORONARY ARTERY BYPASS GRAFTING (CABG), TIMES TWO, USING LEFT INTERNAL MAMMARY ARTERY, RIGHT GREATER SAPHENOUS VEIN HARVESTED ENDOSCOPICALLY (N/A) ANGIOPLASTY WITH PERCUTANEOUS CORONARY INTERVENTION WITH DES TO RIGHT CORONARY ARTERY (N/A)  Patient location during evaluation: SICU Anesthesia Type: General Level of consciousness: sedated Pain management: pain level controlled Vital Signs Assessment: post-procedure vital signs reviewed and stable Respiratory status: patient remains intubated per anesthesia plan and patient on ventilator - see flowsheet for VS Cardiovascular status: stable Anesthetic complications: no Comments: Delayed note, however pt evaluated in SICU     Last Vitals:  Filed Vitals:   08/25/15 1200 08/25/15 1353  BP: 150/86 128/76  Pulse:  72  Temp:  37 C  Resp:      Last Pain:  Filed Vitals:   08/25/15 1354  PainSc: Asleep                 Bruna Dills,E. Tykerria Mccubbins

## 2015-09-03 NOTE — Progress Notes (Signed)
Cardiology Office Note   Date:  09/04/2015   ID:  Jeffrey Holmes, DOB 01/31/1951, MRN [id number]  PCP:  Nanci Pina, FNP  Cardiologist:  Dr. Radford Pax    Chief Complaint  Patient presents with  . benign essential hypertension    post hospitalization, CABG      History of Present Illness: Jeffrey Holmes is a 65 y.o. male who presents for post CABG-hybrid.   Pt d/c'd 08/25/15 after exertional angina he underwent cath and was found to have severe multivessel coronary artery disease. After full discussion with cardiothoracic surgery. As determined that he would best be served with a hybrid procedure with coronary bypassing to the LAD and diagonal coronary artery as well as a stent to the right coronary artery system.  He underwent this on 08/21/15 He maintained SR.    08/21/15 Coronary artery bypass grafting, off pump, with the left internal mammary to the left anterior descending coronary artery and reverse right greater saphenous vein and then has a hybrid procedure with Cardiology, and concomitant coronary angiogram and placement of proximal right coronary artery stent.  Today BP is elevated and at home runs [id number] systolic at lowest.  His Cr at d/c was 1.56. Pt is having no pain no complaints.  He does have itching and burning at the electrode placement sites.  He is walking 15 min per day and plans to attend cardiac rehab.  He is now eating heart healthy diet.    Past Medical History  Diagnosis Date  . Hypertension   . Hypercholesteremia   . Chronic kidney disease, stage 3     multiple kidney stones  . H/O hiatal hernia   . Benign essential HTN 08/08/2015  . Hyperlipidemia 08/08/2015  . History of acute renal failure 2015    secondary to stones    Past Surgical History  Procedure Laterality Date  . Back surgery  2006    microdisectomy  . Lithotripsy      2-3 times in past  . Cystoscopy with retrograde pyelogram, ureteroscopy and stent placement Right 01/06/2014    Procedure:  CYSTOSCOPY WITH RETROGRADE PYELOGRAM, URETEROSCOPY AND STENT PLACEMENT;  Surgeon: Bernestine Amass, MD;  Location: [facility];  Service: Urology;  Laterality: Right;  . Cardiovascular stress test  08/15/2015  . Cardiac catheterization N/A 08/18/2015    Procedure: Left Heart Cath and Coronary Angiography;  Surgeon: Jettie Booze, MD;  Location: [facility];  Service: Cardiovascular;  Laterality: N/A;  . Cardiac catheterization N/A 08/21/2015    Procedure: Coronary Stent Intervention;  Surgeon: Jettie Booze, MD;  Location: [facility];  Service: Cardiovascular;  Laterality: N/A;  . Cardiac catheterization  08/21/2015    Procedure: Bypass Graft Angiography;  Surgeon: Jettie Booze, MD;  Location: [facility];  Service: Cardiovascular;;  . Tee without cardioversion N/A 08/21/2015    Procedure: TRANSESOPHAGEAL ECHOCARDIOGRAM (TEE);  Surgeon: [name], MD;  Location: [facility];  Service: Open Heart Surgery;  Laterality: N/A;  . Coronary artery bypass graft N/A 08/21/2015    Procedure: OFF PUMP CORONARY ARTERY BYPASS GRAFTING (CABG), TIMES TWO, USING LEFT INTERNAL MAMMARY ARTERY, RIGHT GREATER SAPHENOUS VEIN HARVESTED ENDOSCOPICALLY;  Surgeon: [name], MD;  Location: [facility];  Service: Open Heart Surgery;  Laterality: N/A;  . Angioplasty N/A 08/21/2015    Procedure: ANGIOPLASTY WITH PERCUTANEOUS CORONARY INTERVENTION WITH DES TO RIGHT CORONARY ARTERY;  Surgeon: Jettie Booze, MD;  Location: [facility];  Service:  Cardiovascular;  Laterality: N/A;     Current Outpatient Prescriptions  Medication Sig Dispense Refill  . amLODipine (NORVASC) 10 MG tablet Take 10 mg by mouth daily.    [name] aspirin EC 81 MG tablet Take 1 tablet (81 mg total) by mouth daily.    [name] atorvastatin (LIPITOR) 40 MG tablet Take 1 tablet (40 mg total) by mouth daily. 30 tablet 11  . cephALEXin (KEFLEX) 500 MG capsule Take 1 capsule (500 mg total) by mouth 3 (three) times daily. For 10 dAYS 30  capsule 0  . clopidogrel (PLAVIX) 75 MG tablet Take 1 tablet (75 mg total) by mouth daily. 30 tablet 3  . guaiFENesin (MUCINEX) 600 MG 12 hr tablet Take 2 tablets (1,200 mg total) by mouth 2 (two) times daily as needed for cough.    . nitroGLYCERIN (NITROSTAT) 0.4 MG SL tablet Place 0.4 mg under the tongue as needed. Place 1 tablet under your tongue every 5 minutes as needed for chest pain for a max of 3 doses    . timolol (TIMOPTIC) 0.5 % ophthalmic solution Place 1 drop into both eyes 2 (two) times daily.     . traMADol (ULTRAM) 50 MG tablet Take 1-2 tablets (50-100 mg total) by mouth every 4 (four) hours as needed for moderate pain. 30 tablet 0  . travoprost, benzalkonium, (TRAVATAN) 0.004 % ophthalmic solution Place 1 drop into both eyes at bedtime.    . metoprolol (LOPRESSOR) 50 MG tablet Take 1 tablet (50 mg total) by mouth 2 (two) times daily. 180 tablet 3   No current facility-administered medications for this visit.    Allergies:   Vicodin; Hydrocodone; Oxycontin; and Percocet    Social History:  The patient  reports that he has never smoked. He has never used smokeless tobacco. He reports that he drinks alcohol. He reports that he does not use illicit drugs.   Family History:  The patient's family history includes Kidney failure in his mother; Throat cancer in his father.    ROS:  General:no colds or fevers,  weight decreased post op.  Skin: rashes- where electrodes were, not near incision sites.  no ulcers  HEENT:no blurred vision, no congestion CV:see HPI PUL:see HPI GI:no diarrhea constipation or melena, no indigestion, good appetite  GU:no hematuria, no dysuria MS:no joint pain, no claudication Neuro:no syncope, no lightheadedness Endo:no diabetes, no thyroid disease  Wt Readings from Last 3 Encounters:  09/04/15 175 lb (79.379 kg)  08/25/15 178 lb 9.6 oz (81.012 kg)  08/16/15 189 lb (85.73 kg)     PHYSICAL EXAM: VS:  BP 160/90 mmHg  Pulse 72  Ht 5\' 5"  (1.651 m)   Wt 175 lb (79.379 kg)  BMI 29.12 kg/m2 , BMI Body mass index is 29.12 kg/(m^2). General:Pleasant affect, NAD Skin:Warm and dry, brisk capillary refill, round rash at site of electrodes.  HEENT:normocephalic, sclera clear, mucus membranes moist Neck:supple, no JVD, no bruits  Heart:S1S2 RRR without murmur, gallup, rub or click, chest incision healing nicely  Lungs:clear without rales, rhonchi, or wheezes [id number], non tender, + BS, do not palpate liver spleen or masses Ext:no lower ext edema, 2+ pedal pulses, 2+ radial pulses, rt leg incision for VG site stable + tender to touch.  Neuro:alert and oriented X 3, MAE, follows commands, + facial symmetry    EKG:  EKG is ordered today. The ekg ordered today demonstrates SR improved from 08/23/15 with flipped T waves now in V2V3, were elevated post op.    Recent Labs:  08/17/2015: TSH 3.479 08/19/2015: ALT 46 08/22/2015: Magnesium 2.6* 08/24/2015: BUN 11; Creatinine, Ser 1.56*; Hemoglobin 13.4; Platelets 211; Potassium 3.8; Sodium 141    Lipid Panel    Component Value Date/Time   CHOL 212* 08/19/2015 0435   TRIG 224* 08/19/2015 0435   HDL 35* 08/19/2015 0435   CHOLHDL 6.1 08/19/2015 0435   VLDL 45* 08/19/2015 0435   LDLCALC 132* 08/19/2015 0435       Other studies Reviewed: Additional studies/ records that were reviewed today include: . Cardiac cath:  Ost LAD to Mid LAD lesion, 80% stenosed.  Mid LAD lesion, 99% stenosed.  Prox RCA lesion, 70% stenosed.  Normal LVEDP  Severe LAD disease. Percutaneous revascularization would be difficult since the ostium of the vessel is involved and there have to be stent in the left main. The mid vessel lesion is subtotally occluded and there are no collaterals which would make wire manipulation higher risk. I think a LIMA to LAD would give the patient the best long-term result.  In regards to his RCA lesion, this appears amenable to percutaneous revascularization. I've spoken to Dr.  Roxan Hockey regarding a possible hybrid approach in which DES to the RCA would be performed while LIMA to LAD, potentially off-pump, could be performed. We'll discuss this further.  ECHO: Study Conclusions  - Left ventricle: There was mild hypertrophy of the posterior wall and moderate hypertrophy of the septum. Systolic function was normal. The estimated ejection fraction was in the range of 55% to 60%. Doppler parameters are consistent with abnormal left ventricular relaxation (grade 1 diastolic dysfunction). - Mitral valve: There was trivial regurgitation. - Right ventricle: The cavity size was normal. Wall thickness was normal. Systolic function was normal. - Atrial septum: No defect or patent foramen ovale was identified. - Pulmonic valve: There was trivial regurgitation. - Inferior vena cava: The vessel was normal in size. Consistent with normal central venous pressure.  ASSESSMENT AND PLAN:  1.  CAD with increasing angina prior to cath.    2. CABG  Coronary artery bypass grafting, off pump, with the left internal mammary to the left anterior descending coronary artery and reverse right greater saphenous vein and then has a hybrid procedure and STENT to pRCA follow up with Dr. Radford Pax in 6 weeks  Pt plans to attend cardiac rehab.  Will check CBC and BMP.  3. HTN- elevated increase metoprolol to 50 BID.   4. Hyperlipidemia continue statin may recheck in 6 weeks.    Current medicines are reviewed with the patient today.  The patient Has no concerns regarding medicines.  The following changes have been made:  See above Labs/ tests ordered today include:see above  Disposition:   FU:  see above  Signed, Isaiah Serge, NP  09/04/2015 9:00 AM    [facility], [place], [place], [place] Phone: [phone]; Fax: [phone]

## 2015-09-04 ENCOUNTER — Encounter: Payer: Self-pay | Admitting: Cardiology

## 2015-09-04 ENCOUNTER — Ambulatory Visit (INDEPENDENT_AMBULATORY_CARE_PROVIDER_SITE_OTHER): Payer: Medicare Other | Admitting: Cardiology

## 2015-09-04 VITALS — BP 160/90 | HR 72 | Ht 65.0 in | Wt 175.0 lb

## 2015-09-04 DIAGNOSIS — I2510 Atherosclerotic heart disease of native coronary artery without angina pectoris: Secondary | ICD-10-CM

## 2015-09-04 DIAGNOSIS — Z951 Presence of aortocoronary bypass graft: Secondary | ICD-10-CM | POA: Diagnosis not present

## 2015-09-04 DIAGNOSIS — Z79899 Other long term (current) drug therapy: Secondary | ICD-10-CM | POA: Diagnosis not present

## 2015-09-04 DIAGNOSIS — Z959 Presence of cardiac and vascular implant and graft, unspecified: Secondary | ICD-10-CM

## 2015-09-04 DIAGNOSIS — Z95820 Peripheral vascular angioplasty status with implants and grafts: Secondary | ICD-10-CM

## 2015-09-04 LAB — CBC
HCT: 41.1 % (ref 39.0–52.0)
Hemoglobin: 14.3 g/dL (ref 13.0–17.0)
MCH: 31.3 pg (ref 26.0–34.0)
MCHC: 34.8 g/dL (ref 30.0–36.0)
MCV: 89.9 fL (ref 78.0–100.0)
MPV: 9.3 fL (ref 8.6–12.4)
PLATELETS: 519 10*3/uL — AB (ref 150–400)
RBC: 4.57 MIL/uL (ref 4.22–5.81)
RDW: 13.6 % (ref 11.5–15.5)
WBC: 9.7 10*3/uL (ref 4.0–10.5)

## 2015-09-04 LAB — BASIC METABOLIC PANEL
BUN: 14 mg/dL (ref 7–25)
CHLORIDE: 103 mmol/L (ref 98–110)
CO2: 24 mmol/L (ref 20–31)
CREATININE: 1.73 mg/dL — AB (ref 0.70–1.25)
Calcium: 9.5 mg/dL (ref 8.6–10.3)
Glucose, Bld: 106 mg/dL — ABNORMAL HIGH (ref 65–99)
POTASSIUM: 4.6 mmol/L (ref 3.5–5.3)
Sodium: 141 mmol/L (ref 135–146)

## 2015-09-04 MED ORDER — METOPROLOL TARTRATE 50 MG PO TABS: 50.0000 mg | ORAL_TABLET | Freq: Two times a day (BID) | ORAL | Status: DC

## 2015-09-04 NOTE — Patient Instructions (Signed)
Medication Instructions:  1) INCREASE your Metoprolol Tartrate to 50mg  twice daily  Labwork: BMET and CBC today  Testing/Procedures: None  Follow-Up: Your physician recommends that you schedule a follow-up appointment in: 6 weeks with Dr. Radford Pax.   Any Other Special Instructions Will Be Listed Below (If Applicable).     If you need a refill on your cardiac medications before your next appointment, please call your pharmacy.

## 2015-09-05 ENCOUNTER — Telehealth: Payer: Self-pay | Admitting: *Deleted

## 2015-09-05 DIAGNOSIS — I10 Essential (primary) hypertension: Secondary | ICD-10-CM

## 2015-09-05 NOTE — Telephone Encounter (Signed)
Pt aware of lab results and will come in 09/12/15 to recheck lab.  Lab order put in EPIC. Pt verbalized understanding

## 2015-09-05 NOTE — Telephone Encounter (Signed)
-----   Message from Isaiah Serge, NP sent at 09/05/2015 11:11 AM EST ----- None of his meds. Are causing this.  That is about where he runs.  In hospital he rec'd extra fluids and it was a little lower.  We can recheck in a week.  ----- Message -----    From: Jeanann Lewandowsky, RMA    Sent: 09/05/2015  10:30 AM      To: Isaiah Serge, NP  Before I call the pt back, what could be making his Creatine level increase?  Pt already was told the level, and he knows it has went up, so in case he ask, please advise!

## 2015-09-12 ENCOUNTER — Other Ambulatory Visit (INDEPENDENT_AMBULATORY_CARE_PROVIDER_SITE_OTHER): Payer: Medicare Other | Admitting: *Deleted

## 2015-09-12 DIAGNOSIS — I10 Essential (primary) hypertension: Secondary | ICD-10-CM | POA: Diagnosis not present

## 2015-09-12 LAB — BASIC METABOLIC PANEL
BUN: 17 mg/dL (ref 7–25)
CO2: 23 mmol/L (ref 20–31)
Calcium: 10.1 mg/dL (ref 8.6–10.3)
Chloride: 104 mmol/L (ref 98–110)
Creat: 1.75 mg/dL — ABNORMAL HIGH (ref 0.70–1.25)
Glucose, Bld: 98 mg/dL (ref 65–99)
Potassium: 5.8 mmol/L — ABNORMAL HIGH (ref 3.5–5.3)
Sodium: 140 mmol/L (ref 135–146)

## 2015-09-13 ENCOUNTER — Telehealth: Payer: Self-pay | Admitting: *Deleted

## 2015-09-13 MED ORDER — SODIUM POLYSTYRENE SULFONATE 15 GM/60ML PO SUSP: 15.0000 g | Freq: Once | ORAL | Status: DC

## 2015-09-13 NOTE — Telephone Encounter (Signed)
Pt has been made of his lab results and to take 20 Kayelexate once and repeat labs on 09/14/15. Order has been put in epic.

## 2015-09-19 ENCOUNTER — Other Ambulatory Visit (INDEPENDENT_AMBULATORY_CARE_PROVIDER_SITE_OTHER): Payer: Medicare Other | Admitting: *Deleted

## 2015-09-19 DIAGNOSIS — E785 Hyperlipidemia, unspecified: Secondary | ICD-10-CM

## 2015-09-19 LAB — LIPID PANEL
CHOL/HDL RATIO: 6.7 ratio — AB (ref <=5.0)
CHOLESTEROL: 227 mg/dL — AB (ref 125–200)
HDL: 34 mg/dL — AB (ref >=40)
LDL Cholesterol: 150 mg/dL — ABNORMAL HIGH (ref <130)
TRIGLYCERIDES: 217 mg/dL — AB (ref <150)
VLDL: 43 mg/dL — AB (ref <30)

## 2015-09-19 LAB — HEPATIC FUNCTION PANEL
ALBUMIN: 3.6 g/dL (ref 3.6–5.1)
ALT: 21 U/L (ref 9–46)
AST: 15 U/L (ref 10–35)
Alkaline Phosphatase: 94 U/L (ref 40–115)
BILIRUBIN DIRECT: 0.1 mg/dL (ref <=0.2)
BILIRUBIN TOTAL: 0.6 mg/dL (ref 0.2–1.2)
Indirect Bilirubin: 0.5 mg/dL (ref 0.2–1.2)
Total Protein: 6.9 g/dL (ref 6.1–8.1)

## 2015-09-21 ENCOUNTER — Encounter (HOSPITAL_COMMUNITY): Payer: Self-pay | Admitting: Interventional Cardiology

## 2015-09-22 ENCOUNTER — Other Ambulatory Visit: Payer: Self-pay | Admitting: Cardiothoracic Surgery

## 2015-09-22 DIAGNOSIS — Z951 Presence of aortocoronary bypass graft: Secondary | ICD-10-CM

## 2015-09-25 ENCOUNTER — Ambulatory Visit (INDEPENDENT_AMBULATORY_CARE_PROVIDER_SITE_OTHER): Payer: Self-pay | Admitting: Surgical

## 2015-09-25 ENCOUNTER — Other Ambulatory Visit: Payer: Self-pay | Admitting: *Deleted

## 2015-09-25 ENCOUNTER — Ambulatory Visit
Admission: RE | Admit: 2015-09-25 | Discharge: 2015-09-25 | Disposition: A | Discharge status: Home / Self Care | Payer: Medicare Other | Source: Ambulatory Visit | Attending: Cardiothoracic Surgery | Admitting: Cardiothoracic Surgery

## 2015-09-25 ENCOUNTER — Telehealth (HOSPITAL_COMMUNITY): Payer: Self-pay | Admitting: *Deleted

## 2015-09-25 VITALS — BP 160/86 | HR 60 | Resp 16 | Ht 65.0 in | Wt 175.0 lb

## 2015-09-25 DIAGNOSIS — Z951 Presence of aortocoronary bypass graft: Secondary | ICD-10-CM

## 2015-09-25 DIAGNOSIS — R05 Cough: Secondary | ICD-10-CM | POA: Diagnosis not present

## 2015-09-25 DIAGNOSIS — I25110 Atherosclerotic heart disease of native coronary artery with unstable angina pectoris: Secondary | ICD-10-CM

## 2015-09-25 NOTE — Progress Notes (Signed)
[address]       [place] [address]             [phone]                  Chaston B Behney Ollie Medical Record [id number] Date of Birth: July 12, 1951  Referring [id number], Charlann Lange, MD Primary Cardiology: Primary Care:Takia Rojelio Brenner, FNP  Chief Complaint:  Follow Up Visit   DATE OF PROCEDURE: 08/21/2015 DATE OF DISCHARGE: 08/21/2015   OPERATIVE REPORT   PREOPERATIVE DIAGNOSIS: Coronary occlusive disease with unstable angina.  POSTOPERATIVE DIAGNOSIS: Coronary occlusive disease with unstable angina.  SURGICAL PROCEDURE: Coronary artery bypass grafting, off pump, with the left internal mammary to the left anterior descending coronary artery and reverse right greater saphenous vein and then has a hybrid procedure with Cardiology, and concomitant coronary angiogram and placement of proximal right coronary artery stent.  SURGEON: Lanelle Bal, M.D.  FIRST ASSISTANT: John Giovanni, PA-C   History of Present Illness:    The patient is a 65 year old male status post a above described procedures seen in routine office visit follow-up today. He has been seen by cardiology post discharge and has another appointment scheduled in early April with Dr. Radford Pax. He reports that he has only some minor discomfort from the chest incision but otherwise no specific complaints. He drove to the office today and did not have any difficulty. He is ambulating greater than 1 half mile daily without difficulty.     Zubrod Score: At the time of surgery this patient's most appropriate activity status/level should be described as: []     0    Normal activity, no symptoms []     1    Restricted in physical strenuous activity but ambulatory, able to do out light work []     2    Ambulatory and capable of self care, unable to do work activities, up and about                 >50 % of waking hours                                                                                    []     3    Only limited self care, in bed greater than 50% of waking hours []     4    Completely disabled, no self care, confined to bed or chair []     5    Moribund  History  Smoking status  . Never Smoker   Smokeless tobacco  . Never Used       Allergies  Allergen Reactions  . Vicodin [Hydrocodone-Acetaminophen] Nausea And Vomiting  . Hydrocodone Nausea And Vomiting  . Oxycontin [Oxycodone Hcl] Nausea And Vomiting  . Percocet [Oxycodone-Acetaminophen] Nausea And Vomiting    Current Outpatient Prescriptions  Medication Sig Dispense Refill  . amLODipine (NORVASC) 10 MG tablet Take 10 mg by mouth daily.    [name] aspirin EC 81 MG tablet Take 1 tablet (81 mg total) by mouth daily.    [name] atorvastatin (LIPITOR) 40 MG tablet Take 1 tablet (40 mg total) by mouth  daily. 30 tablet 11  . clopidogrel (PLAVIX) 75 MG tablet Take 1 tablet (75 mg total) by mouth daily. 30 tablet 3  . metoprolol (LOPRESSOR) 50 MG tablet Take 1 tablet (50 mg total) by mouth 2 (two) times daily. 180 tablet 3  . nitroGLYCERIN (NITROSTAT) 0.4 MG SL tablet Place 0.4 mg under the tongue as needed. Place 1 tablet under your tongue every 5 minutes as needed for chest pain for a max of 3 doses    . timolol (TIMOPTIC) 0.5 % ophthalmic solution Place 1 drop into both eyes 2 (two) times daily.     . travoprost, benzalkonium, (TRAVATAN) 0.004 % ophthalmic solution Place 1 drop into both eyes at bedtime.     No current facility-administered medications for this visit.       Physical Exam: BP 160/86 mmHg  Pulse 60  Resp 16  Ht 5\' 5"  (1.651 m)  Wt 175 lb (79.379 kg)  BMI 29.12 kg/m2  SpO2 98%  General appearance: alert, cooperative and no distress Heart: regular rate and rhythm Lungs: clear to auscultation bilaterally Abdomen: Benign exam Extremities: No edema Wounds: Incisions healing well without evidence of infection.  Diagnostic Studies & Laboratory data:         Recent  Radiology Findings: Dg Chest 2 View  09/25/2015  CLINICAL DATA:  Post CABG with mild cough. EXAM: CHEST  2 VIEW COMPARISON:  08/24/2015 chest radiograph. FINDINGS: Sternotomy wires appear aligned and intact. CABG clips overlie the mediastinum. Stable cardiomediastinal silhouette with normal heart size. No pneumothorax. No pleural effusion. No pulmonary edema. Mild curvilinear opacities at both lung bases, decreased bilaterally. No acute consolidative airspace disease. IMPRESSION: Mild bibasilar curvilinear lung opacities, decreased bilaterally, in keeping with improving bibasilar atelectasis. Electronically Signed   By: Ilona Sorrel M.D.   On: 09/25/2015 12:14      I have independently reviewed the above radiology findings and reviewed findings  with the patient.  Recent Labs: Lab Results  Component Value Date   WBC 9.7 09/04/2015   HGB 14.3 09/04/2015   HCT 41.1 09/04/2015   PLT 519* 09/04/2015   GLUCOSE 98 09/12/2015   CHOL 227* 09/19/2015   TRIG 217* 09/19/2015   HDL 34* 09/19/2015   LDLCALC 150* 09/19/2015   ALT 21 09/19/2015   AST 15 09/19/2015   NA 140 09/12/2015   K 5.8* 09/12/2015   CL 104 09/12/2015   CREATININE 1.75* 09/12/2015   BUN 17 09/12/2015   CO2 23 09/12/2015   TSH 3.479 08/17/2015   INR 1.27 08/21/2015   HGBA1C 6.9* 08/19/2015      Assessment / Plan:        The patient is doing quite well. He is not having any specific difficulties. He apparently did stop taking his Plavix when he ran out and our office nurse Brantley Stage is calling in a prescription for this. We discussed routine postop progression of activities, etc. at this point and he understands. As there is no specific surgical issues at this time we will see him again on a when necessary basis or at request.  Sem Mccaughey E 09/25/2015 1:05 PM

## 2015-09-25 NOTE — Patient Instructions (Signed)
The patient understands activity progression which was discussed in detail.

## 2015-09-25 NOTE — Telephone Encounter (Signed)
Received updated order from Dr. Radford Pax.  Pt called and message left for pt to please contact for sign up.  Availability and contact information provided. Cherre Huger, BSN

## 2015-09-26 ENCOUNTER — Telehealth: Payer: Self-pay | Admitting: *Deleted

## 2015-09-26 NOTE — Telephone Encounter (Signed)
Please have this patient take 300 mg Plavix x 1 today since he has a recent stent. Then he can resume 75 mg daily. Please stress the importance of taking his meds as directed.         JV    Left message to call back

## 2015-09-26 NOTE — Telephone Encounter (Signed)
Follow up ° ° ° ° ° °Returning a call to the nurse °

## 2015-09-26 NOTE — Telephone Encounter (Signed)
Spoke with pt and informed of new orders per Dr. Irish Lack. Informed pt of importance of taking this medication. Pt verbalized understanding and was in agreement with this plan.

## 2015-10-12 ENCOUNTER — Encounter (HOSPITAL_COMMUNITY)
Admission: RE | Admit: 2015-10-12 | Discharge: 2015-10-12 | Disposition: A | Discharge status: Home / Self Care | Payer: Federal, State, Local not specified - PPO | Source: Ambulatory Visit | Attending: Cardiology | Admitting: Cardiology

## 2015-10-12 ENCOUNTER — Encounter (HOSPITAL_COMMUNITY): Payer: Self-pay

## 2015-10-12 VITALS — Ht 66.25 in | Wt 182.1 lb

## 2015-10-12 DIAGNOSIS — Z951 Presence of aortocoronary bypass graft: Secondary | ICD-10-CM | POA: Diagnosis not present

## 2015-10-12 NOTE — Progress Notes (Signed)
Cardiac Individual Treatment Plan  Patient Details  Name: Jeffrey Holmes MRN: [id number] Date of Birth: 06-20-1951 Referring Provider:  Sueanne Margarita, MD  Initial Encounter Date:       CARDIAC REHAB PHASE II ORIENTATION from 10/12/2015 in [place]   Date  10/12/15      Visit Diagnosis: S/P CABG x 2  Patient's Home Medications on Admission:  Current outpatient prescriptions:  .  aspirin EC 81 MG tablet, Take 1 tablet (81 mg total) by mouth daily., Disp: , Rfl:  .  atorvastatin (LIPITOR) 40 MG tablet, Take 1 tablet (40 mg total) by mouth daily., Disp: 30 tablet, Rfl: 11 .  clopidogrel (PLAVIX) 75 MG tablet, Take 1 tablet (75 mg total) by mouth daily., Disp: 30 tablet, Rfl: 3 .  metoprolol (LOPRESSOR) 50 MG tablet, Take 1 tablet (50 mg total) by mouth 2 (two) times daily., Disp: 180 tablet, Rfl: 3 .  nitroGLYCERIN (NITROSTAT) 0.4 MG SL tablet, Place 0.4 mg under the tongue as needed. Place 1 tablet under your tongue every 5 minutes as needed for chest pain for a max of 3 doses, Disp: , Rfl:  .  timolol (TIMOPTIC) 0.5 % ophthalmic solution, Place 1 drop into both eyes 2 (two) times daily. , Disp: , Rfl:  .  travoprost, benzalkonium, (TRAVATAN) 0.004 % ophthalmic solution, Place 1 drop into both eyes at bedtime., Disp: , Rfl:  .  amLODipine (NORVASC) 10 MG tablet, Take 10 mg by mouth daily. Reported on 10/12/2015, Disp: , Rfl:   Past Medical History: Past Medical History  Diagnosis Date  . Hypertension   . Hypercholesteremia   . Chronic kidney disease, stage 3     multiple kidney stones  . H/O hiatal hernia   . Benign essential HTN 08/08/2015  . Hyperlipidemia 08/08/2015  . History of acute renal failure 2015    secondary to stones    Tobacco Use: History  Smoking status  . Never Smoker   Smokeless tobacco  . Never Used    Labs: Recent Review Flowsheet Data    Labs for ITP Cardiac and Pulmonary Rehab Latest Ref Rng 08/21/2015 08/21/2015 08/22/2015  08/22/2015 09/19/2015   Cholestrol 125 - 200 mg/dL - - - - 227(H)   LDLCALC <130 mg/dL - - - - 150(H)   HDL >=40 mg/dL - - - - 34(L)   Trlycerides <150 mg/dL - - - - 217(H)   PHART 7.350 - 7.450 7.323(L) 7.349(L) 7.329(L) - -   PCO2ART 35.0 - 45.0 mmHg 41.4 36.3 37.7 - -   HCO3 20.0 - 24.0 mEq/L 21.5 20.0 20.0 - -   TCO2 0 - 100 mmol/L 23 21 21 22  -   ACIDBASEDEF 0.0 - 2.0 mmol/L 4.0(H) 5.0(H) 6.0(H) - -   O2SAT - 95.0 96.0 94.0 - -      Capillary Blood Glucose: Lab Results  Component Value Date   GLUCAP 128* 08/25/2015   GLUCAP 120* 08/25/2015   GLUCAP 111* 08/24/2015   GLUCAP 100* 08/24/2015   GLUCAP 111* 08/24/2015     Exercise Target Goals: Date: 10/12/15  Exercise Program Goal: Individual exercise prescription set with THRR, safety & activity barriers. Participant demonstrates ability to understand and report RPE using BORG scale, to self-measure pulse accurately, and to acknowledge the importance of the exercise prescription.  Exercise Prescription Goal: Starting with aerobic activity 30 plus minutes a day, 3 days per week for initial exercise prescription. Provide home exercise prescription and guidelines that  participant acknowledges understanding prior to discharge.  Activity Barriers & Risk Stratification:     Activity Barriers & Cardiac Risk Stratification - 10/12/15 1549    Activity Barriers & Cardiac Risk Stratification   Activity Barriers None      6 Minute Walk:     6 Minute Walk      10/12/15 1034       6 Minute Walk   Phase Initial     Distance 1683 feet     Walk Time 6 minutes     MPH 3.19     METS 3.69     RPE 9     Perceived Dyspnea  0     VO2 Peak 12.93     Symptoms No     Resting HR 76 bpm     Resting BP 142/78 mmHg     Max Ex. HR 98 bpm     Max Ex. BP 136/84 mmHg     Pre/Post BP   Baseline BP 142/78 mmHg     6 Minute BP 136/84 mmHg     2 Minute Post BP 134/84 mmHg     Pre/Post BP? Yes        Initial Exercise Prescription:      Initial Exercise Prescription - 10/12/15 1200    Date of Initial Exercise Prescription   Date 10/12/15   Treadmill   MPH 3   Grade 1   Minutes 10   METs 3.69   Bike   Level 1.2   Watts 60   Minutes 10   METs 3.69   NuStep   Level 3   Watts 60   Minutes 10   METs 3.69   Prescription Details   Frequency (times per week) 3   Duration Progress to 30 minutes of continuous aerobic without signs/symptoms of physical distress   Intensity   THRR 40-80% of Max Heartrate 62-123   Ratings of Perceived Exertion 11-13   Perceived Dyspnea 0-4   Progression   Progression Continue to progress workloads to maintain intensity without signs/symptoms of physical distress.   Resistance Training   Training Prescription No      Perform Capillary Blood Glucose checks as needed.  Exercise Prescription Changes:   Exercise Comments:   Discharge Exercise Prescription (Final Exercise Prescription Changes):   Nutrition:  Target Goals: Understanding of nutrition guidelines, daily intake of sodium 1500mg , cholesterol 200mg , calories 30% from fat and 7% or less from saturated fats, daily to have 5 or more servings of fruits and vegetables.  Biometrics:     Pre Biometrics - 10/12/15 1210    Pre Biometrics   Height 5' 6.25" (1.683 m)   Weight 182 lb 1.6 oz (82.6 kg)   Waist Circumference 39 inches   Hip Circumference 39 inches   Waist to Hip Ratio 1 %   BMI (Calculated) 29.2   Triceps Skinfold 13 mm   % Body Fat 27.2 %   Grip Strength 46 kg   Flexibility 7.5 in   Single Leg Stand 7.3 seconds       Nutrition Therapy Plan and Nutrition Goals:     Nutrition Therapy & Goals - 10/12/15 1521    Nutrition Therapy   Diet Therapeutic Lifestyle Changes, Consistent CHO   Personal Nutrition Goals   Personal Goal #1 6-20 lb wt loss at graduation from Cardiac Rehab   Personal Goal #2 Basic understanding of diabetic diet principles (A1c 6.9)   Intervention Plan   Intervention  Prescribe, educate  and counsel regarding individualized specific dietary modifications aiming towards targeted core components such as weight, hypertension, lipid management, diabetes, heart failure and other comorbidities.   Expected Outcomes Short Term Goal: Understand basic principles of dietary content, such as calories, fat, sodium, cholesterol and nutrients.;Long Term Goal: Adherence to prescribed nutrition plan.      Nutrition Discharge: Rate Your Plate Scores:   Nutrition Goals Re-Evaluation:   Psychosocial: Target Goals: Acknowledge presence or absence of depression, maximize coping skills, provide positive support system. Participant is able to verbalize types and ability to use techniques and skills needed for reducing stress and depression.  Initial Review & Psychosocial Screening:   Quality of Life Scores:   PHQ-9:     Recent Review Flowsheet Data    There is no flowsheet data to display.      Psychosocial Evaluation and Intervention:   Psychosocial Re-Evaluation:   Vocational Rehabilitation: Provide vocational rehab assistance to qualifying candidates.   Vocational Rehab Evaluation & Intervention:   Education: Education Goals: Education classes will be provided on a weekly basis, covering required topics. Participant will state understanding/return demonstration of topics presented.  Learning Barriers/Preferences:     Learning Barriers/Preferences - 10/12/15 [id number]    Learning Barriers/Preferences   Learning Barriers Sight  reading glasses   Learning Preferences Group Instruction;Individual Instruction;Verbal Instruction;Written Material      Education Topics: Count Your Pulse:  -Group instruction provided by verbal instruction, demonstration, patient participation and written materials to support subject.  Instructors address importance of being able to find your pulse and how to count your pulse when at home without a heart monitor.  Patients get  hands on experience counting their pulse with staff help and individually.   Heart Attack, Angina, and Risk Factor Modification:  -Group instruction provided by verbal instruction, video, and written materials to support subject.  Instructors address signs and symptoms of angina and heart attacks.    Also discuss risk factors for heart disease and how to make changes to improve heart health risk factors.   Functional Fitness:  -Group instruction provided by verbal instruction, demonstration, patient participation, and written materials to support subject.  Instructors address safety measures for doing things around the house.  Discuss how to get up and down off the floor, how to pick things up properly, how to safely get out of a chair without assistance, and balance training.   Meditation and Mindfulness:  -Group instruction provided by verbal instruction, patient participation, and written materials to support subject.  Instructor addresses importance of mindfulness and meditation practice to help reduce stress and improve awareness.  Instructor also leads participants through a meditation exercise.    Stretching for Flexibility and Mobility:  -Group instruction provided by verbal instruction, patient participation, and written materials to support subject.  Instructors lead participants through series of stretches that are designed to increase flexibility thus improving mobility.  These stretches are additional exercise for major muscle groups that are typically performed during regular warm up and cool down.   Hands Only CPR Anytime:  -Group instruction provided by verbal instruction, video, patient participation and written materials to support subject.  Instructors co-teach with AHA video for hands only CPR.  Participants get hands on experience with mannequins.   Nutrition I class: Heart Healthy Eating:  -Group instruction provided by PowerPoint slides, verbal discussion, and written  materials to support subject matter. The instructor gives an explanation and review of the Therapeutic Lifestyle Changes diet recommendations, which includes a discussion on lipid  goals, dietary fat, sodium, fiber, plant stanol/sterol esters, sugar, and the components of a well-balanced, healthy diet.   Nutrition II class: Lifestyle Skills:  -Group instruction provided by PowerPoint slides, verbal discussion, and written materials to support subject matter. The instructor gives an explanation and review of label reading, grocery shopping for heart health, heart healthy recipe modifications, and ways to make healthier choices when eating out.   Diabetes Question & Answer:  -Group instruction provided by PowerPoint slides, verbal discussion, and written materials to support subject matter. The instructor gives an explanation and review of diabetes co-morbidities, pre- and post-prandial blood glucose goals, pre-exercise blood glucose goals, signs, symptoms, and treatment of hypoglycemia and hyperglycemia, and foot care basics.   Diabetes Blitz:  -Group instruction provided by PowerPoint slides, verbal discussion, and written materials to support subject matter. The instructor gives an explanation and review of the physiology behind type 1 and type 2 diabetes, diabetes medications and rational behind using different medications, pre- and post-prandial blood glucose recommendations and Hemoglobin A1c goals, diabetes diet, and exercise including blood glucose guidelines for exercising safely.    Portion Distortion:  -Group instruction provided by PowerPoint slides, verbal discussion, written materials, and food models to support subject matter. The instructor gives an explanation of serving size versus portion size, changes in portions sizes over the last 20 years, and what consists of a serving from each food group.   Stress Management:  -Group instruction provided by verbal instruction, video, and  written materials to support subject matter.  Instructors review role of stress in heart disease and how to cope with stress positively.     Exercising on Your Own:  -Group instruction provided by verbal instruction, power point, and written materials to support subject.  Instructors discuss benefits of exercise, components of exercise, frequency and intensity of exercise, and end points for exercise.  Also discuss use of nitroglycerin and activating EMS.  Review options of places to exercise outside of rehab.  Review guidelines for sex with heart disease.   Cardiac Drugs I:  -Group instruction provided by verbal instruction and written materials to support subject.  Instructor reviews cardiac drug classes: antiplatelets, anticoagulants, beta blockers, and statins.  Instructor discusses reasons, side effects, and lifestyle considerations for each drug class.   Cardiac Drugs II:  -Group instruction provided by verbal instruction and written materials to support subject.  Instructor reviews cardiac drug classes: angiotensin converting enzyme inhibitors (ACE-I), angiotensin II receptor blockers (ARBs), nitrates, and calcium channel blockers.  Instructor discusses reasons, side effects, and lifestyle considerations for each drug class.   Anatomy and Physiology of the Circulatory System:  -Group instruction provided by verbal instruction, video, and written materials to support subject.  Reviews functional anatomy of heart, how it relates to various diagnoses, and what role the heart plays in the overall system.   Knowledge Questionnaire Score:   Personal Goals and Risk Factors at Admission:     Personal Goals and Risk Factors at Admission - 10/12/15 0803    Core Components/Risk Factors/Patient Goals on Admission    Weight Management Yes   Intervention Weight Management: Develop a combined nutrition and exercise program designed to reach desired caloric intake, while maintaining appropriate  intake of nutrient and fiber, sodium and fats, and appropriate energy expenditure required for the weight goal.;Weight Management: Provide education and appropriate resources to help participant work on and attain dietary goals.   Admit Weight 182 lb 1.6 oz (82.6 kg)   Goal Weight: Short Term 175  lb (79.379 kg)   Goal Weight: Long Term 165 lb (74.844 kg)   Expected Outcomes Short Term: Continue to assess and modify interventions until short term weight is achieved.;Long Term: Adherence to nutrition and physical activity/exercise program aimed toward attainment of established weight goal.   Hypertension Yes   Intervention Provide education on lifestyle modifcations including regular physical activity/exercise, weight management, moderate sodium restriction and increased consumption of fresh fruit, vegetables, and low fat dairy, alcohol moderation, and smoking cessation.;Monitor prescription use compliance.   Expected Outcomes Short Term: Continued assessment and intervention until BP is < 140/47mm HG in hypertensive participants. < 130/65mm HG in hypertensive participants with diabetes, heart failure or chronic kidney disease.;Long Term: Maintenance of blood pressure at goal levels.   Lipids Yes   Intervention Provide education and support for participant on nutrition & aerobic/resistive exercise along with prescribed medications to achieve LDL 70mg , HDL >40mg .   Expected Outcomes Short Term: Participant states understanding of desired cholesterol values and is compliant with medications prescribed. Participant is following exercise prescription and nutrition guidelines.;Long Term: Cholesterol controlled with medications as prescribed, with individualized exercise RX and with personalized nutrition plan. Value goals: LDL < 70mg , HDL > 40 mg.   Stress Yes   Intervention Offer individual and/or small group education and counseling on adjustment to heart disease, stress management and health-related  lifestyle change. Teach and support self-help strategies.;Refer participants experiencing significant psychosocial distress to appropriate mental health specialists for further evaluation and treatment. When possible, include family members and significant others in education/counseling sessions.   Expected Outcomes Short Term: Participant demonstrates changes in health-related behavior, relaxation and other stress management skills, ability to obtain effective social support, and compliance with psychotropic medications if prescribed.;Long Term: Emotional wellbeing is indicated by absence of clinically significant psychosocial distress or social isolation.   Personal Goal Other Yes   Personal Goal Back to tennis (increase stamina)   Intervention Provide exercise prescription and education for improving functional capacity   Expected Outcomes Able to exercise independently with confidence on knowing limits      Personal Goals and Risk Factors Review:    Personal Goals Discharge (Final Personal Goals and Risk Factors Review):    ITP Comments:     ITP Comments      10/12/15 0803           ITP Comments Medical Director-Dr. Fransico Him, MD          Comments:  Pt in for cardiac rehab orientation today 10/12/15.  Pt completed 6 minute walk test.  Monitor showed SR and this was compared to the most recent 12 lead ekg on 09/04/15. Pt tolerated well with no complaints. Pt is eager to get started in the exercise program. Maurice Small RN, BSN

## 2015-10-12 NOTE — Progress Notes (Signed)
Cardiac Rehab Medication Review by a Pharmacist  Does the patient feel that his/her medications are working for him/her?  yes  Has the patient been experiencing any side effects to the medications prescribed?  no  Does the patient measure his/her own blood pressure or blood glucose at home?  no  Patient has the BP machine at home but often doesn't use it  Does the patient have any problems obtaining medications due to transportation or finances?   no  Understanding of regimen: fair Understanding of indications: good Potential of compliance: good    Pharmacist comments: Mr. Luong wants to do everything he can to prevent another CV event. He did report sometimes taking 2 Plavix at one time instead of 1 because he wants to prevent the stent from clotting. I educated him that taking more than 1 increases his risk of bleeding and he should only take 1 tablet daily. He does not report any side effects.  Joya San, PharmD Clinical Pharmacy Resident Pager # [phone] 10/12/2015 8:26 AM

## 2015-10-16 ENCOUNTER — Encounter (HOSPITAL_COMMUNITY): Payer: Self-pay

## 2015-10-16 ENCOUNTER — Encounter (HOSPITAL_COMMUNITY)
Admission: RE | Admit: 2015-10-16 | Discharge: 2015-10-16 | Disposition: A | Discharge status: Home / Self Care | Payer: Federal, State, Local not specified - PPO | Source: Ambulatory Visit | Attending: Cardiology | Admitting: Cardiology

## 2015-10-16 DIAGNOSIS — Z951 Presence of aortocoronary bypass graft: Secondary | ICD-10-CM | POA: Diagnosis not present

## 2015-10-16 NOTE — Progress Notes (Signed)
Daily Session Note  Patient Details  Name: Jeffrey Holmes MRN: [id number] Date of Birth: [birth year] Referring Provider:  Nanci Pina, FNP  Encounter Date: 10/16/2015  Check In:     Session Check In - 10/16/15 0900    Check-In   Location [facility]   Staff Present Dorna Bloom, MS, ACSM RCEP, Exercise Physiologist;Jessica [place], MA, ACSM RCEP, Exercise Physiologist;Joann Rion, RN, Marga Melnick, RN, BSN   Supervising physician immediately available to respond to emergencies [facility] immediately available   Physician(s) Dr. Judson Roch   Medication changes reported     No   Fall or balance concerns reported    No   Warm-up and Cool-down Performed as group-led instruction   Resistance Training Performed Yes   VAD Patient? No   Pain Assessment   Currently in Pain? No/denies   Multiple Pain Sites No      Capillary Blood Glucose: No results found for this or any previous visit (from the past 24 hour(s)).   Goals Met:  Exercise tolerated well  Goals Unmet:  Not Applicable  Comments: Pt started cardiac rehab today.  Pt tolerated light exercise without difficulty. VSS, telemetry-sinus rhythm, asymptomatic.  Medication list reconciled. Pt denies barriers to medication compliance, however pt does have misconceptions about medication dosage and usage.  Pt previously took additional plavix dose to decrease likelihood of stent restonsis.  Currently pt reports he has discontinued amlodipine when he increased lopressor to 56m BID.  No order located in chart to d/c amlodipine.  Message sent to Dr. TRadford Paxto confirm.    PSYCHOSOCIAL ASSESSMENT:  PHQ-0.  Pt is concerned about coronary disease diagnosis and is eager to learn ways to decrease his CAD risk factors.   Pt exhibits positive coping skills, hopeful outlook with supportive family. No psychosocial needs identified at this time, no psychosocial interventions necessary.    Pt enjoys playing tennis, walking, pt  states "I have been active all my life".   Pt reports gratitude for opportunity to participate in cardiac rehab.   Pt oriented to exercise equipment and routine.   Pt encouraged to participate in lifestyle modification education classes.  Understanding verbalized.   Dr. TFransico Himis [facility] Director [facility]

## 2015-10-18 ENCOUNTER — Encounter (HOSPITAL_COMMUNITY)
Admission: RE | Admit: 2015-10-18 | Discharge: 2015-10-18 | Disposition: A | Discharge status: Home / Self Care | Payer: Federal, State, Local not specified - PPO | Source: Ambulatory Visit | Attending: Cardiology | Admitting: Cardiology

## 2015-10-18 DIAGNOSIS — Z951 Presence of aortocoronary bypass graft: Secondary | ICD-10-CM

## 2015-10-20 ENCOUNTER — Telehealth (HOSPITAL_COMMUNITY): Payer: Self-pay | Admitting: Cardiac Rehabilitation

## 2015-10-20 ENCOUNTER — Encounter (HOSPITAL_COMMUNITY)
Admission: RE | Admit: 2015-10-20 | Discharge: 2015-10-20 | Disposition: A | Discharge status: Home / Self Care | Payer: Federal, State, Local not specified - PPO | Source: Ambulatory Visit | Attending: Cardiology | Admitting: Cardiology

## 2015-10-20 DIAGNOSIS — Z951 Presence of aortocoronary bypass graft: Secondary | ICD-10-CM

## 2015-10-20 NOTE — Telephone Encounter (Signed)
-----   Message from Sueanne Margarita, MD sent at 10/19/2015  9:01 AM EDT ----- Regarding: RE: cardiac rehab No continue current meds for now ----- Message -----    From: Lowell Guitar, RN    Sent: 10/16/2015  10:12 AM      To: Sueanne Margarita, MD Subject: cardiac rehab                                  Dear Dr. Radford Pax,   Pt started cardiac rehab today.  He reports he is no longer taking amlodipine since he increased lopressor 50mg  BID.  His BP was 124/80 at rest, peak with exercise 150/80. Would you like him to resume amlodipine?  I don't see an order where it was discontinued.   Thank you, Andi Hence, RN, BSN Cardiac Pulmonary Rehab

## 2015-10-23 ENCOUNTER — Encounter (HOSPITAL_COMMUNITY)
Admission: RE | Admit: 2015-10-23 | Discharge: 2015-10-23 | Disposition: A | Discharge status: Home / Self Care | Payer: Federal, State, Local not specified - PPO | Source: Ambulatory Visit | Attending: Cardiology | Admitting: Cardiology

## 2015-10-23 DIAGNOSIS — Z951 Presence of aortocoronary bypass graft: Secondary | ICD-10-CM | POA: Diagnosis not present

## 2015-10-23 NOTE — Progress Notes (Signed)
Reviewed home exercise with pt today.  Pt plans to walk 1.5-2 miles 5x/week for exercise.  Reviewed THR, pulse, RPE, sign and symptoms, NTG use, and when to call 911 or MD.  Also discussed weather considerations and indoor options.  Pt voiced understanding.    Raneshia Derick [employer]

## 2015-10-24 NOTE — Progress Notes (Signed)
Cardiac Individual Treatment Plan  Patient Details  Name: Jeffrey Holmes MRN: [id number] Date of Birth: Apr 25, 1951 Referring Provider:  Nanci Pina, FNP  Initial Encounter Date:       CARDIAC REHAB PHASE II ORIENTATION from 10/12/2015 in [place]   Date  10/12/15      Visit Diagnosis: S/P CABG x 2  Patient's Home Medications on Admission:  Current outpatient prescriptions:  .  amLODipine (NORVASC) 10 MG tablet, Take 10 mg by mouth daily. Reported on 10/16/2015, Disp: , Rfl:  .  aspirin EC 81 MG tablet, Take 1 tablet (81 mg total) by mouth daily., Disp: , Rfl:  .  atorvastatin (LIPITOR) 40 MG tablet, Take 1 tablet (40 mg total) by mouth daily., Disp: 30 tablet, Rfl: 11 .  clopidogrel (PLAVIX) 75 MG tablet, Take 1 tablet (75 mg total) by mouth daily., Disp: 30 tablet, Rfl: 3 .  metoprolol (LOPRESSOR) 50 MG tablet, Take 1 tablet (50 mg total) by mouth 2 (two) times daily. (Patient taking differently: Take 50 mg by mouth 2 (two) times daily. Pt takes 25mg  2 tabs BID), Disp: 180 tablet, Rfl: 3 .  nitroGLYCERIN (NITROSTAT) 0.4 MG SL tablet, Place 0.4 mg under the tongue as needed. Place 1 tablet under your tongue every 5 minutes as needed for chest pain for a max of 3 doses, Disp: , Rfl:  .  timolol (TIMOPTIC) 0.5 % ophthalmic solution, Place 1 drop into both eyes 2 (two) times daily. , Disp: , Rfl:  .  travoprost, benzalkonium, (TRAVATAN) 0.004 % ophthalmic solution, Place 1 drop into both eyes at bedtime., Disp: , Rfl:   Past Medical History: Past Medical History  Diagnosis Date  . Hypertension   . Hypercholesteremia   . Chronic kidney disease, stage 3     multiple kidney stones  . H/O hiatal hernia   . Benign essential HTN 08/08/2015  . Hyperlipidemia 08/08/2015  . History of acute renal failure 2015    secondary to stones    Tobacco Use: History  Smoking status  . Never Smoker   Smokeless tobacco  . Never Used    Labs:     Recent Review  Flowsheet Data    Labs for ITP Cardiac and Pulmonary Rehab Latest Ref Rng 08/21/2015 08/21/2015 08/22/2015 08/22/2015 09/19/2015   Cholestrol 125 - 200 mg/dL - - - - 227(H)   LDLCALC <130 mg/dL - - - - 150(H)   HDL >=40 mg/dL - - - - 34(L)   Trlycerides <150 mg/dL - - - - 217(H)   PHART 7.350 - 7.450 7.323(L) 7.349(L) 7.329(L) - -   PCO2ART 35.0 - 45.0 mmHg 41.4 36.3 37.7 - -   HCO3 20.0 - 24.0 mEq/L 21.5 20.0 20.0 - -   TCO2 0 - 100 mmol/L 23 21 21 22  -   ACIDBASEDEF 0.0 - 2.0 mmol/L 4.0(H) 5.0(H) 6.0(H) - -   O2SAT - 95.0 96.0 94.0 - -      Capillary Blood Glucose: Lab Results  Component Value Date   GLUCAP 128* 08/25/2015   GLUCAP 120* 08/25/2015   GLUCAP 111* 08/24/2015   GLUCAP 100* 08/24/2015   GLUCAP 111* 08/24/2015     Exercise Target Goals:    Exercise Program Goal: Individual exercise prescription set with THRR, safety & activity barriers. Participant demonstrates ability to understand and report RPE using BORG scale, to self-measure pulse accurately, and to acknowledge the importance of the exercise prescription.  Exercise Prescription Goal: Starting with  aerobic activity 30 plus minutes a day, 3 days per week for initial exercise prescription. Provide home exercise prescription and guidelines that participant acknowledges understanding prior to discharge.  Activity Barriers & Risk Stratification:     Activity Barriers & Cardiac Risk Stratification - 10/12/15 1549    Activity Barriers & Cardiac Risk Stratification   Activity Barriers None      6 Minute Walk:     6 Minute Walk      10/12/15 1034       6 Minute Walk   Phase Initial     Distance 1683 feet     Walk Time 6 minutes     MPH 3.19     METS 3.69     RPE 9     Perceived Dyspnea  0     VO2 Peak 12.93     Symptoms No     Resting HR 76 bpm     Resting BP 142/78 mmHg     Max Ex. HR 98 bpm     Max Ex. BP 136/84 mmHg     2 Minute Post BP 134/84 mmHg     Pre/Post BP   Baseline BP 142/78 mmHg      6 Minute BP 136/84 mmHg     Pre/Post BP? Yes        Initial Exercise Prescription:     Initial Exercise Prescription - 10/12/15 1200    Date of Initial Exercise Prescription   Date 10/12/15   Treadmill   MPH 3   Grade 1   Minutes 10   METs 3.69   Bike   Level 1.2   Watts 60   Minutes 10   METs 3.69   NuStep   Level 3   Watts 60   Minutes 10   METs 3.69   Prescription Details   Frequency (times per week) 3   Duration Progress to 30 minutes of continuous aerobic without signs/symptoms of physical distress   Intensity   THRR 40-80% of Max Heartrate 62-123   Ratings of Perceived Exertion 11-13   Perceived Dyspnea 0-4   Progression   Progression Continue to progress workloads to maintain intensity without signs/symptoms of physical distress.   Resistance Training   Training Prescription No      Perform Capillary Blood Glucose checks as needed.  Exercise Prescription Changes:      Exercise Prescription Changes      10/24/15 1200           Exercise Review   Progression Yes       Response to Exercise   Blood Pressure (Admit) 142/82 mmHg       Blood Pressure (Exercise) 164/80 mmHg       Blood Pressure (Exit) 148/86 mmHg       Heart Rate (Admit) 88 bpm       Heart Rate (Exercise) 111 bpm       Heart Rate (Exit) 83 bpm       Rating of Perceived Exertion (Exercise) 11       Comments Home Exercise Given on 10/23/15       Duration Progress to 30 minutes of continuous aerobic without signs/symptoms of physical distress       Intensity THRR unchanged       Progression   Progression Continue to progress workloads to maintain intensity without signs/symptoms of physical distress.       Average METs 3.1       Resistance Training   Training  Prescription Yes       Weight 4 lbs       Reps 10-12       Interval Training   Interval Training No       Treadmill   MPH 3       Grade 1       Minutes 10       METs 3.7       Bike   Level 1.2       Minutes 10        METs 3.71       NuStep   Level 2       Minutes 10       METs 3.4       Home Exercise Plan   Plans to continue exercise at Home  walking       Frequency Add 2 additional days to program exercise sessions.          Exercise Comments:      Exercise Comments      10/24/15 1233           Exercise Comments Pt is tolerating exercise well.  Will continue to monitor for exercise progression.          Discharge Exercise Prescription (Final Exercise Prescription Changes):     Exercise Prescription Changes - 10/24/15 1200    Exercise Review   Progression Yes   Response to Exercise   Blood Pressure (Admit) 142/82 mmHg   Blood Pressure (Exercise) 164/80 mmHg   Blood Pressure (Exit) 148/86 mmHg   Heart Rate (Admit) 88 bpm   Heart Rate (Exercise) 111 bpm   Heart Rate (Exit) 83 bpm   Rating of Perceived Exertion (Exercise) 11   Comments Home Exercise Given on 10/23/15   Duration Progress to 30 minutes of continuous aerobic without signs/symptoms of physical distress   Intensity THRR unchanged   Progression   Progression Continue to progress workloads to maintain intensity without signs/symptoms of physical distress.   Average METs 3.1   Resistance Training   Training Prescription Yes   Weight 4 lbs   Reps 10-12   Interval Training   Interval Training No   Treadmill   MPH 3   Grade 1   Minutes 10   METs 3.7   Bike   Level 1.2   Minutes 10   METs 3.71   NuStep   Level 2   Minutes 10   METs 3.4   Home Exercise Plan   Plans to continue exercise at Home  walking   Frequency Add 2 additional days to program exercise sessions.      Nutrition:  Target Goals: Understanding of nutrition guidelines, daily intake of sodium 1500mg , cholesterol 200mg , calories 30% from fat and 7% or less from saturated fats, daily to have 5 or more servings of fruits and vegetables.  Biometrics:     Pre Biometrics - 10/12/15 1210    Pre Biometrics   Height 5' 6.25" (1.683 m)    Weight 182 lb 1.6 oz (82.6 kg)   Waist Circumference 39 inches   Hip Circumference 39 inches   Waist to Hip Ratio 1 %   BMI (Calculated) 29.2   Triceps Skinfold 13 mm   % Body Fat 27.2 %   Grip Strength 46 kg   Flexibility 7.5 in   Single Leg Stand 7.3 seconds       Nutrition Therapy Plan and Nutrition Goals:     Nutrition Therapy & Goals -  10/12/15 1521    Nutrition Therapy   Diet Therapeutic Lifestyle Changes, Consistent CHO   Personal Nutrition Goals   Personal Goal #1 6-20 lb wt loss at graduation from Cardiac Rehab   Personal Goal #2 Basic understanding of diabetic diet principles (A1c 6.9)   Intervention Plan   Intervention Prescribe, educate and counsel regarding individualized specific dietary modifications aiming towards targeted core components such as weight, hypertension, lipid management, diabetes, heart failure and other comorbidities.   Expected Outcomes Short Term Goal: Understand basic principles of dietary content, such as calories, fat, sodium, cholesterol and nutrients.;Long Term Goal: Adherence to prescribed nutrition plan.      Nutrition Discharge: Nutrition Scores:     Nutrition Assessments - 10/25/15 1047    MEDFICTS Scores   Pre Score 48      Nutrition Goals Re-Evaluation:     Nutrition Goals Re-Evaluation      10/25/15 1022           Personal Goal #1 Re-Evaluation   Personal Goal #1 6-20 lb wt loss at graduation from Cardiac Rehab       Goal Progress Seen No       Comments Pt knows he needs to limit sugary beverages (soda, lemonade, sweet tea) to help promote wt loss.       Personal Goal #2 Re-Evaluation   Personal Goal #2 Basic understanding of diabetic diet principles (A1c 6.9)       Goal Progress Seen Yes       Comments Pt aware of the need to limit sugary beverages to help with blood sugar control. Pt in the contemplative state of change.        Intervention Plan   Intervention Continue to educate, counsel and set short/long term  goals regarding individualized specific personal dietary modifications.;Nutrition handout(s) given to patient.       Comments Type 2 DM handout provided for pt to review          Psychosocial: Target Goals: Acknowledge presence or absence of depression, maximize coping skills, provide positive support system. Participant is able to verbalize types and ability to use techniques and skills needed for reducing stress and depression.  Initial Review & Psychosocial Screening:     Initial Psych Review & Screening - 10/16/15 [place]? Yes   Barriers   Psychosocial barriers to participate in program There are no identifiable barriers or psychosocial needs.      Quality of Life Scores:     Quality of Life - 10/25/15 1044    Quality of Life Scores   GLOBAL Post --  quality of life scores reviewed with patient.  pt demononstrates good coping skills with positive outlook and family support.        PHQ-9:     Recent Review Flowsheet Data    Depression screen [facility] 2/9 10/16/2015   Decreased Interest 0   Down, Depressed, Hopeless 0   PHQ - 2 Score 0      Psychosocial Evaluation and Intervention:   Psychosocial Re-Evaluation:     Psychosocial Re-Evaluation      10/24/15 1641           Psychosocial Re-Evaluation   Interventions Stress management education;Relaxation education;Encouraged to attend Cardiac Rehabilitation for the exercise       Continued Psychosocial Services Needed Yes          Vocational Rehabilitation: Provide vocational rehab assistance to qualifying candidates.   Vocational Rehab  Evaluation & Intervention:     Vocational Rehab - 10/18/15 0912    Initial Vocational Rehab Evaluation & Intervention   Assessment shows need for Vocational Rehabilitation No  pt inadvertantly checked yes on voc rehab form.  pt is retired.      Education: Education Goals: Education classes will be provided on a weekly basis, covering  required topics. Participant will state understanding/return demonstration of topics presented.  Learning Barriers/Preferences:     Learning Barriers/Preferences - 10/12/15 [id number]    Learning Barriers/Preferences   Learning Barriers Sight  reading glasses   Learning Preferences Group Instruction;Individual Instruction;Verbal Instruction;Written Material      Education Topics: Count Your Pulse:  -Group instruction provided by verbal instruction, demonstration, patient participation and written materials to support subject.  Instructors address importance of being able to find your pulse and how to count your pulse when at home without a heart monitor.  Patients get hands on experience counting their pulse with staff help and individually.   Heart Attack, Angina, and Risk Factor Modification:  -Group instruction provided by verbal instruction, video, and written materials to support subject.  Instructors address signs and symptoms of angina and heart attacks.    Also discuss risk factors for heart disease and how to make changes to improve heart health risk factors.   Functional Fitness:  -Group instruction provided by verbal instruction, demonstration, patient participation, and written materials to support subject.  Instructors address safety measures for doing things around the house.  Discuss how to get up and down off the floor, how to pick things up properly, how to safely get out of a chair without assistance, and balance training.      CARDIAC REHAB PHASE II EXERCISE from 10/20/2015 in [place]   Date  10/20/15   Instruction Review Code  2- meets goals/outcomes      Meditation and Mindfulness:  -Group instruction provided by verbal instruction, patient participation, and written materials to support subject.  Instructor addresses importance of mindfulness and meditation practice to help reduce stress and improve awareness.  Instructor also leads  participants through a meditation exercise.    Stretching for Flexibility and Mobility:  -Group instruction provided by verbal instruction, patient participation, and written materials to support subject.  Instructors lead participants through series of stretches that are designed to increase flexibility thus improving mobility.  These stretches are additional exercise for major muscle groups that are typically performed during regular warm up and cool down.   Hands Only CPR Anytime:  -Group instruction provided by verbal instruction, video, patient participation and written materials to support subject.  Instructors co-teach with AHA video for hands only CPR.  Participants get hands on experience with mannequins.   Nutrition I class: Heart Healthy Eating:  -Group instruction provided by PowerPoint slides, verbal discussion, and written materials to support subject matter. The instructor gives an explanation and review of the Therapeutic Lifestyle Changes diet recommendations, which includes a discussion on lipid goals, dietary fat, sodium, fiber, plant stanol/sterol esters, sugar, and the components of a well-balanced, healthy diet.   Nutrition II class: Lifestyle Skills:  -Group instruction provided by PowerPoint slides, verbal discussion, and written materials to support subject matter. The instructor gives an explanation and review of label reading, grocery shopping for heart health, heart healthy recipe modifications, and ways to make healthier choices when eating out.   Diabetes Question & Answer:  -Group instruction provided by PowerPoint slides, verbal discussion, and written materials to  support subject matter. The instructor gives an explanation and review of diabetes co-morbidities, pre- and post-prandial blood glucose goals, pre-exercise blood glucose goals, signs, symptoms, and treatment of hypoglycemia and hyperglycemia, and foot care basics.   Diabetes Blitz:  -Group  instruction provided by PowerPoint slides, verbal discussion, and written materials to support subject matter. The instructor gives an explanation and review of the physiology behind type 1 and type 2 diabetes, diabetes medications and rational behind using different medications, pre- and post-prandial blood glucose recommendations and Hemoglobin A1c goals, diabetes diet, and exercise including blood glucose guidelines for exercising safely.    Portion Distortion:  -Group instruction provided by PowerPoint slides, verbal discussion, written materials, and food models to support subject matter. The instructor gives an explanation of serving size versus portion size, changes in portions sizes over the last 20 years, and what consists of a serving from each food group.   Stress Management:  -Group instruction provided by verbal instruction, video, and written materials to support subject matter.  Instructors review role of stress in heart disease and how to cope with stress positively.     Exercising on Your Own:  -Group instruction provided by verbal instruction, power point, and written materials to support subject.  Instructors discuss benefits of exercise, components of exercise, frequency and intensity of exercise, and end points for exercise.  Also discuss use of nitroglycerin and activating EMS.  Review options of places to exercise outside of rehab.  Review guidelines for sex with heart disease.   Cardiac Drugs I:  -Group instruction provided by verbal instruction and written materials to support subject.  Instructor reviews cardiac drug classes: antiplatelets, anticoagulants, beta blockers, and statins.  Instructor discusses reasons, side effects, and lifestyle considerations for each drug class.   Cardiac Drugs II:  -Group instruction provided by verbal instruction and written materials to support subject.  Instructor reviews cardiac drug classes: angiotensin converting enzyme inhibitors  (ACE-I), angiotensin II receptor blockers (ARBs), nitrates, and calcium channel blockers.  Instructor discusses reasons, side effects, and lifestyle considerations for each drug class.          CARDIAC REHAB PHASE II EXERCISE from 10/20/2015 in [place]   Date  10/18/15   Educator  Clydia Llano, Pharm D   Instruction Review Code  2- meets goals/outcomes      Anatomy and Physiology of the Circulatory System:  -Group instruction provided by verbal instruction, video, and written materials to support subject.  Reviews functional anatomy of heart, how it relates to various diagnoses, and what role the heart plays in the overall system.   Knowledge Questionnaire Score:     Knowledge Questionnaire Score - 10/18/15 0811    Knowledge Questionnaire Score   Pre Score 88      Core Components/Risk Factors/Patient Goals at Admission:     Personal Goals and Risk Factors at Admission - 10/25/15 1029    Core Components/Risk Factors/Patient Goals on Admission    Weight Management Yes;Weight Loss   Intervention Weight Management: Develop a combined nutrition and exercise program designed to reach desired caloric intake, while maintaining appropriate intake of nutrient and fiber, sodium and fats, and appropriate energy expenditure required for the weight goal.;Weight Management: Provide education and appropriate resources to help participant work on and attain dietary goals.   Admit Weight 182 lb 1.6 oz (82.6 kg)   Goal Weight: Short Term 175 lb (79.379 kg)   Goal Weight: Long Term 165 lb (74.844 kg)  Expected Outcomes Short Term: Continue to assess and modify interventions until short term weight is achieved;Weight Loss: Understanding of general recommendations for a balanced deficit meal plan, which promotes 1-2 lb weight loss per week and includes a negative energy balance of [phone] kcal/d;Long Term: Adherence to nutrition and physical activity/exercise program  aimed toward attainment of established weight goal   Diabetes --  A1c 6.9. No DM dx noted   Hypertension Yes   Intervention Provide education on lifestyle modifcations including regular physical activity/exercise, weight management, moderate sodium restriction and increased consumption of fresh fruit, vegetables, and low fat dairy, alcohol moderation, and smoking cessation.;Monitor prescription use compliance.   Expected Outcomes Short Term: Continued assessment and intervention until BP is < 140/26mm HG in hypertensive participants. < 130/62mm HG in hypertensive participants with diabetes, heart failure or chronic kidney disease.;Long Term: Maintenance of blood pressure at goal levels.   Lipids Yes   Intervention Provide education and support for participant on nutrition & aerobic/resistive exercise along with prescribed medications to achieve LDL 70mg , HDL >40mg .   Expected Outcomes Short Term: Participant states understanding of desired cholesterol values and is compliant with medications prescribed. Participant is following exercise prescription and nutrition guidelines.;Long Term: Cholesterol controlled with medications as prescribed, with individualized exercise RX and with personalized nutrition plan. Value goals: LDL < 70mg , HDL > 40 mg.   Stress Yes   Intervention Offer individual and/or small group education and counseling on adjustment to heart disease, stress management and health-related lifestyle change. Teach and support self-help strategies.;Refer participants experiencing significant psychosocial distress to appropriate mental health specialists for further evaluation and treatment. When possible, include family members and significant others in education/counseling sessions.   Expected Outcomes Short Term: Participant demonstrates changes in health-related behavior, relaxation and other stress management skills, ability to obtain effective social support, and compliance with  psychotropic medications if prescribed.;Long Term: Emotional wellbeing is indicated by absence of clinically significant psychosocial distress or social isolation.   Personal Goal Other Yes   Personal Goal Back to tennis (increase stamina)   Intervention Provide exercise prescription and education for improving functional capacity   Expected Outcomes Able to exercise independently with confidence on knowing limits      Core Components/Risk Factors/Patient Goals Review:    Core Components/Risk Factors/Patient Goals at Discharge (Final Review):    ITP Comments:     ITP Comments      10/12/15 0803           ITP Comments Medical Director-Dr. Fransico Him, MD          Comments:30 DAY ITP REVIEW Pt is making expected progress toward personal goals after completing 6 sessions.   Recommend continued exercise and life style modification education including  stress management and relaxation techniques to decrease cardiac risk profile. Pt will need continued support with control of hypertension.

## 2015-10-25 ENCOUNTER — Encounter (HOSPITAL_COMMUNITY)
Admission: RE | Admit: 2015-10-25 | Discharge: 2015-10-25 | Disposition: A | Discharge status: Home / Self Care | Payer: Federal, State, Local not specified - PPO | Source: Ambulatory Visit | Attending: Cardiology | Admitting: Cardiology

## 2015-10-25 DIAGNOSIS — Z951 Presence of aortocoronary bypass graft: Secondary | ICD-10-CM | POA: Diagnosis not present

## 2015-10-25 NOTE — Progress Notes (Addendum)
Jeffrey Holmes 65 y.o. male Nutrition Note Spoke with pt. Nutrition Plan and Nutrition Survey goals reviewed with pt. Pt is following Step 1 of the Therapeutic Lifestyle Changes diet. Pt states he has made "a lot of diet changes." Pt wants to lose wt. Pt is in the contemplative state of change re: weight loss. Pt barrier to wt loss include pt does not each lunch and pt consumes sugary drinks (e.g. Soda, lemonade, sweet tea). Wt loss tips briefly reviewed. The effect of high fructose corn syrup in soda and how corn syrup effects TG discussed. No dx of diabetes noted. Pt is aware that he is "pre-diabetic and I need to watch my sugar intake." Pt's elevated A1c discussed with pt. Pt encouraged to discuss further DM testing with his PCP. Pt expressed understanding of the information reviewed. Pt aware of nutrition education classes offered and plans on attending nutrition classes.  Lab Results  Component Value Date   HGBA1C 6.9* 08/19/2015   Wt Readings from Last 3 Encounters:  10/12/15 182 lb 1.6 oz (82.6 kg)  09/25/15 175 lb (79.379 kg)  09/04/15 175 lb (79.379 kg)    Nutrition Diagnosis ? Food-and nutrition-related knowledge deficit related to lack of exposure to information as related to diagnosis of: ? CVD ? Hyperglycemia ? Overweight related to excessive energy intake as evidenced by a BMI of 29.2  Nutrition RX/ Estimated Daily Nutrition Needs for: wt loss 1350-1850 Kcal, 35-50 gm fat, 8-12 gm sat fat, 1.3-1.8 gm trans-fat, <1500 mg sodium, 175-250 gm CHO   Nutrition Intervention ? Pt's individual nutrition plan reviewed with pt. ? Benefits of adopting Therapeutic Lifestyle Changes discussed when Medficts reviewed. ? Pt to attend the Portion Distortion class ? Pt to attend the Diabetes Q & A class ? Pt to attend the   ? Nutrition I class                  ? Nutrition II class ? Pt given handouts for: ? Nutrition I class ? Nutrition II class ?  Type 2 Diabetes  ? 1500 kcal, 5-day menu  ideas ? Continue client-centered nutrition education by RD, as part of interdisciplinary care. Goal(s) ? Pt to identify and limit food sources of saturated fat, trans fat, and sodium ? Pt to identify food quantities necessary to achieve weight loss of 6-20 lb at graduation from cardiac rehab.  ? Pt able to name foods that affect blood glucose  Monitor and Evaluate progress toward nutrition goal with team. Derek Mound, M.Ed, RD, LDN, CDE 10/25/2015 10:39 AM

## 2015-10-27 ENCOUNTER — Encounter (HOSPITAL_COMMUNITY)
Admission: RE | Admit: 2015-10-27 | Discharge: 2015-10-27 | Disposition: A | Discharge status: Home / Self Care | Payer: Federal, State, Local not specified - PPO | Source: Ambulatory Visit | Attending: Cardiology | Admitting: Cardiology

## 2015-10-27 DIAGNOSIS — Z951 Presence of aortocoronary bypass graft: Secondary | ICD-10-CM

## 2015-10-30 ENCOUNTER — Encounter (HOSPITAL_COMMUNITY)
Admission: RE | Admit: 2015-10-30 | Discharge: 2015-10-30 | Disposition: A | Discharge status: Home / Self Care | Payer: Medicare Other | Source: Ambulatory Visit | Attending: Cardiology | Admitting: Cardiology

## 2015-10-30 DIAGNOSIS — Z951 Presence of aortocoronary bypass graft: Secondary | ICD-10-CM | POA: Insufficient documentation

## 2015-10-31 ENCOUNTER — Ambulatory Visit: Payer: Medicare Other | Admitting: Cardiology

## 2015-11-01 ENCOUNTER — Encounter (HOSPITAL_COMMUNITY)
Admission: RE | Admit: 2015-11-01 | Discharge: 2015-11-01 | Disposition: A | Discharge status: Home / Self Care | Payer: Medicare Other | Source: Ambulatory Visit | Attending: Cardiology | Admitting: Cardiology

## 2015-11-01 DIAGNOSIS — Z951 Presence of aortocoronary bypass graft: Secondary | ICD-10-CM

## 2015-11-01 NOTE — Progress Notes (Signed)
Daily Session Note  Patient Details  Name: Jeffrey Holmes MRN: [id number] Date of Birth: 08/22/1950 Referring Provider:  Nanci Pina, FNP  Encounter Date: 11/01/2015  Check In:     Session Check In - 11/01/15 0905    Check-In   Staff Present Dorna Bloom, MS, ACSM RCEP, Exercise Physiologist;Jessica [place], MA, ACSM RCEP, Exercise Physiologist;Juwan Vences, RN, Marga Melnick, RN, BSN   Supervising physician immediately available to respond to emergencies [facility] immediately available   Physician(s) Dr. Marily Memos   Medication changes reported     No   Fall or balance concerns reported    No   Warm-up and Cool-down Performed as group-led instruction   Resistance Training Performed No   VAD Patient? No   Pain Assessment   Currently in Pain? No/denies   Multiple Pain Sites No      Capillary Blood Glucose: No results found for this or any previous visit (from the past 24 hour(s)).   Goals Met:  Independence with exercise equipment Exercise tolerated well  Goals Unmet:  N/A  Comments: pt reports episodes of chest heaviness at home at rest and with exercise. Pt states heaviness comes and goes. Pt also states symptoms have been present since CABG, however not typical for anginal equivalent pre CABG.   On Monday, his most recent episode,  symptoms were relieved with walking at home.  Pt denies discomfort today or at cardiac rehab. Unfortunately, pt arbitrarily missed some plavix doses when his prescription ran out a month ago, however refill was called in and pt has not missed further doses.  Appointment scheduled  for pt  to be seen by  Dr. Radford Pax 11/03/15 _0 :15.     Dr. Fransico Him is Medical Director for Cardiac Rehab at [facility].

## 2015-11-03 ENCOUNTER — Encounter (HOSPITAL_COMMUNITY)
Admission: RE | Admit: 2015-11-03 | Discharge: 2015-11-03 | Disposition: A | Discharge status: Home / Self Care | Payer: Medicare Other | Source: Ambulatory Visit | Attending: Cardiology | Admitting: Cardiology

## 2015-11-03 ENCOUNTER — Encounter: Payer: Self-pay | Admitting: Cardiology

## 2015-11-03 ENCOUNTER — Ambulatory Visit (INDEPENDENT_AMBULATORY_CARE_PROVIDER_SITE_OTHER): Payer: Medicare Other | Admitting: Cardiology

## 2015-11-03 VITALS — BP 144/84 | HR 60 | Ht 65.0 in | Wt 185.1 lb

## 2015-11-03 DIAGNOSIS — I2510 Atherosclerotic heart disease of native coronary artery without angina pectoris: Secondary | ICD-10-CM | POA: Diagnosis not present

## 2015-11-03 DIAGNOSIS — Z951 Presence of aortocoronary bypass graft: Secondary | ICD-10-CM

## 2015-11-03 DIAGNOSIS — I10 Essential (primary) hypertension: Secondary | ICD-10-CM | POA: Diagnosis not present

## 2015-11-03 DIAGNOSIS — E785 Hyperlipidemia, unspecified: Secondary | ICD-10-CM | POA: Diagnosis not present

## 2015-11-03 MED ORDER — AMLODIPINE BESYLATE 2.5 MG PO TABS: 2.5000 mg | ORAL_TABLET | Freq: Every day | ORAL | Status: DC

## 2015-11-03 NOTE — Patient Instructions (Signed)
Medication Instructions:  1) START Amlodipine 2.5mg  once daily  Labwork: Lipids and LFTs today  Testing/Procedures: None  Follow-Up: Your physician recommends that you schedule a follow-up appointment in: 2 weeks in our hypertension clinic.  Your physician wants you to follow-up in: 6 months with Dr. Radford Pax. You will receive a reminder letter in the mail two months in advance. If you don't receive a letter, please call our office to schedule the follow-up appointment.    Any Other Special Instructions Will Be Listed Below (If Applicable).     If you need a refill on your cardiac medications before your next appointment, please call your pharmacy.

## 2015-11-03 NOTE — Progress Notes (Signed)
Cardiology Office Note    Date:  11/03/2015   ID:  Jeffrey Holmes, DOB Jul 14, 1951, MRN [id number]  PCP:  Nanci Pina, FNP  Cardiologist:  Sueanne Margarita, MD   Chief Complaint  Patient presents with  . Coronary Artery Disease  . Hypertension    History of Present Illness:  Jeffrey Holmes is a 65 y.o. male who recently had evaluation of CP with cath and was found to have severe multivessel coronary artery disease. After full discussion with cardiothoracic surgery it was felt he would best be served with a hybrid procedure with coronary bypassing to the LAD and diagonal coronary artery as well as a stent to the right coronary artery system. He underwent this on [id number] with no complications.  He is now in Cardiac Rehab.     He presents back today for followup and is doing well.  . Pt is having no complaints except for some discomfort in his chest to the left and right of his sternum.  This is accentuated by certain movements and sleeping. He is now eating heart healthy diet. He denies any anginal chest pain, SOB, LE edema, dizziness, palpitations or syncope.  Occasionally he will get a little "short winded" with some activities.      Past Medical History  Diagnosis Date  . Chronic kidney disease, stage 3     multiple kidney stones  . H/O hiatal hernia   . Benign essential HTN 08/08/2015  . Hyperlipidemia 08/08/2015  . History of acute renal failure 2015    secondary to stones  . CAD (coronary artery disease), native coronary artery 08/28/2015    Severe multivessel ASCAD s/p CABG hybrid with LIMA to LAD and diag and PCI of the RCA.     Past Surgical History  Procedure Laterality Date  . Back surgery  2006    microdisectomy  . Lithotripsy      2-3 times in past  . Cystoscopy with retrograde pyelogram, ureteroscopy and stent placement Right 01/06/2014    Procedure: CYSTOSCOPY WITH RETROGRADE PYELOGRAM, URETEROSCOPY AND STENT PLACEMENT;  Surgeon: Bernestine Amass, MD;  Location: [facility];  Service: Urology;  Laterality: Right;  . Cardiovascular stress test  08/15/2015  . Cardiac catheterization N/A 08/18/2015    Procedure: Left Heart Cath and Coronary Angiography;  Surgeon: Jettie Booze, MD;  Location: [facility];  Service: Cardiovascular;  Laterality: N/A;  . Tee without cardioversion N/A 08/21/2015    Procedure: TRANSESOPHAGEAL ECHOCARDIOGRAM (TEE);  Surgeon: [name], MD;  Location: [facility];  Service: Open Heart Surgery;  Laterality: N/A;  . Coronary artery bypass graft N/A 08/21/2015    Procedure: OFF PUMP CORONARY ARTERY BYPASS GRAFTING (CABG), TIMES TWO, USING LEFT INTERNAL MAMMARY ARTERY, RIGHT GREATER SAPHENOUS VEIN HARVESTED ENDOSCOPICALLY;  Surgeon: [name], MD;  Location: [facility];  Service: Open Heart Surgery;  Laterality: N/A;  . Angioplasty N/A 08/21/2015    Procedure: ANGIOPLASTY WITH PERCUTANEOUS CORONARY INTERVENTION WITH DES TO RIGHT CORONARY ARTERY;  Surgeon: Jettie Booze, MD;  Location: [facility];  Service: Cardiovascular;  Laterality: N/A;  . Cardiac catheterization N/A 08/21/2015    Procedure: Coronary Stent Intervention;  Surgeon: Jettie Booze, MD;  Location: [facility];  Service: Cardiovascular;  Laterality: N/A;  . Cardiac catheterization  08/21/2015    Procedure: Bypass Graft Angiography;  Surgeon: Jettie Booze, MD;  Location: [facility];  Service: Cardiovascular;;    Current Medications: Outpatient Prescriptions Prior  to Visit  Medication Sig Dispense Refill  . aspirin EC 81 MG tablet Take 1 tablet (81 mg total) by mouth daily.    [name] atorvastatin (LIPITOR) 40 MG tablet Take 1 tablet (40 mg total) by mouth daily. 30 tablet 11  . clopidogrel (PLAVIX) 75 MG tablet Take 1 tablet (75 mg total) by mouth daily. 30 tablet 3  . metoprolol (LOPRESSOR) 50 MG tablet Take 1 tablet (50 mg total) by mouth 2 (two) times daily. (Patient taking differently: Take 50 mg by mouth 2 (two) times daily. Pt takes 25mg   2 tabs BID) 180 tablet 3  . nitroGLYCERIN (NITROSTAT) 0.4 MG SL tablet Place 0.4 mg under the tongue as needed. Place 1 tablet under your tongue every 5 minutes as needed for chest pain for a max of 3 doses    . timolol (TIMOPTIC) 0.5 % ophthalmic solution Place 1 drop into both eyes 2 (two) times daily.     . travoprost, benzalkonium, (TRAVATAN) 0.004 % ophthalmic solution Place 1 drop into both eyes at bedtime.    [name] amLODipine (NORVASC) 10 MG tablet Take 10 mg by mouth daily. Reported on 10/16/2015     No facility-administered medications prior to visit.     Allergies:   Vicodin; Hydrocodone; Oxycontin; and Percocet   Social History   Social History  . Marital Status: Married    Spouse Name: N/A  . Number of Children: N/A  . Years of Education: N/A   Social History Main Topics  . Smoking status: Never Smoker   . Smokeless tobacco: Never Used  . Alcohol Use: Yes     Comment: occasional every 2-3 months  . Drug Use: No  . Sexual Activity: Not Asked   Other Topics Concern  . None   Social History Narrative     Family History:  The patient's family history includes Heart attack in his mother; Kidney failure in his mother; Throat cancer in his father.   ROS:   Please see the history of present illness.    ROS All other systems reviewed and are negative.   PHYSICAL EXAM:   VS:  BP 144/84 mmHg  Pulse 60  Ht 5\' 5"  (1.651 m)  Wt 185 lb 1.9 oz (83.97 kg)  BMI 30.81 kg/m2   GEN: Well nourished, well developed, in no acute distress HEENT: normal Neck: no JVD, carotid bruits, or masses Cardiac: RRR; no murmurs, rubs, or gallops,no edema.  Intact distal pulses bilaterally.  Respiratory:  clear to auscultation bilaterally, normal work of breathing GI: soft, nontender, nondistended, + BS MS: no deformity or atrophy Skin: warm and dry, no rash Neuro:  Alert and Oriented x 3, Strength and sensation are intact Psych: euthymic mood, full affect  Wt Readings from Last 3  Encounters:  11/03/15 185 lb 1.9 oz (83.97 kg)  10/12/15 182 lb 1.6 oz (82.6 kg)  09/25/15 175 lb (79.379 kg)      Studies/Labs Reviewed:   EKG:  EKG is ordered today.  The ekg ordered today demonstrates NSR at 60 bpm with no ST changes  Recent Labs: 08/17/2015: TSH 3.479 08/22/2015: Magnesium 2.6* 09/04/2015: Hemoglobin 14.3; Platelets 519* 09/12/2015: BUN 17; Creat 1.75*; Potassium 5.8*; Sodium 140 09/19/2015: ALT 21   Lipid Panel    Component Value Date/Time   CHOL 227* 09/19/2015 0754   TRIG 217* 09/19/2015 0754   HDL 34* 09/19/2015 0754   CHOLHDL 6.7* 09/19/2015 0754   VLDL 43* 09/19/2015 0754   LDLCALC 150* 09/19/2015 0754  Additional studies/ records that were reviewed today include:  NONE    ASSESSMENT:    1. Coronary artery disease involving native coronary artery of native heart without angina pectoris   2. Benign essential HTN   3. Hyperlipidemia      PLAN:  In order of problems listed above:  1. ASCAD s/p hybrid CABG (LIMA to LAD, SVG to Diag) and PCI of RCA.  He is doing well with no angina.  He is participating in cardiac rehab.  Continue ASA/Plavix/statin/BB 2. HTN - borderline controlled on current regimen.  Continue BB.  He has had some mildly elevated BP readings in cardiac rehab so I will start him on amlodipine 2.5mg  daily.  Followup in HTN clinic in 2 weeks.  3. Dyslipidemia with LDL goal < 70.  Continue statin.  Recheck FLP and ALT.      Medication Adjustments/Labs and Tests Ordered: Current medicines are reviewed at length with the patient today.  Concerns regarding medicines are outlined above.  Medication changes, Labs and Tests ordered today are listed in the Patient Instructions below. There are no Patient Instructions on file for this visit.   Lurena Nida, MD  11/03/2015 1:48 PM    [facility] [address], [place], [place]  [address] Phone: [phone]; Fax: [phone]

## 2015-11-04 LAB — LIPID PANEL
CHOLESTEROL: 183 mg/dL (ref 125–200)
HDL: 41 mg/dL (ref >=40)
LDL Cholesterol: 94 mg/dL (ref <130)
TRIGLYCERIDES: 238 mg/dL — AB (ref <150)
Total CHOL/HDL Ratio: 4.5 Ratio (ref <=5.0)
VLDL: 48 mg/dL — AB (ref <30)

## 2015-11-04 LAB — HEPATIC FUNCTION PANEL
ALK PHOS: 87 U/L (ref 40–115)
ALT: 26 U/L (ref 9–46)
AST: 18 U/L (ref 10–35)
Albumin: 4.3 g/dL (ref 3.6–5.1)
BILIRUBIN DIRECT: 0.1 mg/dL (ref <=0.2)
BILIRUBIN INDIRECT: 0.5 mg/dL (ref 0.2–1.2)
TOTAL PROTEIN: 7.4 g/dL (ref 6.1–8.1)
Total Bilirubin: 0.6 mg/dL (ref 0.2–1.2)

## 2015-11-06 ENCOUNTER — Encounter (HOSPITAL_COMMUNITY)
Admission: RE | Admit: 2015-11-06 | Discharge: 2015-11-06 | Disposition: A | Discharge status: Home / Self Care | Payer: Medicare Other | Source: Ambulatory Visit | Attending: Cardiology | Admitting: Cardiology

## 2015-11-06 DIAGNOSIS — Z951 Presence of aortocoronary bypass graft: Secondary | ICD-10-CM | POA: Diagnosis not present

## 2015-11-06 NOTE — Progress Notes (Signed)
Daily Session Note  Patient Details  Name: Jeffrey Holmes MRN: [id number] Date of Birth: 04/22/1951 Referring Provider:  Nanci Pina, FNP  Encounter Date: 11/06/2015  Check In:     Session Check In - 11/06/15 0822    Check-In   Location [facility]   Staff Present Dorna Bloom, MS, ACSM RCEP, Exercise Physiologist;Jessica [place], MA, ACSM RCEP, Exercise Physiologist;Joann Rion, RN, BSN;Carlette Carlton, RN, BSN   Supervising physician immediately available to respond to emergencies [facility] immediately available   Physician(s) Dr. Marily Memos   Medication changes reported     Yes   Comments started amlodipine 2.73m, med list reconciled    Fall or balance concerns reported    No   Warm-up and Cool-down Performed as group-led instruction   Resistance Training Performed Yes   VAD Patient? No   Pain Assessment   Currently in Pain? No/denies      Capillary Blood Glucose: No results found for this or any previous visit (from the past 24 hour(s)).   Goals Met:  Independence with exercise equipment  Goals Unmet:  Not Applicable  Comments:  Pt arrived at cardiac rehab reporting addition of amlodipine per Dr. TRadford Pax Med list reconciled.    Dr. TFransico Himis Medical Director [facility]

## 2015-11-07 ENCOUNTER — Telehealth: Payer: Self-pay | Admitting: *Deleted

## 2015-11-07 DIAGNOSIS — E785 Hyperlipidemia, unspecified: Secondary | ICD-10-CM

## 2015-11-07 DIAGNOSIS — I10 Essential (primary) hypertension: Secondary | ICD-10-CM

## 2015-11-07 MED ORDER — ATORVASTATIN CALCIUM 80 MG PO TABS: 80.0000 mg | ORAL_TABLET | Freq: Every day | ORAL | Status: DC

## 2015-11-07 NOTE — Telephone Encounter (Signed)
Pt has been made aware of his lab results and the need to increase the Atorvastatin to 80 mg q daily and repeat ALT & FLP in 6 weeks (12/19/15) Pt verbalized understanding.  Order put in [facility]

## 2015-11-07 NOTE — Telephone Encounter (Signed)
-----   Message from Sueanne Margarita, MD sent at 11/04/2015  2:23 PM EDT ----- LDL not at goal - increase atorvastatin to 80mg  daily and repeat FLP and ALT in 6 weeks

## 2015-11-08 ENCOUNTER — Encounter (HOSPITAL_COMMUNITY)
Admission: RE | Admit: 2015-11-08 | Discharge: 2015-11-08 | Disposition: A | Discharge status: Home / Self Care | Payer: Medicare Other | Source: Ambulatory Visit | Attending: Cardiology | Admitting: Cardiology

## 2015-11-08 DIAGNOSIS — Z951 Presence of aortocoronary bypass graft: Secondary | ICD-10-CM | POA: Diagnosis not present

## 2015-11-10 ENCOUNTER — Encounter (HOSPITAL_COMMUNITY)
Admission: RE | Admit: 2015-11-10 | Discharge: 2015-11-10 | Disposition: A | Discharge status: Home / Self Care | Payer: Medicare Other | Source: Ambulatory Visit | Attending: Cardiology | Admitting: Cardiology

## 2015-11-10 DIAGNOSIS — Z951 Presence of aortocoronary bypass graft: Secondary | ICD-10-CM

## 2015-11-13 ENCOUNTER — Encounter (HOSPITAL_COMMUNITY)
Admission: RE | Admit: 2015-11-13 | Discharge: 2015-11-13 | Disposition: A | Discharge status: Home / Self Care | Payer: Medicare Other | Source: Ambulatory Visit | Attending: Cardiology | Admitting: Cardiology

## 2015-11-13 DIAGNOSIS — Z951 Presence of aortocoronary bypass graft: Secondary | ICD-10-CM

## 2015-11-15 ENCOUNTER — Encounter (HOSPITAL_COMMUNITY)
Admission: RE | Admit: 2015-11-15 | Discharge: 2015-11-15 | Disposition: A | Discharge status: Home / Self Care | Payer: Medicare Other | Source: Ambulatory Visit | Attending: Cardiology | Admitting: Cardiology

## 2015-11-15 DIAGNOSIS — Z951 Presence of aortocoronary bypass graft: Secondary | ICD-10-CM

## 2015-11-16 ENCOUNTER — Ambulatory Visit (INDEPENDENT_AMBULATORY_CARE_PROVIDER_SITE_OTHER): Payer: Medicare Other | Admitting: Pharmacist

## 2015-11-16 VITALS — BP 144/82

## 2015-11-16 DIAGNOSIS — I10 Essential (primary) hypertension: Secondary | ICD-10-CM

## 2015-11-16 DIAGNOSIS — I2510 Atherosclerotic heart disease of native coronary artery without angina pectoris: Secondary | ICD-10-CM | POA: Diagnosis not present

## 2015-11-16 NOTE — Patient Instructions (Addendum)
Take amlodipine 5 mg once daily.  You can take two of your 2.5mg  tablets once daily.   Please start checking your BP daily at home if you can get a new BP cuff and write your values down.   Please call the clinic at [phone]

## 2015-11-16 NOTE — Progress Notes (Signed)
Patient ID: Jeffrey Holmes                 DOB: [birth year]                        [id number]     HPI: Jeffrey Holmes is a 65 y.o. male referred by Dr. Radford Pax to HTN clinic. He has a PMH of ASCAD s/p CABG, HLD, HTN, and CKD.   Pt states he did have 1/3 glass of regular Dr Malachi Bonds today. He states that he has not had his BP meds this AM and does not currently check his BP at home due to his BP monitor not working anymore. His BP is checked in cardiac rehab 3x/week. He states that he was on a BP medication in the past that made his creatinine go up to >7 mg/dL but does not remember the name.   Current HTN meds: amlodipine 2.5 mg daily, metoprolol tartrate 50 mg BID Previously tried: n/a BP goal: <140/4mmHg  Family History: family history includes Heart attack in his mother; Kidney failure in his mother; Throat cancer in his father.  Social History:  reports that he has never smoked. He has never used smokeless tobacco. He reports that he drinks alcohol. He reports that he does not use illicit drugs.  Diet: Trying to limit fatty and fried foods and he does not add salt to foods or use salt substitute.  He states he has decreased fatty and fried foods significantly but it is still a work in progress.  Breakfast - cereal and fruit or egg or egg mcmuffin or hashbrowns Lunch - often doesn't eat but lunch meats - ham and cheese. Dinner - broiled fish or baked fish or fried fish. Trying to eat more salads but is cutting back on the regular dressing that he uses.  Denies coffee.  Does drink regular soft drinks, sweet tea, and lemonade.   Exercise: Cardiac rehab 3x per week and walking 2-3 miles per week.   Home BP readings:  SBP 115-170 ranges in cardiac rehab per patient report (sBP of 170 when he's exercising)  Wt Readings from Last 3 Encounters:  11/03/15 185 lb 1.9 oz (83.97 kg)  10/12/15 182 lb 1.6 oz (82.6 kg)  09/25/15 175 lb (79.379 kg)   BP Readings from Last 3 Encounters:  11/16/15 144/82    11/03/15 144/84  09/25/15 160/86   Pulse Readings from Last 3 Encounters:  11/03/15 60  09/25/15 60  09/04/15 72    Renal function: CrCl cannot be calculated (Patient has no serum creatinine result on file.).  Past Medical History  Diagnosis Date  . Chronic kidney disease, stage 3     multiple kidney stones  . H/O hiatal hernia   . Benign essential HTN 08/08/2015  . Hyperlipidemia 08/08/2015  . History of acute renal failure 2015    secondary to stones  . CAD (coronary artery disease), native coronary artery 08/28/2015    Severe multivessel ASCAD s/p CABG hybrid with LIMA to LAD and diag and PCI of the RCA.     Current Outpatient Prescriptions on File Prior to Visit  Medication Sig Dispense Refill  . aspirin EC 81 MG tablet Take 1 tablet (81 mg total) by mouth daily.    [name] atorvastatin (LIPITOR) 80 MG tablet Take 1 tablet (80 mg total) by mouth daily. 90 tablet 3  . clopidogrel (PLAVIX) 75 MG tablet Take 1 tablet (75 mg total) by mouth  daily. 30 tablet 3  . metoprolol (LOPRESSOR) 50 MG tablet Take 1 tablet (50 mg total) by mouth 2 (two) times daily. 180 tablet 3  . nitroGLYCERIN (NITROSTAT) 0.4 MG SL tablet Place 0.4 mg under the tongue as needed. Place 1 tablet under your tongue every 5 minutes as needed for chest pain for a max of 3 doses    . timolol (TIMOPTIC) 0.5 % ophthalmic solution Place 1 drop into both eyes 2 (two) times daily.     . travoprost, benzalkonium, (TRAVATAN) 0.004 % ophthalmic solution Place 1 drop into both eyes at bedtime.     No current facility-administered medications on file prior to visit.    Allergies  Allergen Reactions  . Vicodin [Hydrocodone-Acetaminophen] Nausea And Vomiting  . Hydrocodone Nausea And Vomiting  . Oxycontin [Oxycodone Hcl] Nausea And Vomiting  . Percocet [Oxycodone-Acetaminophen] Nausea And Vomiting     Assessment/Plan:  1. Hypertension: BP currently above goal of <140/90 on amlodipine 2.5 mg daily and metoprolol tartrate  50 mg BID. Patient has made significant diet changes to limit sodium, fats, and fried foods as well as has increased his exercise. Will increase amlodipine to 5 mg daily.  Instructed pt to obtain new BP cuff, to check his BP daily and to write the values down for his next clinic visit.  Encouraged patient to continue diet and exercise changes and to consider limiting his regular soda use.  Will f/u with pt in HTN clinic in 3 weeks.

## 2015-11-17 ENCOUNTER — Encounter (HOSPITAL_COMMUNITY)
Admission: RE | Admit: 2015-11-17 | Discharge: 2015-11-17 | Disposition: A | Discharge status: Home / Self Care | Payer: Medicare Other | Source: Ambulatory Visit | Attending: Cardiology | Admitting: Cardiology

## 2015-11-17 DIAGNOSIS — Z951 Presence of aortocoronary bypass graft: Secondary | ICD-10-CM

## 2015-11-20 ENCOUNTER — Encounter (HOSPITAL_COMMUNITY)
Admission: RE | Admit: 2015-11-20 | Discharge: 2015-11-20 | Disposition: A | Discharge status: Home / Self Care | Payer: Medicare Other | Source: Ambulatory Visit | Attending: Cardiology | Admitting: Cardiology

## 2015-11-20 DIAGNOSIS — Z951 Presence of aortocoronary bypass graft: Secondary | ICD-10-CM

## 2015-11-21 NOTE — Progress Notes (Signed)
Cardiac Individual Treatment Plan  Patient Details  Name: Jeffrey Holmes MRN: [id number] Date of Birth: 10/22/1950 Referring Provider:        CARDIAC REHAB PHASE II EXERCISE from 11/08/2015 in [place]   Referring Provider  Fransico Him MD      Initial Encounter Date:       CARDIAC REHAB PHASE II EXERCISE from 11/08/2015 in [place]   Date  10/12/15   Referring Provider  Fransico Him MD      Visit Diagnosis: S/P CABG x 2  Patient's Home Medications on Admission:  Current outpatient prescriptions:  .  amLODipine (NORVASC) 2.5 MG tablet, Take 2 tablets (5 mg total) by mouth daily., Disp: 180 tablet, Rfl: 3 .  aspirin EC 81 MG tablet, Take 1 tablet (81 mg total) by mouth daily., Disp: , Rfl:  .  atorvastatin (LIPITOR) 80 MG tablet, Take 1 tablet (80 mg total) by mouth daily., Disp: 90 tablet, Rfl: 3 .  clopidogrel (PLAVIX) 75 MG tablet, Take 1 tablet (75 mg total) by mouth daily., Disp: 30 tablet, Rfl: 3 .  metoprolol (LOPRESSOR) 50 MG tablet, Take 1 tablet (50 mg total) by mouth 2 (two) times daily., Disp: 180 tablet, Rfl: 3 .  nitroGLYCERIN (NITROSTAT) 0.4 MG SL tablet, Place 0.4 mg under the tongue as needed. Place 1 tablet under your tongue every 5 minutes as needed for chest pain for a max of 3 doses, Disp: , Rfl:  .  timolol (TIMOPTIC) 0.5 % ophthalmic solution, Place 1 drop into both eyes 2 (two) times daily. , Disp: , Rfl:  .  travoprost, benzalkonium, (TRAVATAN) 0.004 % ophthalmic solution, Place 1 drop into both eyes at bedtime., Disp: , Rfl:   Past Medical History: Past Medical History  Diagnosis Date  . Chronic kidney disease, stage 3     multiple kidney stones  . H/O hiatal hernia   . Benign essential HTN 08/08/2015  . Hyperlipidemia 08/08/2015  . History of acute renal failure 2015    secondary to stones  . CAD (coronary artery disease), native coronary artery 08/28/2015    Severe multivessel ASCAD s/p  CABG hybrid with LIMA to LAD and diag and PCI of the RCA.     Tobacco Use: History  Smoking status  . Never Smoker   Smokeless tobacco  . Never Used    Labs:     Recent Review Flowsheet Data    Labs for ITP Cardiac and Pulmonary Rehab Latest Ref Rng 08/21/2015 08/22/2015 08/22/2015 09/19/2015 11/03/2015   Cholestrol 125 - 200 mg/dL - - - 227(H) 183   LDLCALC <130 mg/dL - - - 150(H) 94   HDL >=40 mg/dL - - - 34(L) 41   Trlycerides <150 mg/dL - - - 217(H) 238(H)   PHART 7.350 - 7.450 7.349(L) 7.329(L) - - -   PCO2ART 35.0 - 45.0 mmHg 36.3 37.7 - - -   HCO3 20.0 - 24.0 mEq/L 20.0 20.0 - - -   TCO2 0 - 100 mmol/L 21 21 22  - -   ACIDBASEDEF 0.0 - 2.0 mmol/L 5.0(H) 6.0(H) - - -   O2SAT - 96.0 94.0 - - -      Capillary Blood Glucose: Lab Results  Component Value Date   GLUCAP 128* 08/25/2015   GLUCAP 120* 08/25/2015   GLUCAP 111* 08/24/2015   GLUCAP 100* 08/24/2015   GLUCAP 111* 08/24/2015     Exercise Target Goals:    Exercise  Program Goal: Individual exercise prescription set with THRR, safety & activity barriers. Participant demonstrates ability to understand and report RPE using BORG scale, to self-measure pulse accurately, and to acknowledge the importance of the exercise prescription.  Exercise Prescription Goal: Starting with aerobic activity 30 plus minutes a day, 3 days per week for initial exercise prescription. Provide home exercise prescription and guidelines that participant acknowledges understanding prior to discharge.  Activity Barriers & Risk Stratification:     Activity Barriers & Cardiac Risk Stratification - 10/12/15 1549    Activity Barriers & Cardiac Risk Stratification   Activity Barriers None      6 Minute Walk:     6 Minute Walk      10/12/15 1034       6 Minute Walk   Phase Initial     Distance 1683 feet     Walk Time 6 minutes     MPH 3.19     METS 3.69     RPE 9     Perceived Dyspnea  0     VO2 Peak 12.93     Symptoms No      Resting HR 76 bpm     Resting BP 142/78 mmHg     Max Ex. HR 98 bpm     Max Ex. BP 136/84 mmHg     2 Minute Post BP 134/84 mmHg     Pre/Post BP   Baseline BP 142/78 mmHg     6 Minute BP 136/84 mmHg     Pre/Post BP? Yes        Initial Exercise Prescription:     Initial Exercise Prescription - 11/09/15 1400    Date of Initial Exercise RX and Referring Provider   Date 10/12/15   Referring Provider Fransico Him MD   Treadmill   MPH 3   Grade 1   Minutes 10   METs 3.7   Bike   Level 1.2   Minutes 10   METs 3.71   NuStep   Level 2   Minutes 10   METs 3.4   Resistance Training   Training Prescription Yes   Weight 4 lbs   Reps 10-12      Perform Capillary Blood Glucose checks as needed.  Exercise Prescription Changes:      Exercise Prescription Changes      10/24/15 1200 11/03/15 0900 11/15/15 1100 11/23/15 1100     Exercise Review   Progression Yes  Yes Yes    Response to Exercise   Blood Pressure (Admit) 142/82 mmHg  124/66 mmHg 120/70 mmHg    Blood Pressure (Exercise) 164/80 mmHg  138/80 mmHg 144/80 mmHg    Blood Pressure (Exit) 148/86 mmHg  124/60 mmHg 104/60 mmHg    Heart Rate (Admit) 88 bpm  65 bpm 63 bpm    Heart Rate (Exercise) 111 bpm  93 bpm 89 bpm    Heart Rate (Exit) 83 bpm  62 bpm 61 bpm    Rating of Perceived Exertion (Exercise) 11  10 12     Comments Home Exercise Given on 10/23/15       Duration Progress to 30 minutes of continuous aerobic without signs/symptoms of physical distress  Progress to 30 minutes of continuous aerobic without signs/symptoms of physical distress Progress to 30 minutes of continuous aerobic without signs/symptoms of physical distress    Intensity THRR unchanged  THRR unchanged THRR unchanged    Progression   Progression Continue to progress workloads to maintain intensity without signs/symptoms of  physical distress.  Continue to progress workloads to maintain intensity without signs/symptoms of physical distress. Continue to  progress workloads to maintain intensity without signs/symptoms of physical distress.    Average METs 3.1 3.8 4.1     Resistance Training   Training Prescription Yes  Yes Yes    Weight 4 lbs  4LBS 4lbs    Reps 10-12  10-12 10-12    Interval Training   Interval Training No       Treadmill   MPH 3  3 3     Grade 1  4 4     Minutes 10  10 10     METs 3.7  4.95 4.95    Bike   Level 1.2  1.2 1.5    Minutes 10  10 10     METs 3.71  3.74 4.39    NuStep   Level 2  4 5     Minutes 10  10 10     METs 3.4  3.3 4.5    Home Exercise Plan   Plans to continue exercise at Home  walking       Frequency Add 2 additional days to program exercise sessions.          Exercise Comments:      Exercise Comments      10/24/15 1233 11/15/15 1115 11/23/15 1116       Exercise Comments Pt is tolerating exercise well.  Will continue to monitor for exercise progression. Reviewed METs and goals, will continue to monitor exercise progression and weightloss.  pt is tolerating exercise well, will continue to monitor exercise progression        Discharge Exercise Prescription (Final Exercise Prescription Changes):     Exercise Prescription Changes - 11/23/15 1100    Exercise Review   Progression Yes   Response to Exercise   Blood Pressure (Admit) 120/70 mmHg   Blood Pressure (Exercise) 144/80 mmHg   Blood Pressure (Exit) 104/60 mmHg   Heart Rate (Admit) 63 bpm   Heart Rate (Exercise) 89 bpm   Heart Rate (Exit) 61 bpm   Rating of Perceived Exertion (Exercise) 12   Duration Progress to 30 minutes of continuous aerobic without signs/symptoms of physical distress   Intensity THRR unchanged   Progression   Progression Continue to progress workloads to maintain intensity without signs/symptoms of physical distress.   Resistance Training   Training Prescription Yes   Weight 4lbs   Reps 10-12   Treadmill   MPH 3   Grade 4   Minutes 10   METs 4.95   Bike   Level 1.5   Minutes 10   METs 4.39    NuStep   Level 5   Minutes 10   METs 4.5      Nutrition:  Target Goals: Understanding of nutrition guidelines, daily intake of sodium 1500mg , cholesterol 200mg , calories 30% from fat and 7% or less from saturated fats, daily to have 5 or more servings of fruits and vegetables.  Biometrics:     Pre Biometrics - 10/12/15 1210    Pre Biometrics   Height 5' 6.25" (1.683 m)   Weight 182 lb 1.6 oz (82.6 kg)   Waist Circumference 39 inches   Hip Circumference 39 inches   Waist to Hip Ratio 1 %   BMI (Calculated) 29.2   Triceps Skinfold 13 mm   % Body Fat 27.2 %   Grip Strength 46 kg   Flexibility 7.5 in   Single Leg Stand 7.3 seconds  Nutrition Therapy Plan and Nutrition Goals:     Nutrition Therapy & Goals - 10/12/15 1521    Nutrition Therapy   Diet Therapeutic Lifestyle Changes, Consistent CHO   Personal Nutrition Goals   Personal Goal #1 6-20 lb wt loss at graduation from Cardiac Rehab   Personal Goal #2 Basic understanding of diabetic diet principles (A1c 6.9)   Intervention Plan   Intervention Prescribe, educate and counsel regarding individualized specific dietary modifications aiming towards targeted core components such as weight, hypertension, lipid management, diabetes, heart failure and other comorbidities.   Expected Outcomes Short Term Goal: Understand basic principles of dietary content, such as calories, fat, sodium, cholesterol and nutrients.;Long Term Goal: Adherence to prescribed nutrition plan.      Nutrition Discharge: Nutrition Scores:     Nutrition Assessments - 10/25/15 1047    MEDFICTS Scores   Pre Score 48      Nutrition Goals Re-Evaluation:     Nutrition Goals Re-Evaluation      10/25/15 1022           Personal Goal #1 Re-Evaluation   Personal Goal #1 6-20 lb wt loss at graduation from Cardiac Rehab       Goal Progress Seen No       Comments Pt knows he needs to limit sugary beverages (soda, lemonade, sweet tea) to help  promote wt loss.       Personal Goal #2 Re-Evaluation   Personal Goal #2 Basic understanding of diabetic diet principles (A1c 6.9)       Goal Progress Seen Yes       Comments Pt aware of the need to limit sugary beverages to help with blood sugar control. Pt in the contemplative state of change.        Intervention Plan   Intervention Continue to educate, counsel and set short/long term goals regarding individualized specific personal dietary modifications.;Nutrition handout(s) given to patient.       Comments Type 2 DM handout provided for pt to review          Psychosocial: Target Goals: Acknowledge presence or absence of depression, maximize coping skills, provide positive support system. Participant is able to verbalize types and ability to use techniques and skills needed for reducing stress and depression.  Initial Review & Psychosocial Screening:     Initial Psych Review & Screening - 10/16/15 [place]? Yes   Barriers   Psychosocial barriers to participate in program There are no identifiable barriers or psychosocial needs.      Quality of Life Scores:     Quality of Life - 10/25/15 1044    Quality of Life Scores   GLOBAL Post --  quality of life scores reviewed with patient.  pt demononstrates good coping skills with positive outlook and family support.        PHQ-9:     Recent Review Flowsheet Data    Depression screen [facility] 2/9 10/16/2015   Decreased Interest 0   Down, Depressed, Hopeless 0   PHQ - 2 Score 0      Psychosocial Evaluation and Intervention:     Psychosocial Evaluation - 11/17/15 0901    Psychosocial Evaluation & Interventions   Interventions Encouraged to exercise with the program and follow exercise prescription   Comments psychosocial interventions for preventative maintenance and emotional wellness   Continued Psychosocial Services Needed Yes      Psychosocial Re-Evaluation:     Psychosocial  Re-Evaluation  10/24/15 1641           Psychosocial Re-Evaluation   Interventions Stress management education;Relaxation education;Encouraged to attend Cardiac Rehabilitation for the exercise       Continued Psychosocial Services Needed Yes          Vocational Rehabilitation: Provide vocational rehab assistance to qualifying candidates.   Vocational Rehab Evaluation & Intervention:     Vocational Rehab - 10/18/15 0912    Initial Vocational Rehab Evaluation & Intervention   Assessment shows need for Vocational Rehabilitation No  pt inadvertantly checked yes on voc rehab form.  pt is retired.      Education: Education Goals: Education classes will be provided on a weekly basis, covering required topics. Participant will state understanding/return demonstration of topics presented.  Learning Barriers/Preferences:     Learning Barriers/Preferences - 10/12/15 [id number]    Learning Barriers/Preferences   Learning Barriers Sight  reading glasses   Learning Preferences Group Instruction;Individual Instruction;Verbal Instruction;Written Material      Education Topics: Count Your Pulse:  -Group instruction provided by verbal instruction, demonstration, patient participation and written materials to support subject.  Instructors address importance of being able to find your pulse and how to count your pulse when at home without a heart monitor.  Patients get hands on experience counting their pulse with staff help and individually.      CARDIAC REHAB PHASE II EXERCISE from 11/17/2015 in [place]   Date  11/03/15   Educator  J   Instruction Review Code  2- meets goals/outcomes      Heart Attack, Angina, and Risk Factor Modification:  -Group instruction provided by verbal instruction, video, and written materials to support subject.  Instructors address signs and symptoms of angina and heart attacks.    Also discuss risk factors for heart disease  and how to make changes to improve heart health risk factors.   Functional Fitness:  -Group instruction provided by verbal instruction, demonstration, patient participation, and written materials to support subject.  Instructors address safety measures for doing things around the house.  Discuss how to get up and down off the floor, how to pick things up properly, how to safely get out of a chair without assistance, and balance training.      CARDIAC REHAB PHASE II EXERCISE from 11/17/2015 in [place]   Date  11/17/15 [Second Time for class]   Educator  [place]   Instruction Review Code  R- Review/reinforce      Meditation and Mindfulness:  -Group instruction provided by verbal instruction, patient participation, and written materials to support subject.  Instructor addresses importance of mindfulness and meditation practice to help reduce stress and improve awareness.  Instructor also leads participants through a meditation exercise.    Stretching for Flexibility and Mobility:  -Group instruction provided by verbal instruction, patient participation, and written materials to support subject.  Instructors lead participants through series of stretches that are designed to increase flexibility thus improving mobility.  These stretches are additional exercise for major muscle groups that are typically performed during regular warm up and cool down.      CARDIAC REHAB PHASE II EXERCISE from 11/17/2015 in [place]   Date  11/10/15   Educator  [place]   Instruction Review Code  2- meets goals/outcomes      Hands Only CPR Anytime:  -Group instruction provided by verbal instruction, video, patient participation and written materials  to support subject.  Instructors co-teach with AHA video for hands only CPR.  Participants get hands on experience with mannequins.   Nutrition I class: Heart Healthy Eating:  -Group instruction  provided by PowerPoint slides, verbal discussion, and written materials to support subject matter. The instructor gives an explanation and review of the Therapeutic Lifestyle Changes diet recommendations, which includes a discussion on lipid goals, dietary fat, sodium, fiber, plant stanol/sterol esters, sugar, and the components of a well-balanced, healthy diet.      CARDIAC REHAB PHASE II EXERCISE from 11/17/2015 in [place]   Date  10/25/15   Educator  RD   Instruction Review Code  Not applicable [handout given]      Nutrition II class: Lifestyle Skills:  -Group instruction provided by PowerPoint slides, verbal discussion, and written materials to support subject matter. The instructor gives an explanation and review of label reading, grocery shopping for heart health, heart healthy recipe modifications, and ways to make healthier choices when eating out.      CARDIAC REHAB PHASE II EXERCISE from 11/17/2015 in [place]   Date  10/25/15   Educator  RD   Instruction Review Code  Not applicable [Handout given]      Diabetes Question & Answer:  -Group instruction provided by PowerPoint slides, verbal discussion, and written materials to support subject matter. The instructor gives an explanation and review of diabetes co-morbidities, pre- and post-prandial blood glucose goals, pre-exercise blood glucose goals, signs, symptoms, and treatment of hypoglycemia and hyperglycemia, and foot care basics.   Diabetes Blitz:  -Group instruction provided by PowerPoint slides, verbal discussion, and written materials to support subject matter. The instructor gives an explanation and review of the physiology behind type 1 and type 2 diabetes, diabetes medications and rational behind using different medications, pre- and post-prandial blood glucose recommendations and Hemoglobin A1c goals, diabetes diet, and exercise including blood glucose  guidelines for exercising safely.    Portion Distortion:  -Group instruction provided by PowerPoint slides, verbal discussion, written materials, and food models to support subject matter. The instructor gives an explanation of serving size versus portion size, changes in portions sizes over the last 20 years, and what consists of a serving from each food group.   Stress Management:  -Group instruction provided by verbal instruction, video, and written materials to support subject matter.  Instructors review role of stress in heart disease and how to cope with stress positively.        CARDIAC REHAB PHASE II EXERCISE from 11/17/2015 in [place]   Date  11/15/15   Instruction Review Code  2- meets goals/outcomes      Exercising on Your Own:  -Group instruction provided by verbal instruction, power point, and written materials to support subject.  Instructors discuss benefits of exercise, components of exercise, frequency and intensity of exercise, and end points for exercise.  Also discuss use of nitroglycerin and activating EMS.  Review options of places to exercise outside of rehab.  Review guidelines for sex with heart disease.      CARDIAC REHAB PHASE II EXERCISE from 11/17/2015 in [place]   Date  11/01/15   Educator  [place]   Instruction Review Code  2- meets goals/outcomes      Cardiac Drugs I:  -Group instruction provided by verbal instruction and written materials to support subject.  Instructor reviews cardiac drug classes: antiplatelets, anticoagulants,  beta blockers, and statins.  Instructor discusses reasons, side effects, and lifestyle considerations for each drug class.      CARDIAC REHAB PHASE II EXERCISE from 11/17/2015 in [place]   Date  11/08/15   Educator  Clydia Llano   Instruction Review Code  2- meets goals/outcomes      Cardiac Drugs II:  -Group instruction  provided by verbal instruction and written materials to support subject.  Instructor reviews cardiac drug classes: angiotensin converting enzyme inhibitors (ACE-I), angiotensin II receptor blockers (ARBs), nitrates, and calcium channel blockers.  Instructor discusses reasons, side effects, and lifestyle considerations for each drug class.          CARDIAC REHAB PHASE II EXERCISE from 11/17/2015 in [place]   Date  10/18/15   Educator  Clydia Llano, Pharm D   Instruction Review Code  2- meets goals/outcomes      Anatomy and Physiology of the Circulatory System:  -Group instruction provided by verbal instruction, video, and written materials to support subject.  Reviews functional anatomy of heart, how it relates to various diagnoses, and what role the heart plays in the overall system.   Knowledge Questionnaire Score:     Knowledge Questionnaire Score - 10/18/15 0811    Knowledge Questionnaire Score   Pre Score 88      Core Components/Risk Factors/Patient Goals at Admission:     Personal Goals and Risk Factors at Admission - 11/15/15 1110    Core Components/Risk Factors/Patient Goals on Admission    Weight Management Weight Loss   Intervention Weight Management: Develop a combined nutrition and exercise program designed to reach desired caloric intake, while maintaining appropriate intake of nutrient and fiber, sodium and fats, and appropriate energy expenditure required for the weight goal.   Expected Outcomes Short Term: Continue to assess and modify interventions until short term weight is achieved   Intervention increase exercise tolerance and METs in order to get back to playing tennis   Expected Outcomes Increase exercise capacity, exercise tolerance and confidence with activity. Continue to monitor weightloss.      Core Components/Risk Factors/Patient Goals Review:      Goals and Risk Factor Review      11/03/15 0946           Core  Components/Risk Factors/Patient Goals Review   Personal Goals Review Increase Strength and Stamina;Other       Review noticed that breathing quality is better but mild chest discomfort       Expected Outcomes Increased exercise capacity and confidence with exercise. Will continue to monitor exericse progression          Core Components/Risk Factors/Patient Goals at Discharge (Final Review):      Goals and Risk Factor Review - 11/03/15 0946    Core Components/Risk Factors/Patient Goals Review   Personal Goals Review Increase Strength and Stamina;Other   Review noticed that breathing quality is better but mild chest discomfort   Expected Outcomes Increased exercise capacity and confidence with exercise. Will continue to monitor exericse progression      ITP Comments:     ITP Comments      10/12/15 0803           ITP Comments Medical Director-Dr. Fransico Him, MD          Comments: Pt is making expected progress toward personal goals after completing  18 sessions.  Pt hypertension improving with medication adjustments.  Recommend continued exercise and  life style modification education including  stress management and relaxation techniques to decrease cardiac risk profile.

## 2015-11-22 ENCOUNTER — Encounter (HOSPITAL_COMMUNITY)
Admission: RE | Admit: 2015-11-22 | Discharge: 2015-11-22 | Disposition: A | Discharge status: Home / Self Care | Payer: Medicare Other | Source: Ambulatory Visit | Attending: Cardiology | Admitting: Cardiology

## 2015-11-22 DIAGNOSIS — Z951 Presence of aortocoronary bypass graft: Secondary | ICD-10-CM

## 2015-11-24 ENCOUNTER — Other Ambulatory Visit: Payer: Self-pay | Admitting: Physician Assistant

## 2015-11-24 ENCOUNTER — Encounter (HOSPITAL_COMMUNITY)
Admission: RE | Admit: 2015-11-24 | Discharge: 2015-11-24 | Disposition: A | Discharge status: Home / Self Care | Payer: Medicare Other | Source: Ambulatory Visit | Attending: Cardiology | Admitting: Cardiology

## 2015-11-24 DIAGNOSIS — Z951 Presence of aortocoronary bypass graft: Secondary | ICD-10-CM

## 2015-11-27 ENCOUNTER — Encounter (HOSPITAL_COMMUNITY)
Admission: RE | Admit: 2015-11-27 | Discharge: 2015-11-27 | Disposition: A | Discharge status: Home / Self Care | Payer: Medicare Other | Source: Ambulatory Visit | Attending: Cardiology | Admitting: Cardiology

## 2015-11-27 DIAGNOSIS — Z951 Presence of aortocoronary bypass graft: Secondary | ICD-10-CM | POA: Diagnosis not present

## 2015-11-29 ENCOUNTER — Encounter (HOSPITAL_COMMUNITY)
Admission: RE | Admit: 2015-11-29 | Discharge: 2015-11-29 | Disposition: A | Discharge status: Home / Self Care | Payer: Medicare Other | Source: Ambulatory Visit | Attending: Cardiology | Admitting: Cardiology

## 2015-11-29 DIAGNOSIS — Z951 Presence of aortocoronary bypass graft: Secondary | ICD-10-CM

## 2015-11-30 ENCOUNTER — Ambulatory Visit (INDEPENDENT_AMBULATORY_CARE_PROVIDER_SITE_OTHER): Payer: Medicare Other | Admitting: Pharmacist

## 2015-11-30 ENCOUNTER — Encounter: Payer: Self-pay | Admitting: Pharmacist

## 2015-11-30 VITALS — BP 132/78 | HR 63

## 2015-11-30 DIAGNOSIS — I2510 Atherosclerotic heart disease of native coronary artery without angina pectoris: Secondary | ICD-10-CM

## 2015-11-30 DIAGNOSIS — I10 Essential (primary) hypertension: Secondary | ICD-10-CM

## 2015-11-30 NOTE — Progress Notes (Signed)
Patient ID: Jeffrey Holmes                 DOB: 1950/12/01                      MRN: [id number]     HPI: REGORY CLUNEY is a 65 y.o. male referred by Dr. Radford Pax to HTN clinic. He has a PMH of ASCVD s/p CABG, HLD, HTN, and CKD.   Pt states he is doing well after recently increasing amlodipine to 5mg  on 4/20. Denies dizziness, lightheadedness, or LE edema. Does not currently check his BP at home as his BP monitor no longer works however, a new one is being sent to him. Attends cardiac rehab 3x per week; BP controlled at sessions with a few hypertensive excursions.    Current HTN meds: amlodipine 5mg  daily (4/20 start), metoprolol tartrate 50 mg BID  Previously tried: lotrel 10/20mg , valsartan 160mg , amlodipine 2.5mg   BP goal: <140/84mmHg  Family History: MI in his mother; Kidney failure in his mother; Throat cancer in his father.  Social History: Never smoked/used smokeless tobacco. Reports drinking alcohol. Denies illicit drug use.   Diet: Limiting fatty foods and salt intake.    Exercise: Cardiac rehab 3x per week in addition to walking 2-3 miles per week.    Wt Readings from Last 3 Encounters:  11/03/15 185 lb 1.9 oz (83.97 kg)  10/12/15 182 lb 1.6 oz (82.6 kg)  09/25/15 175 lb (79.379 kg)   BP Readings from Last 3 Encounters:  11/30/15 132/78  11/16/15 144/82  11/03/15 144/84   Pulse Readings from Last 3 Encounters:  11/30/15 63  11/03/15 60  09/25/15 60    Renal function: CrCl cannot be calculated (Unknown ideal weight.).  Past Medical History  Diagnosis Date  . Chronic kidney disease, stage 3     multiple kidney stones  . H/O hiatal hernia   . Benign essential HTN 08/08/2015  . Hyperlipidemia 08/08/2015  . History of acute renal failure 2015    secondary to stones  . CAD (coronary artery disease), native coronary artery 08/28/2015    Severe multivessel ASCAD s/p CABG hybrid with LIMA to LAD and diag and PCI of the RCA.     Current Outpatient Prescriptions on File Prior  to Visit  Medication Sig Dispense Refill  . amLODipine (NORVASC) 2.5 MG tablet Take 2 tablets (5 mg total) by mouth daily. 180 tablet 3  . aspirin EC 81 MG tablet Take 1 tablet (81 mg total) by mouth daily.    [name] atorvastatin (LIPITOR) 80 MG tablet Take 1 tablet (80 mg total) by mouth daily. 90 tablet 3  . clopidogrel (PLAVIX) 75 MG tablet Take 1 tablet (75 mg total) by mouth daily. 30 tablet 3  . metoprolol (LOPRESSOR) 50 MG tablet Take 1 tablet (50 mg total) by mouth 2 (two) times daily. 180 tablet 3  . nitroGLYCERIN (NITROSTAT) 0.4 MG SL tablet Place 0.4 mg under the tongue as needed. Place 1 tablet under your tongue every 5 minutes as needed for chest pain for a max of 3 doses    . timolol (TIMOPTIC) 0.5 % ophthalmic solution Place 1 drop into both eyes 2 (two) times daily.     . travoprost, benzalkonium, (TRAVATAN) 0.004 % ophthalmic solution Place 1 drop into both eyes at bedtime.     No current facility-administered medications on file prior to visit.    Allergies  Allergen Reactions  . Vicodin [Hydrocodone-Acetaminophen] Nausea And  Vomiting  . Hydrocodone Nausea And Vomiting  . Oxycontin [Oxycodone Hcl] Nausea And Vomiting  . Percocet [Oxycodone-Acetaminophen] Nausea And Vomiting     Assessment/Plan:  1.) Hypertension: BP controlled in clinic today at 132/78 with HR 63. No medication titration necessary at this time, will continue amlodipine 5mg  and metoprolol 50mg  BID. Encouraged pt to continue making progress with diet and cardiac rehab. Advised pt to call clinic if his BP becomes elevated.   Stephens November, PharmD Clinical Pharmacy Resident 8:35 AM, 11/30/2015

## 2015-12-01 ENCOUNTER — Encounter (HOSPITAL_COMMUNITY)
Admission: RE | Admit: 2015-12-01 | Discharge: 2015-12-01 | Disposition: A | Discharge status: Home / Self Care | Payer: Medicare Other | Source: Ambulatory Visit | Attending: Cardiology | Admitting: Cardiology

## 2015-12-01 DIAGNOSIS — Z951 Presence of aortocoronary bypass graft: Secondary | ICD-10-CM | POA: Diagnosis not present

## 2015-12-04 ENCOUNTER — Emergency Department (HOSPITAL_COMMUNITY)
Admission: EM | Admit: 2015-12-04 | Discharge: 2015-12-04 | Discharge status: Against Medical Advice | Payer: Medicare Other

## 2015-12-04 ENCOUNTER — Encounter (HOSPITAL_COMMUNITY): Admission: RE | Admit: 2015-12-04 | Payer: Medicare Other | Source: Ambulatory Visit

## 2015-12-05 ENCOUNTER — Emergency Department (HOSPITAL_COMMUNITY): Payer: Medicare Other

## 2015-12-05 ENCOUNTER — Encounter (HOSPITAL_COMMUNITY): Payer: Self-pay

## 2015-12-05 ENCOUNTER — Emergency Department (HOSPITAL_COMMUNITY)
Admission: EM | Admit: 2015-12-05 | Discharge: 2015-12-05 | Disposition: A | Discharge status: Home / Self Care | Payer: Medicare Other | Attending: Emergency Medicine | Admitting: Emergency Medicine

## 2015-12-05 DIAGNOSIS — I2510 Atherosclerotic heart disease of native coronary artery without angina pectoris: Secondary | ICD-10-CM | POA: Insufficient documentation

## 2015-12-05 DIAGNOSIS — E785 Hyperlipidemia, unspecified: Secondary | ICD-10-CM | POA: Diagnosis not present

## 2015-12-05 DIAGNOSIS — E875 Hyperkalemia: Secondary | ICD-10-CM | POA: Diagnosis not present

## 2015-12-05 DIAGNOSIS — R109 Unspecified abdominal pain: Secondary | ICD-10-CM

## 2015-12-05 DIAGNOSIS — I129 Hypertensive chronic kidney disease with stage 1 through stage 4 chronic kidney disease, or unspecified chronic kidney disease: Secondary | ICD-10-CM | POA: Diagnosis not present

## 2015-12-05 DIAGNOSIS — N132 Hydronephrosis with renal and ureteral calculous obstruction: Secondary | ICD-10-CM | POA: Diagnosis not present

## 2015-12-05 DIAGNOSIS — N183 Chronic kidney disease, stage 3 (moderate): Secondary | ICD-10-CM | POA: Insufficient documentation

## 2015-12-05 DIAGNOSIS — R1032 Left lower quadrant pain: Secondary | ICD-10-CM | POA: Diagnosis present

## 2015-12-05 DIAGNOSIS — Z79899 Other long term (current) drug therapy: Secondary | ICD-10-CM | POA: Diagnosis not present

## 2015-12-05 DIAGNOSIS — N200 Calculus of kidney: Secondary | ICD-10-CM | POA: Insufficient documentation

## 2015-12-05 DIAGNOSIS — Z7902 Long term (current) use of antithrombotics/antiplatelets: Secondary | ICD-10-CM | POA: Insufficient documentation

## 2015-12-05 DIAGNOSIS — N1330 Unspecified hydronephrosis: Secondary | ICD-10-CM | POA: Diagnosis not present

## 2015-12-05 DIAGNOSIS — Z7982 Long term (current) use of aspirin: Secondary | ICD-10-CM | POA: Insufficient documentation

## 2015-12-05 LAB — COMPREHENSIVE METABOLIC PANEL
ALBUMIN: 3.9 g/dL (ref 3.5–5.0)
ALK PHOS: 86 U/L (ref 38–126)
ALT: 25 U/L (ref 17–63)
AST: 25 U/L (ref 15–41)
Anion gap: 11 (ref 5–15)
BILIRUBIN TOTAL: 0.8 mg/dL (ref 0.3–1.2)
BUN: 16 mg/dL (ref 6–20)
CO2: 26 mmol/L (ref 22–32)
Calcium: 9.9 mg/dL (ref 8.9–10.3)
Chloride: 108 mmol/L (ref 101–111)
Creatinine, Ser: 2.01 mg/dL — ABNORMAL HIGH (ref 0.61–1.24)
GFR calc Af Amer: 38 mL/min — ABNORMAL LOW (ref >60)
GFR, EST NON AFRICAN AMERICAN: 33 mL/min — AB (ref >60)
GLUCOSE: 168 mg/dL — AB (ref 65–99)
POTASSIUM: 5.3 mmol/L — AB (ref 3.5–5.1)
Sodium: 145 mmol/L (ref 135–145)
TOTAL PROTEIN: 7.3 g/dL (ref 6.5–8.1)

## 2015-12-05 LAB — CBC WITH DIFFERENTIAL/PLATELET
BASOS ABS: 0 10*3/uL (ref 0.0–0.1)
BASOS PCT: 0 %
Eosinophils Absolute: 0.3 10*3/uL (ref 0.0–0.7)
Eosinophils Relative: 4 %
HEMATOCRIT: 48.5 % (ref 39.0–52.0)
HEMOGLOBIN: 15.6 g/dL (ref 13.0–17.0)
LYMPHS PCT: 29 %
Lymphs Abs: 2.4 10*3/uL (ref 0.7–4.0)
MCH: 29.7 pg (ref 26.0–34.0)
MCHC: 32.2 g/dL (ref 30.0–36.0)
MCV: 92.2 fL (ref 78.0–100.0)
Monocytes Absolute: 0.6 10*3/uL (ref 0.1–1.0)
Monocytes Relative: 8 %
NEUTROS ABS: 4.9 10*3/uL (ref 1.7–7.7)
NEUTROS PCT: 59 %
Platelets: 246 10*3/uL (ref 150–400)
RBC: 5.26 MIL/uL (ref 4.22–5.81)
RDW: 13.9 % (ref 11.5–15.5)
WBC: 8.3 10*3/uL (ref 4.0–10.5)

## 2015-12-05 LAB — URINALYSIS, ROUTINE W REFLEX MICROSCOPIC
Bilirubin Urine: NEGATIVE
Glucose, UA: NEGATIVE mg/dL
KETONES UR: NEGATIVE mg/dL
LEUKOCYTES UA: NEGATIVE
NITRITE: NEGATIVE
PH: 5.5 (ref 5.0–8.0)
Protein, ur: 100 mg/dL — AB
SPECIFIC GRAVITY, URINE: 1.024 (ref 1.005–1.030)

## 2015-12-05 LAB — URINE MICROSCOPIC-ADD ON: Squamous Epithelial / LPF: NONE SEEN

## 2015-12-05 LAB — LIPASE, BLOOD: LIPASE: 42 U/L (ref 11–51)

## 2015-12-05 MED ORDER — SODIUM CHLORIDE 0.9 % IV BOLUS (SEPSIS)
500.0000 mL | Freq: Once | INTRAVENOUS | Status: AC
  Administered 2015-12-05: 500 mL via INTRAVENOUS

## 2015-12-05 NOTE — Discharge Instructions (Signed)
Take tylenol every 4 hrs as needed for pain, for severe pain take tramadol. Have potassium level rechecked later this week. See Dr Roni Bread in 1-2 weeks or sooner if fever, pain with urination or uncontrolled pain/ vomiting.  If you were given medicines take as directed.  If you are on coumadin or contraceptives realize their levels and effectiveness is altered by many different medicines.  If you have any reaction (rash, tongues swelling, other) to the medicines stop taking and see a physician.    If your blood pressure was elevated in the ER make sure you follow up for management with a primary doctor or return for chest pain, shortness of breath or stroke symptoms.  Please follow up as directed and return to the ER or see a physician for new or worsening symptoms.  Thank you. Filed Vitals:   12/05/15 0820  BP: 140/83  Pulse: 70  Temp: 98.3 F (36.8 C)  TempSrc: Oral  Resp: 16  Height: 5\' 5"  (1.651 m)  Weight: 184 lb (83.462 kg)  SpO2: 97%

## 2015-12-05 NOTE — ED Notes (Signed)
MD at bedside. 

## 2015-12-05 NOTE — ED Notes (Signed)
Patient transported to CT 

## 2015-12-05 NOTE — ED Notes (Signed)
Pt presents with 1 year h/o L lower abdominal pain that comes and goes, radiates into L groin.  Pt denies any nausea, vomiting or diarrhea, denies dysuria or hematuria.

## 2015-12-05 NOTE — ED Provider Notes (Signed)
CSN: [id number]     Arrival date & time 12/05/15  0810 History   First MD Initiated Contact with Patient 12/05/15 0825     No chief complaint on file.    (Consider location/radiation/quality/duration/timing/severity/associated sxs/prior Treatment) HPI Comments: 65 year old male with history of coronary artery disease, multiple kidney stones presents with recurrent left groin pain and left lower abdominal pain. Pain became more severe this weekend. Radiates to the left groin. No fevers or chills. No hematuria. Feels different than previous kidney stones patient has passed stones in the past  The history is provided by the patient.    Past Medical History  Diagnosis Date  . Chronic kidney disease, stage 3     multiple kidney stones  . H/O hiatal hernia   . Benign essential HTN 08/08/2015  . Hyperlipidemia 08/08/2015  . History of acute renal failure 2015    secondary to stones  . CAD (coronary artery disease), native coronary artery 08/28/2015    Severe multivessel ASCAD s/p CABG hybrid with LIMA to LAD and diag and PCI of the RCA.    Past Surgical History  Procedure Laterality Date  . Back surgery  2006    microdisectomy  . Lithotripsy      2-3 times in past  . Cystoscopy with retrograde pyelogram, ureteroscopy and stent placement Right 01/06/2014    Procedure: CYSTOSCOPY WITH RETROGRADE PYELOGRAM, URETEROSCOPY AND STENT PLACEMENT;  Surgeon: Bernestine Amass, MD;  Location: [facility];  Service: Urology;  Laterality: Right;  . Cardiovascular stress test  08/15/2015  . Cardiac catheterization N/A 08/18/2015    Procedure: Left Heart Cath and Coronary Angiography;  Surgeon: Jettie Booze, MD;  Location: [place] [facility];  Service: Cardiovascular;  Laterality: N/A;  . Tee without cardioversion N/A 08/21/2015    Procedure: TRANSESOPHAGEAL ECHOCARDIOGRAM (TEE);  Surgeon: [name], MD;  Location: [place];  Service: Open Heart Surgery;  Laterality: N/A;  . Coronary artery bypass graft  N/A 08/21/2015    Procedure: OFF PUMP CORONARY ARTERY BYPASS GRAFTING (CABG), TIMES TWO, USING LEFT INTERNAL MAMMARY ARTERY, RIGHT GREATER SAPHENOUS VEIN HARVESTED ENDOSCOPICALLY;  Surgeon: [name], MD;  Location: [place];  Service: Open Heart Surgery;  Laterality: N/A;  . Angioplasty N/A 08/21/2015    Procedure: ANGIOPLASTY WITH PERCUTANEOUS CORONARY INTERVENTION WITH DES TO RIGHT CORONARY ARTERY;  Surgeon: Jettie Booze, MD;  Location: [place];  Service: Cardiovascular;  Laterality: N/A;  . Cardiac catheterization N/A 08/21/2015    Procedure: Coronary Stent Intervention;  Surgeon: Jettie Booze, MD;  Location: [place] [facility];  Service: Cardiovascular;  Laterality: N/A;  . Cardiac catheterization  08/21/2015    Procedure: Bypass Graft Angiography;  Surgeon: Jettie Booze, MD;  Location: [place] [facility];  Service: Cardiovascular;;   Family History  Problem Relation Age of Onset  . Kidney failure Mother   . Throat cancer Father   . Heart attack Mother     angina    Social History  Substance Use Topics  . Smoking status: Never Smoker   . Smokeless tobacco: Never Used  . Alcohol Use: Yes     Comment: occasional every 2-3 months    Review of Systems  Constitutional: Negative for fever and chills.  HENT: Negative for congestion.   Eyes: Negative for visual disturbance.  Respiratory: Negative for shortness of breath.   Cardiovascular: Negative for chest pain.  Gastrointestinal: Positive for abdominal pain. Negative for vomiting.  Genitourinary: Negative for dysuria and flank pain.  Musculoskeletal: Negative for back pain, neck pain and neck stiffness.  Skin: Negative for rash.  Neurological: Negative for light-headedness and headaches.      Allergies  Vicodin; Hydrocodone; Oxycontin; and Percocet  Home Medications   Prior to Admission medications   Medication Sig Start Date End Date Taking? Authorizing Provider  amLODipine (NORVASC) 2.5 MG tablet  Take 2 tablets (5 mg total) by mouth daily. 11/16/15  Yes Fay Records, MD  aspirin EC 81 MG tablet Take 1 tablet (81 mg total) by mouth daily. 08/25/15  Yes Erin R Barrett, PA-C  atorvastatin (LIPITOR) 80 MG tablet Take 1 tablet (80 mg total) by mouth daily. 11/07/15  Yes Sueanne Margarita, MD  clopidogrel (PLAVIX) 75 MG tablet Take 1 tablet (75 mg total) by mouth daily. 08/25/15  Yes Erin R Barrett, PA-C  metoprolol tartrate (LOPRESSOR) 25 MG tablet Take 25 mg by mouth 2 (two) times daily. 11/07/15  Yes Historical Provider, MD  nitroGLYCERIN (NITROSTAT) 0.4 MG SL tablet Place 0.4 mg under the tongue as needed. Place 1 tablet under your tongue every 5 minutes as needed for chest pain for a max of 3 doses 08/08/15  Yes Historical Provider, MD  timolol (TIMOPTIC) 0.5 % ophthalmic solution Place 1 drop into both eyes 2 (two) times daily.  07/19/15  Yes Historical Provider, MD  travoprost, benzalkonium, (TRAVATAN) 0.004 % ophthalmic solution Place 1 drop into both eyes at bedtime.   Yes Historical Provider, MD  metoprolol (LOPRESSOR) 50 MG tablet Take 1 tablet (50 mg total) by mouth 2 (two) times daily. Patient not taking: Reported on 12/05/2015 09/04/15   Isaiah Serge, NP   BP 140/83 mmHg  Pulse 70  Temp(Src) 98.3 F (36.8 C) (Oral)  Resp 16  Ht 5\' 5"  (1.651 m)  Wt 184 lb (83.462 kg)  BMI 30.62 kg/m2  SpO2 97% Physical Exam  Constitutional: He is oriented to person, place, and time. He appears well-developed and well-nourished.  HENT:  Head: Normocephalic and atraumatic.  Eyes: Conjunctivae are normal. Right eye exhibits no discharge. Left eye exhibits no discharge.  Neck: Normal range of motion. Neck supple. No tracheal deviation present.  Cardiovascular: Normal rate and regular rhythm.   Pulmonary/Chest: Effort normal and breath sounds normal.  Abdominal: Soft. He exhibits no distension. There is tenderness (minimal left lower flank no hernia appreciated). There is no guarding.  Musculoskeletal: He  exhibits no edema.  Neurological: He is alert and oriented to person, place, and time.  Skin: Skin is warm. No rash noted.  Psychiatric: He has a normal mood and affect.  Nursing note and vitals reviewed.   ED Course  Procedures (including critical care time) Emergency Focused Ultrasound Exam Limited retroperitoneal ultrasound of kidneys  Performed and interpreted by Dr. Reather Converse Indication: flank pain Focused abdominal ultrasound with both kidneys imaged in transverse and longitudinal planes in real-time. Interpretation: severe left hydronephrosis visualized.   Images archived electronically  CPT Code: [phone] (limited retroperitoneal)  Labs Review Labs Reviewed  URINALYSIS, ROUTINE W REFLEX MICROSCOPIC (NOT AT [facility]) - Abnormal; Notable for the following:    Hgb urine dipstick LARGE (*)    Protein, ur 100 (*)    All other components within normal limits  URINE MICROSCOPIC-ADD ON - Abnormal; Notable for the following:    Bacteria, UA FEW (*)    All other components within normal limits  CBC WITH DIFFERENTIAL/PLATELET  COMPREHENSIVE METABOLIC PANEL  LIPASE, BLOOD    Imaging Review No results found. I have  personally reviewed and evaluated these images and lab results as part of my medical decision-making.   EKG Interpretation None      MDM   Final diagnoses:  Kidney stone on left side  Left flank pain  Hydronephrosis of left kidney   Patient presents with recurrent flank pain for almost a year worsening the past weekend. Bedside ultrasound severe hydronephrosis. CT ordered for further details. No signs infection clinically or in urinalysis. Patient's pain controlled in the ER. Discussed the case with on-call urologist Dr. Louis Meckel who will have Dr. Jeffie Pollock follow the patient in the clinic in 1-2 weeks.  Results and differential diagnosis were discussed with the patient/parent/guardian. Xrays were independently reviewed by myself.  Close follow up outpatient was  discussed, comfortable with the plan.   Medications  sodium chloride 0.9 % bolus 500 mL (0 mLs Intravenous Stopped 12/05/15 1122)    Filed Vitals:   12/05/15 1040 12/05/15 1045 12/05/15 1100 12/05/15 1130  BP:   138/72   Pulse: 65 69 65 65  Temp:      TempSrc:      Resp: 19 19    Height:      Weight:      SpO2: 96% 96% 97% 97%    Final diagnoses:  Kidney stone on left side  Left flank pain  Hydronephrosis of left kidney  Hyperkalemia       Elnora Morrison, MD 12/05/15 1139

## 2015-12-06 ENCOUNTER — Encounter (HOSPITAL_COMMUNITY)
Admission: RE | Admit: 2015-12-06 | Discharge: 2015-12-06 | Disposition: A | Discharge status: Home / Self Care | Payer: Medicare Other | Source: Ambulatory Visit | Attending: Cardiology | Admitting: Cardiology

## 2015-12-06 DIAGNOSIS — Z951 Presence of aortocoronary bypass graft: Secondary | ICD-10-CM

## 2015-12-07 DIAGNOSIS — N132 Hydronephrosis with renal and ureteral calculous obstruction: Secondary | ICD-10-CM | POA: Diagnosis not present

## 2015-12-07 DIAGNOSIS — N289 Disorder of kidney and ureter, unspecified: Secondary | ICD-10-CM | POA: Diagnosis not present

## 2015-12-07 DIAGNOSIS — Z0000 Encounter for general adult medical examination without abnormal findings: Secondary | ICD-10-CM | POA: Diagnosis not present

## 2015-12-07 DIAGNOSIS — N202 Calculus of kidney with calculus of ureter: Secondary | ICD-10-CM | POA: Diagnosis not present

## 2015-12-08 ENCOUNTER — Encounter (HOSPITAL_COMMUNITY)
Admission: RE | Admit: 2015-12-08 | Discharge: 2015-12-08 | Disposition: A | Discharge status: Home / Self Care | Payer: Medicare Other | Source: Ambulatory Visit | Attending: Cardiology | Admitting: Cardiology

## 2015-12-08 DIAGNOSIS — Z951 Presence of aortocoronary bypass graft: Secondary | ICD-10-CM

## 2015-12-11 ENCOUNTER — Encounter (HOSPITAL_COMMUNITY)
Admission: RE | Admit: 2015-12-11 | Discharge: 2015-12-11 | Disposition: A | Discharge status: Home / Self Care | Payer: Medicare Other | Source: Ambulatory Visit | Attending: Cardiology | Admitting: Cardiology

## 2015-12-11 DIAGNOSIS — Z951 Presence of aortocoronary bypass graft: Secondary | ICD-10-CM | POA: Diagnosis not present

## 2015-12-12 ENCOUNTER — Ambulatory Visit (INDEPENDENT_AMBULATORY_CARE_PROVIDER_SITE_OTHER): Payer: Medicare Other | Admitting: Cardiology

## 2015-12-12 ENCOUNTER — Encounter: Payer: Self-pay | Admitting: Cardiology

## 2015-12-12 VITALS — BP 148/78 | HR 64 | Ht 65.0 in | Wt 183.1 lb

## 2015-12-12 DIAGNOSIS — I2510 Atherosclerotic heart disease of native coronary artery without angina pectoris: Secondary | ICD-10-CM | POA: Diagnosis not present

## 2015-12-12 DIAGNOSIS — I10 Essential (primary) hypertension: Secondary | ICD-10-CM

## 2015-12-12 DIAGNOSIS — E785 Hyperlipidemia, unspecified: Secondary | ICD-10-CM | POA: Diagnosis not present

## 2015-12-12 MED ORDER — AMLODIPINE BESYLATE 5 MG PO TABS: 5.0000 mg | ORAL_TABLET | Freq: Every day | ORAL | Status: DC

## 2015-12-12 NOTE — Patient Instructions (Addendum)
Medication Instructions:  1) INCREASE AMLODIPINE to 5 mg daily  Labwork: None  Testing/Procedures: None  Follow-Up: Your physician wants you to follow-up in: 6 months with Dr. Radford Pax. You will receive a reminder letter in the mail two months in advance. If you don't receive a letter, please call our office to schedule the follow-up appointment.   Any Other Special Instructions Will Be Listed Below (If Applicable).     If you need a refill on your cardiac medications before your next appointment, please call your pharmacy.

## 2015-12-12 NOTE — Progress Notes (Signed)
Cardiology Office Note    Date:  12/12/2015   ID:  Jeffrey Holmes, DOB 07-05-1951, MRN [id number]  PCP:  Nanci Pina, FNP  Cardiologist:  Fransico Him, MD   Chief Complaint  Patient presents with  . Coronary Artery Disease  . Hypertension  . Hyperlipidemia    History of Present Illness:  Jeffrey Holmes is a 65 y.o. male with a history of severe multivessel CAD s/p hybrid procedure with CABG with LIMA to LAD/Diag and PCI of the RCA.  He also has a history of HTN, dyslipidemia, CKD stage 3.  He is doing well today.  He denies any chest pain, SOB, DOE, LE edema, dizziness, palpitations or syncope.      Past Medical History  Diagnosis Date  . Chronic kidney disease, stage 3     multiple kidney stones  . H/O hiatal hernia   . Benign essential HTN 08/08/2015  . Hyperlipidemia 08/08/2015  . History of acute renal failure 2015    secondary to stones  . CAD (coronary artery disease), native coronary artery 08/28/2015    Severe multivessel ASCAD s/p CABG hybrid with LIMA to LAD and diag and PCI of the RCA.     Past Surgical History  Procedure Laterality Date  . Back surgery  2006    microdisectomy  . Lithotripsy      2-3 times in past  . Cystoscopy with retrograde pyelogram, ureteroscopy and stent placement Right 01/06/2014    Procedure: CYSTOSCOPY WITH RETROGRADE PYELOGRAM, URETEROSCOPY AND STENT PLACEMENT;  Surgeon: Bernestine Amass, MD;  Location: [facility];  Service: Urology;  Laterality: Right;  . Cardiovascular stress test  08/15/2015  . Cardiac catheterization N/A 08/18/2015    Procedure: Left Heart Cath and Coronary Angiography;  Surgeon: Jettie Booze, MD;  Location: [place] [facility];  Service: Cardiovascular;  Laterality: N/A;  . Tee without cardioversion N/A 08/21/2015    Procedure: TRANSESOPHAGEAL ECHOCARDIOGRAM (TEE);  Surgeon: [name], MD;  Location: [place];  Service: Open Heart Surgery;  Laterality: N/A;  . Coronary artery bypass graft N/A 08/21/2015   Procedure: OFF PUMP CORONARY ARTERY BYPASS GRAFTING (CABG), TIMES TWO, USING LEFT INTERNAL MAMMARY ARTERY, RIGHT GREATER SAPHENOUS VEIN HARVESTED ENDOSCOPICALLY;  Surgeon: [name], MD;  Location: [place];  Service: Open Heart Surgery;  Laterality: N/A;  . Angioplasty N/A 08/21/2015    Procedure: ANGIOPLASTY WITH PERCUTANEOUS CORONARY INTERVENTION WITH DES TO RIGHT CORONARY ARTERY;  Surgeon: Jettie Booze, MD;  Location: [place];  Service: Cardiovascular;  Laterality: N/A;  . Cardiac catheterization N/A 08/21/2015    Procedure: Coronary Stent Intervention;  Surgeon: Jettie Booze, MD;  Location: [place] [facility];  Service: Cardiovascular;  Laterality: N/A;  . Cardiac catheterization  08/21/2015    Procedure: Bypass Graft Angiography;  Surgeon: Jettie Booze, MD;  Location: [place] [facility];  Service: Cardiovascular;;    Current Medications: Outpatient Prescriptions Prior to Visit  Medication Sig Dispense Refill  . amLODipine (NORVASC) 2.5 MG tablet Take 2 tablets (5 mg total) by mouth daily. 180 tablet 3  . aspirin EC 81 MG tablet Take 1 tablet (81 mg total) by mouth daily.    [name] atorvastatin (LIPITOR) 80 MG tablet Take 1 tablet (80 mg total) by mouth daily. 90 tablet 3  . clopidogrel (PLAVIX) 75 MG tablet Take 1 tablet (75 mg total) by mouth daily. 30 tablet 3  . nitroGLYCERIN (NITROSTAT) 0.4 MG SL tablet Place 0.4 mg under the  tongue as needed. Place 1 tablet under your tongue every 5 minutes as needed for chest pain for a max of 3 doses    . timolol (TIMOPTIC) 0.5 % ophthalmic solution Place 1 drop into both eyes 2 (two) times daily.     . travoprost, benzalkonium, (TRAVATAN) 0.004 % ophthalmic solution Place 1 drop into both eyes at bedtime.    . metoprolol (LOPRESSOR) 50 MG tablet Take 1 tablet (50 mg total) by mouth 2 (two) times daily. 180 tablet 3  . metoprolol tartrate (LOPRESSOR) 25 MG tablet Take 25 mg by mouth 2 (two) times daily. Reported on 12/12/2015     No  facility-administered medications prior to visit.     Allergies:   Vicodin; Hydrocodone; Oxycontin; and Percocet   Social History   Social History  . Marital Status: Married    Spouse Name: N/A  . Number of Children: N/A  . Years of Education: N/A   Social History Main Topics  . Smoking status: Never Smoker   . Smokeless tobacco: Never Used  . Alcohol Use: Yes     Comment: occasional every 2-3 months  . Drug Use: No  . Sexual Activity: Not Asked   Other Topics Concern  . None   Social History Narrative     Family History:  The patient's family history includes Heart attack in his mother; Kidney failure in his mother; Throat cancer in his father.   ROS:   Please see the history of present illness.    ROS All other systems reviewed and are negative.   PHYSICAL EXAM:   VS:  BP 148/78 mmHg  Pulse 64  Ht 5\' 5"  (1.651 m)  Wt 183 lb 1.9 oz (83.063 kg)  BMI 30.47 kg/m2  SpO2 97%   GEN: Well nourished, well developed, in no acute distress HEENT: normal Neck: no JVD, carotid bruits, or masses Cardiac: RRR; no murmurs, rubs, or gallops,no edema.  Intact distal pulses bilaterally.  Respiratory:  clear to auscultation bilaterally, normal work of breathing GI: soft, nontender, nondistended, + BS MS: no deformity or atrophy Skin: warm and dry, no rash Neuro:  Alert and Oriented x 3, Strength and sensation are intact Psych: euthymic mood, full affect  Wt Readings from Last 3 Encounters:  12/12/15 183 lb 1.9 oz (83.063 kg)  12/05/15 184 lb (83.462 kg)  11/03/15 185 lb 1.9 oz (83.97 kg)      Studies/Labs Reviewed:   EKG:  EKG is not ordered today.   Recent Labs: 08/17/2015: TSH 3.479 08/22/2015: Magnesium 2.6* 12/05/2015: ALT 25; BUN 16; Creatinine, Ser 2.01*; Hemoglobin 15.6; Platelets 246; Potassium 5.3*; Sodium 145   Lipid Panel    Component Value Date/Time   CHOL 183 11/03/2015 1358   TRIG 238* 11/03/2015 1358   HDL 41 11/03/2015 1358   CHOLHDL 4.5 11/03/2015  1358   VLDL 48* 11/03/2015 1358   LDLCALC 94 11/03/2015 1358    Additional studies/ records that were reviewed today include:  none    ASSESSMENT:    1. Coronary artery disease involving native coronary artery of native heart without angina pectoris   2. Benign essential HTN   3. Hyperlipidemia      PLAN:  In order of problems listed above:  1. ASCAD s/p hybrid CABG with LIMA to LAD/Diag and PCI of RCA - doing well with no angina. Continue ASA/Plavix/statin/BB.   2. HTN - Bp borderline controlled on current medical regimen.  Continue amlodipine/BB.  Increase amlodipine to 5mg  daily.  3. Dyslipidemia - continue statin.  Continue statin.  Statin dose recently increased due to LDL 94 and repeat lipids pending next week.     Medication Adjustments/Labs and Tests Ordered: Current medicines are reviewed at length with the patient today.  Concerns regarding medicines are outlined above.  Medication changes, Labs and Tests ordered today are listed in the Patient Instructions below.  There are no Patient Instructions on file for this visit.   Signed, Fransico Him, MD  12/12/2015 8:40 AM    [facility] [place] [address], [place], [place]  [address] Phone: [phone]; Fax: [phone]

## 2015-12-12 NOTE — Addendum Note (Signed)
Addended by: Harland German A on: 12/12/2015 09:01 AM   Modules accepted: Orders

## 2015-12-13 ENCOUNTER — Encounter (HOSPITAL_COMMUNITY)
Admission: RE | Admit: 2015-12-13 | Discharge: 2015-12-13 | Disposition: A | Discharge status: Home / Self Care | Payer: Medicare Other | Source: Ambulatory Visit | Attending: Cardiology | Admitting: Cardiology

## 2015-12-13 DIAGNOSIS — Z951 Presence of aortocoronary bypass graft: Secondary | ICD-10-CM | POA: Diagnosis not present

## 2015-12-15 ENCOUNTER — Encounter (HOSPITAL_COMMUNITY)
Admission: RE | Admit: 2015-12-15 | Discharge: 2015-12-15 | Disposition: A | Discharge status: Home / Self Care | Payer: Medicare Other | Source: Ambulatory Visit | Attending: Cardiology | Admitting: Cardiology

## 2015-12-15 DIAGNOSIS — Z951 Presence of aortocoronary bypass graft: Secondary | ICD-10-CM | POA: Diagnosis not present

## 2015-12-15 NOTE — Progress Notes (Signed)
Pt hypertensive during exercise.  Peak BP:  186/80.  Pt asymptomatic. Pt notes eating A-1 steaksauce last night and [employer] hashbrowns this morning. This is not pt usual sodium intake. Pt denies missed medication doses.  Low sodium diet instructions given to pt. Pt advised to consume high sodium foods in moderation.  Pt verbalized understanding.  Will continue to monitor.

## 2015-12-15 NOTE — Progress Notes (Signed)
Cardiac Individual Treatment Plan  Patient Details  Name: Jeffrey Holmes MRN: [id number] Date of Birth: 1951/05/20 Referring Provider:        CARDIAC REHAB PHASE II EXERCISE from 11/08/2015 in [place]   Referring Provider  Fransico Him MD      Initial Encounter Date:       CARDIAC REHAB PHASE II EXERCISE from 11/08/2015 in [place]   Date  10/12/15   Referring Provider  Fransico Him MD      Visit Diagnosis: S/P CABG x 2  Patient's Home Medications on Admission:  Current outpatient prescriptions:  .  amLODipine (NORVASC) 5 MG tablet, Take 1 tablet (5 mg total) by mouth daily., Disp: 90 tablet, Rfl: 3 .  aspirin EC 81 MG tablet, Take 1 tablet (81 mg total) by mouth daily., Disp: , Rfl:  .  atorvastatin (LIPITOR) 80 MG tablet, Take 1 tablet (80 mg total) by mouth daily., Disp: 90 tablet, Rfl: 3 .  clopidogrel (PLAVIX) 75 MG tablet, Take 1 tablet (75 mg total) by mouth daily., Disp: 30 tablet, Rfl: 3 .  metoprolol succinate (TOPROL-XL) 25 MG 24 hr tablet, Take 25 mg by mouth 2 (two) times daily., Disp: , Rfl:  .  nitroGLYCERIN (NITROSTAT) 0.4 MG SL tablet, Place 0.4 mg under the tongue as needed. Place 1 tablet under your tongue every 5 minutes as needed for chest pain for a max of 3 doses, Disp: , Rfl:  .  timolol (TIMOPTIC) 0.5 % ophthalmic solution, Place 1 drop into both eyes 2 (two) times daily. , Disp: , Rfl:  .  travoprost, benzalkonium, (TRAVATAN) 0.004 % ophthalmic solution, Place 1 drop into both eyes at bedtime., Disp: , Rfl:   Past Medical History: Past Medical History  Diagnosis Date  . Chronic kidney disease, stage 3     multiple kidney stones  . H/O hiatal hernia   . Benign essential HTN 08/08/2015  . Hyperlipidemia 08/08/2015  . History of acute renal failure 2015    secondary to stones  . CAD (coronary artery disease), native coronary artery 08/28/2015    Severe multivessel ASCAD s/p CABG hybrid with  LIMA to LAD and diag and PCI of the RCA.     Tobacco Use: History  Smoking status  . Never Smoker   Smokeless tobacco  . Never Used    Labs:     Recent Review Flowsheet Data    Labs for ITP Cardiac and Pulmonary Rehab Latest Ref Rng 08/21/2015 08/22/2015 08/22/2015 09/19/2015 11/03/2015   Cholestrol 125 - 200 mg/dL - - - 227(H) 183   LDLCALC <130 mg/dL - - - 150(H) 94   HDL >=40 mg/dL - - - 34(L) 41   Trlycerides <150 mg/dL - - - 217(H) 238(H)   PHART 7.350 - 7.450 7.349(L) 7.329(L) - - -   PCO2ART 35.0 - 45.0 mmHg 36.3 37.7 - - -   HCO3 20.0 - 24.0 mEq/L 20.0 20.0 - - -   TCO2 0 - 100 mmol/L _0 - -   ACIDBASEDEF 0.0 - 2.0 mmol/L 5.0(H) 6.0(H) - - -   O2SAT - 96.0 94.0 - - -      Capillary Blood Glucose: Lab Results  Component Value Date   GLUCAP 128* 08/25/2015   GLUCAP 120* 08/25/2015   GLUCAP 111* 08/24/2015   GLUCAP 100* 08/24/2015   GLUCAP 111* 08/24/2015     Exercise Target Goals:    Exercise Program  Goal: Individual exercise prescription set with THRR, safety & activity barriers. Participant demonstrates ability to understand and report RPE using BORG scale, to self-measure pulse accurately, and to acknowledge the importance of the exercise prescription.  Exercise Prescription Goal: Starting with aerobic activity 30 plus minutes a day, 3 days per week for initial exercise prescription. Provide home exercise prescription and guidelines that participant acknowledges understanding prior to discharge.  Activity Barriers & Risk Stratification:     Activity Barriers & Cardiac Risk Stratification - 10/12/15 1549    Activity Barriers & Cardiac Risk Stratification   Activity Barriers None      6 Minute Walk:     6 Minute Walk      10/12/15 1034       6 Minute Walk   Phase Initial     Distance 1683 feet     Walk Time 6 minutes     MPH 3.19     METS 3.69     RPE 9     Perceived Dyspnea  0     VO2 Peak 12.93     Symptoms No     Resting HR 76 bpm      Resting BP 142/78 mmHg     Max Ex. HR 98 bpm     Max Ex. BP 136/84 mmHg     2 Minute Post BP 134/84 mmHg     Pre/Post BP   Baseline BP 142/78 mmHg     6 Minute BP 136/84 mmHg     Pre/Post BP? Yes        Initial Exercise Prescription:     Initial Exercise Prescription - 11/09/15 1400    Date of Initial Exercise RX and Referring Provider   Date 10/12/15   Referring Provider Fransico Him MD   Treadmill   MPH 3   Grade 1   Minutes 10   METs 3.7   Bike   Level 1.2   Minutes 10   METs 3.71   NuStep   Level 2   Minutes 10   METs 3.4   Resistance Training   Training Prescription Yes   Weight 4 lbs   Reps 10-12      Perform Capillary Blood Glucose checks as needed.  Exercise Prescription Changes:      Exercise Prescription Changes      10/24/15 1200 11/03/15 0900 11/15/15 1100 11/23/15 1100 11/30/15 1600   Exercise Review   Progression Yes  Yes Yes Yes   Response to Exercise   Blood Pressure (Admit) 142/82 mmHg  124/66 mmHg 120/70 mmHg 124/64 mmHg   Blood Pressure (Exercise) 164/80 mmHg  138/80 mmHg 144/80 mmHg 130/80 mmHg   Blood Pressure (Exit) 148/86 mmHg  124/60 mmHg 104/60 mmHg 122/72 mmHg   Heart Rate (Admit) 88 bpm  65 bpm 63 bpm 74 bpm   Heart Rate (Exercise) 111 bpm  93 bpm 89 bpm 111 bpm   Heart Rate (Exit) 83 bpm  62 bpm 61 bpm 71 bpm   Rating of Perceived Exertion (Exercise) _0 Comments Home Exercise Given on 10/23/15       Duration Progress to 30 minutes of continuous aerobic without signs/symptoms of physical distress  Progress to 30 minutes of continuous aerobic without signs/symptoms of physical distress Progress to 30 minutes of continuous aerobic without signs/symptoms of physical distress Progress to 30 minutes of continuous aerobic without signs/symptoms of physical distress   Intensity THRR unchanged  THRR unchanged THRR unchanged THRR  unchanged   Progression   Progression Continue to progress workloads to maintain intensity  without signs/symptoms of physical distress.  Continue to progress workloads to maintain intensity without signs/symptoms of physical distress. Continue to progress workloads to maintain intensity without signs/symptoms of physical distress. Continue to progress workloads to maintain intensity without signs/symptoms of physical distress.   Average METs 3.1 3.8 4.1  4.9   Resistance Training   Training Prescription Yes  Yes Yes Yes   Weight 4 lbs  4LBS 4lbs 4lbs   Reps 10-12  10-12 10-12 10-12   Interval Training   Interval Training No       Treadmill   MPH _0 Grade _1 Minutes _2 METs 3.7  4.95 4.95 4.95   Bike   Level 1.2  1.2 1.5 1.5   Minutes _3 METs 3.71  3.74 4.39 4.39   NuStep   Level _4 Minutes _5 METs 3.4  3.3 4.5 5   Home Exercise Plan   Plans to continue exercise at Home  walking       Frequency Add 2 additional days to program exercise sessions.         12/15/15 1200           Exercise Review   Progression Yes       Response to Exercise   Blood Pressure (Admit) 120/80 mmHg       Blood Pressure (Exercise) 162/80 mmHg       Blood Pressure (Exit) 118/62 mmHg       Heart Rate (Admit) 72 bpm       Heart Rate (Exercise) 110 bpm       Heart Rate (Exit) 70 bpm       Rating of Perceived Exertion (Exercise) 10       Duration Progress to 30 minutes of continuous aerobic without signs/symptoms of physical distress       Intensity THRR unchanged       Progression   Progression Continue to progress workloads to maintain intensity without signs/symptoms of physical distress.       Average METs 5.3       Resistance Training   Training Prescription Yes       Weight 4lbs       Reps 10-12       Treadmill   MPH 3.3       Grade 4       Minutes 10       METs 5.35       Bike   Level 1.5       Minutes 10       METs 4.39       NuStep   Level 5       Minutes 10       METs 5.2          Exercise Comments:       Exercise Comments      10/24/15 1233 11/15/15 1115 11/23/15 1116 12/15/15 1241     Exercise Comments Pt is tolerating exercise well.  Will continue to monitor for exercise progression. Reviewed METs and goals, will continue to monitor exercise progression and weightloss.  pt is tolerating exercise well, will continue to monitor exercise progression pt is tolerating exercise well, will continue to monitor exercise progression  Discharge Exercise Prescription (Final Exercise Prescription Changes):     Exercise Prescription Changes - 12/15/15 1200    Exercise Review   Progression Yes   Response to Exercise   Blood Pressure (Admit) 120/80 mmHg   Blood Pressure (Exercise) 162/80 mmHg   Blood Pressure (Exit) 118/62 mmHg   Heart Rate (Admit) 72 bpm   Heart Rate (Exercise) 110 bpm   Heart Rate (Exit) 70 bpm   Rating of Perceived Exertion (Exercise) 10   Duration Progress to 30 minutes of continuous aerobic without signs/symptoms of physical distress   Intensity THRR unchanged   Progression   Progression Continue to progress workloads to maintain intensity without signs/symptoms of physical distress.   Average METs 5.3   Resistance Training   Training Prescription Yes   Weight 4lbs   Reps 10-12   Treadmill   MPH 3.3   Grade 4   Minutes 10   METs 5.35   Bike   Level 1.5   Minutes 10   METs 4.39   NuStep   Level 5   Minutes 10   METs 5.2      Nutrition:  Target Goals: Understanding of nutrition guidelines, daily intake of sodium <152m, cholesterol <2062m calories 30% from fat and 7% or less from saturated fats, daily to have 5 or more servings of fruits and vegetables.  Biometrics:     Pre Biometrics - 10/12/15 1210    Pre Biometrics   Height 5' 6.25" (1.683 m)   Weight 182 lb 1.6 oz (82.6 kg)   Waist Circumference 39 inches   Hip Circumference 39 inches   Waist to Hip Ratio 1 %   BMI (Calculated) 29.2   Triceps Skinfold 13 mm   % Body Fat 27.2 %   Grip  Strength 46 kg   Flexibility 7.5 in   Single Leg Stand 7.3 seconds       Nutrition Therapy Plan and Nutrition Goals:     Nutrition Therapy & Goals - 10/12/15 1521    Nutrition Therapy   Diet Therapeutic Lifestyle Changes, Consistent CHO   Personal Nutrition Goals   Personal Goal #1 6-20 lb wt loss at graduation from Cardiac Rehab   Personal Goal #2 Basic understanding of diabetic diet principles (A1c 6.9)   Intervention Plan   Intervention Prescribe, educate and counsel regarding individualized specific dietary modifications aiming towards targeted core components such as weight, hypertension, lipid management, diabetes, heart failure and other comorbidities.   Expected Outcomes Short Term Goal: Understand basic principles of dietary content, such as calories, fat, sodium, cholesterol and nutrients.;Long Term Goal: Adherence to prescribed nutrition plan.      Nutrition Discharge: Nutrition Scores:     Nutrition Assessments - 10/25/15 1047    MEDFICTS Scores   Pre Score 48      Nutrition Goals Re-Evaluation:     Nutrition Goals Re-Evaluation      10/25/15 1022           Personal Goal #1 Re-Evaluation   Personal Goal #1 6-20 lb wt loss at graduation from Cardiac Rehab       Goal Progress Seen No       Comments Pt knows he needs to limit sugary beverages (soda, lemonade, sweet tea) to help promote wt loss.       Personal Goal #2 Re-Evaluation   Personal Goal #2 Basic understanding of diabetic diet principles (A1c 6.9)       Goal Progress Seen Yes  Comments Pt aware of the need to limit sugary beverages to help with blood sugar control. Pt in the contemplative state of change.        Intervention Plan   Intervention Continue to educate, counsel and set short/long term goals regarding individualized specific personal dietary modifications.;Nutrition handout(s) given to patient.       Comments Type 2 DM handout provided for pt to review           Psychosocial: Target Goals: Acknowledge presence or absence of depression, maximize coping skills, provide positive support system. Participant is able to verbalize types and ability to use techniques and skills needed for reducing stress and depression.  Initial Review & Psychosocial Screening:     Initial Psych Review & Screening - 10/16/15 [place]? Yes   Barriers   Psychosocial barriers to participate in program There are no identifiable barriers or psychosocial needs.      Quality of Life Scores:     Quality of Life - 10/25/15 1044    Quality of Life Scores   GLOBAL Post --  quality of life scores reviewed with patient.  pt demononstrates good coping skills with positive outlook and family support.        PHQ-9:     Recent Review Flowsheet Data    Depression screen [facility] 2/9 10/16/2015   Decreased Interest 0   Down, Depressed, Hopeless 0   PHQ - 2 Score 0      Psychosocial Evaluation and Intervention:     Psychosocial Evaluation - 12/13/15 1105    Psychosocial Evaluation & Interventions   Interventions Stress management education;Encouraged to exercise with the program and follow exercise prescription;Relaxation education   Continued Psychosocial Services Needed Yes      Psychosocial Re-Evaluation:     Psychosocial Re-Evaluation      10/24/15 1641 11/15/15 0851 12/11/15 0850 12/13/15 1105     Psychosocial Re-Evaluation   Interventions Stress management education;Relaxation education;Encouraged to attend Cardiac Rehabilitation for the exercise Stress management education;Encouraged to attend Cardiac Rehabilitation for the exercise Stress management education;Encouraged to attend Cardiac Rehabilitation for the exercise Stress management education;Relaxation education;Encouraged to attend Cardiac Rehabilitation for the exercise    Comments  no psychosocial concerns, stress management and exercise encouraged for overall  cardiac health benefit  no psychosocial concerns, stress management and exercise encouraged for overall cardiac health benefit    Continued Psychosocial Services Needed Yes Yes Yes Yes       Vocational Rehabilitation: Provide vocational rehab assistance to qualifying candidates.   Vocational Rehab Evaluation & Intervention:     Vocational Rehab - 10/18/15 0912    Initial Vocational Rehab Evaluation & Intervention   Assessment shows need for Vocational Rehabilitation No  pt inadvertantly checked yes on voc rehab form.  pt is retired.      Education: Education Goals: Education classes will be provided on a weekly basis, covering required topics. Participant will state understanding/return demonstration of topics presented.  Learning Barriers/Preferences:     Learning Barriers/Preferences - 10/12/15 4469    Learning Barriers/Preferences   Learning Barriers Sight  reading glasses   Learning Preferences Group Instruction;Individual Instruction;Verbal Instruction;Written Material      Education Topics: Count Your Pulse:  -Group instruction provided by verbal instruction, demonstration, patient participation and written materials to support subject.  Instructors address importance of being able to find your pulse and how to count your pulse when at home without a heart monitor.  Patients get hands on experience counting their pulse with staff help and individually.      CARDIAC REHAB PHASE II EXERCISE from 12/13/2015 in [place]   Date  11/03/15   Educator  J   Instruction Review Code  R- Review/reinforce      Heart Attack, Angina, and Risk Factor Modification:  -Group instruction provided by verbal instruction, video, and written materials to support subject.  Instructors address signs and symptoms of angina and heart attacks.    Also discuss risk factors for heart disease and how to make changes to improve heart health risk factors.      CARDIAC  REHAB PHASE II EXERCISE from 12/13/2015 in [place]   Date  11/29/15   Instruction Review Code  2- meets goals/outcomes      Functional Fitness:  -Group instruction provided by verbal instruction, demonstration, patient participation, and written materials to support subject.  Instructors address safety measures for doing things around the house.  Discuss how to get up and down off the floor, how to pick things up properly, how to safely get out of a chair without assistance, and balance training.      CARDIAC REHAB PHASE II EXERCISE from 12/13/2015 in [place]   Date  11/17/15 [Second Time for class]   Educator  Luetta Nutting [place]   Instruction Review Code  R- Review/reinforce      Meditation and Mindfulness:  -Group instruction provided by verbal instruction, patient participation, and written materials to support subject.  Instructor addresses importance of mindfulness and meditation practice to help reduce stress and improve awareness.  Instructor also leads participants through a meditation exercise.    Stretching for Flexibility and Mobility:  -Group instruction provided by verbal instruction, patient participation, and written materials to support subject.  Instructors lead participants through series of stretches that are designed to increase flexibility thus improving mobility.  These stretches are additional exercise for major muscle groups that are typically performed during regular warm up and cool down.      CARDIAC REHAB PHASE II EXERCISE from 12/13/2015 in [place]   Date  11/10/15   Educator  [place]   Instruction Review Code  2- meets goals/outcomes      Hands Only CPR Anytime:  -Group instruction provided by verbal instruction, video, patient participation and written materials to support subject.  Instructors co-teach with AHA video for hands only CPR.  Participants get hands  on experience with mannequins.   Nutrition I class: Heart Healthy Eating:  -Group instruction provided by PowerPoint slides, verbal discussion, and written materials to support subject matter. The instructor gives an explanation and review of the Therapeutic Lifestyle Changes diet recommendations, which includes a discussion on lipid goals, dietary fat, sodium, fiber, plant stanol/sterol esters, sugar, and the components of a well-balanced, healthy diet.      CARDIAC REHAB PHASE II EXERCISE from 12/13/2015 in [place]   Date  10/25/15   Educator  RD   Instruction Review Code  Not applicable [handout given]      Nutrition II class: Lifestyle Skills:  -Group instruction provided by PowerPoint slides, verbal discussion, and written materials to support subject matter. The instructor gives an explanation and review of label reading, grocery shopping for heart health, heart healthy recipe modifications, and ways to make healthier choices when eating out.      CARDIAC  REHAB PHASE II EXERCISE from 12/13/2015 in [place]   Date  10/25/15   Educator  RD   Instruction Review Code  Not applicable [Handout given]      Diabetes Question & Answer:  -Group instruction provided by PowerPoint slides, verbal discussion, and written materials to support subject matter. The instructor gives an explanation and review of diabetes co-morbidities, pre- and post-prandial blood glucose goals, pre-exercise blood glucose goals, signs, symptoms, and treatment of hypoglycemia and hyperglycemia, and foot care basics.   Diabetes Blitz:  -Group instruction provided by PowerPoint slides, verbal discussion, and written materials to support subject matter. The instructor gives an explanation and review of the physiology behind type 1 and type 2 diabetes, diabetes medications and rational behind using different medications, pre- and post-prandial blood glucose  recommendations and Hemoglobin A1c goals, diabetes diet, and exercise including blood glucose guidelines for exercising safely.    Portion Distortion:  -Group instruction provided by PowerPoint slides, verbal discussion, written materials, and food models to support subject matter. The instructor gives an explanation of serving size versus portion size, changes in portions sizes over the last 20 years, and what consists of a serving from each food group.      CARDIAC REHAB PHASE II EXERCISE from 12/13/2015 in [place]   Date  11/22/15   Educator  RD   Instruction Review Code  2- meets goals/outcomes      Stress Management:  -Group instruction provided by verbal instruction, video, and written materials to support subject matter.  Instructors review role of stress in heart disease and how to cope with stress positively.        CARDIAC REHAB PHASE II EXERCISE from 12/13/2015 in [place]   Date  11/15/15   Instruction Review Code  2- meets goals/outcomes      Exercising on Your Own:  -Group instruction provided by verbal instruction, power point, and written materials to support subject.  Instructors discuss benefits of exercise, components of exercise, frequency and intensity of exercise, and end points for exercise.  Also discuss use of nitroglycerin and activating EMS.  Review options of places to exercise outside of rehab.  Review guidelines for sex with heart disease.      CARDIAC REHAB PHASE II EXERCISE from 12/13/2015 in [place]   Date  11/01/15   Educator  [place]   Instruction Review Code  2- meets goals/outcomes      Cardiac Drugs I:  -Group instruction provided by verbal instruction and written materials to support subject.  Instructor reviews cardiac drug classes: antiplatelets, anticoagulants, beta blockers, and statins.  Instructor discusses reasons, side effects, and  lifestyle considerations for each drug class.      CARDIAC REHAB PHASE II EXERCISE from 12/13/2015 in [place]   Date  11/08/15   Educator  Clydia Llano   Instruction Review Code  2- meets goals/outcomes      Cardiac Drugs II:  -Group instruction provided by verbal instruction and written materials to support subject.  Instructor reviews cardiac drug classes: angiotensin converting enzyme inhibitors (ACE-I), angiotensin II receptor blockers (ARBs), nitrates, and calcium channel blockers.  Instructor discusses reasons, side effects, and lifestyle considerations for each drug class.      CARDIAC REHAB PHASE II EXERCISE from 12/13/2015 in [place]   Date  12/06/15   Educator  Kennyth Lose  Orie Fisherman D   Instruction Review Code  R- Review/reinforce [Second Class]      Anatomy and Physiology of the Circulatory System:  -Group instruction provided by verbal instruction, video, and written materials to support subject.  Reviews functional anatomy of heart, how it relates to various diagnoses, and what role the heart plays in the overall system.          CARDIAC REHAB PHASE II EXERCISE from 12/13/2015 in [place]   Date  12/13/15   Instruction Review Code  2- meets goals/outcomes      Knowledge Questionnaire Score:     Knowledge Questionnaire Score - 10/18/15 7253    Knowledge Questionnaire Score   Pre Score 88      Core Components/Risk Factors/Patient Goals at Admission:     Personal Goals and Risk Factors at Admission - 11/15/15 1110    Core Components/Risk Factors/Patient Goals on Admission    Weight Management Weight Loss   Intervention Weight Management: Develop a combined nutrition and exercise program designed to reach desired caloric intake, while maintaining appropriate intake of nutrient and fiber, sodium and fats, and appropriate energy expenditure required for the weight goal.    Expected Outcomes Short Term: Continue to assess and modify interventions until short term weight is achieved   Intervention increase exercise tolerance and METs in order to get back to playing tennis   Expected Outcomes Increase exercise capacity, exercise tolerance and confidence with activity. Continue to monitor weightloss.      Core Components/Risk Factors/Patient Goals Review:      Goals and Risk Factor Review      11/03/15 0946 12/15/15 1241 12/15/15 1731       Core Components/Risk Factors/Patient Goals Review   Personal Goals Review Increase Strength and Stamina;Other Other;Weight Management/Obesity Hypertension     Review noticed that breathing quality is better but mild chest discomfort maintaining weightloss and according to fitness level (MET's) is now able to play tennis. pt will try and play over the weekend resting BP improved with medication adjustments, exercise BP elevated with increased workloads and PO sodium consumption.       Expected Outcomes Increased exercise capacity and confidence with exercise. Will continue to monitor exericse progression able to sustain weightloss, be able to play tennis without extreme fatigue or SOB continue lifestyle modifications to control hypertension        Core Components/Risk Factors/Patient Goals at Discharge (Final Review):      Goals and Risk Factor Review - 12/15/15 1731    Core Components/Risk Factors/Patient Goals Review   Personal Goals Review Hypertension   Review resting BP improved with medication adjustments, exercise BP elevated with increased workloads and PO sodium consumption.     Expected Outcomes continue lifestyle modifications to control hypertension      ITP Comments:     ITP Comments      10/12/15 0803           ITP Comments Medical Director-Dr. Fransico Him, MD          Comments: Pt is making expected progress toward personal goals after completing 28 sessions. Recommend continued exercise and  life style modification education including  stress management and relaxation techniques to decrease cardiac risk profile.

## 2015-12-18 ENCOUNTER — Encounter (HOSPITAL_COMMUNITY)
Admission: RE | Admit: 2015-12-18 | Discharge: 2015-12-18 | Disposition: A | Discharge status: Home / Self Care | Payer: Medicare Other | Source: Ambulatory Visit | Attending: Cardiology | Admitting: Cardiology

## 2015-12-18 DIAGNOSIS — Z951 Presence of aortocoronary bypass graft: Secondary | ICD-10-CM | POA: Diagnosis not present

## 2015-12-19 ENCOUNTER — Other Ambulatory Visit: Payer: Medicare Other

## 2015-12-20 ENCOUNTER — Encounter (HOSPITAL_COMMUNITY)
Admission: RE | Admit: 2015-12-20 | Discharge: 2015-12-20 | Disposition: A | Discharge status: Home / Self Care | Payer: Medicare Other | Source: Ambulatory Visit | Attending: Cardiology | Admitting: Cardiology

## 2015-12-20 DIAGNOSIS — Z951 Presence of aortocoronary bypass graft: Secondary | ICD-10-CM

## 2015-12-21 ENCOUNTER — Other Ambulatory Visit (INDEPENDENT_AMBULATORY_CARE_PROVIDER_SITE_OTHER): Payer: Medicare Other | Admitting: *Deleted

## 2015-12-21 DIAGNOSIS — I10 Essential (primary) hypertension: Secondary | ICD-10-CM

## 2015-12-21 DIAGNOSIS — E785 Hyperlipidemia, unspecified: Secondary | ICD-10-CM

## 2015-12-21 LAB — LIPID PANEL
CHOLESTEROL: 175 mg/dL (ref 125–200)
HDL: 42 mg/dL (ref >=40)
LDL Cholesterol: 100 mg/dL (ref <130)
Total CHOL/HDL Ratio: 4.2 Ratio (ref <=5.0)
Triglycerides: 164 mg/dL — ABNORMAL HIGH (ref <150)
VLDL: 33 mg/dL — ABNORMAL HIGH (ref <30)

## 2015-12-21 LAB — ALT: ALT: 21 U/L (ref 9–46)

## 2015-12-22 ENCOUNTER — Encounter (HOSPITAL_COMMUNITY)
Admission: RE | Admit: 2015-12-22 | Discharge: 2015-12-22 | Disposition: A | Discharge status: Home / Self Care | Payer: Medicare Other | Source: Ambulatory Visit | Attending: Cardiology | Admitting: Cardiology

## 2015-12-22 DIAGNOSIS — Z951 Presence of aortocoronary bypass graft: Secondary | ICD-10-CM

## 2015-12-25 ENCOUNTER — Encounter (HOSPITAL_COMMUNITY): Payer: Medicare Other

## 2015-12-27 ENCOUNTER — Encounter (HOSPITAL_COMMUNITY)
Admission: RE | Admit: 2015-12-27 | Discharge: 2015-12-27 | Disposition: A | Discharge status: Home / Self Care | Payer: Medicare Other | Source: Ambulatory Visit | Attending: Cardiology | Admitting: Cardiology

## 2015-12-27 DIAGNOSIS — Z951 Presence of aortocoronary bypass graft: Secondary | ICD-10-CM

## 2015-12-28 ENCOUNTER — Telehealth: Payer: Self-pay

## 2015-12-28 DIAGNOSIS — E785 Hyperlipidemia, unspecified: Secondary | ICD-10-CM

## 2015-12-28 MED ORDER — EZETIMIBE 10 MG PO TABS: 10.0000 mg | ORAL_TABLET | Freq: Every day | ORAL | Status: DC

## 2015-12-28 NOTE — Telephone Encounter (Signed)
-----   Message from Sueanne Margarita, MD sent at 12/21/2015  7:43 PM EDT ----- LDL still not at goal - add zetia 10mg  daily and repeat FLP and ALT in 8 weeks

## 2015-12-28 NOTE — Telephone Encounter (Signed)
Informed patient of results and verbal understanding expressed.  Instructed patient to START ZETIA 10 mg daily. FLP and ALT scheduled August 1. Patient agrees with treatment plan.

## 2015-12-29 ENCOUNTER — Encounter (HOSPITAL_COMMUNITY)
Admission: RE | Admit: 2015-12-29 | Discharge: 2015-12-29 | Disposition: A | Discharge status: Home / Self Care | Payer: Medicare Other | Source: Ambulatory Visit | Attending: Cardiology | Admitting: Cardiology

## 2015-12-29 DIAGNOSIS — Z951 Presence of aortocoronary bypass graft: Secondary | ICD-10-CM | POA: Diagnosis not present

## 2015-12-30 ENCOUNTER — Other Ambulatory Visit: Payer: Self-pay | Admitting: Physician Assistant

## 2016-01-01 ENCOUNTER — Encounter (HOSPITAL_COMMUNITY)
Admission: RE | Admit: 2016-01-01 | Discharge: 2016-01-01 | Disposition: A | Discharge status: Home / Self Care | Payer: Medicare Other | Source: Ambulatory Visit | Attending: Cardiology | Admitting: Cardiology

## 2016-01-01 ENCOUNTER — Other Ambulatory Visit: Payer: Self-pay | Admitting: Cardiology

## 2016-01-01 DIAGNOSIS — Z951 Presence of aortocoronary bypass graft: Secondary | ICD-10-CM | POA: Diagnosis not present

## 2016-01-01 MED ORDER — CLOPIDOGREL BISULFATE 75 MG PO TABS: 75.0000 mg | ORAL_TABLET | Freq: Every day | ORAL | Status: DC

## 2016-01-03 ENCOUNTER — Ambulatory Visit (INDEPENDENT_AMBULATORY_CARE_PROVIDER_SITE_OTHER): Payer: Medicare Other | Admitting: Nurse Practitioner

## 2016-01-03 ENCOUNTER — Telehealth (HOSPITAL_COMMUNITY): Payer: Self-pay | Admitting: Cardiac Rehabilitation

## 2016-01-03 ENCOUNTER — Encounter: Payer: Self-pay | Admitting: Nurse Practitioner

## 2016-01-03 ENCOUNTER — Encounter (HOSPITAL_COMMUNITY)
Admission: RE | Admit: 2016-01-03 | Discharge: 2016-01-03 | Disposition: A | Discharge status: Home / Self Care | Payer: Medicare Other | Source: Ambulatory Visit | Attending: Cardiology | Admitting: Cardiology

## 2016-01-03 VITALS — BP 150/80 | HR 57 | Ht 65.0 in | Wt 185.4 lb

## 2016-01-03 DIAGNOSIS — E785 Hyperlipidemia, unspecified: Secondary | ICD-10-CM

## 2016-01-03 DIAGNOSIS — Z951 Presence of aortocoronary bypass graft: Secondary | ICD-10-CM

## 2016-01-03 DIAGNOSIS — I2510 Atherosclerotic heart disease of native coronary artery without angina pectoris: Secondary | ICD-10-CM | POA: Diagnosis not present

## 2016-01-03 DIAGNOSIS — I10 Essential (primary) hypertension: Secondary | ICD-10-CM

## 2016-01-03 MED ORDER — METOPROLOL SUCCINATE ER 50 MG PO TB24: 50.0000 mg | ORAL_TABLET | Freq: Two times a day (BID) | ORAL | Status: DC

## 2016-01-03 NOTE — Patient Instructions (Addendum)
We will be checking the following labs today - NONE   Medication Instructions:    Continue with your current medicines.   Call us back with the name/dosing of your metoprolol    Testing/Procedures To Be Arranged:  N/A  Follow-Up:   See Dr. Radford Pax as planned unless you have any issues sooner.     Other Special Instructions:   N/A    If you need a refill on your cardiac medications before your next appointment, please call your pharmacy.   Call the [place] office at [phone] if you have any questions, problems or concerns.

## 2016-01-03 NOTE — Progress Notes (Signed)
CARDIOLOGY OFFICE NOTE  Date:  01/03/2016    Jeffrey Holmes Date of Birth: 1950-09-22 Medical Record [id number]  PCP:  Nanci Pina, FNP  Cardiologist:  Radford Pax    Chief Complaint  Patient presents with  . Chest Pain  . Coronary Artery Disease    Work in visit - referred from cardiac rehab.    History of Present Illness: Jeffrey Holmes is a 65 y.o. male who presents today for a work in visit. Seen for Dr. Radford Pax.   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 He also has a history of HTN, dyslipidemia, CKD stage 3.   He was last seen just about 3 weeks ago - doing very well.   Comes back today. Here alone. Did not want to be here and does not think he needs to be here. He had his exit interview with cardiac rehab and was noted that if he really overexerts himself he will feel some heaviness in the chest - it is nothing like his prior chest pain syndrome. He is walking 2 to 4 miles even with cardiac rehab. No problems noted. No need to use NTG. He is very happy with how he is doing.   Past Medical History  Diagnosis Date  . Chronic kidney disease, stage 3     multiple kidney stones  . H/O hiatal hernia   . Benign essential HTN 08/08/2015  . Hyperlipidemia 08/08/2015  . History of acute renal failure 2015    secondary to stones  . CAD (coronary artery disease), native coronary artery 08/28/2015    Severe multivessel ASCAD s/p CABG hybrid with LIMA to LAD and diag and PCI of the RCA.     Past Surgical History  Procedure Laterality Date  . Back surgery  2006    microdisectomy  . Lithotripsy      2-3 times in past  . Cystoscopy with retrograde pyelogram, ureteroscopy and stent placement Right 01/06/2014    Procedure: CYSTOSCOPY WITH RETROGRADE PYELOGRAM, URETEROSCOPY AND STENT PLACEMENT;  Surgeon: Bernestine Amass, MD;  Location: [facility];  Service: Urology;  Laterality: Right;  . Cardiovascular stress test   08/15/2015  . Cardiac catheterization N/A 08/18/2015    Procedure: Left Heart Cath and Coronary Angiography;  Surgeon: Jettie Booze, MD;  Location: [facility];  Service: Cardiovascular;  Laterality: N/A;  . Tee without cardioversion N/A 08/21/2015    Procedure: TRANSESOPHAGEAL ECHOCARDIOGRAM (TEE);  Surgeon: [name], MD;  Location: [facility];  Service: Open Heart Surgery;  Laterality: N/A;  . Coronary artery bypass graft N/A 08/21/2015    Procedure: OFF PUMP CORONARY ARTERY BYPASS GRAFTING (CABG), TIMES TWO, USING LEFT INTERNAL MAMMARY ARTERY, RIGHT GREATER SAPHENOUS VEIN HARVESTED ENDOSCOPICALLY;  Surgeon: [name], MD;  Location: [place];  Service: Open Heart Surgery;  Laterality: N/A;  . Angioplasty N/A 08/21/2015    Procedure: ANGIOPLASTY WITH PERCUTANEOUS CORONARY INTERVENTION WITH DES TO RIGHT CORONARY ARTERY;  Surgeon: Jettie Booze, MD;  Location: [facility];  Service: Cardiovascular;  Laterality: N/A;  . Cardiac catheterization N/A 08/21/2015    Procedure: Coronary Stent Intervention;  Surgeon: Jettie Booze, MD;  Location: [facility];  Service: Cardiovascular;  Laterality: N/A;  . Cardiac catheterization  08/21/2015    Procedure: Bypass Graft Angiography;  Surgeon: Jettie Booze, MD;  Location: [facility];  Service: Cardiovascular;;  Medications: Current Outpatient Prescriptions  Medication Sig Dispense Refill  . amLODipine (NORVASC) 5 MG tablet Take 1 tablet (5 mg total) by mouth daily. 90 tablet 3  . aspirin EC 81 MG tablet Take 1 tablet (81 mg total) by mouth daily.    [name] atorvastatin (LIPITOR) 80 MG tablet Take 1 tablet (80 mg total) by mouth daily. 90 tablet 3  . clopidogrel (PLAVIX) 75 MG tablet Take 1 tablet (75 mg total) by mouth daily. 30 tablet 11  . ezetimibe (ZETIA) 10 MG tablet Take 1 tablet (10 mg total) by mouth daily. 30 tablet 11  . metoprolol succinate (TOPROL-XL) 50 MG 24 hr tablet Take 50 mg by mouth 2 (two)  times daily. Take with or immediately following a meal.    . nitroGLYCERIN (NITROSTAT) 0.4 MG SL tablet Place 0.4 mg under the tongue as needed. Place 1 tablet under your tongue every 5 minutes as needed for chest pain for a max of 3 doses    . timolol (TIMOPTIC) 0.5 % ophthalmic solution Place 1 drop into both eyes 2 (two) times daily.     . travoprost, benzalkonium, (TRAVATAN) 0.004 % ophthalmic solution Place 1 drop into both eyes at bedtime.     No current facility-administered medications for this visit.    Allergies: Allergies  Allergen Reactions  . Vicodin [Hydrocodone-Acetaminophen] Nausea And Vomiting  . Hydrocodone Nausea And Vomiting  . Oxycontin [Oxycodone Hcl] Nausea And Vomiting  . Percocet [Oxycodone-Acetaminophen] Nausea And Vomiting    Social History: The patient  reports that he has never smoked. He has never used smokeless tobacco. He reports that he drinks alcohol. He reports that he does not use illicit drugs.   Family History: The patient's family history includes Heart attack in his mother; Kidney failure in his mother; Throat cancer in his father.   Review of Systems: Please see the history of present illness.   Otherwise, the review of systems is positive for none.   All other systems are reviewed and negative.   Physical Exam: VS:  BP 150/80 mmHg  Pulse 57  Ht 5\' 5"  (1.651 m)  Wt 185 lb 6.4 oz (84.097 kg)  BMI 30.85 kg/m2 .  BMI Body mass index is 30.85 kg/(m^2).  Wt Readings from Last 3 Encounters:  01/03/16 185 lb 6.4 oz (84.097 kg)  12/12/15 183 lb 1.9 oz (83.063 kg)  12/05/15 184 lb (83.462 kg)    General: Very pleasant. Well developed, well nourished and in no acute distress.  HEENT: Normal. Neck: Supple, no JVD, carotid bruits, or masses noted.  Cardiac: Regular rate and rhythm. No murmurs, rubs, or gallops. No edema.  Respiratory:  Lungs are clear to auscultation bilaterally with normal work of breathing.  GI: Soft and nontender.  MS: No  deformity or atrophy. Gait and ROM intact. Skin: Warm and dry. Color is normal.  Neuro:  Strength and sensation are intact and no gross focal deficits noted.  Psych: Alert, appropriate and with normal affect.   LABORATORY DATA:  EKG:  EKG is ordered today. This demonstrates NSR - reviewed with Dr. Curt Bears here today - no ST segment elevation noted.    Lab Results  Component Value Date   WBC 8.3 12/05/2015   HGB 15.6 12/05/2015   HCT 48.5 12/05/2015   PLT 246 12/05/2015   GLUCOSE 168* 12/05/2015   CHOL 175 12/21/2015   TRIG 164* 12/21/2015   HDL 42 12/21/2015   LDLCALC 100 12/21/2015   ALT 21 12/21/2015  AST 25 12/05/2015   NA 145 12/05/2015   K 5.3* 12/05/2015   CL 108 12/05/2015   CREATININE 2.01* 12/05/2015   BUN 16 12/05/2015   CO2 26 12/05/2015   TSH 3.479 08/17/2015   INR 1.27 08/21/2015   HGBA1C 6.9* 08/19/2015    BNP (last 3 results) No results for input(s): BNP in the last 8760 hours.  ProBNP (last 3 results) No results for input(s): PROBNP in the last 8760 hours.   Other Studies Reviewed Today:  Procedures 07/2015    Left Heart Cath and Coronary Angiography    Conclusion     Ost LAD to Mid LAD lesion, 80% stenosed.  Mid LAD lesion, 99% stenosed.  Prox RCA lesion, 70% stenosed.  Normal LVEDP  Severe LAD disease. Percutaneous revascularization would be difficult since the ostium of the vessel is involved and there have to be stent in the left main. The mid vessel lesion is subtotally occluded and there are no collaterals which would make wire manipulation higher risk. I think a LIMA to LAD would give the patient the best long-term result.  In regards to his RCA lesion, this appears amenable to percutaneous revascularization. I've spoken to Dr. Roxan Hockey regarding a possible hybrid approach in which DES to the RCA would be performed while LIMA to LAD, potentially off-pump, could be performed. We'll discuss this further.        PCI  procedure 07/2015    Bypass Graft Angiography   Coronary Stent Intervention    Conclusion     SVG to diagonal was injected and appeared to have a kink in the graft proximally causing a 90% stenosis on the angio we took during surgery. Dr. Servando Snare went back to the heart and straightened the graft.  There was a tortuous course of the LIMA graft. Dr. Servando Snare went back to the graft and straightened the LIMA graft after we took the intraoperative angiogram. The distal LAD was a small vessel with some mild diffuse disease.  Prox RCA lesion, 80% stenosed. Post intervention with a 3.5 x 16 Synergy DES, post dilated to 3.8 mm, there is a 0% residual stenosis.  Continue dual antiplatelet therapy for at least 12 months. Continue aggressive secondary prevention. He will be watched in the SICU post procedure.     Assessment/Plan:  ASCAD s/p hybrid CABG with LIMA to LAD/Diag and PCI of RCA - I think he continues to do well. His symptoms have actually improved since his original procedure. I favor continued medical management. We do need to clarify his metoprolol. He will call back with that information. Continue ASA/Plavix/statin/BB.   HTN - BP recheck by me is down to 140/80.  Dyslipidemia - continue statin. Continue statin.   Current medicines are reviewed with the patient today.  The patient does not have concerns regarding medicines other than what has been noted above.  The following changes have been made:  See above.  Labs/ tests ordered today include:   No orders of the defined types were placed in this encounter.     Disposition:   FU with Dr. Radford Pax as planned in October.   Patient is agreeable to this plan and will call if any problems develop in the interim.   Signed: Burtis Junes, RN, ANP-C 01/03/2016 12:11 PM  [facility] [address] [place], [place]  [address] Phone: [phone] Fax: [phone]

## 2016-01-03 NOTE — Addendum Note (Signed)
Addended by: Burtis Junes on: 01/03/2016 12:47 PM   Modules accepted: Orders, Medications

## 2016-01-03 NOTE — Telephone Encounter (Signed)
-----   Message from Sueanne Margarita, MD sent at 01/02/2016  9:08 AM EDT ----- Regarding: RE: cardiac rehab Please word him in with extender in the office ----- Message -----    From: Lowell Guitar, RN    Sent: 01/01/2016   9:22 AM      To: Sueanne Margarita, MD Subject: cardiac rehab                                  Dear Dr. Radford Pax,  Pt reported today that he occasionally has chest heaviness with more than usual exertion (ie walking 2 miles after his cardiac rehab session or climbing bleachers for exercise).   Pt denies symptoms with cardiac rehab activities or normal activities.  Pt states these symptoms are not like his typical anginal equivalent.    Pt home exercise instructions reinforced to avoid overexertion.  Would you like for him to be seen in the office or just continue to monitor symptoms and avoid heavy overexertion?    Thank you, Andi Hence, RN, BSN Cardiac Pulmonary Rehab

## 2016-01-03 NOTE — Telephone Encounter (Signed)
appt scheduled 01/03/16 @11 :30 with Truitt Merle, NP.  Pt made aware.  Understanding verbalized

## 2016-01-05 ENCOUNTER — Encounter (HOSPITAL_COMMUNITY)
Admission: RE | Admit: 2016-01-05 | Discharge: 2016-01-05 | Disposition: A | Discharge status: Home / Self Care | Payer: Medicare Other | Source: Ambulatory Visit | Attending: Cardiology | Admitting: Cardiology

## 2016-01-05 DIAGNOSIS — Z951 Presence of aortocoronary bypass graft: Secondary | ICD-10-CM | POA: Diagnosis not present

## 2016-01-08 ENCOUNTER — Encounter (HOSPITAL_COMMUNITY)
Admission: RE | Admit: 2016-01-08 | Discharge: 2016-01-08 | Disposition: A | Discharge status: Home / Self Care | Payer: Medicare Other | Source: Ambulatory Visit | Attending: Cardiology | Admitting: Cardiology

## 2016-01-08 ENCOUNTER — Encounter (HOSPITAL_COMMUNITY): Payer: Self-pay

## 2016-01-08 VITALS — Wt 185.0 lb

## 2016-01-08 DIAGNOSIS — Z951 Presence of aortocoronary bypass graft: Secondary | ICD-10-CM | POA: Diagnosis not present

## 2016-01-08 NOTE — Progress Notes (Signed)
Discharge Summary  Patient Details  Name: Jeffrey Holmes MRN: [id number] Date of Birth: [birth year] Referring Provider:            CARDIAC REHAB PHASE II EXERCISE from 11/08/2015 in [facility]   Referring Provider  Fransico Him MD       Number of Visits: 36  Reason for Discharge:  Patient independent in their exercise.  Smoking History:  History  Smoking status  . Never Smoker   Smokeless tobacco  . Never Used    Diagnosis:  S/P CABG x 2  ADL UCSD:   Initial Exercise Prescription:     Initial Exercise Prescription - 11/09/15 1400    Date of Initial Exercise RX and Referring Provider   Date 10/12/15   Referring Provider Fransico Him MD   Treadmill   MPH 3   Grade 1   Minutes 10   METs 3.7   Bike   Level 1.2   Minutes 10   METs 3.71   NuStep   Level 2   Minutes 10   METs 3.4   Resistance Training   Training Prescription Yes   Weight 4 lbs   Reps 10-12      Discharge Exercise Prescription (Final Exercise Prescription Changes):     Exercise Prescription Changes - 01/01/16 1639    Home Exercise Plan   Plans to continue exercise at --  walking      Functional Capacity:     6 Minute Walk      10/12/15 1034       6 Minute Walk   Phase Initial     Distance 1683 feet     Walk Time 6 minutes     MPH 3.19     METS 3.69     RPE 9     Perceived Dyspnea  0     VO2 Peak 12.93     Symptoms No     Resting HR 76 bpm     Resting BP 142/78 mmHg     Max Ex. HR 98 bpm     Max Ex. BP 136/84 mmHg     2 Minute Post BP 134/84 mmHg     Pre/Post BP   Baseline BP 142/78 mmHg     6 Minute BP 136/84 mmHg     Pre/Post BP? Yes        Psychological, QOL, Others - Outcomes: PHQ 2/9: Depression screen PHQ 2/9 10/16/2015  Decreased Interest 0  Down, Depressed, Hopeless 0  PHQ - 2 Score 0    Quality of Life:     Quality of Life - 01/08/16 1314    Quality of Life Scores   GLOBAL Pre 27.28 %   GLOBAL Post 28.8 %   GLOBAL % Change 5.57 %      Personal Goals: Goals established at orientation with interventions provided to work toward goal.     Personal Goals and Risk Factors at Admission - 11/15/15 1110    Core Components/Risk Factors/Patient Goals on Admission    Weight Management Weight Loss   Intervention Weight Management: Develop a combined nutrition and exercise program designed to reach desired caloric intake, while maintaining appropriate intake of nutrient and fiber, sodium and fats, and appropriate energy expenditure required for the weight goal.   Expected Outcomes Short Term: Continue to assess and modify interventions until short term weight is achieved   Intervention increase exercise tolerance and METs in order to get back to playing  tennis   Expected Outcomes Increase exercise capacity, exercise tolerance and confidence with activity. Continue to monitor weightloss.       Personal Goals Discharge:     Goals and Risk Factor Review      11/03/15 0946 12/15/15 1241 12/15/15 1731       Core Components/Risk Factors/Patient Goals Review   Personal Goals Review Increase Strength and Stamina;Other Other;Weight Management/Obesity Hypertension     Review noticed that breathing quality is better but mild chest discomfort maintaining weightloss and according to fitness level (MET's) is now able to play tennis. pt will try and play over the weekend resting BP improved with medication adjustments, exercise BP elevated with increased workloads and PO sodium consumption.       Expected Outcomes Increased exercise capacity and confidence with exercise. Will continue to monitor exericse progression able to sustain weightloss, be able to play tennis without extreme fatigue or SOB continue lifestyle modifications to control hypertension        Nutrition & Weight - Outcomes:     Pre Biometrics - 10/12/15 1210    Pre Biometrics   Height 5' 6.25" (1.683 m)   Weight 182 lb 1.6 oz (82.6 kg)   Waist  Circumference 39 inches   Hip Circumference 39 inches   Waist to Hip Ratio 1 %   BMI (Calculated) 29.2   Triceps Skinfold 13 mm   % Body Fat 27.2 %   Grip Strength 46 kg   Flexibility 7.5 in   Single Leg Stand 7.3 seconds       Nutrition:     Nutrition Therapy & Goals - 10/12/15 1521    Nutrition Therapy   Diet Therapeutic Lifestyle Changes, Consistent CHO   Personal Nutrition Goals   Personal Goal #1 6-20 lb wt loss at graduation from Cardiac Rehab   Personal Goal #2 Basic understanding of diabetic diet principles (A1c 6.9)   Intervention Plan   Intervention Prescribe, educate and counsel regarding individualized specific dietary modifications aiming towards targeted core components such as weight, hypertension, lipid management, diabetes, heart failure and other comorbidities.   Expected Outcomes Short Term Goal: Understand basic principles of dietary content, such as calories, fat, sodium, cholesterol and nutrients.;Long Term Goal: Adherence to prescribed nutrition plan.      Nutrition Discharge:     Nutrition Assessments - 01/15/16 0906    MEDFICTS Scores   Pre Score 48   Post Score 33   Score Difference -15      Education Questionnaire Score:     Knowledge Questionnaire Score - 10/18/15 0811    Knowledge Questionnaire Score   Pre Score 88      Goals reviewed with patient; copy given to patient.  Pt congratulated on lowered blood pressure.  Pt also cautioned against overexertion.  Pt verbalized understanding

## 2016-01-10 ENCOUNTER — Encounter (HOSPITAL_COMMUNITY): Payer: Medicare Other

## 2016-01-12 ENCOUNTER — Encounter (HOSPITAL_COMMUNITY): Payer: Medicare Other

## 2016-01-15 ENCOUNTER — Encounter (HOSPITAL_COMMUNITY): Payer: Medicare Other

## 2016-01-17 ENCOUNTER — Encounter (HOSPITAL_COMMUNITY): Payer: Medicare Other

## 2016-01-19 ENCOUNTER — Encounter (HOSPITAL_COMMUNITY): Payer: Medicare Other

## 2016-02-02 NOTE — Addendum Note (Signed)
Encounter addended by: Leila Schuff D Janeth Terry on: 02/02/2016  3:52 PM<BR>     Documentation filed: Flowsheet VN, Inpatient Document Flowsheet

## 2016-02-02 NOTE — Addendum Note (Signed)
Encounter addended by: Carole Deere D Kahli Mayon on: 02/02/2016  4:58 PM<BR>     Documentation filed: Flowsheet VN

## 2016-02-27 ENCOUNTER — Other Ambulatory Visit: Payer: Medicare Other | Admitting: *Deleted

## 2016-02-27 DIAGNOSIS — E785 Hyperlipidemia, unspecified: Secondary | ICD-10-CM

## 2016-02-27 LAB — LIPID PANEL
CHOL/HDL RATIO: 3.8 ratio (ref <=5.0)
CHOLESTEROL: 163 mg/dL (ref 125–200)
HDL: 43 mg/dL (ref >=40)
LDL Cholesterol: 86 mg/dL (ref <130)
Triglycerides: 171 mg/dL — ABNORMAL HIGH (ref <150)
VLDL: 34 mg/dL — AB (ref <30)

## 2016-02-27 LAB — ALT: ALT: 26 U/L (ref 9–46)

## 2016-03-04 ENCOUNTER — Telehealth: Payer: Self-pay | Admitting: Cardiology

## 2016-03-04 NOTE — Telephone Encounter (Signed)
Left message to call back  

## 2016-03-04 NOTE — Telephone Encounter (Signed)
New message    Pt c/o of Chest Pain: STAT if CP now or developed within 24 hours  1. Are you having CP right now? Patient calling - chest discomfort - no   2. Are you experiencing any other symptoms (ex. SOB, nausea, vomiting, sweating)? Have to drink something to help food go down easy   3. How long have you been experiencing CP? About a week    4. Is your CP continuous or coming and going? Coming  / going.    5. Have you taken Nitroglycerin? No  ?

## 2016-03-05 NOTE — Telephone Encounter (Signed)
Patient called to report his digestion is "off." He states he has to drink lots of water to get food down because it feels like his food gets "stuck" going down. He also reports lots of belching. He denies CP, SOB. Instructed him to call PCP for evaluation of swallowing problems. He understands he will only be called back if Dr. Radford Pax has further recommendations.

## 2016-03-14 NOTE — Progress Notes (Deleted)
Patient ID: [name]                 DOB: [birth year]                    MRN: [id number]     HPI: Jeffrey Holmes is a 65 y.o. male patient referred to lipid clinic by ***. 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 He also has a history of HTN, HLD, and CKD stage 3. Patient has LDL still not at goal on high intensity statin with ezetimibe, and presents to lipid clinic today for further management.     Current Medications: atorvastatin 80 mg, ezetimibe 10 mg  Intolerances: none Risk Factors: ASCAD s/p CABG  LDL goal: <70 mg/dL  Diet:   Exercise:   Family History: MI in his mother (age )  Social History:   Labs: (02/27/2016) TC 163, TG 171, HDL 43, LDL 86 (on atorvastatin 80 mg and Zetia) (12/21/2015) TC 175, TG 164, HDL 42, LDL 100 (on atorvastatin 80 mg) (11/03/2015) TC 183, TG 238, HDL 41, LDL 94, AST 18, ALT 26 (on atorvastatin 40 mg)    Past Medical History:  Diagnosis Date  . Benign essential HTN 08/08/2015  . CAD (coronary artery disease), native coronary artery 08/28/2015   Severe multivessel ASCAD s/p CABG hybrid with LIMA to LAD and diag and PCI of the RCA.   [name] Chronic kidney disease, stage 3    multiple kidney stones  . H/O hiatal hernia   . History of acute renal failure 2015   secondary to stones  . Hyperlipidemia 08/08/2015    Current Outpatient Prescriptions on File Prior to Visit  Medication Sig Dispense Refill  . amLODipine (NORVASC) 5 MG tablet Take 1 tablet (5 mg total) by mouth daily. 90 tablet 3  . aspirin EC 81 MG tablet Take 1 tablet (81 mg total) by mouth daily.    [name] atorvastatin (LIPITOR) 80 MG tablet Take 1 tablet (80 mg total) by mouth daily. 90 tablet 3  . clopidogrel (PLAVIX) 75 MG tablet Take 1 tablet (75 mg total) by mouth daily. 30 tablet 11  . ezetimibe (ZETIA) 10 MG tablet Take 1 tablet (10 mg total) by mouth daily. 30 tablet 11  . metoprolol succinate (TOPROL-XL) 50 MG 24 hr  tablet Take 1 tablet (50 mg total) by mouth 2 (two) times daily. Take with or immediately following a meal. 180 tablet 3  . nitroGLYCERIN (NITROSTAT) 0.4 MG SL tablet Place 0.4 mg under the tongue as needed. Place 1 tablet under your tongue every 5 minutes as needed for chest pain for a max of 3 doses    . timolol (TIMOPTIC) 0.5 % ophthalmic solution Place 1 drop into both eyes 2 (two) times daily.     . travoprost, benzalkonium, (TRAVATAN) 0.004 % ophthalmic solution Place 1 drop into both eyes at bedtime.     No current facility-administered medications on file prior to visit.     Allergies  Allergen Reactions  . Vicodin [Hydrocodone-Acetaminophen] Nausea And Vomiting  . Hydrocodone Nausea And Vomiting  . Oxycontin [Oxycodone Hcl] Nausea And Vomiting  . Percocet [Oxycodone-Acetaminophen] Nausea And Vomiting    Assessment/Plan:  1. Hyperlipidemia with ASCAD s/p hybrid CABG and PCI: patient's most recent LDL is elevated at 86 mg/dL, still above goal of <70 mg/dL on high intensity statin with atorvastatin 80 mg and ezetimibe 10  mg. Given his hx of severe multivessel CAD and elevated LDL, PCSK9 inhibitor is the only option to reduce LDL and prevent future ASCVD.

## 2016-03-15 ENCOUNTER — Ambulatory Visit: Payer: Medicare Other

## 2016-03-21 NOTE — Progress Notes (Signed)
Patient ID: ZACHEUS [employer]P                 DOB: 1951/03/09                    MRN: [id number]     HPI: Jeffrey Holmes is a 65 y.o. male patient referred to lipid clinic by Dr. Radford Pax. 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 He also has a history of HTN, HLD, and CKD stage 3. He presents today for further cholesterol management.  Patient is interested in trying another cholesterol medication to further lower his cholesterol given his history of 2 stents and CABG. Reports compliance and tolerability with his current reigmen of atorvastatin 80mg  and ezetimibe 10mg  daily.   Current Medications: atorvastatin 80 mg daily, ezetimibe 10 mg daily Intolerances: none Risk Factors: HTN, CABG with PCI LDL goal: <70 mg/dL   Diet: Breakfast- cereal, fruit, 1-2 eggs occasionally. Doesn't eat lunch. Dinner- spaghetti. Drinks 2 cans/day of regular soda    Exercise: walks 4 times per day 4-5 x/week  Family History: His mother had heart attack.   Social History: Never smoker. Never used smokeless tobacco. Does not use illicit drugs.   Labs: (02/27/2016) TC 163, TG 171, HDL 43, LDL 86, ALT 26 (on atorvastatin 80mg , ezetimibe 10mg )  (12/21/2015) TC 175, TG 164, HDL 42, LDL 100 (on atorvastatin 80mg )   Past Medical History:  Diagnosis Date  . Benign essential HTN 08/08/2015  . CAD (coronary artery disease), native coronary artery 08/28/2015   Severe multivessel ASCAD s/p CABG hybrid with LIMA to LAD and diag and PCI of the RCA.   [name] Chronic kidney disease, stage 3    multiple kidney stones  . H/O hiatal hernia   . History of acute renal failure 2015   secondary to stones  . Hyperlipidemia 08/08/2015    Current Outpatient Prescriptions on File Prior to Visit  Medication Sig Dispense Refill  . amLODipine (NORVASC) 5 MG tablet Take 1 tablet (5 mg total) by mouth daily. 90 tablet 3  . aspirin EC 81 MG tablet Take 1 tablet (81 mg total) by mouth  daily.    [name] atorvastatin (LIPITOR) 80 MG tablet Take 1 tablet (80 mg total) by mouth daily. 90 tablet 3  . clopidogrel (PLAVIX) 75 MG tablet Take 1 tablet (75 mg total) by mouth daily. 30 tablet 11  . ezetimibe (ZETIA) 10 MG tablet Take 1 tablet (10 mg total) by mouth daily. 30 tablet 11  . metoprolol succinate (TOPROL-XL) 50 MG 24 hr tablet Take 1 tablet (50 mg total) by mouth 2 (two) times daily. Take with or immediately following a meal. 180 tablet 3  . nitroGLYCERIN (NITROSTAT) 0.4 MG SL tablet Place 0.4 mg under the tongue as needed. Place 1 tablet under your tongue every 5 minutes as needed for chest pain for a max of 3 doses    . timolol (TIMOPTIC) 0.5 % ophthalmic solution Place 1 drop into both eyes 2 (two) times daily.     . travoprost, benzalkonium, (TRAVATAN) 0.004 % ophthalmic solution Place 1 drop into both eyes at bedtime.     No current facility-administered medications on file prior to visit.     Allergies  Allergen Reactions  . Vicodin [Hydrocodone-Acetaminophen] Nausea And Vomiting  . Hydrocodone Nausea And Vomiting  . Oxycontin [Oxycodone Hcl] Nausea And Vomiting  . Percocet [Oxycodone-Acetaminophen] Nausea And  Vomiting    Assessment/Plan:  1. Hyperlipidemia with ASCVD: Most recent LDL 86mg /dL, still above goal of <70 mg/dL given history of CABG and PCI. Patient already taking high intensity atorvastatin 80 mg and ezetimibe 10mg  daily. Given his high risk of future atherosclerotic cardiovascular events, patient requires further lipid lowering. Discussed expected benefits, side effects, and administration technique of PCSK9i. Will start paperwork for Repatha coverage.   Megan E. Supple, PharmD, [place] [id number] [place],  [address] Phone: [phone]; Fax: [phone] 03/22/2016 8:58 AM

## 2016-03-22 ENCOUNTER — Ambulatory Visit (INDEPENDENT_AMBULATORY_CARE_PROVIDER_SITE_OTHER): Payer: Medicare Other | Admitting: Pharmacist

## 2016-03-22 DIAGNOSIS — E785 Hyperlipidemia, unspecified: Secondary | ICD-10-CM | POA: Diagnosis not present

## 2016-03-22 DIAGNOSIS — I2510 Atherosclerotic heart disease of native coronary artery without angina pectoris: Secondary | ICD-10-CM

## 2016-03-22 NOTE — Patient Instructions (Signed)
We will start the paperwork for Repatha injections to help lower your cholesterol.   It may take a few weeks to hear back from your insurance company.  If you have any questions, call Megan in the lipid clinic [phone].

## 2016-04-23 DIAGNOSIS — H40013 Open angle with borderline findings, low risk, bilateral: Secondary | ICD-10-CM | POA: Diagnosis not present

## 2016-04-23 DIAGNOSIS — H25099 Other age-related incipient cataract, unspecified eye: Secondary | ICD-10-CM | POA: Diagnosis not present

## 2016-04-23 DIAGNOSIS — H11159 Pinguecula, unspecified eye: Secondary | ICD-10-CM | POA: Diagnosis not present

## 2016-04-30 ENCOUNTER — Encounter: Payer: Self-pay | Admitting: Cardiology

## 2016-05-10 ENCOUNTER — Telehealth: Payer: Self-pay | Admitting: Pharmacist

## 2016-05-10 MED ORDER — EVOLOCUMAB 140 MG/ML ~~LOC~~ SOAJ: 1.0000 "pen " | SUBCUTANEOUS | 11 refills | Status: DC

## 2016-05-10 NOTE — Telephone Encounter (Signed)
Repatha approved through [facility] FEP after appeals letter written. Approval dates 05/08/16 - 08/06/16. Rx sent to [facility] and pt made aware of approval. He will call once he receives his first shipment so we can set up lab work. Will need labs within a few months since his insurance company only authorized use for 3 months.Marland KitchenMarland Kitchen

## 2016-05-14 ENCOUNTER — Encounter: Payer: Self-pay | Admitting: Cardiology

## 2016-05-14 ENCOUNTER — Ambulatory Visit (INDEPENDENT_AMBULATORY_CARE_PROVIDER_SITE_OTHER): Payer: Medicare Other | Admitting: Cardiology

## 2016-05-14 VITALS — BP 142/81 | HR 69 | Ht 65.0 in | Wt 192.8 lb

## 2016-05-14 DIAGNOSIS — I10 Essential (primary) hypertension: Secondary | ICD-10-CM

## 2016-05-14 DIAGNOSIS — E7800 Pure hypercholesterolemia, unspecified: Secondary | ICD-10-CM | POA: Diagnosis not present

## 2016-05-14 DIAGNOSIS — I2510 Atherosclerotic heart disease of native coronary artery without angina pectoris: Secondary | ICD-10-CM

## 2016-05-14 NOTE — Progress Notes (Signed)
Cardiology Office Note    Date:  05/14/2016   ID:  Jeffrey Holmes, DOB 1951/04/05, MRN [id number]  PCP:  Nanci Pina, FNP  Cardiologist:  Fransico Him, MD   Chief Complaint  Patient presents with  . Coronary Artery Disease  . Hypertension  . Hyperlipidemia    History of Present Illness:  Jeffrey Holmes is a 65 y.o. 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  He also has a history of HTN, dyslipidemia, CKD stage 3.  He is doing well today.  He denies any anginal chest pain, SOB, DOE, dizziness, palpitations or syncope.  He has been walking 4 miles daily without any problems. Rarely he will have some swelling in his fingers when his hands hang down when he walks and very minimal ankle edema on occasion.   Past Medical History:  Diagnosis Date  . Benign essential HTN 08/08/2015  . CAD (coronary artery disease), native coronary artery 08/28/2015   Severe multivessel ASCAD s/p CABG hybrid with LIMA to LAD and diag and PCI of the RCA.   [name] Chronic kidney disease, stage 3    multiple kidney stones  . H/O hiatal hernia   . History of acute renal failure 2015   secondary to stones  . Hyperlipidemia 08/08/2015    Past Surgical History:  Procedure Laterality Date  . ANGIOPLASTY N/A 08/21/2015   Procedure: ANGIOPLASTY WITH PERCUTANEOUS CORONARY INTERVENTION WITH DES TO RIGHT CORONARY ARTERY;  Surgeon: Jettie Booze, MD;  Location: [place];  Service: Cardiovascular;  Laterality: N/A;  . BACK SURGERY  2006   microdisectomy  . CARDIAC CATHETERIZATION N/A 08/18/2015   Procedure: Left Heart Cath and Coronary Angiography;  Surgeon: Jettie Booze, MD;  Location: [place] [facility];  Service: Cardiovascular;  Laterality: N/A;  . CARDIAC CATHETERIZATION N/A 08/21/2015   Procedure: Coronary Stent Intervention;  Surgeon: Jettie Booze, MD;  Location: [place] [facility];  Service: Cardiovascular;  Laterality: N/A;    . CARDIAC CATHETERIZATION  08/21/2015   Procedure: Bypass Graft Angiography;  Surgeon: Jettie Booze, MD;  Location: [place] [facility];  Service: Cardiovascular;;  . CARDIOVASCULAR STRESS TEST  08/15/2015  . CORONARY ARTERY BYPASS GRAFT N/A 08/21/2015   Procedure: OFF PUMP CORONARY ARTERY BYPASS GRAFTING (CABG), TIMES TWO, USING LEFT INTERNAL MAMMARY ARTERY, RIGHT GREATER SAPHENOUS VEIN HARVESTED ENDOSCOPICALLY;  Surgeon: [name], MD;  Location: [place];  Service: Open Heart Surgery;  Laterality: N/A;  . CYSTOSCOPY WITH RETROGRADE PYELOGRAM, URETEROSCOPY AND STENT PLACEMENT Right 01/06/2014   Procedure: CYSTOSCOPY WITH RETROGRADE PYELOGRAM, URETEROSCOPY AND STENT PLACEMENT;  Surgeon: Bernestine Amass, MD;  Location: [facility];  Service: Urology;  Laterality: Right;  . LITHOTRIPSY     2-3 times in past  . TEE WITHOUT CARDIOVERSION N/A 08/21/2015   Procedure: TRANSESOPHAGEAL ECHOCARDIOGRAM (TEE);  Surgeon: [name], MD;  Location: [place] [facility];  Service: Open Heart Surgery;  Laterality: N/A;    Current Medications: Outpatient Medications Prior to Visit  Medication Sig Dispense Refill  . amLODipine (NORVASC) 5 MG tablet Take 1 tablet (5 mg total) by mouth daily. 90 tablet 3  . aspirin EC 81 MG tablet Take 1 tablet (81 mg total) by mouth daily.    [name] atorvastatin (LIPITOR) 80 MG tablet Take 1 tablet (80 mg total) by mouth daily. 90 tablet 3  . clopidogrel (PLAVIX) 75 MG tablet Take 1  tablet (75 mg total) by mouth daily. 30 tablet 11  . Evolocumab (REPATHA SURECLICK) 563 MG/ML SOAJ Inject 1 pen into the skin every 14 (fourteen) days. 2 pen 11  . ezetimibe (ZETIA) 10 MG tablet Take 1 tablet (10 mg total) by mouth daily. 30 tablet 11  . metoprolol succinate (TOPROL-XL) 50 MG 24 hr tablet Take 1 tablet (50 mg total) by mouth 2 (two) times daily. Take with or immediately following a meal. 180 tablet 3  . nitroGLYCERIN (NITROSTAT) 0.4 MG SL tablet Place 0.4 mg under the tongue as needed. Place  1 tablet under your tongue every 5 minutes as needed for chest pain for a max of 3 doses    . timolol (TIMOPTIC) 0.5 % ophthalmic solution Place 1 drop into both eyes 2 (two) times daily.     . travoprost, benzalkonium, (TRAVATAN) 0.004 % ophthalmic solution Place 1 drop into both eyes at bedtime.     No facility-administered medications prior to visit.      Allergies:   Vicodin [hydrocodone-acetaminophen]; Hydrocodone; Oxycontin [oxycodone hcl]; and Percocet [oxycodone-acetaminophen]   Social History   Social History  . Marital status: Married    Spouse name: N/A  . Number of children: N/A  . Years of education: N/A   Social History Main Topics  . Smoking status: Never Smoker  . Smokeless tobacco: Never Used  . Alcohol use Yes     Comment: occasional every 2-3 months  . Drug use: No  . Sexual activity: Not Asked   Other Topics Concern  . None   Social History Narrative  . None     Family History:  The patient's family history includes Heart attack in his mother; Kidney failure in his mother; Throat cancer in his father.   ROS:   Please see the history of present illness.    ROS All other systems reviewed and are negative.  No flowsheet data found.     PHYSICAL EXAM:   VS:  BP (!) 142/81   Pulse 69   Ht 5\' 5"  (1.651 m)   Wt 192 lb 12.8 oz (87.5 kg)   BMI 32.08 kg/m    GEN: Well nourished, well developed, in no acute distress  HEENT: normal  Neck: no JVD, carotid bruits, or masses Cardiac: RRR; no murmurs, rubs, or gallops,no edema.  Intact distal pulses bilaterally.  Respiratory:  clear to auscultation bilaterally, normal work of breathing GI: soft, nontender, nondistended, + BS MS: no deformity or atrophy  Skin: warm and dry, no rash Neuro:  Alert and Oriented x 3, Strength and sensation are intact Psych: euthymic mood, full affect  Wt Readings from Last 3 Encounters:  05/14/16 192 lb 12.8 oz (87.5 kg)  02/02/16 184 lb 15.5 oz (83.9 kg)  01/03/16 185  lb 6.4 oz (84.1 kg)      Studies/Labs Reviewed:   EKG:  EKG is not ordered today.   Recent Labs: 08/17/2015: TSH 3.479 08/22/2015: Magnesium 2.6 12/05/2015: BUN 16; Creatinine, Ser 2.01; Hemoglobin 15.6; Platelets 246; Potassium 5.3; Sodium 145 02/27/2016: ALT 26   Lipid Panel    Component Value Date/Time   CHOL 163 02/27/2016 0802   TRIG 171 (H) 02/27/2016 0802   HDL 43 02/27/2016 0802   CHOLHDL 3.8 02/27/2016 0802   VLDL 34 (H) 02/27/2016 0802   LDLCALC 86 02/27/2016 0802    Additional studies/ records that were reviewed today include:   none    ASSESSMENT:    1. Coronary artery disease involving  native coronary artery of native heart without angina pectoris   2. Benign essential HTN   3. Pure hypercholesterolemia      PLAN:  In order of problems listed above:  1. ASCAD s/p hybrid procedure with LIMA to LAD and diag and PCI of the RCA with no angina.  COntinue ASA/statin Plavix and BB. 2. HTN - BP controlled on current meds.  Continue amlodipine/BB. 3. Hyperlipidemia - LDL goal < 70.  Continue statin and zetia.  He is getting ready to start [place] for persistently elevated LDL.     Medication Adjustments/Labs and Tests Ordered: Current medicines are reviewed at length with the patient today.  Concerns regarding medicines are outlined above.  Medication changes, Labs and Tests ordered today are listed in the Patient Instructions below.  There are no Patient Instructions on file for this visit.   Signed, Fransico Him, MD  05/14/2016 8:44 AM    [address], [place], [place]  [address] Phone: [phone]; Fax: [phone]

## 2016-05-14 NOTE — Patient Instructions (Signed)

## 2016-05-15 DIAGNOSIS — H401132 Primary open-angle glaucoma, bilateral, moderate stage: Secondary | ICD-10-CM | POA: Diagnosis not present

## 2016-05-22 ENCOUNTER — Telehealth: Payer: Self-pay | Admitting: Pharmacist

## 2016-05-22 DIAGNOSIS — J3089 Other allergic rhinitis: Secondary | ICD-10-CM | POA: Diagnosis not present

## 2016-05-22 DIAGNOSIS — R0602 Shortness of breath: Secondary | ICD-10-CM | POA: Diagnosis not present

## 2016-05-22 DIAGNOSIS — K219 Gastro-esophageal reflux disease without esophagitis: Secondary | ICD-10-CM | POA: Diagnosis not present

## 2016-05-22 DIAGNOSIS — J309 Allergic rhinitis, unspecified: Secondary | ICD-10-CM | POA: Diagnosis not present

## 2016-05-22 DIAGNOSIS — E785 Hyperlipidemia, unspecified: Secondary | ICD-10-CM

## 2016-05-22 NOTE — Telephone Encounter (Signed)
New message       Talk to the pharmacist regarding info he received from the ins company about repatha

## 2016-05-22 NOTE — Telephone Encounter (Signed)
Returned patient's call. He received his first shipment of [place] today. He feels comfortable doing his first injection at home. Scheduled follow up lab work in 6 weeks. Will need to resubmit PA in January for continued approval.

## 2016-07-08 ENCOUNTER — Other Ambulatory Visit: Payer: Self-pay | Admitting: Cardiology

## 2016-07-08 ENCOUNTER — Other Ambulatory Visit: Payer: Medicare Other | Admitting: *Deleted

## 2016-07-08 DIAGNOSIS — E785 Hyperlipidemia, unspecified: Secondary | ICD-10-CM | POA: Diagnosis not present

## 2016-07-08 LAB — HEPATIC FUNCTION PANEL
ALT: 25 U/L (ref 9–46)
AST: 18 U/L (ref 10–35)
Albumin: 4.1 g/dL (ref 3.6–5.1)
Alkaline Phosphatase: 77 U/L (ref 40–115)
BILIRUBIN DIRECT: 0.2 mg/dL (ref <=0.2)
BILIRUBIN INDIRECT: 0.4 mg/dL (ref 0.2–1.2)
BILIRUBIN TOTAL: 0.6 mg/dL (ref 0.2–1.2)
Total Protein: 6.5 g/dL (ref 6.1–8.1)

## 2016-07-08 LAB — LIPID PANEL
CHOL/HDL RATIO: 1.6 ratio (ref <5.0)
Cholesterol: 66 mg/dL (ref <200)
HDL: 41 mg/dL (ref >40)
Triglycerides: 183 mg/dL — ABNORMAL HIGH (ref <150)
VLDL: 37 mg/dL — ABNORMAL HIGH (ref <30)

## 2016-07-10 LAB — LDL CHOLESTEROL, DIRECT: Direct LDL: 13 mg/dL (ref <130)

## 2016-07-15 ENCOUNTER — Telehealth: Payer: Self-pay

## 2016-07-15 DIAGNOSIS — E785 Hyperlipidemia, unspecified: Secondary | ICD-10-CM

## 2016-07-15 NOTE — Telephone Encounter (Signed)
-----   Message from Sueanne Margarita, MD sent at 07/13/2016  9:51 PM EST ----- LDL now very low - stop zetia and continue repatha and statin and repeat FLP and ALT in 8 weeks

## 2016-07-15 NOTE — Telephone Encounter (Signed)
Informed patient of results and verbal understanding expressed.  Instructed patient to [place]. FLP and ALT scheduled 09/26/16. Patient agrees with treatment plan.

## 2016-08-30 ENCOUNTER — Other Ambulatory Visit: Payer: Self-pay | Admitting: Cardiology

## 2016-09-06 ENCOUNTER — Ambulatory Visit (HOSPITAL_COMMUNITY)
Admission: EM | Admit: 2016-09-06 | Discharge: 2016-09-06 | Disposition: A | Discharge status: Home / Self Care | Payer: Medicare Other | Attending: Family Medicine | Admitting: Family Medicine

## 2016-09-06 ENCOUNTER — Encounter (HOSPITAL_COMMUNITY): Payer: Self-pay | Admitting: Emergency Medicine

## 2016-09-06 DIAGNOSIS — A084 Viral intestinal infection, unspecified: Secondary | ICD-10-CM | POA: Diagnosis not present

## 2016-09-06 MED ORDER — ONDANSETRON 4 MG PO TBDP
ORAL_TABLET | ORAL | Status: AC
  Filled 2016-09-06: qty 1

## 2016-09-06 MED ORDER — ONDANSETRON 4 MG PO TBDP: 4.0000 mg | ORAL_TABLET | Freq: Three times a day (TID) | ORAL | 0 refills | Status: DC | PRN

## 2016-09-06 MED ORDER — ONDANSETRON 4 MG PO TBDP
4.0000 mg | ORAL_TABLET | Freq: Once | ORAL | Status: AC
  Administered 2016-09-06: 4 mg via ORAL

## 2016-09-06 NOTE — ED Provider Notes (Signed)
CSN: [id number]     Arrival date & time 09/06/16  1825 History   First MD Initiated Contact with Patient 09/06/16 1915     Chief Complaint  Patient presents with  . Nausea   (Consider location/radiation/quality/duration/timing/severity/associated sxs/prior Treatment) Patient c/o nausea and fatigue   The history is provided by the patient.  Diarrhea  Severity:  Mild Onset quality:  Sudden Duration:  5 days Timing:  Constant Relieved by:  Nothing Worsened by:  Nothing Ineffective treatments:  None tried   Past Medical History:  Diagnosis Date  . Benign essential HTN 08/08/2015  . CAD (coronary artery disease), native coronary artery 08/28/2015   Severe multivessel ASCAD s/p CABG hybrid with LIMA to LAD and diag and PCI of the RCA.   [name] Chronic kidney disease, stage 3    multiple kidney stones  . H/O hiatal hernia   . History of acute renal failure 2015   secondary to stones  . Hyperlipidemia 08/08/2015   Past Surgical History:  Procedure Laterality Date  . ANGIOPLASTY N/A 08/21/2015   Procedure: ANGIOPLASTY WITH PERCUTANEOUS CORONARY INTERVENTION WITH DES TO RIGHT CORONARY ARTERY;  Surgeon: Jettie Booze, MD;  Location: [place];  Service: Cardiovascular;  Laterality: N/A;  . BACK SURGERY  2006   microdisectomy  . CARDIAC CATHETERIZATION N/A 08/18/2015   Procedure: Left Heart Cath and Coronary Angiography;  Surgeon: Jettie Booze, MD;  Location: [facility];  Service: Cardiovascular;  Laterality: N/A;  . CARDIAC CATHETERIZATION N/A 08/21/2015   Procedure: Coronary Stent Intervention;  Surgeon: Jettie Booze, MD;  Location: [facility];  Service: Cardiovascular;  Laterality: N/A;  . CARDIAC CATHETERIZATION  08/21/2015   Procedure: Bypass Graft Angiography;  Surgeon: Jettie Booze, MD;  Location: [facility];  Service: Cardiovascular;;  . CARDIOVASCULAR STRESS TEST  08/15/2015  . CORONARY ARTERY BYPASS GRAFT N/A 08/21/2015   Procedure: OFF  PUMP CORONARY ARTERY BYPASS GRAFTING (CABG), TIMES TWO, USING LEFT INTERNAL MAMMARY ARTERY, RIGHT GREATER SAPHENOUS VEIN HARVESTED ENDOSCOPICALLY;  Surgeon: [name], MD;  Location: [facility];  Service: Open Heart Surgery;  Laterality: N/A;  . CYSTOSCOPY WITH RETROGRADE PYELOGRAM, URETEROSCOPY AND STENT PLACEMENT Right 01/06/2014   Procedure: CYSTOSCOPY WITH RETROGRADE PYELOGRAM, URETEROSCOPY AND STENT PLACEMENT;  Surgeon: Bernestine Amass, MD;  Location[facility];  Service: Urology;  Laterality: Right;  . LITHOTRIPSY     2-3 times in past  . TEE WITHOUT CARDIOVERSION N/A 08/21/2015   Procedure: TRANSESOPHAGEAL ECHOCARDIOGRAM (TEE);  Surgeon: [name], MD;  Location: [facility];  Service: Open Heart Surgery;  Laterality: N/A;   Family History  Problem Relation Age of Onset  . Kidney failure Mother   . Heart attack Mother     angina   . Throat cancer Father    Social History  Substance Use Topics  . Smoking status: Never Smoker  . Smokeless tobacco: Never Used  . Alcohol use Yes     Comment: occasional every 2-3 months    Review of Systems  Constitutional: Positive for fatigue.  HENT: Negative.   Eyes: Negative.   Respiratory: Negative.   Cardiovascular: Negative.   Gastrointestinal: Positive for diarrhea.  Endocrine: Negative.   Genitourinary: Negative.   Musculoskeletal: Negative.   Allergic/Immunologic: Negative.   Neurological: Negative.   Hematological: Negative.   Psychiatric/Behavioral: Negative.     Allergies  Vicodin [hydrocodone-acetaminophen]; Hydrocodone; Oxycontin [oxycodone hcl]; and Percocet [oxycodone-acetaminophen]  Home Medications   Prior to Admission medications  Medication Sig Start Date End Date Taking? Authorizing Provider  amLODipine (NORVASC) 5 MG tablet Take 1 tablet (5 mg total) by mouth daily. 12/12/15  Yes Sueanne Margarita, MD  aspirin EC 81 MG tablet Take 1 tablet (81 mg total) by mouth daily. 08/25/15  Yes Erin R Barrett, PA-C   atorvastatin (LIPITOR) 80 MG tablet Take 1 tablet (80 mg total) by mouth daily. 11/07/15  Yes Sueanne Margarita, MD  clopidogrel (PLAVIX) 75 MG tablet Take 1 tablet (75 mg total) by mouth daily. 01/01/16  Yes Sueanne Margarita, MD  metoprolol succinate (TOPROL-XL) 50 MG 24 hr tablet Take 1 tablet (50 mg total) by mouth 2 (two) times daily. Take with or immediately following a meal. 01/03/16  Yes Burtis Junes, NP  timolol (TIMOPTIC) 0.5 % ophthalmic solution Place 1 drop into both eyes 2 (two) times daily.  07/19/15  Yes Historical Provider, MD  travoprost, benzalkonium, (TRAVATAN) 0.004 % ophthalmic solution Place 1 drop into both eyes at bedtime.   Yes Historical Provider, MD  Evolocumab (REPATHA SURECLICK) 240 MG/ML SOAJ Inject 1 pen into the skin every 14 (fourteen) days. 05/10/16   Sueanne Margarita, MD  nitroGLYCERIN (NITROSTAT) 0.4 MG SL tablet Place 0.4 mg under the tongue as needed. Place 1 tablet under your tongue every 5 minutes as needed for chest pain for a max of 3 doses 08/08/15   Historical Provider, MD  ondansetron (ZOFRAN ODT) 4 MG disintegrating tablet Take 1 tablet (4 mg total) by mouth every 8 (eight) hours as needed for nausea or vomiting. 09/06/16   Lysbeth Penner, FNP   Meds Ordered and Administered this Visit   Medications  ondansetron (ZOFRAN-ODT) disintegrating tablet 4 mg (not administered)    BP (!) 167/110 (BP Location: Right Arm)   Pulse 90   Temp 98.7 F (37.1 C) (Oral)   SpO2 95%  No data found.   Physical Exam  Constitutional: He appears well-developed and well-nourished.  HENT:  Head: Normocephalic and atraumatic.  Right Ear: External ear normal.  Left Ear: External ear normal.  Mouth/Throat: Oropharynx is clear and moist.  Eyes: Conjunctivae and EOM are normal. Pupils are equal, round, and reactive to light.  Neck: Normal range of motion. Neck supple.  Cardiovascular: Normal rate, regular rhythm and normal heart sounds.   Pulmonary/Chest: Effort normal and  breath sounds normal.  Nursing note and vitals reviewed.   Urgent Care Course     Procedures (including critical care time)  Labs Review Labs Reviewed - No data to display  Imaging Review No results found.   Visual Acuity Review  Right Eye Distance:   Left Eye Distance:   Bilateral Distance:    Right Eye Near:   Left Eye Near:    Bilateral Near:         MDM   1. Viral gastroenteritis    Zofran ODT4mg  po now Zofran ODT 4 mg po tid #21  Push po fluids, rest, tylenol and motrin otc prn as directed for fever, arthralgias, and myalgias.  Follow up prn if sx's continue or persist.    Lysbeth Penner, FNP 09/06/16 [phone]

## 2016-09-06 NOTE — ED Triage Notes (Signed)
Pt complains of nausea and gagging since Monday.  He reports loss of appetite as well.

## 2016-09-09 DIAGNOSIS — R112 Nausea with vomiting, unspecified: Secondary | ICD-10-CM | POA: Diagnosis not present

## 2016-09-10 ENCOUNTER — Emergency Department (HOSPITAL_COMMUNITY)
Admission: EM | Admit: 2016-09-10 | Discharge: 2016-09-11 | Disposition: A | Discharge status: Home / Self Care | Payer: Medicare Other | Attending: Emergency Medicine | Admitting: Emergency Medicine

## 2016-09-10 ENCOUNTER — Encounter (HOSPITAL_COMMUNITY): Payer: Self-pay | Admitting: Emergency Medicine

## 2016-09-10 DIAGNOSIS — E1165 Type 2 diabetes mellitus with hyperglycemia: Secondary | ICD-10-CM | POA: Insufficient documentation

## 2016-09-10 DIAGNOSIS — Z7982 Long term (current) use of aspirin: Secondary | ICD-10-CM | POA: Insufficient documentation

## 2016-09-10 DIAGNOSIS — N183 Chronic kidney disease, stage 3 (moderate): Secondary | ICD-10-CM | POA: Insufficient documentation

## 2016-09-10 DIAGNOSIS — Z7984 Long term (current) use of oral hypoglycemic drugs: Secondary | ICD-10-CM | POA: Insufficient documentation

## 2016-09-10 DIAGNOSIS — Z79899 Other long term (current) drug therapy: Secondary | ICD-10-CM | POA: Insufficient documentation

## 2016-09-10 DIAGNOSIS — E1122 Type 2 diabetes mellitus with diabetic chronic kidney disease: Secondary | ICD-10-CM | POA: Insufficient documentation

## 2016-09-10 DIAGNOSIS — I129 Hypertensive chronic kidney disease with stage 1 through stage 4 chronic kidney disease, or unspecified chronic kidney disease: Secondary | ICD-10-CM | POA: Insufficient documentation

## 2016-09-10 DIAGNOSIS — E1365 Other specified diabetes mellitus with hyperglycemia: Secondary | ICD-10-CM | POA: Diagnosis not present

## 2016-09-10 DIAGNOSIS — E138 Other specified diabetes mellitus with unspecified complications: Secondary | ICD-10-CM

## 2016-09-10 DIAGNOSIS — I2510 Atherosclerotic heart disease of native coronary artery without angina pectoris: Secondary | ICD-10-CM | POA: Diagnosis not present

## 2016-09-10 DIAGNOSIS — Z951 Presence of aortocoronary bypass graft: Secondary | ICD-10-CM | POA: Insufficient documentation

## 2016-09-10 DIAGNOSIS — R739 Hyperglycemia, unspecified: Secondary | ICD-10-CM

## 2016-09-10 LAB — I-STAT CHEM 8, ED
BUN: 31 mg/dL — ABNORMAL HIGH (ref 6–20)
CREATININE: 1.8 mg/dL — AB (ref 0.61–1.24)
Calcium, Ion: 0.96 mmol/L — ABNORMAL LOW (ref 1.15–1.40)
Chloride: 115 mmol/L — ABNORMAL HIGH (ref 101–111)
GLUCOSE: 443 mg/dL — AB (ref 65–99)
HCT: 48 % (ref 39.0–52.0)
HEMOGLOBIN: 16.3 g/dL (ref 13.0–17.0)
Potassium: 4.4 mmol/L (ref 3.5–5.1)
Sodium: 146 mmol/L — ABNORMAL HIGH (ref 135–145)
TCO2: 21 mmol/L (ref 0–100)

## 2016-09-10 LAB — URINALYSIS, DIPSTICK ONLY
BILIRUBIN URINE: NEGATIVE
KETONES UR: 20 mg/dL — AB
Leukocytes, UA: NEGATIVE
NITRITE: NEGATIVE
PH: 5 (ref 5.0–8.0)
Protein, ur: 100 mg/dL — AB
SPECIFIC GRAVITY, URINE: 1.029 (ref 1.005–1.030)

## 2016-09-10 LAB — CBC WITH DIFFERENTIAL/PLATELET
BASOS PCT: 0 %
Basophils Absolute: 0 10*3/uL (ref 0.0–0.1)
Eosinophils Absolute: 0.1 10*3/uL (ref 0.0–0.7)
Eosinophils Relative: 1 %
HEMATOCRIT: 54.9 % — AB (ref 39.0–52.0)
Hemoglobin: 19 g/dL — ABNORMAL HIGH (ref 13.0–17.0)
LYMPHS PCT: 18 %
Lymphs Abs: 2 10*3/uL (ref 0.7–4.0)
MCH: 31.5 pg (ref 26.0–34.0)
MCHC: 34.6 g/dL (ref 30.0–36.0)
MCV: 91 fL (ref 78.0–100.0)
MONO ABS: 0.8 10*3/uL (ref 0.1–1.0)
MONOS PCT: 7 %
NEUTROS ABS: 8.5 10*3/uL — AB (ref 1.7–7.7)
Neutrophils Relative %: 74 %
PLATELETS: 209 10*3/uL (ref 150–400)
RBC: 6.03 MIL/uL — ABNORMAL HIGH (ref 4.22–5.81)
RDW: 13 % (ref 11.5–15.5)
WBC: 11.3 10*3/uL — ABNORMAL HIGH (ref 4.0–10.5)

## 2016-09-10 LAB — CBG MONITORING, ED
GLUCOSE-CAPILLARY: 540 mg/dL — AB (ref 65–99)
GLUCOSE-CAPILLARY: 393 mg/dL — AB (ref 65–99)
GLUCOSE-CAPILLARY: 309 mg/dL — AB (ref 65–99)
GLUCOSE-CAPILLARY: 407 mg/dL — AB (ref 65–99)
Glucose-Capillary: >600 mg/dL (ref 65–99)
Glucose-Capillary: >600 mg/dL (ref 65–99)

## 2016-09-10 LAB — COMPREHENSIVE METABOLIC PANEL
ALT: 87 U/L — ABNORMAL HIGH (ref 17–63)
ANION GAP: 18 — AB (ref 5–15)
AST: 58 U/L — ABNORMAL HIGH (ref 15–41)
Albumin: 4.6 g/dL (ref 3.5–5.0)
Alkaline Phosphatase: 119 U/L (ref 38–126)
BILIRUBIN TOTAL: 1.7 mg/dL — AB (ref 0.3–1.2)
BUN: 26 mg/dL — ABNORMAL HIGH (ref 6–20)
CO2: 21 mmol/L — ABNORMAL LOW (ref 22–32)
Calcium: 10.3 mg/dL (ref 8.9–10.3)
Chloride: 98 mmol/L — ABNORMAL LOW (ref 101–111)
Creatinine, Ser: 2.46 mg/dL — ABNORMAL HIGH (ref 0.61–1.24)
GFR calc Af Amer: 30 mL/min — ABNORMAL LOW (ref >60)
GFR, EST NON AFRICAN AMERICAN: 26 mL/min — AB (ref >60)
Glucose, Bld: 807 mg/dL (ref 65–99)
POTASSIUM: 5.7 mmol/L — AB (ref 3.5–5.1)
Sodium: 137 mmol/L (ref 135–145)
TOTAL PROTEIN: 8.4 g/dL — AB (ref 6.5–8.1)

## 2016-09-10 LAB — I-STAT VENOUS BLOOD GAS, ED
Acid-base deficit: 5 mmol/L — ABNORMAL HIGH (ref 0.0–2.0)
BICARBONATE: 19.9 mmol/L — AB (ref 20.0–28.0)
O2 SAT: 69 %
PCO2 VEN: 35.6 mmHg — AB (ref 44.0–60.0)
TCO2: 21 mmol/L (ref 0–100)
pH, Ven: 7.355 (ref 7.250–7.430)
pO2, Ven: 37 mmHg (ref 32.0–45.0)

## 2016-09-10 LAB — I-STAT TROPONIN, ED: TROPONIN I, POC: 0 ng/mL (ref 0.00–0.08)

## 2016-09-10 LAB — LIPASE, BLOOD: Lipase: 56 U/L — ABNORMAL HIGH (ref 11–51)

## 2016-09-10 MED ORDER — INSULIN ASPART 100 UNIT/ML ~~LOC~~ SOLN
8.0000 [IU] | Freq: Once | SUBCUTANEOUS | Status: AC
  Administered 2016-09-10: 8 [IU] via INTRAVENOUS
  Filled 2016-09-10: qty 1

## 2016-09-10 MED ORDER — DEXTROSE 5 % IV BOLUS: 250.0000 mL | INTRAVENOUS | Status: DC | PRN

## 2016-09-10 MED ORDER — DEXTROSE-NACL 5-0.45 % IV SOLN: INTRAVENOUS | Status: DC

## 2016-09-10 MED ORDER — SODIUM CHLORIDE 0.9 % IV BOLUS (SEPSIS)
1000.0000 mL | Freq: Once | INTRAVENOUS | Status: AC
  Administered 2016-09-10: 1000 mL via INTRAVENOUS

## 2016-09-10 MED ORDER — SODIUM CHLORIDE 0.9 % IV SOLN: INTRAVENOUS | Status: DC

## 2016-09-10 MED ORDER — SODIUM CHLORIDE 0.9 % IV SOLN
INTRAVENOUS | Status: DC
  Administered 2016-09-10: 4.8 [IU]/h via INTRAVENOUS
  Filled 2016-09-10: qty 2.5

## 2016-09-10 NOTE — ED Triage Notes (Signed)
Pt st's he was seen by his MD yesterday for nausea and dizziness.  Pt st's he was called today and told that his blood sugar was 800.  He was told to come to ED ref new onset diabetes

## 2016-09-10 NOTE — ED Provider Notes (Signed)
[facility] DEPT Provider Note   CSN: [id number] Arrival date & time: 09/10/16  1713     History   Chief Complaint Chief Complaint  Patient presents with  . Hyperglycemia    HPI Jeffrey Holmes is a 66 y.o. male.  HPI Pt st's he was seen by his MD yesterday for nausea and dizziness.  Pt st's he was called today and told that his blood sugar was 800.  He was told to come to ED ref new onset diabetes   Patient states that for the last week he's been having some nausea and vomiting. He is not vomiting much that can barely eat so he thinks that there is nothing to come out. No bleeding. No abdominal pain. He was evaluated in the past and told had a virus.  States otherwise asx. No CP or SOB, no passing out.    Past Medical History:  Diagnosis Date  . Benign essential HTN 08/08/2015  . CAD (coronary artery disease), native coronary artery 08/28/2015   Severe multivessel ASCAD s/p CABG hybrid with LIMA to LAD and diag and PCI of the RCA.   [name] Chronic kidney disease, stage 3    multiple kidney stones  . H/O hiatal hernia   . History of acute renal failure 2015   secondary to stones  . Hyperlipidemia 08/08/2015    Patient Active Problem List   Diagnosis Date Noted  . CAD (coronary artery disease), native coronary artery 11/03/2015  . Abnormal result of cardiovascular function study 08/16/2015  . Chronic renal insufficiency, stage III (moderate) 08/16/2015  . Benign essential HTN 08/08/2015  . Hyperlipidemia 08/08/2015  . Hydronephrosis, right 01/06/2014  . Ureteral calculus 01/06/2014    Past Surgical History:  Procedure Laterality Date  . ANGIOPLASTY N/A 08/21/2015   Procedure: ANGIOPLASTY WITH PERCUTANEOUS CORONARY INTERVENTION WITH DES TO RIGHT CORONARY ARTERY;  Surgeon: Jettie Booze, MD;  Location: [facility];  Service: Cardiovascular;  Laterality: N/A;  . BACK SURGERY  2006   microdisectomy  . CARDIAC CATHETERIZATION N/A 08/18/2015   Procedure: Left Heart Cath and  Coronary Angiography;  Surgeon: Jettie Booze, MD;  Location: [facility];  Service: Cardiovascular;  Laterality: N/A;  . CARDIAC CATHETERIZATION N/A 08/21/2015   Procedure: Coronary Stent Intervention;  Surgeon: Jettie Booze, MD;  Location: [facility];  Service: Cardiovascular;  Laterality: N/A;  . CARDIAC CATHETERIZATION  08/21/2015   Procedure: Bypass Graft Angiography;  Surgeon: Jettie Booze, MD;  Location: [facility];  Service: Cardiovascular;;  . CARDIOVASCULAR STRESS TEST  08/15/2015  . CORONARY ARTERY BYPASS GRAFT N/A 08/21/2015   Procedure: OFF PUMP CORONARY ARTERY BYPASS GRAFTING (CABG), TIMES TWO, USING LEFT INTERNAL MAMMARY ARTERY, RIGHT GREATER SAPHENOUS VEIN HARVESTED ENDOSCOPICALLY;  Surgeon: [name], MD;  Location: [facility];  Service: Open Heart Surgery;  Laterality: N/A;  . CYSTOSCOPY WITH RETROGRADE PYELOGRAM, URETEROSCOPY AND STENT PLACEMENT Right 01/06/2014   Procedure: CYSTOSCOPY WITH RETROGRADE PYELOGRAM, URETEROSCOPY AND STENT PLACEMENT;  Surgeon: Bernestine Amass, MD;  Location: [facility];  Service: Urology;  Laterality: Right;  . LITHOTRIPSY     2-3 times in past  . TEE WITHOUT CARDIOVERSION N/A 08/21/2015   Procedure: TRANSESOPHAGEAL ECHOCARDIOGRAM (TEE);  Surgeon: [name], MD;  Location: [facility];  Service: Open Heart Surgery;  Laterality: N/A;       Home Medications    Prior to Admission medications   Medication Sig Start Date End Date Taking? Authorizing Provider  amLODipine (  NORVASC) 5 MG tablet Take 1 tablet (5 mg total) by mouth daily. 12/12/15  Yes Sueanne Margarita, MD  aspirin EC 81 MG tablet Take 1 tablet (81 mg total) by mouth daily. 08/25/15  Yes Erin R Barrett, PA-C  atorvastatin (LIPITOR) 80 MG tablet Take 1 tablet (80 mg total) by mouth daily. 11/07/15  Yes Sueanne Margarita, MD  clopidogrel (PLAVIX) 75 MG tablet Take 1 tablet (75 mg total) by mouth daily. 01/01/16  Yes Sueanne Margarita, MD  metoprolol succinate  (TOPROL-XL) 50 MG 24 hr tablet Take 1 tablet (50 mg total) by mouth 2 (two) times daily. Take with or immediately following a meal. Patient taking differently: Take 50 mg by mouth every morning. Take with or immediately following a meal. 01/03/16  Yes Burtis Junes, NP  nitroGLYCERIN (NITROSTAT) 0.4 MG SL tablet Place 0.4 mg under the tongue as needed. Place 1 tablet under your tongue every 5 minutes as needed for chest pain for a max of 3 doses 08/08/15  Yes Historical Provider, MD  ondansetron (ZOFRAN ODT) 4 MG disintegrating tablet Take 1 tablet (4 mg total) by mouth every 8 (eight) hours as needed for nausea or vomiting. 09/06/16  Yes Lysbeth Penner, FNP  timolol (TIMOPTIC) 0.5 % ophthalmic solution Place 1 drop into both eyes 2 (two) times daily.  07/19/15  Yes Historical Provider, MD  travoprost, benzalkonium, (TRAVATAN) 0.004 % ophthalmic solution Place 1 drop into both eyes at bedtime.   Yes Historical Provider, MD  Evolocumab (REPATHA SURECLICK) 283 MG/ML SOAJ Inject 1 pen into the skin every 14 (fourteen) days. Patient not taking: Reported on 09/10/2016 05/10/16   Sueanne Margarita, MD  metFORMIN (GLUCOPHAGE) 850 MG tablet Take 1 tablet (850 mg total) by mouth 2 (two) times daily. 09/11/16   Karma Greaser, MD  montelukast (SINGULAIR) 10 MG tablet Take 10 mg by mouth every evening. 09/09/16   Historical Provider, MD  pantoprazole (PROTONIX) 40 MG tablet Take 40 mg by mouth daily before breakfast. 09/09/16   Historical Provider, MD    Family History Family History  Problem Relation Age of Onset  . Kidney failure Mother   . Heart attack Mother     angina   . Throat cancer Father     Social History Social History  Substance Use Topics  . Smoking status: Never Smoker  . Smokeless tobacco: Never Used  . Alcohol use Yes     Comment: occasional every 2-3 months     Allergies   Vicodin [hydrocodone-acetaminophen]; Hydrocodone; Oxycontin [oxycodone hcl]; and Percocet  [oxycodone-acetaminophen]   Review of Systems Review of Systems  Constitutional: Negative for fever.  Allergic/Immunologic: Negative for immunocompromised state.  All other systems reviewed and are negative.    Physical Exam Updated Vital Signs BP 143/97   Pulse 100   Temp 98.5 F (36.9 C) (Oral)   Resp 14   Ht 5\' 5"  (1.651 m)   Wt 83.9 kg   SpO2 97%   BMI 30.79 kg/m  Physical Exam  Constitutional: He appears well-developed and well-nourished. No distress.  HENT:  Head: Normocephalic and atraumatic.  Appears slightly dry  Eyes: Conjunctivae are normal. Pupils are equal, round, and reactive to light. Right eye exhibits no discharge. Left eye exhibits no discharge.  Neck: Normal range of motion. Neck supple.  Cardiovascular: Normal rate and regular rhythm.   No murmur heard. Pulmonary/Chest: Effort normal and breath sounds normal. No respiratory distress.  Abdominal: Soft. Bowel sounds are normal.  He exhibits no distension and no mass. There is no tenderness. There is no rebound and no guarding.  Musculoskeletal: He exhibits no edema.  Neurological: He is alert.  Skin: Skin is warm. He is not diaphoretic.  Psychiatric: He has a normal mood and affect.  Nursing note and vitals reviewed.    ED Treatments / Results  Labs (all labs ordered are listed, but only abnormal results are displayed) Labs Reviewed  CBC WITH DIFFERENTIAL/PLATELET - Abnormal; Notable for the following:       Result Value   WBC 11.3 (*)    RBC 6.03 (*)    Hemoglobin 19.0 (*)    HCT 54.9 (*)    Neutro Abs 8.5 (*)    All other components within normal limits  COMPREHENSIVE METABOLIC PANEL - Abnormal; Notable for the following:    Potassium 5.7 (*)    Chloride 98 (*)    CO2 21 (*)    Glucose, Bld 807 (*)    BUN 26 (*)    Creatinine, Ser 2.46 (*)    Total Protein 8.4 (*)    AST 58 (*)    ALT 87 (*)    Total Bilirubin 1.7 (*)    GFR calc non Af Amer 26 (*)    GFR calc Af Amer 30 (*)     Anion gap 18 (*)    All other components within normal limits  URINALYSIS, DIPSTICK ONLY - Abnormal; Notable for the following:    Color, Urine STRAW (*)    Glucose, UA >=500 (*)    Hgb urine dipstick MODERATE (*)    Ketones, ur 20 (*)    Protein, ur 100 (*)    All other components within normal limits  LIPASE, BLOOD - Abnormal; Notable for the following:    Lipase 56 (*)    All other components within normal limits  CBG MONITORING, ED - Abnormal; Notable for the following:    Glucose-Capillary >600 (*)    All other components within normal limits  I-STAT VENOUS BLOOD GAS, ED - Abnormal; Notable for the following:    pCO2, Ven 35.6 (*)    Bicarbonate 19.9 (*)    Acid-base deficit 5.0 (*)    All other components within normal limits  CBG MONITORING, ED - Abnormal; Notable for the following:    Glucose-Capillary >600 (*)    All other components within normal limits  CBG MONITORING, ED - Abnormal; Notable for the following:    Glucose-Capillary 540 (*)    All other components within normal limits  CBG MONITORING, ED - Abnormal; Notable for the following:    Glucose-Capillary 393 (*)    All other components within normal limits  CBG MONITORING, ED - Abnormal; Notable for the following:    Glucose-Capillary 407 (*)    All other components within normal limits  CBG MONITORING, ED - Abnormal; Notable for the following:    Glucose-Capillary 309 (*)    All other components within normal limits  I-STAT CHEM 8, ED - Abnormal; Notable for the following:    Sodium 146 (*)    Chloride 115 (*)    BUN 31 (*)    Creatinine, Ser 1.80 (*)    Glucose, Bld 443 (*)    Calcium, Ion 0.96 (*)    All other components within normal limits  CBG MONITORING, ED - Abnormal; Notable for the following:    Glucose-Capillary 269 (*)    All other components within normal limits  I-STAT TROPOININ, ED  CBG MONITORING, ED  CBG MONITORING, ED    EKG  EKG Interpretation  Date/Time:  Tuesday September 10 2016 19:15:04 EST Ventricular Rate:  97 PR Interval:    QRS Duration: 98 QT Interval:  374 QTC Calculation: 476 R Axis:   96 Text Interpretation:  Sinus rhythm LAE, consider biatrial enlargement Right axis deviation Consider left ventricular hypertrophy Borderline prolonged QT interval No significant change since last tracing Confirmed by Winfred Leeds  MD, SAM [phone]) on 09/10/2016 7:21:09 PM       Radiology No results found.  Procedures Procedures (including critical care time)  Medications Ordered in ED Medications  dextrose 5 % bolus 250 mL (not administered)  sodium chloride 0.9 % bolus 1,000 mL (0 mLs Intravenous Stopped 09/10/16 2042)  insulin aspart (novoLOG) injection 8 Units (8 Units Intravenous Given 09/10/16 1955)  sodium chloride 0.9 % bolus 1,000 mL (0 mLs Intravenous Stopped 09/10/16 2157)  sodium chloride 0.9 % bolus 1,000 mL (0 mLs Intravenous Stopped 09/10/16 2158)  metFORMIN (GLUCOPHAGE) tablet 850 mg (850 mg Oral Given 09/11/16 0039)     Initial Impression / Assessment and Plan / ED Course  I have reviewed the triage vital signs and the nursing notes.  Pertinent labs & imaging results that were available during my care of the patient were reviewed by me and considered in my medical decision making (see chart for details).     HDS, well appearing Appears slightly dehydrated Hyperglycemic, but not in DKA New onset Not ischemia -related - EKG and trop reassuring No infectious sx now except loose stools which pt actually states is baseline Labs reassuring otherwise Placed on a drip and resolved Monitored and will start on metformin  1am - care transitioned to Dr. Betsey Holiday.  Pt has been advised if repeat glucose stable, home with fu with pcp today-tomorrow on metformin. Pt has no further questions, strict return precautions given and pt and wife verbalize understanding and agreement with plan. Advised on importance of lifestyle changes.   Final Clinical  Impressions(s) / ED Diagnoses   Final diagnoses:  Hyperglycemia  Diabetes mellitus of other type with complication, unspecified long term insulin use status (HCC)    New Prescriptions New Prescriptions   METFORMIN (GLUCOPHAGE) 850 MG TABLET    Take 1 tablet (850 mg total) by mouth 2 (two) times daily.     Karma Greaser, MD 09/11/16 [place], MD 09/12/16 [phone]

## 2016-09-10 NOTE — ED Notes (Signed)
Pt stable, understands discharge instructions, and reasons for return.   

## 2016-09-10 NOTE — ED Provider Notes (Signed)
Complains of lightheadedness and nausea for one week. week. Denies chest pain. Seen by his PMD yesterday told to come to the emergency department today for abnormal lab work. Noted to be markedly hyperglycemic. States he presently feels well. On exam he is alert and nontoxic. Not ill appearing mucous membranes dry lungs clear auscultation heart regular rate and rhythm abdomen nondistended nontender extremity is without edema skin warm dry no rash   Orlie Dakin, MD 09/10/16 1958

## 2016-09-10 NOTE — ED Notes (Signed)
Pt sent by PMD for elevated blood glucose. Pt has no complaints at this time.

## 2016-09-11 DIAGNOSIS — E1165 Type 2 diabetes mellitus with hyperglycemia: Secondary | ICD-10-CM | POA: Diagnosis not present

## 2016-09-11 LAB — CBG MONITORING, ED
GLUCOSE-CAPILLARY: 254 mg/dL — AB (ref 65–99)
Glucose-Capillary: 269 mg/dL — ABNORMAL HIGH (ref 65–99)

## 2016-09-11 MED ORDER — METFORMIN HCL 850 MG PO TABS
850.0000 mg | ORAL_TABLET | Freq: Once | ORAL | Status: AC
  Administered 2016-09-11: 850 mg via ORAL
  Filled 2016-09-11: qty 1

## 2016-09-11 MED ORDER — METFORMIN HCL 850 MG PO TABS: 850.0000 mg | ORAL_TABLET | Freq: Two times a day (BID) | ORAL | 0 refills | Status: DC

## 2016-09-11 NOTE — ED Provider Notes (Signed)
Recheck of patient reveals that he is feeling well. Blood sugars have come down appropriately, now at 254. Patient does have primary care, will follow up as soon as possible for further counseling and management of new onset diabetes. Initiate Glucophage until follow-up.   Orpah Greek, MD 09/11/16 [phone]

## 2016-09-11 NOTE — Discharge Instructions (Signed)
The best way to treat diabetes in addition to medication is to loose weight and exercise, eat a well balanced diet.  Follow up with your doctor to monitor and do additional evaluations as needed

## 2016-09-12 ENCOUNTER — Emergency Department (HOSPITAL_COMMUNITY)
Admission: EM | Admit: 2016-09-12 | Discharge: 2016-09-12 | Disposition: A | Discharge status: Home / Self Care | Payer: Medicare Other | Attending: Emergency Medicine | Admitting: Emergency Medicine

## 2016-09-12 ENCOUNTER — Encounter (HOSPITAL_COMMUNITY): Payer: Self-pay | Admitting: Emergency Medicine

## 2016-09-12 DIAGNOSIS — E1165 Type 2 diabetes mellitus with hyperglycemia: Secondary | ICD-10-CM | POA: Diagnosis not present

## 2016-09-12 DIAGNOSIS — N183 Chronic kidney disease, stage 3 (moderate): Secondary | ICD-10-CM | POA: Diagnosis not present

## 2016-09-12 DIAGNOSIS — Z7982 Long term (current) use of aspirin: Secondary | ICD-10-CM | POA: Diagnosis not present

## 2016-09-12 DIAGNOSIS — R739 Hyperglycemia, unspecified: Secondary | ICD-10-CM

## 2016-09-12 DIAGNOSIS — Z794 Long term (current) use of insulin: Secondary | ICD-10-CM | POA: Diagnosis not present

## 2016-09-12 DIAGNOSIS — I129 Hypertensive chronic kidney disease with stage 1 through stage 4 chronic kidney disease, or unspecified chronic kidney disease: Secondary | ICD-10-CM | POA: Diagnosis not present

## 2016-09-12 DIAGNOSIS — E1122 Type 2 diabetes mellitus with diabetic chronic kidney disease: Secondary | ICD-10-CM | POA: Diagnosis not present

## 2016-09-12 DIAGNOSIS — Z79899 Other long term (current) drug therapy: Secondary | ICD-10-CM | POA: Insufficient documentation

## 2016-09-12 DIAGNOSIS — I2510 Atherosclerotic heart disease of native coronary artery without angina pectoris: Secondary | ICD-10-CM | POA: Insufficient documentation

## 2016-09-12 DIAGNOSIS — Z951 Presence of aortocoronary bypass graft: Secondary | ICD-10-CM | POA: Insufficient documentation

## 2016-09-12 HISTORY — DX: Type 2 diabetes mellitus without complications: E11.9

## 2016-09-12 LAB — CBC WITH DIFFERENTIAL/PLATELET
BASOS ABS: 0 10*3/uL (ref 0.0–0.1)
BASOS PCT: 0 %
EOS PCT: 2 %
Eosinophils Absolute: 0.2 10*3/uL (ref 0.0–0.7)
HCT: 49.5 % (ref 39.0–52.0)
HEMOGLOBIN: 16.7 g/dL (ref 13.0–17.0)
LYMPHS ABS: 2.5 10*3/uL (ref 0.7–4.0)
Lymphocytes Relative: 25 %
MCH: 30.8 pg (ref 26.0–34.0)
MCHC: 33.7 g/dL (ref 30.0–36.0)
MCV: 91.3 fL (ref 78.0–100.0)
Monocytes Absolute: 0.6 10*3/uL (ref 0.1–1.0)
Monocytes Relative: 6 %
NEUTROS PCT: 67 %
Neutro Abs: 6.8 10*3/uL (ref 1.7–7.7)
PLATELETS: 184 10*3/uL (ref 150–400)
RBC: 5.42 MIL/uL (ref 4.22–5.81)
RDW: 13.5 % (ref 11.5–15.5)
WBC: 10.1 10*3/uL (ref 4.0–10.5)

## 2016-09-12 LAB — COMPREHENSIVE METABOLIC PANEL
ALBUMIN: 3.8 g/dL (ref 3.5–5.0)
ALK PHOS: 93 U/L (ref 38–126)
ALT: 66 U/L — AB (ref 17–63)
AST: 56 U/L — ABNORMAL HIGH (ref 15–41)
Anion gap: 15 (ref 5–15)
BUN: 25 mg/dL — ABNORMAL HIGH (ref 6–20)
CHLORIDE: 106 mmol/L (ref 101–111)
CO2: 19 mmol/L — AB (ref 22–32)
CREATININE: 1.83 mg/dL — AB (ref 0.61–1.24)
Calcium: 9.1 mg/dL (ref 8.9–10.3)
GFR calc Af Amer: 43 mL/min — ABNORMAL LOW (ref >60)
GFR calc non Af Amer: 37 mL/min — ABNORMAL LOW (ref >60)
GLUCOSE: 419 mg/dL — AB (ref 65–99)
Potassium: 4.3 mmol/L (ref 3.5–5.1)
SODIUM: 140 mmol/L (ref 135–145)
Total Bilirubin: 1.4 mg/dL — ABNORMAL HIGH (ref 0.3–1.2)
Total Protein: 7 g/dL (ref 6.5–8.1)

## 2016-09-12 LAB — I-STAT VENOUS BLOOD GAS, ED
ACID-BASE DEFICIT: 9 mmol/L — AB (ref 0.0–2.0)
Bicarbonate: 16 mmol/L — ABNORMAL LOW (ref 20.0–28.0)
O2 SAT: 76 %
PCO2 VEN: 30.4 mmHg — AB (ref 44.0–60.0)
PO2 VEN: 43 mmHg (ref 32.0–45.0)
TCO2: 17 mmol/L (ref 0–100)
pH, Ven: 7.329 (ref 7.250–7.430)

## 2016-09-12 LAB — CBG MONITORING, ED
Glucose-Capillary: 413 mg/dL — ABNORMAL HIGH (ref 65–99)
Glucose-Capillary: 280 mg/dL — ABNORMAL HIGH (ref 65–99)

## 2016-09-12 MED ORDER — SODIUM CHLORIDE 0.9 % IV BOLUS (SEPSIS)
1000.0000 mL | Freq: Once | INTRAVENOUS | Status: AC
  Administered 2016-09-12: 1000 mL via INTRAVENOUS

## 2016-09-12 MED ORDER — INSULIN GLARGINE 100 UNIT/ML ~~LOC~~ SOLN
10.0000 [IU] | Freq: Once | SUBCUTANEOUS | Status: AC
  Administered 2016-09-12: 10 [IU] via SUBCUTANEOUS
  Filled 2016-09-12: qty 0.1

## 2016-09-12 MED ORDER — INSULIN GLARGINE 100 UNIT/ML SOLOSTAR PEN: 10.0000 [IU] | PEN_INJECTOR | Freq: Every day | SUBCUTANEOUS | 3 refills | Status: DC

## 2016-09-12 NOTE — Discharge Instructions (Signed)
Starts taking the insulin, 10 units daily as we discussed. Please follow-up with your doctor tomorrow to make sure you're showing some improvement and to discuss further treatment and adjustment of your blood sugar. Return as needed for worsening symptoms.

## 2016-09-12 NOTE — ED Provider Notes (Signed)
[facility] DEPT Provider Note   CSN: [id number] Arrival date & time: 09/12/16  2409     History   Chief Complaint Chief Complaint  Patient presents with  . Hyperglycemia    HPI Jeffrey Holmes is a 66 y.o. male.  HPI Patient presents to the emergency room for evaluation of hyperglycemia. Patient was diagnosed with diabetes earlier this week. He was sent to the emergency room because his blood sugars were in the 800s. In the emergency room he was treated with IV fluids and his blood sugar decreased down into the 400s. Patient was discharged home with prescription for metformin. Patient was unable to his primary care doctor because she is away until Monday. He continues to feel nauseated. His mouth continues to feel dry. His vision is still fuzzy. He does not feel that he is getting much better so he came to the emergency room to be rechecked. Patient denies any trouble with chest pain or shortness of breath. No fevers or chills. No abdominal pain. Past Medical History:  Diagnosis Date  . Benign essential HTN 08/08/2015  . CAD (coronary artery disease), native coronary artery 08/28/2015   Severe multivessel ASCAD s/p CABG hybrid with LIMA to LAD and diag and PCI of the RCA.   [name] Chronic kidney disease, stage 3    multiple kidney stones  . Diabetes mellitus without complication ([place])   . H/O hiatal hernia   . History of acute renal failure 2015   secondary to stones  . Hyperlipidemia 08/08/2015    Patient Active Problem List   Diagnosis Date Noted  . CAD (coronary artery disease), native coronary artery 11/03/2015  . Abnormal result of cardiovascular function study 08/16/2015  . Chronic renal insufficiency, stage III (moderate) 08/16/2015  . Benign essential HTN 08/08/2015  . Hyperlipidemia 08/08/2015  . Hydronephrosis, right 01/06/2014  . Ureteral calculus 01/06/2014    Past Surgical History:  Procedure Laterality Date  . ANGIOPLASTY N/A 08/21/2015   Procedure: ANGIOPLASTY WITH  PERCUTANEOUS CORONARY INTERVENTION WITH DES TO RIGHT CORONARY ARTERY;  Surgeon: Jettie Booze, MD;  Location: [facility];  Service: Cardiovascular;  Laterality: N/A;  . BACK SURGERY  2006   microdisectomy  . CARDIAC CATHETERIZATION N/A 08/18/2015   Procedure: Left Heart Cath and Coronary Angiography;  Surgeon: Jettie Booze, MD;  Location: [facility];  Service: Cardiovascular;  Laterality: N/A;  . CARDIAC CATHETERIZATION N/A 08/21/2015   Procedure: Coronary Stent Intervention;  Surgeon: Jettie Booze, MD;  Location: [facility];  Service: Cardiovascular;  Laterality: N/A;  . CARDIAC CATHETERIZATION  08/21/2015   Procedure: Bypass Graft Angiography;  Surgeon: Jettie Booze, MD;  Location: [facility];  Service: Cardiovascular;;  . CARDIOVASCULAR STRESS TEST  08/15/2015  . CORONARY ARTERY BYPASS GRAFT N/A 08/21/2015   Procedure: OFF PUMP CORONARY ARTERY BYPASS GRAFTING (CABG), TIMES TWO, USING LEFT INTERNAL MAMMARY ARTERY, RIGHT GREATER SAPHENOUS VEIN HARVESTED ENDOSCOPICALLY;  Surgeon: [name], MD;  Location: [facility];  Service: Open Heart Surgery;  Laterality: N/A;  . CYSTOSCOPY WITH RETROGRADE PYELOGRAM, URETEROSCOPY AND STENT PLACEMENT Right 01/06/2014   Procedure: CYSTOSCOPY WITH RETROGRADE PYELOGRAM, URETEROSCOPY AND STENT PLACEMENT;  Surgeon: Bernestine Amass, MD;  Location: [facility];  Service: Urology;  Laterality: Right;  . LITHOTRIPSY     2-3 times in past  . TEE WITHOUT CARDIOVERSION N/A 08/21/2015   Procedure: TRANSESOPHAGEAL ECHOCARDIOGRAM (TEE);  Surgeon: [name], MD;  Location: [facility];  Service: Open Heart Surgery;  Laterality: N/A;       Home Medications    Prior to Admission medications   Medication Sig Start Date End Date Taking? Authorizing Provider  amLODipine (NORVASC) 5 MG tablet Take 1 tablet (5 mg total) by mouth daily. 12/12/15  Yes Sueanne Margarita, MD  aspirin EC 81 MG tablet Take 1 tablet (81 mg total) by mouth daily.  08/25/15  Yes Erin R Barrett, PA-C  atorvastatin (LIPITOR) 80 MG tablet Take 1 tablet (80 mg total) by mouth daily. 11/07/15  Yes Sueanne Margarita, MD  clopidogrel (PLAVIX) 75 MG tablet Take 1 tablet (75 mg total) by mouth daily. 01/01/16  Yes Sueanne Margarita, MD  metFORMIN (GLUCOPHAGE) 850 MG tablet Take 1 tablet (850 mg total) by mouth 2 (two) times daily. 09/11/16  Yes Karma Greaser, MD  metoprolol succinate (TOPROL-XL) 50 MG 24 hr tablet Take 1 tablet (50 mg total) by mouth 2 (two) times daily. Take with or immediately following a meal. Patient taking differently: Take 50 mg by mouth every morning. Take with or immediately following a meal. 01/03/16  Yes Burtis Junes, NP  nitroGLYCERIN (NITROSTAT) 0.4 MG SL tablet Place 0.4 mg under the tongue as needed. Place 1 tablet under your tongue every 5 minutes as needed for chest pain for a max of 3 doses 08/08/15  Yes Historical Provider, MD  ondansetron (ZOFRAN ODT) 4 MG disintegrating tablet Take 1 tablet (4 mg total) by mouth every 8 (eight) hours as needed for nausea or vomiting. 09/06/16  Yes Lysbeth Penner, FNP  timolol (TIMOPTIC) 0.5 % ophthalmic solution Place 1 drop into both eyes 2 (two) times daily.  07/19/15  Yes Historical Provider, MD  travoprost, benzalkonium, (TRAVATAN) 0.004 % ophthalmic solution Place 1 drop into both eyes at bedtime.   Yes Historical Provider, MD  Insulin Glargine (LANTUS) 100 UNIT/ML Solostar Pen Inject 10 Units into the skin daily at 10 pm. 09/12/16   Dorie Rank, MD    Family History Family History  Problem Relation Age of Onset  . Kidney failure Mother   . Heart attack Mother     angina   . Throat cancer Father     Social History Social History  Substance Use Topics  . Smoking status: Never Smoker  . Smokeless tobacco: Never Used  . Alcohol use Yes     Comment: occasional every 2-3 months     Allergies   Vicodin [hydrocodone-acetaminophen]; Hydrocodone; Oxycontin [oxycodone hcl]; and Percocet  [oxycodone-acetaminophen]   Review of Systems Review of Systems  All other systems reviewed and are negative.    Physical Exam Updated Vital Signs BP 151/97   Pulse 65   Temp 98.8 F (37.1 C) (Oral)   Resp 22   Ht 5\' 5"  (1.651 m)   Wt 83.9 kg   SpO2 96%   BMI 30.79 kg/m   Physical Exam  Constitutional: He appears well-developed and well-nourished. No distress.  HENT:  Head: Normocephalic and atraumatic.  Right Ear: External ear normal.  Left Ear: External ear normal.  Mouth/Throat: No oropharyngeal exudate.  Eyes: Conjunctivae are normal. Right eye exhibits no discharge. Left eye exhibits no discharge. No scleral icterus.  Neck: Neck supple. No tracheal deviation present.  Cardiovascular: Normal rate, regular rhythm and intact distal pulses.   Pulmonary/Chest: Effort normal and breath sounds normal. No stridor. No respiratory distress. He has no wheezes. He has no rales.  Abdominal: Soft. Bowel sounds are normal. He exhibits no distension. There is no  tenderness. There is no rebound and no guarding.  Musculoskeletal: He exhibits no edema or tenderness.  Neurological: He is alert. He has normal strength. No sensory deficit. Cranial nerve deficit: no gross deficits. He exhibits normal muscle tone. He displays no seizure activity. Coordination normal.  Skin: Skin is warm and dry. No rash noted.  Psychiatric: He has a normal mood and affect.  Nursing note and vitals reviewed.    ED Treatments / Results  Labs (all labs ordered are listed, but only abnormal results are displayed) Labs Reviewed  COMPREHENSIVE METABOLIC PANEL - Abnormal; Notable for the following:       Result Value   CO2 19 (*)    Glucose, Bld 419 (*)    BUN 25 (*)    Creatinine, Ser 1.83 (*)    AST 56 (*)    ALT 66 (*)    Total Bilirubin 1.4 (*)    GFR calc non Af Amer 37 (*)    GFR calc Af Amer 43 (*)    All other components within normal limits  CBG MONITORING, ED - Abnormal; Notable for the  following:    Glucose-Capillary 413 (*)    All other components within normal limits  I-STAT VENOUS BLOOD GAS, ED - Abnormal; Notable for the following:    pCO2, Ven 30.4 (*)    Bicarbonate 16.0 (*)    Acid-base deficit 9.0 (*)    All other components within normal limits  CBC WITH DIFFERENTIAL/PLATELET    Procedures Procedures (including critical care time)  Medications Ordered in ED Medications  insulin glargine (LANTUS) injection 10 Units (not administered)  sodium chloride 0.9 % bolus 1,000 mL (1,000 mLs Intravenous New Bag/Given 09/12/16 2127)     Initial Impression / Assessment and Plan / ED Course  I have reviewed the triage vital signs and the nursing notes.  Pertinent labs & imaging results that were available during my care of the patient were reviewed by me and considered in my medical decision making (see chart for details).   patient's laboratory tests show some improvement in his renal function from 2 days ago. His creatinine initially was 2.46 but decreased to 1.83. His bicarbonate is increased at 19 but his anion gap is decreasing from 2 days ago. Venous blood gas in the emergency room does not show any evidence of acidosis.  I will start the patient on insulin. I gave him a dose of 10 units Lantus subcutaneous.  I will have him take 10 units daily and I stressed the importance of having him see his primary care doctor tomorrow.     Final Clinical Impressions(s) / ED Diagnoses   Final diagnoses:  Hyperglycemia    New Prescriptions New Prescriptions   INSULIN GLARGINE (LANTUS) 100 UNIT/ML SOLOSTAR PEN    Inject 10 Units into the skin daily at 10 pm.     Dorie Rank, MD 09/12/16 [phone]

## 2016-09-12 NOTE — ED Triage Notes (Signed)
Pt was seen here on 2/13 for new onset diabetes.  Pt st's he is not feeling any better.  Continues to c/o dizziness and dry heaves.  St's today his CBG was 458

## 2016-09-12 NOTE — ED Notes (Signed)
Pt departed in NAD. Reviewed d/c instructions, as well as information on use of insulin pens and proper injection methods.

## 2016-09-16 DIAGNOSIS — K5730 Diverticulosis of large intestine without perforation or abscess without bleeding: Secondary | ICD-10-CM | POA: Diagnosis not present

## 2016-09-16 DIAGNOSIS — K760 Fatty (change of) liver, not elsewhere classified: Secondary | ICD-10-CM | POA: Diagnosis not present

## 2016-09-16 DIAGNOSIS — E1365 Other specified diabetes mellitus with hyperglycemia: Secondary | ICD-10-CM | POA: Diagnosis not present

## 2016-09-16 DIAGNOSIS — D739 Disease of spleen, unspecified: Secondary | ICD-10-CM | POA: Diagnosis not present

## 2016-09-16 DIAGNOSIS — R945 Abnormal results of liver function studies: Secondary | ICD-10-CM | POA: Diagnosis not present

## 2016-09-16 DIAGNOSIS — R631 Polydipsia: Secondary | ICD-10-CM | POA: Diagnosis not present

## 2016-09-16 DIAGNOSIS — R160 Hepatomegaly, not elsewhere classified: Secondary | ICD-10-CM | POA: Diagnosis not present

## 2016-09-16 DIAGNOSIS — R112 Nausea with vomiting, unspecified: Secondary | ICD-10-CM | POA: Diagnosis not present

## 2016-09-16 DIAGNOSIS — K769 Liver disease, unspecified: Secondary | ICD-10-CM | POA: Diagnosis not present

## 2016-09-16 DIAGNOSIS — N3944 Nocturnal enuresis: Secondary | ICD-10-CM | POA: Diagnosis not present

## 2016-09-16 DIAGNOSIS — Z79899 Other long term (current) drug therapy: Secondary | ICD-10-CM | POA: Diagnosis not present

## 2016-09-19 ENCOUNTER — Ambulatory Visit (INDEPENDENT_AMBULATORY_CARE_PROVIDER_SITE_OTHER): Payer: Medicare Other | Admitting: Physician Assistant

## 2016-09-19 ENCOUNTER — Encounter: Payer: Self-pay | Admitting: Physician Assistant

## 2016-09-19 ENCOUNTER — Other Ambulatory Visit (INDEPENDENT_AMBULATORY_CARE_PROVIDER_SITE_OTHER): Payer: Medicare Other

## 2016-09-19 VITALS — BP 142/84 | HR 70 | Ht 60.5 in | Wt 172.0 lb

## 2016-09-19 DIAGNOSIS — R932 Abnormal findings on diagnostic imaging of liver and biliary tract: Secondary | ICD-10-CM

## 2016-09-19 DIAGNOSIS — E1165 Type 2 diabetes mellitus with hyperglycemia: Secondary | ICD-10-CM | POA: Diagnosis not present

## 2016-09-19 DIAGNOSIS — K760 Fatty (change of) liver, not elsewhere classified: Secondary | ICD-10-CM

## 2016-09-19 LAB — COMPREHENSIVE METABOLIC PANEL
ALK PHOS: 122 U/L — AB (ref 39–117)
ALT: 70 U/L — AB (ref 0–53)
AST: 50 U/L — ABNORMAL HIGH (ref 0–37)
Albumin: 4.6 g/dL (ref 3.5–5.2)
BILIRUBIN TOTAL: 0.7 mg/dL (ref 0.2–1.2)
BUN: 18 mg/dL (ref 6–23)
CO2: 24 meq/L (ref 19–32)
Calcium: 10 mg/dL (ref 8.4–10.5)
Chloride: 104 mEq/L (ref 96–112)
Creatinine, Ser: 1.66 mg/dL — ABNORMAL HIGH (ref 0.40–1.50)
GFR: 53.42 mL/min — ABNORMAL LOW (ref >60.00)
GLUCOSE: 387 mg/dL — AB (ref 70–99)
Potassium: 3.8 mEq/L (ref 3.5–5.1)
SODIUM: 143 meq/L (ref 135–145)
TOTAL PROTEIN: 7.9 g/dL (ref 6.0–8.3)

## 2016-09-19 LAB — PROTIME-INR
INR: 1 ratio (ref 0.8–1.0)
Prothrombin Time: 10.6 s (ref 9.6–13.1)

## 2016-09-19 LAB — FERRITIN: Ferritin: 398.5 ng/mL — ABNORMAL HIGH (ref 22.0–322.0)

## 2016-09-19 NOTE — Patient Instructions (Signed)
If you are age 67 or older, your body mass index should be between 23-30. Your Body mass index is 33.04 kg/m. If this is out of the aforementioned range listed, please consider follow up with your Primary Care Provider.  If you are age 52 or younger, your body mass index should be between 19-25. Your Body mass index is 33.04 kg/m. If this is out of the aformentioned range listed, please consider follow up with your Primary Care Provider.   Your physician has requested that you go to the basement for lab work before leaving today.  You have been scheduled for an MRI at [facility] on 09-29-2016. Your appointment time is 1040am. Please arrive 15 minutes prior to your appointment time for registration purposes. Please make certain not to have anything to eat or drink 6 hours prior to your test. In addition, if you have any metal in your body, have a pacemaker or defibrillator, please be sure to let your ordering physician know. This test typically takes 45 minutes to 1 hour to complete.  [facility] appointment is pending the referral.  Thank you for choosing [facility]

## 2016-09-19 NOTE — Progress Notes (Addendum)
Subjective:    Patient ID: Jeffrey Holmes, male    DOB: 1950/09/05, 66 y.o.   MRN: [id number]  HPI Jeffrey Holmes is a pleasant 66 year old African-American male, new to GI today known very very remotely to Dr. Clarene Duke colonoscopy done many years ago. He is referred today [name] NP/[facility]. He comes in today regarding recently noted abnormal LFTs and an abnormal CT of the abdomen and pelvis. Patient had an ER visit on 09/06/2016, diagnosed with a gastroenteritis and that was back in the ER on 213 and 09/12/2016 and at that time noted to have a glucose in excess of 800 and has been diagnosed with diabetes and started on insulin. Labs done on 09/12/1998 1902 and total bili of 1.4, AST 56 ALT of 66 and glucose of 413. Reviewing prior labs LFTs noted in December 2017 normal. Because of the abnormal LFTs his primary care ordered CT of the abdomen and pelvis without IV contrast. This was done through [facility] health and shows hepatomegaly with severe hepatic steatosis and indeterminate lesions in the liver measuring up to 3.1 cm, MRI recommended, nonobstructing bilateral nephrolithiasis and colonic diverticulosis. Patient has no prior history of liver disease that he is aware of and had not been told in the past that he had fatty liver disease. He has never used alcohol. He has no family history of liver disease that he is aware of. He does have history of hypertension, coronary artery disease and is status post CABG 2 within the past 2 years also has chronic kidney disease. He has no complaints of abdominal pain. He says he still doesn't feel well in general and is fatigued which she nowhas been secondary to his elevated blood sugars. He had previously been having a lot of nausea and dry heaves which has improved and he is able to eat.  Review of Systems Pertinent positive and negative review of systems were noted in the above HPI section.  All other review of systems was otherwise  negative.  Outpatient Encounter Prescriptions as of 09/19/2016  Medication Sig  . amLODipine (NORVASC) 5 MG tablet Take 1 tablet (5 mg total) by mouth daily.  [name] aspirin EC 81 MG tablet Take 1 tablet (81 mg total) by mouth daily.  [name] atorvastatin (LIPITOR) 80 MG tablet Take 1 tablet (80 mg total) by mouth daily.  . clopidogrel (PLAVIX) 75 MG tablet Take 1 tablet (75 mg total) by mouth daily.  . Insulin Glargine (LANTUS) 100 UNIT/ML Solostar Pen Inject 10 Units into the skin daily at 10 pm.  . metFORMIN (GLUCOPHAGE) 850 MG tablet Take 1 tablet (850 mg total) by mouth 2 (two) times daily.  . metoprolol succinate (TOPROL-XL) 50 MG 24 hr tablet Take 1 tablet (50 mg total) by mouth 2 (two) times daily. Take with or immediately following a meal. (Patient taking differently: Take 50 mg by mouth every morning. Take with or immediately following a meal.)  . nitroGLYCERIN (NITROSTAT) 0.4 MG SL tablet Place 0.4 mg under the tongue as needed. Place 1 tablet under your tongue every 5 minutes as needed for chest pain for a max of 3 doses  . nystatin (MYCOSTATIN) 100000 UNIT/ML suspension Take 5 mLs by mouth 4 (four) times daily.  . ondansetron (ZOFRAN ODT) 4 MG disintegrating tablet Take 1 tablet (4 mg total) by mouth every 8 (eight) hours as needed for nausea or vomiting.  . timolol (TIMOPTIC) 0.5 % ophthalmic solution Place 1 drop into both eyes 2 (  two) times daily.   . travoprost, benzalkonium, (TRAVATAN) 0.004 % ophthalmic solution Place 1 drop into both eyes at bedtime.   No facility-administered encounter medications on file as of 09/19/2016.    Allergies  Allergen Reactions  . Vicodin [Hydrocodone-Acetaminophen] Nausea And Vomiting  . Hydrocodone Nausea And Vomiting  . Oxycontin [Oxycodone Hcl] Nausea And Vomiting  . Percocet [Oxycodone-Acetaminophen] Nausea And Vomiting   Patient Active Problem List   Diagnosis Date Noted  . CAD (coronary artery disease), native coronary artery 11/03/2015  .  Abnormal result of cardiovascular function study 08/16/2015  . Chronic renal insufficiency, stage III (moderate) 08/16/2015  . Benign essential HTN 08/08/2015  . Hyperlipidemia 08/08/2015  . Hydronephrosis, right 01/06/2014  . Ureteral calculus 01/06/2014   Social History   Social History  . Marital status: Married    Spouse name: N/A  . Number of children: N/A  . Years of education: N/A   Occupational History  . Not on file.   Social History Main Topics  . Smoking status: Never Smoker  . Smokeless tobacco: Never Used  . Alcohol use Yes     Comment: occasional every 2-3 months  . Drug use: No  . Sexual activity: Not on file   Other Topics Concern  . Not on file   Social History Narrative  . No narrative on file    Mr. Firkus family history includes Heart attack in his mother; Kidney failure in his mother; Throat cancer in his father.      Objective:    Vitals:   09/19/16 1024  BP: (!) 142/84  Pulse: 70    Physical Exam  well-developed African American male in no acute distress, pleasant blood pressure 142/84 pulse 70, height 5 foot, weight 172, BMI 33.0. HEENT; nontraumatic normocephalic EOMI PERRLA sclerae anicteric, Cardiovascular; regular rate and rhythm with S1-S2 no murmur rub or gallop he does have the sternal incisional scar, Pulm; clear bilaterally, Abdomen ;soft nontender nondistended, bowel sounds are present there is no palpable mass or hepatosplenomegaly on Rectal; exam not done, Extremities; no clubbing cyanosis or edema skin warm and dry, Neuropsych; mood and affect appropriate       Assessment & Plan:   #17 66 year old African-American male with new diagnosis of type 2 diabetes marked hyperglycemia #2 mild transaminitis-nonspecific-, possibly secondary to [place] #3 marked Hepatic steatosis on CT and 2 indeterminate liver lesions. Suspect he has had underlying steatohepatitis/[place]-no evidence of cirrhosis by CT-rule out hepatic neoplasm #4 chronic  kidney disease #5 coronary artery disease status post CABG  Plan; Schedule for MRI of the liver Check repeat hepatic panel, ProTime/INR, alpha-fetoprotein, we'll also check chronic hepatic markers to exclude other causes of chronic liver disease to include ferritin, AMA, ceruloplasmin, alpha-1 antitrypsin, AMA. Pt will be established with Dr. Carlean Purl, and we will schedule for follow-up office visit to review all of above findings. We'll also refer him to [facility] for management of his new diagnosis of diabetes.  Janan Bogie S Zully Frane PA-C 09/19/2016   Cc: Nanci Pina, FNP  Agree with Ms. Genia Harold assessment and plan.  Hoepfully liver lesions are not malignant. Diabetes control of paramount importance to his health.  Gatha Mayer, MD, [facility]

## 2016-09-20 ENCOUNTER — Encounter: Payer: Self-pay | Admitting: Endocrinology

## 2016-09-20 ENCOUNTER — Telehealth: Payer: Self-pay | Admitting: Endocrinology

## 2016-09-20 ENCOUNTER — Ambulatory Visit (INDEPENDENT_AMBULATORY_CARE_PROVIDER_SITE_OTHER): Payer: Medicare Other | Admitting: Endocrinology

## 2016-09-20 DIAGNOSIS — E1059 Type 1 diabetes mellitus with other circulatory complications: Secondary | ICD-10-CM | POA: Diagnosis not present

## 2016-09-20 LAB — MITOCHONDRIAL ANTIBODIES: Mitochondrial M2 Ab, IgG: <=20 Units (ref <=20.0)

## 2016-09-20 LAB — HEPATITIS C ANTIBODY: HCV AB: NEGATIVE

## 2016-09-20 LAB — HEPATITIS B SURFACE ANTIBODY,QUALITATIVE: Hep B S Ab: NEGATIVE

## 2016-09-20 LAB — HEPATITIS B SURFACE ANTIGEN: Hepatitis B Surface Ag: NEGATIVE

## 2016-09-20 LAB — ANTI-SMOOTH MUSCLE ANTIBODY, IGG: Smooth Muscle Ab: <20 U (ref <20)

## 2016-09-20 LAB — ANA: Anti Nuclear Antibody(ANA): NEGATIVE

## 2016-09-20 LAB — AFP TUMOR MARKER: AFP-Tumor Marker: 3.6 ng/mL (ref <6.1)

## 2016-09-20 MED ORDER — INSULIN LISPRO 100 UNIT/ML (KWIKPEN): 4.0000 [IU] | PEN_INJECTOR | Freq: Three times a day (TID) | SUBCUTANEOUS | 11 refills | Status: DC

## 2016-09-20 MED ORDER — BASAGLAR KWIKPEN 100 UNIT/ML ~~LOC~~ SOPN: 10.0000 [IU] | PEN_INJECTOR | Freq: Every day | SUBCUTANEOUS | 11 refills | Status: DC

## 2016-09-20 NOTE — Patient Instructions (Addendum)
good diet and exercise significantly improve the control of your diabetes.  please let me know if you wish to be referred to a dietician.  high blood sugar is very risky to your health.  you should see an eye doctor and dentist every year.  It is very important to get all recommended vaccinations.  Controlling your blood pressure and cholesterol drastically reduces the damage diabetes does to your body.  Those who smoke should quit.  Please discuss these with your doctor.  check your blood sugar 4 times a day: before the 3 meals, and at bedtime.  also check if you have symptoms of your blood sugar being too high or too low.  please keep a record of the readings and bring it to your next appointment here (or you can bring the meter itself).  You can write it on any piece of paper.  please call us sooner if your blood sugar goes below 70, or if you have a lot of readings over 200.  Please continue the same basaglar, and:  Add humalog, 4 units 3 times a day (just before each meal).  Please come back for a follow-up appointment in 2 weeks.

## 2016-09-20 NOTE — Progress Notes (Signed)
Subjective:    Patient ID: Jeffrey Holmes, male    DOB: 10/12/1950, 66 y.o.   MRN: [id number]  HPI pt is referred Priscille Loveless, NP, by for diabetes.  DM was dx'ed a few weeks ago, when pt presented with urine ketones and severe hyperglycemia; he has mild if any neuropathy of the lower extremities, but he has associated CAD; he has been on insulin since 1 week ago; pt says his diet is fair, but exercise is good; he has never ha pancreatitis, severe hypoglycemia.  He takes lantus, 10 units.  He says cbg vary from 210-500.  It is in general higher as the day goes on.   Past Medical History:  Diagnosis Date  . Benign essential HTN 08/08/2015  . CAD (coronary artery disease), native coronary artery 08/28/2015   Severe multivessel ASCAD s/p CABG hybrid with LIMA to LAD and diag and PCI of the RCA.   [name] Chronic kidney disease, stage 3    multiple kidney stones  . Diabetes mellitus without complication ([place])   . H/O hiatal hernia   . History of acute renal failure 2015   secondary to stones  . Hyperlipidemia 08/08/2015    Past Surgical History:  Procedure Laterality Date  . ANGIOPLASTY N/A 08/21/2015   Procedure: ANGIOPLASTY WITH PERCUTANEOUS CORONARY INTERVENTION WITH DES TO RIGHT CORONARY ARTERY;  Surgeon: Jettie Booze, MD;  Location: [place];  Service: Cardiovascular;  Laterality: N/A;  . BACK SURGERY  2006   microdisectomy  . CARDIAC CATHETERIZATION N/A 08/18/2015   Procedure: Left Heart Cath and Coronary Angiography;  Surgeon: Jettie Booze, MD;  Location: [place] [facility];  Service: Cardiovascular;  Laterality: N/A;  . CARDIAC CATHETERIZATION N/A 08/21/2015   Procedure: Coronary Stent Intervention;  Surgeon: Jettie Booze, MD;  Location: [place] [facility];  Service: Cardiovascular;  Laterality: N/A;  . CARDIAC CATHETERIZATION  08/21/2015   Procedure: Bypass Graft Angiography;  Surgeon: Jettie Booze, MD;  Location: [place] [facility];  Service: Cardiovascular;;  .  CARDIOVASCULAR STRESS TEST  08/15/2015  . CORONARY ARTERY BYPASS GRAFT N/A 08/21/2015   Procedure: OFF PUMP CORONARY ARTERY BYPASS GRAFTING (CABG), TIMES TWO, USING LEFT INTERNAL MAMMARY ARTERY, RIGHT GREATER SAPHENOUS VEIN HARVESTED ENDOSCOPICALLY;  Surgeon: [name], MD;  Location: [place];  Service: Open Heart Surgery;  Laterality: N/A;  . CYSTOSCOPY WITH RETROGRADE PYELOGRAM, URETEROSCOPY AND STENT PLACEMENT Right 01/06/2014   Procedure: CYSTOSCOPY WITH RETROGRADE PYELOGRAM, URETEROSCOPY AND STENT PLACEMENT;  Surgeon: Bernestine Amass, MD;  Location: [facility];  Service: Urology;  Laterality: Right;  . LITHOTRIPSY     2-3 times in past  . TEE WITHOUT CARDIOVERSION N/A 08/21/2015   Procedure: TRANSESOPHAGEAL ECHOCARDIOGRAM (TEE);  Surgeon: [name], MD;  Location: [place];  Service: Open Heart Surgery;  Laterality: N/A;    Social History   Social History  . Marital status: Married    Spouse name: N/A  . Number of children: N/A  . Years of education: N/A   Occupational History  . Not on file.   Social History Main Topics  . Smoking status: Never Smoker  . Smokeless tobacco: Never Used  . Alcohol use Yes     Comment: occasional every 2-3 months  . Drug use: No  . Sexual activity: Not on file   Other Topics Concern  . Not on file   Social History Narrative  . No narrative on file    Current Outpatient Prescriptions on File Prior  to Visit  Medication Sig Dispense Refill  . amLODipine (NORVASC) 5 MG tablet Take 1 tablet (5 mg total) by mouth daily. 90 tablet 3  . aspirin EC 81 MG tablet Take 1 tablet (81 mg total) by mouth daily.    [name] atorvastatin (LIPITOR) 80 MG tablet Take 1 tablet (80 mg total) by mouth daily. 90 tablet 3  . clopidogrel (PLAVIX) 75 MG tablet Take 1 tablet (75 mg total) by mouth daily. 30 tablet 11  . metFORMIN (GLUCOPHAGE) 850 MG tablet Take 1 tablet (850 mg total) by mouth 2 (two) times daily. 10 tablet 0  . metoprolol succinate (TOPROL-XL) 50 MG  24 hr tablet Take 1 tablet (50 mg total) by mouth 2 (two) times daily. Take with or immediately following a meal. (Patient taking differently: Take 50 mg by mouth every morning. Take with or immediately following a meal.) 180 tablet 3  . nystatin (MYCOSTATIN) 100000 UNIT/ML suspension Take 5 mLs by mouth 4 (four) times daily.    . ondansetron (ZOFRAN ODT) 4 MG disintegrating tablet Take 1 tablet (4 mg total) by mouth every 8 (eight) hours as needed for nausea or vomiting. 20 tablet 0  . timolol (TIMOPTIC) 0.5 % ophthalmic solution Place 1 drop into both eyes 2 (two) times daily.     . travoprost, benzalkonium, (TRAVATAN) 0.004 % ophthalmic solution Place 1 drop into both eyes at bedtime.    . nitroGLYCERIN (NITROSTAT) 0.4 MG SL tablet Place 0.4 mg under the tongue as needed. Place 1 tablet under your tongue every 5 minutes as needed for chest pain for a max of 3 doses     No current facility-administered medications on file prior to visit.     Allergies  Allergen Reactions  . Vicodin [Hydrocodone-Acetaminophen] Nausea And Vomiting  . Hydrocodone Nausea And Vomiting  . Oxycontin [Oxycodone Hcl] Nausea And Vomiting  . Percocet [Oxycodone-Acetaminophen] Nausea And Vomiting    Family History  Problem Relation Age of Onset  . Kidney failure Mother   . Heart attack Mother     angina   . Diabetes Mother   . Throat cancer Father   . Diabetes Sister     BP 136/76   Pulse 78   Ht 5' 0.5" (1.537 m)   Wt 174 lb (78.9 kg)   SpO2 96%   BMI 33.42 kg/m    Review of Systems denies headache, chest pain, sob, muscle cramps, excessive diaphoresis, depression, cold intolerance, rhinorrhea, and easy bruising.  He has lost 15 lbs. He has slight blurry vision, urinary frequency, and nausea.     Objective:   Physical Exam VS: see vs page GEN: no distress HEAD: head: no deformity eyes: no periorbital swelling, no proptosis external nose and ears are normal mouth: no lesion seen NECK: supple,  thyroid is not enlarged CHEST WALL: no deformity LUNGS: clear to auscultation CV: reg rate and rhythm, no murmur ABD: abdomen is soft, nontender.  no hepatosplenomegaly.  not distended.  no hernia MUSCULOSKELETAL: muscle bulk and strength are grossly normal.  no obvious joint swelling.  gait is normal and steady EXTEMITIES: no deformity.  no ulcer on the feet.  feet are of normal color and temp.  no edema.  There is bilateral onychomycosis of the toenails.  Old healed surgical scar (vein harvest) at the right leg.   PULSES: dorsalis pedis intact bilat.  no carotid bruit NEURO:  cn 2-12 grossly intact.   readily moves all 4's.  sensation is intact to touch on  the feet SKIN:  Normal texture and temperature.  No rash or suspicious lesion is visible.   NODES:  None palpable at the neck PSYCH: alert, well-oriented.  Does not appear anxious nor depressed.   Lab Results  Component Value Date   HGBA1C 6.9 (H) 08/19/2015   I have reviewed outside records, and summarized: Pt was seen again in ER, after being rx'ed metformin.  Insulin was then started.   I personally reviewed electrocardiogram tracing (09/10/16): Indication: severe hyperglycemia Impression: NSR.  No MI.  LAE Compared to 01/03/16: SB is resolved.     Assessment & Plan:  Type 1 DM: severe exacerbation Patient is advised the following: Patient Instructions  good diet and exercise significantly improve the control of your diabetes.  please let me know if you wish to be referred to a dietician.  high blood sugar is very risky to your health.  you should see an eye doctor and dentist every year.  It is very important to get all recommended vaccinations.  Controlling your blood pressure and cholesterol drastically reduces the damage diabetes does to your body.  Those who smoke should quit.  Please discuss these with your doctor.  check your blood sugar 4 times a day: before the 3 meals, and at bedtime.  also check if you have symptoms of  your blood sugar being too high or too low.  please keep a record of the readings and bring it to your next appointment here (or you can bring the meter itself).  You can write it on any piece of paper.  please call us sooner if your blood sugar goes below 70, or if you have a lot of readings over 200.  Please continue the same basaglar, and:  Add humalog, 4 units 3 times a day (just before each meal).  Please come back for a follow-up appointment in 2 weeks.

## 2016-09-20 NOTE — Telephone Encounter (Signed)
4 PM today, please

## 2016-09-20 NOTE — Telephone Encounter (Signed)
LM for pt with the request to come in today at 4 pm.

## 2016-09-23 ENCOUNTER — Telehealth: Payer: Self-pay | Admitting: Endocrinology

## 2016-09-23 LAB — ALPHA-1-ANTITRYPSIN: A1 ANTITRYPSIN SER: 136 mg/dL (ref 83–199)

## 2016-09-23 LAB — CERULOPLASMIN: Ceruloplasmin: 34 mg/dL (ref 18–36)

## 2016-09-23 NOTE — Telephone Encounter (Signed)
I contacted the patient. Patient stated he has not been experiencing any shortness of breath or vomiting, but has has mild nausea. Patient agreed to new dosage and was advised to drink plenty of fluids. Patient was also instructed to call back on Thursday or Friday to report progress. Patient voiced understanding and had no further questions at this time.

## 2016-09-23 NOTE — Telephone Encounter (Signed)
I attempted to reach the patient. Patient was not available. Left a voicemail requesting a call back from the patient to discuss his blood sugar readings.

## 2016-09-23 NOTE — Telephone Encounter (Signed)
See message and please advise, Thanks!  

## 2016-09-23 NOTE — Telephone Encounter (Signed)
Patient do not have much of an appetite, feels run down and weak yesterday b/s was 600, and this morning is was 525. Please advise

## 2016-09-23 NOTE — Telephone Encounter (Signed)
Lease verify no n/v/sob Then please increase basaglar to 15 units daily, and: Increase humalog to 5 units 3 times a day (just before each meal). I'll see you next time.

## 2016-09-26 ENCOUNTER — Other Ambulatory Visit: Payer: Medicare Other | Admitting: *Deleted

## 2016-09-26 ENCOUNTER — Telehealth: Payer: Self-pay | Admitting: Nutrition

## 2016-09-26 DIAGNOSIS — E785 Hyperlipidemia, unspecified: Secondary | ICD-10-CM

## 2016-09-26 LAB — LIPID PANEL
CHOLESTEROL TOTAL: 199 mg/dL (ref 100–199)
Chol/HDL Ratio: 5.9 ratio units — ABNORMAL HIGH (ref 0.0–5.0)
HDL: 34 mg/dL — AB (ref >39)
LDL Calculated: 118 mg/dL — ABNORMAL HIGH (ref 0–99)
Triglycerides: 235 mg/dL — ABNORMAL HIGH (ref 0–149)
VLDL CHOLESTEROL CAL: 47 mg/dL — AB (ref 5–40)

## 2016-09-26 LAB — ALT: ALT: 70 IU/L — AB (ref 0–44)

## 2016-09-26 NOTE — Telephone Encounter (Signed)
Patient returning your call.

## 2016-09-26 NOTE — Addendum Note (Signed)
Addended by: Eulis Foster on: 09/26/2016 07:58 AM   Modules accepted: Orders

## 2016-09-27 NOTE — Telephone Encounter (Signed)
Requested a call back to further discuss.  

## 2016-09-27 NOTE — Telephone Encounter (Signed)
Please continue basaglar 15 units daily, and: Increase humalog to 8 units 3 times a day (just before each meal).

## 2016-09-27 NOTE — Telephone Encounter (Signed)
09/23/2016: 9 am: 371 1 pm:228 10 pm:187   09/24/2016  9:30 am 278  12 pm: 405  615 pm: 371  11:15 356  09/25/2016  11 am: 341 4pm: 396  09/26/2016 715 am: 295  2 pm :507 6 pm: 445  09/27/2016 950 am: 370 1pm: 452 Patient confirmed he is currently taking basaglar 15 units daily, and: humalog 5 units 3 times a day (just before each meal). Please advise, Thanks!

## 2016-09-27 NOTE — Telephone Encounter (Signed)
I contacted the patient and advised of new instructions. Patient voiced understanding and will call back next week to report progress.

## 2016-09-29 ENCOUNTER — Ambulatory Visit
Admission: RE | Admit: 2016-09-29 | Discharge: 2016-09-29 | Disposition: A | Discharge status: Home / Self Care | Payer: Medicare Other | Source: Ambulatory Visit | Attending: Physician Assistant | Admitting: Physician Assistant

## 2016-09-29 DIAGNOSIS — K7689 Other specified diseases of liver: Secondary | ICD-10-CM | POA: Diagnosis not present

## 2016-09-29 MED ORDER — GADOBENATE DIMEGLUMINE 529 MG/ML IV SOLN
15.0000 mL | Freq: Once | INTRAVENOUS | Status: AC | PRN
  Administered 2016-09-29: 15 mL via INTRAVENOUS

## 2016-10-07 ENCOUNTER — Encounter: Payer: Medicare Other | Attending: Endocrinology | Admitting: Nutrition

## 2016-10-07 ENCOUNTER — Telehealth: Payer: Self-pay | Admitting: Endocrinology

## 2016-10-07 DIAGNOSIS — N183 Chronic kidney disease, stage 3 unspecified: Secondary | ICD-10-CM

## 2016-10-07 DIAGNOSIS — E1059 Type 1 diabetes mellitus with other circulatory complications: Secondary | ICD-10-CM | POA: Insufficient documentation

## 2016-10-07 NOTE — Telephone Encounter (Signed)
please call patient: Jeffrey Holmes says your blood sugar is high Please increase lantus to 20 units daily, and increase humalog to 10 units 3 times a day (just before each meal). I'll see you next time.

## 2016-10-07 NOTE — Telephone Encounter (Signed)
-----   Message from Ocie Doyne, RN sent at 10/07/2016 10:07 AM EDT ----- I saw Jeffrey Holmes this morning.  His FBSs are running between 260-295.  He is taking 15u of Lantus and 8u of Humalog pc meals. We discussed some diet changes that will help, but do you want to increase his Lantus dose?

## 2016-10-07 NOTE — Telephone Encounter (Signed)
I contacted the patient and advised of message. He voiced understanding and had no further questions at this time.  

## 2016-10-08 ENCOUNTER — Telehealth: Payer: Self-pay

## 2016-10-08 DIAGNOSIS — L820 Inflamed seborrheic keratosis: Secondary | ICD-10-CM | POA: Diagnosis not present

## 2016-10-08 DIAGNOSIS — D492 Neoplasm of unspecified behavior of bone, soft tissue, and skin: Secondary | ICD-10-CM | POA: Diagnosis not present

## 2016-10-08 NOTE — Telephone Encounter (Signed)
Left message for the patient to call back. DPR on file.

## 2016-10-08 NOTE — Telephone Encounter (Signed)
Patient is advised.  

## 2016-10-08 NOTE — Telephone Encounter (Signed)
-----   Message from Alfredia Ferguson, PA-C sent at 10/07/2016 10:49 AM EDT ----- Please let pt know the MRI shows diffuse fatty changes of liver, several cysts in liver which are benign .and a cyst in spleen which is a bit bigger than 2 years ago-  Radiology reccommends follow up MRI in one year- make sure  Pt has a follow up appt scheduled with primary GI MD ----- Message ----- From: Greggory Keen, LPN Sent: 08/03/1094   9:26 AM To: Alfredia Ferguson, PA-C  Ordering MD was changed. You may not have been sent the report

## 2016-10-10 ENCOUNTER — Encounter: Payer: Self-pay | Admitting: Endocrinology

## 2016-10-10 ENCOUNTER — Ambulatory Visit (INDEPENDENT_AMBULATORY_CARE_PROVIDER_SITE_OTHER): Payer: Medicare Other | Admitting: Endocrinology

## 2016-10-10 VITALS — BP 148/78 | HR 73 | Ht 60.5 in | Wt 180.0 lb

## 2016-10-10 DIAGNOSIS — E1059 Type 1 diabetes mellitus with other circulatory complications: Secondary | ICD-10-CM | POA: Diagnosis not present

## 2016-10-10 MED ORDER — INSULIN LISPRO 100 UNIT/ML (KWIKPEN): PEN_INJECTOR | SUBCUTANEOUS | 11 refills | Status: DC

## 2016-10-10 MED ORDER — BASAGLAR KWIKPEN 100 UNIT/ML ~~LOC~~ SOPN: 20.0000 [IU] | PEN_INJECTOR | Freq: Every day | SUBCUTANEOUS | 11 refills | Status: AC

## 2016-10-10 NOTE — Patient Instructions (Addendum)
check your blood sugar 4 times a day: before the 3 meals, and at bedtime.  also check if you have symptoms of your blood sugar being too high or too low.  please keep a record of the readings and bring it to your next appointment here (or you can bring the meter itself).  You can write it on any piece of paper.  please call us sooner if your blood sugar goes below 70, or if you have a lot of readings over 200.  Please continue the same basaglar, and:  increase humalog to 3 times a day (just before each meal), 15-10-15 units.  Please call us next week, to tell us how the blood sugar is doing.   Please see Vaughan Basta again, to consider a "V-GO" device.   Please come back for a follow-up appointment in 1 month.

## 2016-10-10 NOTE — Progress Notes (Signed)
Subjective:    Patient ID: Jeffrey Holmes, male    DOB: 12-02-1950, 66 y.o.   MRN: [id number]  HPI Pt returns for f/u of diabetes mellitus: DM type: 1 Dx'ed: 8119 Complications: CAD Therapy: insulin since dx GDM: never DKA: only at dx (was mild) Severe hypoglycemia: never Pancreatitis: never Other: she takes multiple daily injections Interval history: He brings a record of his cbg's which I have reviewed today.  It varies from 126-200's.  It is lowest fasting and in the afternoon.   Past Medical History:  Diagnosis Date  . Benign essential HTN 08/08/2015  . CAD (coronary artery disease), native coronary artery 08/28/2015   Severe multivessel ASCAD s/p CABG hybrid with LIMA to LAD and diag and PCI of the RCA.   [name] Chronic kidney disease, stage 3    multiple kidney stones  . Diabetes mellitus without complication ([place])   . H/O hiatal hernia   . History of acute renal failure 2015   secondary to stones  . Hyperlipidemia 08/08/2015    Past Surgical History:  Procedure Laterality Date  . ANGIOPLASTY N/A 08/21/2015   Procedure: ANGIOPLASTY WITH PERCUTANEOUS CORONARY INTERVENTION WITH DES TO RIGHT CORONARY ARTERY;  Surgeon: Jettie Booze, MD;  Location: [place];  Service: Cardiovascular;  Laterality: N/A;  . BACK SURGERY  2006   microdisectomy  . CARDIAC CATHETERIZATION N/A 08/18/2015   Procedure: Left Heart Cath and Coronary Angiography;  Surgeon: Jettie Booze, MD;  Location: [place] [facility];  Service: Cardiovascular;  Laterality: N/A;  . CARDIAC CATHETERIZATION N/A 08/21/2015   Procedure: Coronary Stent Intervention;  Surgeon: Jettie Booze, MD;  Location: [place] [facility];  Service: Cardiovascular;  Laterality: N/A;  . CARDIAC CATHETERIZATION  08/21/2015   Procedure: Bypass Graft Angiography;  Surgeon: Jettie Booze, MD;  Location: [place] [facility];  Service: Cardiovascular;;  . CARDIOVASCULAR STRESS TEST  08/15/2015  . CORONARY ARTERY BYPASS GRAFT N/A  08/21/2015   Procedure: OFF PUMP CORONARY ARTERY BYPASS GRAFTING (CABG), TIMES TWO, USING LEFT INTERNAL MAMMARY ARTERY, RIGHT GREATER SAPHENOUS VEIN HARVESTED ENDOSCOPICALLY;  Surgeon: [name], MD;  Location: [place];  Service: Open Heart Surgery;  Laterality: N/A;  . CYSTOSCOPY WITH RETROGRADE PYELOGRAM, URETEROSCOPY AND STENT PLACEMENT Right 01/06/2014   Procedure: CYSTOSCOPY WITH RETROGRADE PYELOGRAM, URETEROSCOPY AND STENT PLACEMENT;  Surgeon: Bernestine Amass, MD;  Location: [facility];  Service: Urology;  Laterality: Right;  . LITHOTRIPSY     2-3 times in past  . TEE WITHOUT CARDIOVERSION N/A 08/21/2015   Procedure: TRANSESOPHAGEAL ECHOCARDIOGRAM (TEE);  Surgeon: [name], MD;  Location: [place];  Service: Open Heart Surgery;  Laterality: N/A;    Social History   Social History  . Marital status: Married    Spouse name: N/A  . Number of children: N/A  . Years of education: N/A   Occupational History  . Not on file.   Social History Main Topics  . Smoking status: Never Smoker  . Smokeless tobacco: Never Used  . Alcohol use Yes     Comment: occasional every 2-3 months  . Drug use: No  . Sexual activity: Not on file   Other Topics Concern  . Not on file   Social History Narrative  . No narrative on file    Current Outpatient Prescriptions on File Prior to Visit  Medication Sig Dispense Refill  . amLODipine (NORVASC) 5 MG tablet Take 1 tablet (5 mg total) by mouth daily. 90 tablet 3  .  aspirin EC 81 MG tablet Take 1 tablet (81 mg total) by mouth daily.    [name] atorvastatin (LIPITOR) 80 MG tablet Take 1 tablet (80 mg total) by mouth daily. 90 tablet 3  . clopidogrel (PLAVIX) 75 MG tablet Take 1 tablet (75 mg total) by mouth daily. 30 tablet 11  . metFORMIN (GLUCOPHAGE) 850 MG tablet Take 1 tablet (850 mg total) by mouth 2 (two) times daily. 10 tablet 0  . metoprolol succinate (TOPROL-XL) 50 MG 24 hr tablet Take 1 tablet (50 mg total) by mouth 2 (two) times daily. Take  with or immediately following a meal. (Patient taking differently: Take 50 mg by mouth every morning. Take with or immediately following a meal.) 180 tablet 3  . nitroGLYCERIN (NITROSTAT) 0.4 MG SL tablet Place 0.4 mg under the tongue as needed. Place 1 tablet under your tongue every 5 minutes as needed for chest pain for a max of 3 doses    . nystatin (MYCOSTATIN) 100000 UNIT/ML suspension Take 5 mLs by mouth 4 (four) times daily.    . ondansetron (ZOFRAN ODT) 4 MG disintegrating tablet Take 1 tablet (4 mg total) by mouth every 8 (eight) hours as needed for nausea or vomiting. 20 tablet 0  . timolol (TIMOPTIC) 0.5 % ophthalmic solution Place 1 drop into both eyes 2 (two) times daily.     . travoprost, benzalkonium, (TRAVATAN) 0.004 % ophthalmic solution Place 1 drop into both eyes at bedtime.     No current facility-administered medications on file prior to visit.     Allergies  Allergen Reactions  . Vicodin [Hydrocodone-Acetaminophen] Nausea And Vomiting  . Hydrocodone Nausea And Vomiting  . Oxycontin [Oxycodone Hcl] Nausea And Vomiting  . Percocet [Oxycodone-Acetaminophen] Nausea And Vomiting    Family History  Problem Relation Age of Onset  . Kidney failure Mother   . Heart attack Mother     angina   . Diabetes Mother   . Throat cancer Father   . Diabetes Sister     BP (!) 148/78   Pulse 73   Ht 5' 0.5" (1.537 m)   Wt 180 lb (81.6 kg)   SpO2 94%   BMI 34.58 kg/m    Review of Systems He denies hypoglycemia.      Objective:   Physical Exam VITAL SIGNS:  See vs page GENERAL: no distress Pulses: dorsalis pedis intact bilat.   MSK: no deformity of the feet CV: no leg edema Skin:  no ulcer on the feet.  normal color and temp on the feet.  Old healed surgical scar (vein harvest) of the right leg.   Neuro: sensation is intact to touch on the feet Ext: There is bilateral onychomycosis of the toenails.       Assessment & Plan:  Type 1 DM, with CAD: she needs increased  rx.   Patient is advised the following: Patient Instructions  check your blood sugar 4 times a day: before the 3 meals, and at bedtime.  also check if you have symptoms of your blood sugar being too high or too low.  please keep a record of the readings and bring it to your next appointment here (or you can bring the meter itself).  You can write it on any piece of paper.  please call us sooner if your blood sugar goes below 70, or if you have a lot of readings over 200.  Please continue the same basaglar, and:  increase humalog to 3 times a day (just before  each meal), 15-10-15 units.  Please call us next week, to tell us how the blood sugar is doing.   Please see Vaughan Basta again, to consider a "V-GO" device.   Please come back for a follow-up appointment in 1 month.

## 2016-10-11 ENCOUNTER — Telehealth: Payer: Self-pay | Admitting: Cardiology

## 2016-10-11 NOTE — Telephone Encounter (Signed)
Patient states that he is returning a call in regards to his medications. Thanks.

## 2016-10-14 NOTE — Telephone Encounter (Signed)
LMTCB

## 2016-10-14 NOTE — Telephone Encounter (Signed)
Spoke with Patient regarding lab results. H states that he has been off of Repatha since the end of last year as the insurance only approved a few months of therapy. He was unaware we could apply for renewal. Will start renewal process today. Advised he resume the Zetia today. He states his insurance remains the same. Will send for coverage today and call patient when receive approval. Pt states appreciation and understanding.

## 2016-10-15 ENCOUNTER — Ambulatory Visit: Payer: Medicare Other | Admitting: Endocrinology

## 2016-10-16 NOTE — Progress Notes (Signed)
He was shown the V-go, but wanted to try some diet changes and he promised to be more conscious of his portion sizes. His diet is high in fat and most of his meals are very high in carbs (rice).  Discussed the fact that a portion of rice for him should be no more that 1 cup.  He is eating around 3 cups.  Discussed also the need to do brown rice to increase fiber to feel full faster.  Meat portions are also larger than they should be.  He will limit meat portions to 4-5 ounces with supper.   He is not drinking sweet sodas, but will occasionally drink sweet tea.  He has promised to stop this.  He was encouraged to test blood sugars ac and HS and call me in one week.  We discussed why he needs to test a these times, and what he can learn from each reading. He had no final questions.

## 2016-10-17 MED ORDER — EVOLOCUMAB 140 MG/ML ~~LOC~~ SOAJ: 140.0000 mg | SUBCUTANEOUS | 3 refills | Status: DC

## 2016-10-17 NOTE — Telephone Encounter (Signed)
Received notice from [place] [employer] that PA not required for Repatha. Rx sent to [facility]. Pt notified.

## 2016-10-23 ENCOUNTER — Other Ambulatory Visit: Payer: Self-pay

## 2016-10-23 MED ORDER — METFORMIN HCL 850 MG PO TABS: 850.0000 mg | ORAL_TABLET | Freq: Two times a day (BID) | ORAL | 5 refills | Status: DC

## 2016-10-29 NOTE — Patient Instructions (Signed)
Limit rice to 1 cup per meal. Test blood sugars before meals and at bedtime.

## 2016-11-01 ENCOUNTER — Ambulatory Visit (INDEPENDENT_AMBULATORY_CARE_PROVIDER_SITE_OTHER): Payer: Medicare Other | Admitting: Internal Medicine

## 2016-11-01 ENCOUNTER — Encounter: Payer: Self-pay | Admitting: Internal Medicine

## 2016-11-01 ENCOUNTER — Other Ambulatory Visit (INDEPENDENT_AMBULATORY_CARE_PROVIDER_SITE_OTHER): Payer: Medicare Other

## 2016-11-01 VITALS — BP 120/68 | HR 64 | Ht 65.0 in | Wt 180.5 lb

## 2016-11-01 DIAGNOSIS — Z1211 Encounter for screening for malignant neoplasm of colon: Secondary | ICD-10-CM | POA: Diagnosis not present

## 2016-11-01 DIAGNOSIS — K760 Fatty (change of) liver, not elsewhere classified: Secondary | ICD-10-CM

## 2016-11-01 DIAGNOSIS — K7689 Other specified diseases of liver: Secondary | ICD-10-CM | POA: Diagnosis not present

## 2016-11-01 DIAGNOSIS — D734 Cyst of spleen: Secondary | ICD-10-CM

## 2016-11-01 HISTORY — DX: Fatty (change of) liver, not elsewhere classified: K76.0

## 2016-11-01 HISTORY — DX: Other specified diseases of liver: K76.89

## 2016-11-01 HISTORY — DX: Cyst of spleen: D73.4

## 2016-11-01 LAB — HEPATIC FUNCTION PANEL
ALK PHOS: 79 U/L (ref 39–117)
ALT: 19 U/L (ref 0–53)
AST: 16 U/L (ref 0–37)
Albumin: 4 g/dL (ref 3.5–5.2)
BILIRUBIN DIRECT: 0.1 mg/dL (ref 0.0–0.3)
BILIRUBIN TOTAL: 0.6 mg/dL (ref 0.2–1.2)
Total Protein: 6.9 g/dL (ref 6.0–8.3)

## 2016-11-01 NOTE — Patient Instructions (Signed)
We are going to put you in the system for a MRI recall for 09/2017.   Your physician has requested that you go to the basement for the following lab work before leaving today: LFT's   Your provider has ordered Cologuard testing as an option for colon cancer screening. This is performed by [employer] [facility] and may be out of network with your insurance. PRIOR to completing the test, it is YOUR responsibility to contact your insurance about covered benefits for this test. Your out of pocket expense could be anywhere from $0.00 to $649.00. The CPT code to provide to your insurance carrier is [phone].  We have already sent your demographic and insurance information to [employer] [facility] (phone number [phone]) and they should contact you within the next week regarding your test. If you have not heard from them within the next week, please call our office at [phone].   I appreciate the opportunity to care for you. Silvano Rusk, MD, [facility]

## 2016-11-01 NOTE — Progress Notes (Signed)
   KERWIN AUGUSTUS 66 y.o. 05/10/1951 [id number]  Assessment & Plan:   Encounter Diagnoses  Name Primary?  [name] NAFLD (nonalcoholic fatty liver disease) Yes  . Splenic cyst   . Liver cyst   . Colon cancer screening     Will recheck liver function tests today.  He prefers Cologuard for colorectal cancer screening so we will order that. He understands that if it's positive a diagnostic colonoscopy will be necessary.  Repeat MRI to follow-up splenic cyst and liver cysts in about one year from last one.  Further plans for follow-up pending the above. He will continue working on getting his blood glucose under better control and addressing diabetic concerns through primary care and endocrinology.  I appreciate the opportunity to care for this patient. CC: Nanci Pina, FNP    Subjective:   Chief Complaint: Follow-up of hepatomegaly and liver lesions and abnormal transaminases  HPI  The patient is a very nice 66 year old African-American man who was seen in February by Nicoletta Ba PA C due to a history of hepatomegaly and cystic lesions in the liver. Serologic workup was negative for infection autoimmune or inherited diseases of the liver, an MRI was performed that demonstrated simple cyst of the liver hepatomegaly fatty change and also a complex cyst in the spleen which was slightly larger than previous imaging and so follow-up in one year was recommended. Also kidney cysts. He feels well at this time, he is getting his diabetes under better control. He has read about fatty liver and cirrhosis on line. He does not drink alcohol.  Review of Systems As above  Objective:   Physical Exam BP 120/68   Pulse 64   Ht 5\' 5"  (1.651 m)   Wt 180 lb 8 oz (81.9 kg)   BMI 30.04 kg/m    We reviewed the images of the MRI and person.  15 minutes time spent with patient > half in counseling coordination of care

## 2016-11-04 NOTE — Progress Notes (Signed)
My Chart note to patient 

## 2016-11-11 ENCOUNTER — Encounter: Payer: Self-pay | Admitting: Endocrinology

## 2016-11-11 ENCOUNTER — Ambulatory Visit (INDEPENDENT_AMBULATORY_CARE_PROVIDER_SITE_OTHER): Payer: Medicare Other | Admitting: Endocrinology

## 2016-11-11 ENCOUNTER — Encounter: Payer: Medicare Other | Attending: Endocrinology | Admitting: Nutrition

## 2016-11-11 VITALS — BP 148/78 | HR 66 | Wt 181.0 lb

## 2016-11-11 DIAGNOSIS — E1059 Type 1 diabetes mellitus with other circulatory complications: Secondary | ICD-10-CM

## 2016-11-11 DIAGNOSIS — E1022 Type 1 diabetes mellitus with diabetic chronic kidney disease: Secondary | ICD-10-CM

## 2016-11-11 DIAGNOSIS — N183 Chronic kidney disease, stage 3 unspecified: Secondary | ICD-10-CM

## 2016-11-11 DIAGNOSIS — Z794 Long term (current) use of insulin: Secondary | ICD-10-CM

## 2016-11-11 DIAGNOSIS — E119 Type 2 diabetes mellitus without complications: Secondary | ICD-10-CM

## 2016-11-11 LAB — POCT GLYCOSYLATED HEMOGLOBIN (HGB A1C): HEMOGLOBIN A1C: 8.6

## 2016-11-11 NOTE — Patient Instructions (Addendum)
check your blood sugar 4 times a day: before the 3 meals, and at bedtime.  also check if you have symptoms of your blood sugar being too high or too low.  please keep a record of the readings and bring it to your next appointment here (or you can bring the meter itself).  You can write it on any piece of paper.  please call us sooner if your blood sugar goes below 70, or if you have a lot of readings over 200.  Please continue the same insulins. You can stop taking the metformin. blood tests are requested for you today.  We'll let you know about the results. Please come back for a follow-up appointment in 3 months.

## 2016-11-11 NOTE — Progress Notes (Signed)
Subjective:    Patient ID: Jeffrey Holmes, male    DOB: 1950/09/07, 66 y.o.   MRN: [id number]  HPI Pt returns for f/u of diabetes mellitus: DM type: 1 Dx'ed: 0086 Complications: CAD and renal failure Therapy: insulin since dx DKA: only at dx (was mild).  Severe hypoglycemia: never.   Pancreatitis: never.   Other: she takes multiple daily injections.   Interval history: He brings a record of his cbg's which I have reviewed today.  It varies from 69-200.  It is lowest with exercise, usually before lunch.  pt states he feels well in general.  He often skips lunch (and lunch humalog).   Past Medical History:  Diagnosis Date  . Benign essential HTN 08/08/2015  . CAD (coronary artery disease), native coronary artery 08/28/2015   Severe multivessel ASCAD s/p CABG hybrid with LIMA to LAD and diag and PCI of the RCA.   [name] Chronic kidney disease, stage 3    multiple kidney stones  . Diabetes mellitus without complication ([place])   . H/O hiatal hernia   . Hepatic cyst 11/01/2016  . History of acute renal failure 2015   secondary to stones  . Hyperlipidemia 08/08/2015  . NAFLD (nonalcoholic fatty liver disease) 11/01/2016  . Splenic cyst 11/01/2016  . Ureteral calculus 01/06/2014    Past Surgical History:  Procedure Laterality Date  . ANGIOPLASTY N/A 08/21/2015   Procedure: ANGIOPLASTY WITH PERCUTANEOUS CORONARY INTERVENTION WITH DES TO RIGHT CORONARY ARTERY;  Surgeon: Jettie Booze, MD;  Location: [place];  Service: Cardiovascular;  Laterality: N/A;  . BACK SURGERY  2006   microdisectomy  . CARDIAC CATHETERIZATION N/A 08/18/2015   Procedure: Left Heart Cath and Coronary Angiography;  Surgeon: Jettie Booze, MD;  Location: [place] [facility];  Service: Cardiovascular;  Laterality: N/A;  . CARDIAC CATHETERIZATION N/A 08/21/2015   Procedure: Coronary Stent Intervention;  Surgeon: Jettie Booze, MD;  Location: [place] [facility];  Service: Cardiovascular;  Laterality: N/A;  . CARDIAC  CATHETERIZATION  08/21/2015   Procedure: Bypass Graft Angiography;  Surgeon: Jettie Booze, MD;  Location: [place] [facility];  Service: Cardiovascular;;  . CARDIOVASCULAR STRESS TEST  08/15/2015  . CORONARY ARTERY BYPASS GRAFT N/A 08/21/2015   Procedure: OFF PUMP CORONARY ARTERY BYPASS GRAFTING (CABG), TIMES TWO, USING LEFT INTERNAL MAMMARY ARTERY, RIGHT GREATER SAPHENOUS VEIN HARVESTED ENDOSCOPICALLY;  Surgeon: [name], MD;  Location: [place];  Service: Open Heart Surgery;  Laterality: N/A;  . CYSTOSCOPY WITH RETROGRADE PYELOGRAM, URETEROSCOPY AND STENT PLACEMENT Right 01/06/2014   Procedure: CYSTOSCOPY WITH RETROGRADE PYELOGRAM, URETEROSCOPY AND STENT PLACEMENT;  Surgeon: Bernestine Amass, MD;  [facility];  Service: Urology;  Laterality: Right;  . LITHOTRIPSY     2-3 times in past  . TEE WITHOUT CARDIOVERSION N/A 08/21/2015   Procedure: TRANSESOPHAGEAL ECHOCARDIOGRAM (TEE);  Surgeon: [name], MD;  Location: [place];  Service: Open Heart Surgery;  Laterality: N/A;    Social History   Social History  . Marital status: Married    Spouse name: N/A  . Number of children: 1  . Years of education: N/A   Occupational History  . retired    Social History Main Topics  . Smoking status: Never Smoker  . Smokeless tobacco: Never Used  . Alcohol use Yes     Comment: occasional every 2-3 months  . Drug use: No  . Sexual activity: Not on file   Other Topics Concern  . Not on file  Social History Narrative  . No narrative on file    Current Outpatient Prescriptions on File Prior to Visit  Medication Sig Dispense Refill  . amLODipine (NORVASC) 5 MG tablet Take 1 tablet (5 mg total) by mouth daily. 90 tablet 3  . aspirin EC 81 MG tablet Take 1 tablet (81 mg total) by mouth daily.    [name] atorvastatin (LIPITOR) 80 MG tablet Take 1 tablet (80 mg total) by mouth daily. 90 tablet 3  . clopidogrel (PLAVIX) 75 MG tablet Take 1 tablet (75 mg total) by mouth daily. 30 tablet  11  . Evolocumab (REPATHA SURECLICK) 321 MG/ML SOAJ Inject 140 mg into the skin every 14 (fourteen) days. 6 pen 3  . Insulin Glargine (BASAGLAR KWIKPEN) 100 UNIT/ML SOPN Inject 0.2 mLs (20 Units total) into the skin at bedtime. 5 pen 11  . insulin lispro (HUMALOG KWIKPEN) 100 UNIT/ML KiwkPen 3 times a day (just before each meal) 15-10-15, and pen needles 4/day 15 mL 11  . metoprolol succinate (TOPROL-XL) 50 MG 24 hr tablet Take 1 tablet (50 mg total) by mouth 2 (two) times daily. Take with or immediately following a meal. (Patient taking differently: Take 50 mg by mouth every morning. Take with or immediately following a meal.) 180 tablet 3  . montelukast (SINGULAIR) 10 MG tablet daily.    . nitroGLYCERIN (NITROSTAT) 0.4 MG SL tablet Place 0.4 mg under the tongue as needed. Place 1 tablet under your tongue every 5 minutes as needed for chest pain for a max of 3 doses    . timolol (TIMOPTIC) 0.5 % ophthalmic solution Place 1 drop into both eyes 2 (two) times daily.     . travoprost, benzalkonium, (TRAVATAN) 0.004 % ophthalmic solution Place 1 drop into both eyes at bedtime.     No current facility-administered medications on file prior to visit.     Allergies  Allergen Reactions  . Vicodin [Hydrocodone-Acetaminophen] Nausea And Vomiting  . Hydrocodone Nausea And Vomiting  . Oxycontin [Oxycodone Hcl] Nausea And Vomiting  . Percocet [Oxycodone-Acetaminophen] Nausea And Vomiting    Family History  Problem Relation Age of Onset  . Kidney failure Mother   . Heart attack Mother     angina   . Diabetes Mother   . Throat cancer Father   . Diabetes Sister     BP (!) 148/78 (BP Location: Left Arm, Patient Position: Sitting, Cuff Size: Normal)   Pulse 66   Wt 181 lb (82.1 kg)   SpO2 95%   BMI 30.12 kg/m    Review of Systems Denies LOC.      Objective:   Physical Exam VITAL SIGNS:  See vs page GENERAL: no distress Pulses: dorsalis pedis intact bilat.   MSK: no deformity of the  feet CV: trace bilat leg edema Skin:  no ulcer on the feet.  normal color and temp on the feet.  Old healed surgical scar (vein harvest) of the right leg.   Neuro: sensation is intact to touch on the feet.   Ext: There is bilateral onychomycosis of the toenails.    a1c=8.9%    Assessment & Plan:  Type 1 DM: worse.  cbg's are lower than a1c would suggest.  Check fructosamine.  Renal failure: he needs to d/c metformin.  Patient Instructions  check your blood sugar 4 times a day: before the 3 meals, and at bedtime.  also check if you have symptoms of your blood sugar being too high or too low.  please keep  a record of the readings and bring it to your next appointment here (or you can bring the meter itself).  You can write it on any piece of paper.  please call us sooner if your blood sugar goes below 70, or if you have a lot of readings over 200.  Please continue the same insulins. You can stop taking the metformin. blood tests are requested for you today.  We'll let you know about the results. Please come back for a follow-up appointment in 3 months.

## 2016-11-12 ENCOUNTER — Encounter: Payer: Self-pay | Admitting: Cardiology

## 2016-11-12 DIAGNOSIS — Z1211 Encounter for screening for malignant neoplasm of colon: Secondary | ICD-10-CM | POA: Diagnosis not present

## 2016-11-12 DIAGNOSIS — Z1212 Encounter for screening for malignant neoplasm of rectum: Secondary | ICD-10-CM | POA: Diagnosis not present

## 2016-11-12 NOTE — Progress Notes (Signed)
Blood sugars are much better due to dietary changes.  He has seen Dr. Loanne Drilling today, but is not certain if he will every come of the insulin, like he is hoping.  He was shown the V-go, and we discussed the advantages and disadvantages of this type of therapy.  He is only taking 20u of [place], and eating two meals and one snack per day.  We discussed the wasting of insulin (approximately 25u/day), and he is not sure if he wants to do this.  He will wait until Dr. Loanne Drilling notifies him of his lab work he had done today, and let me know what he wants to do. Brochure given on the V-go with telephone number to call if more questions.

## 2016-11-12 NOTE — Patient Instructions (Signed)
Call if questions, and to let me know if you are interested in pursuing the V-go

## 2016-11-13 DIAGNOSIS — H401131 Primary open-angle glaucoma, bilateral, mild stage: Secondary | ICD-10-CM | POA: Diagnosis not present

## 2016-11-13 LAB — FRUCTOSAMINE: Fructosamine: 229 umol/L (ref 190–270)

## 2016-11-20 ENCOUNTER — Other Ambulatory Visit: Payer: Self-pay

## 2016-11-20 LAB — COLOGUARD: COLOGUARD: POSITIVE

## 2016-11-21 NOTE — Progress Notes (Signed)
His cologuard is + so colonoscopy is next step. He was aware of this when we did the test. Please schedule

## 2016-11-26 ENCOUNTER — Telehealth: Payer: Self-pay | Admitting: Internal Medicine

## 2016-11-26 NOTE — Telephone Encounter (Signed)
See results note for additional details 

## 2016-12-01 NOTE — Progress Notes (Signed)
Cardiology Office Note    Date:  12/02/2016   ID:  Jeffrey Holmes, DOB 02-19-1951, MRN [id number]  PCP:  Nanci Pina, FNP  Cardiologist:  Fransico Him, MD   Chief Complaint  Patient presents with  . Coronary Artery Disease  . Hypertension  . Hyperlipidemia    History of Present Illness:  Jeffrey Holmes is a 66 y.o. male with a history of severe multivessel CAD s/p hybrid procedure with CABG with LIMA to LAD/Diag and PCI of the RCA. He also has a history of HTN, dyslipidemia and CKD stage 3. He is here for followup and is doing well today. He denies any anginal chest pain, SOB, DOE, PND, orthopnea, LE edema, palpitations or syncope. He continues to walk 4 miles daily without any problems. He occasionally has some mild dizziness when his BS gets too low.    Past Medical History:  Diagnosis Date  . Benign essential HTN 08/08/2015  . CAD (coronary artery disease), native coronary artery 08/28/2015   Severe multivessel ASCAD s/p CABG hybrid with LIMA to LAD and diag and PCI of the RCA.   [name] Chronic kidney disease, stage 3    multiple kidney stones  . Diabetes mellitus without complication ([place])   . H/O hiatal hernia   . Hepatic cyst 11/01/2016  . History of acute renal failure 2015   secondary to stones  . Hyperlipidemia 08/08/2015  . NAFLD (nonalcoholic fatty liver disease) 11/01/2016  . Splenic cyst 11/01/2016  . Ureteral calculus 01/06/2014    Past Surgical History:  Procedure Laterality Date  . ANGIOPLASTY N/A 08/21/2015   Procedure: ANGIOPLASTY WITH PERCUTANEOUS CORONARY INTERVENTION WITH DES TO RIGHT CORONARY ARTERY;  Surgeon: Jettie Booze, MD;  Location: [facility];  Service: Cardiovascular;  Laterality: N/A;  . BACK SURGERY  2006   microdisectomy  . CARDIAC CATHETERIZATION N/A 08/18/2015   Procedure: Left Heart Cath and Coronary Angiography;  Surgeon: Jettie Booze, MD;  Location: [facility];  Service: Cardiovascular;  Laterality: N/A;  . CARDIAC CATHETERIZATION  N/A 08/21/2015   Procedure: Coronary Stent Intervention;  Surgeon: Jettie Booze, MD;  Location: [facility];  Service: Cardiovascular;  Laterality: N/A;  . CARDIAC CATHETERIZATION  08/21/2015   Procedure: Bypass Graft Angiography;  Surgeon: Jettie Booze, MD;  Location: [facility];  Service: Cardiovascular;;  . CARDIOVASCULAR STRESS TEST  08/15/2015  . CORONARY ARTERY BYPASS GRAFT N/A 08/21/2015   Procedure: OFF PUMP CORONARY ARTERY BYPASS GRAFTING (CABG), TIMES TWO, USING LEFT INTERNAL MAMMARY ARTERY, RIGHT GREATER SAPHENOUS VEIN HARVESTED ENDOSCOPICALLY;  Surgeon: [name], MD;  Location: [facility];  Service: Open Heart Surgery;  Laterality: N/A;  . CYSTOSCOPY WITH RETROGRADE PYELOGRAM, URETEROSCOPY AND STENT PLACEMENT Right 01/06/2014   Procedure: CYSTOSCOPY WITH RETROGRADE PYELOGRAM, URETEROSCOPY AND STENT PLACEMENT;  Surgeon: Bernestine Amass, MD;  Location[facility];  Service: Urology;  Laterality: Right;  . LITHOTRIPSY     2-3 times in past  . TEE WITHOUT CARDIOVERSION N/A 08/21/2015   Procedure: TRANSESOPHAGEAL ECHOCARDIOGRAM (TEE);  Surgeon: [name], MD;  Location: [facility];  Service: Open Heart Surgery;  Laterality: N/A;    Current Medications: Current Meds  Medication Sig  . amLODipine (NORVASC) 5 MG tablet Take 1 tablet (5 mg total) by mouth daily.  [name] aspirin EC 81 MG tablet Take 1 tablet (81 mg total) by mouth daily.  [name] atorvastatin (LIPITOR) 80 MG tablet Take 1 tablet (80 mg total) by mouth  daily.  . clopidogrel (PLAVIX) 75 MG tablet Take 1 tablet (75 mg total) by mouth daily.  . Insulin Glargine (BASAGLAR KWIKPEN) 100 UNIT/ML SOPN Inject 0.2 mLs (20 Units total) into the skin at bedtime.  . insulin lispro (HUMALOG KWIKPEN) 100 UNIT/ML KiwkPen 3 times a day (just before each meal) 15-10-15, and pen needles 4/day  . metoprolol succinate (TOPROL-XL) 50 MG 24 hr tablet Take 1 tablet (50 mg total) by mouth 2 (two) times daily. Take with or immediately  following a meal.  . montelukast (SINGULAIR) 10 MG tablet daily.  . nitroGLYCERIN (NITROSTAT) 0.4 MG SL tablet Place 0.4 mg under the tongue as needed. Place 1 tablet under your tongue every 5 minutes as needed for chest pain for a max of 3 doses  . timolol (TIMOPTIC) 0.5 % ophthalmic solution Place 1 drop into both eyes 2 (two) times daily.   . travoprost, benzalkonium, (TRAVATAN) 0.004 % ophthalmic solution Place 1 drop into both eyes at bedtime.    Allergies:   Vicodin [hydrocodone-acetaminophen]; Hydrocodone; Oxycontin [oxycodone hcl]; and Percocet [oxycodone-acetaminophen]   Social History   Social History  . Marital status: Married    Spouse name: N/A  . Number of children: 1  . Years of education: N/A   Occupational History  . retired    Social History Main Topics  . Smoking status: Never Smoker  . Smokeless tobacco: Never Used  . Alcohol use Yes     Comment: occasional every 2-3 months  . Drug use: No  . Sexual activity: Not Asked   Other Topics Concern  . None   Social History Narrative  . None     Family History:  The patient's family history includes Diabetes in his mother and sister; Heart attack in his mother; Kidney failure in his mother; Throat cancer in his father.   ROS:   Please see the history of present illness.    ROS All other systems reviewed and are negative.  No flowsheet data found.     PHYSICAL EXAM:   VS:  Ht 5\' 5"  (1.651 m)   Wt 182 lb 12.8 oz (82.9 kg)   SpO2 98%   BMI 30.42 kg/m    GEN: Well nourished, well developed, in no acute distress  HEENT: normal  Neck: no JVD, carotid bruits, or masses Cardiac: RRR; no murmurs, rubs, or gallops,no edema.  Intact distal pulses bilaterally.  Respiratory:  clear to auscultation bilaterally, normal work of breathing GI: soft, nontender, nondistended, + BS MS: no deformity or atrophy  Skin: warm and dry, no rash Neuro:  Alert and Oriented x 3, Strength and sensation are intact Psych:  euthymic mood, full affect  Wt Readings from Last 3 Encounters:  12/02/16 182 lb 12.8 oz (82.9 kg)  11/11/16 181 lb (82.1 kg)  11/01/16 180 lb 8 oz (81.9 kg)      Studies/Labs Reviewed:   EKG:  EKG is not ordered today.    Recent Labs: 09/12/2016: Hemoglobin 16.7; Platelets 184 09/19/2016: BUN 18; Creatinine, Ser 1.66; Potassium 3.8; Sodium 143 11/01/2016: ALT 19   Lipid Panel    Component Value Date/Time   CHOL 199 09/26/2016 0758   TRIG 235 (H) 09/26/2016 0758   HDL 34 (L) 09/26/2016 0758   CHOLHDL 5.9 (H) 09/26/2016 0758   CHOLHDL 1.6 07/08/2016 0855   VLDL 37 (H) 07/08/2016 0855   LDLCALC 118 (H) 09/26/2016 0758   LDLDIRECT 13 07/08/2016 0855    Additional studies/ records that were reviewed today  include:  none    ASSESSMENT:    1. Coronary artery disease involving native coronary artery of native heart without angina pectoris   2. Benign essential HTN   3. Pure hypercholesterolemia      PLAN:  In order of problems listed above:  1. ASCAD s/p hybrid procedure with CABG with LIMA ato LAD/Diag and PCI of RCA.  He has not had any anginal symptoms.  He is tolerating his med and is compliant.  He will continue on DAPT with ASA and Plavix as well as statin and B. 2. HTN - BP well controlled on current meds.  He will continue on amlodipine and Toprol. 3. Hyperlipidemia with LDL goal < 70.  He will continue on statin.  I will check on the status of him getting Repatha.     Medication Adjustments/Labs and Tests Ordered: Current medicines are reviewed at length with the patient today.  Concerns regarding medicines are outlined above.  Medication changes, Labs and Tests ordered today are listed in the Patient Instructions below.  There are no Patient Instructions on file for this visit.   Signed, Fransico Him, MD  12/02/2016 7:52 AM    [facility] [address], [place], [place]  [address] Phone: [phone]; Fax: [phone]

## 2016-12-02 ENCOUNTER — Ambulatory Visit (INDEPENDENT_AMBULATORY_CARE_PROVIDER_SITE_OTHER): Payer: Medicare Other | Admitting: Cardiology

## 2016-12-02 ENCOUNTER — Other Ambulatory Visit: Payer: Self-pay | Admitting: Pharmacist

## 2016-12-02 ENCOUNTER — Encounter: Payer: Self-pay | Admitting: Cardiology

## 2016-12-02 VITALS — Ht 65.0 in | Wt 182.8 lb

## 2016-12-02 DIAGNOSIS — I10 Essential (primary) hypertension: Secondary | ICD-10-CM | POA: Diagnosis not present

## 2016-12-02 DIAGNOSIS — I2510 Atherosclerotic heart disease of native coronary artery without angina pectoris: Secondary | ICD-10-CM

## 2016-12-02 DIAGNOSIS — E7800 Pure hypercholesterolemia, unspecified: Secondary | ICD-10-CM

## 2016-12-02 MED ORDER — EVOLOCUMAB 140 MG/ML ~~LOC~~ SOAJ: 1.0000 "pen " | SUBCUTANEOUS | 11 refills | Status: DC

## 2016-12-02 NOTE — Patient Instructions (Addendum)
Medication Instructions:  Your Repatha was approved. Please call the [facility] at [phone] to fill. If you have any problems, please call our office.  Labwork: None  Testing/Procedures: None  Follow-Up: Your physician wants you to follow-up in: 1 year with Dr. Radford Pax. You will receive a reminder letter in the mail two months in advance. If you don't receive a letter, please call our office to schedule the follow-up appointment.   Any Other Special Instructions Will Be Listed Below (If Applicable).     If you need a refill on your cardiac medications before your next appointment, please call your pharmacy.

## 2016-12-09 ENCOUNTER — Telehealth: Payer: Self-pay | Admitting: Cardiology

## 2016-12-09 MED ORDER — EVOLOCUMAB 140 MG/ML ~~LOC~~ SOAJ: 1.0000 "pen " | SUBCUTANEOUS | 11 refills | Status: DC

## 2016-12-09 NOTE — Telephone Encounter (Signed)
New message      Talk to the pharmacist regarding his repatha

## 2016-12-09 NOTE — Telephone Encounter (Signed)
Pt states that [facility] will no longer fill his Repatha because they don't have their contract with [employer] any longer. He would like his rx sent to [facility]. Advised him that they will need to order the [place] since it's a specialty medication. He is aware and will call with any further concerns.

## 2016-12-18 ENCOUNTER — Other Ambulatory Visit: Payer: Self-pay | Admitting: Cardiology

## 2016-12-19 ENCOUNTER — Telehealth: Payer: Self-pay | Admitting: *Deleted

## 2016-12-19 NOTE — Telephone Encounter (Signed)
Dr. Radford Pax,   Patient has been scheduled for a colonoscopy with Dr. Silvano Rusk for 01/23/17 for a positive cologuard .  May he come off his Plavix for 5 days before the exam?  Thank you,  Barb Merino, RN

## 2016-12-19 NOTE — Telephone Encounter (Signed)
Dr. Carlean Purl patient is on Plavix.  How many days do you want him to come off for colonoscopy? 5 or 7?

## 2016-12-19 NOTE — Telephone Encounter (Signed)
Sheri,  This gentleman is scheduled with Dr Carlean Purl for a colon for a positive cologuard-  He saw Dr Carlean Purl in the office 11-01-16- he is on plavix and I do not see a note from the prescribing MD about holding this plavix. Can you send a note please to get a hold time.  Please and thank you so much, Lelan Pons PV

## 2016-12-19 NOTE — Telephone Encounter (Signed)
Ok to hold Plavix and ASA for colonoscopy

## 2016-12-19 NOTE — Telephone Encounter (Signed)
5 days off clopidogrel

## 2016-12-20 NOTE — Telephone Encounter (Signed)
Thank you. Will hold 5 days. Jeffrey Holmes PV

## 2016-12-24 ENCOUNTER — Telehealth: Payer: Self-pay | Admitting: Pharmacist

## 2016-12-24 MED ORDER — EVOLOCUMAB 140 MG/ML ~~LOC~~ SOAJ
1.0000 | SUBCUTANEOUS | 11 refills | Status: DC
Start: 2016-12-24 — End: 2018-01-21

## 2016-12-24 NOTE — Telephone Encounter (Signed)
Pt states that [facility] is his preferred pharmacy now with his insurance per a letter he received. He also paid out of pocket > $300 for Repatha at his local [facility] because they did not apply his copay card. Walked pt through the copay card registration over the phone and he will call [facility] so that they can apply his copay card. Advised him to also ask about reimbursement for his last payment. He will call with any further questions.

## 2016-12-24 NOTE — Telephone Encounter (Signed)
New message      Talk to the pharmacist regarding the repatha presc

## 2017-01-02 ENCOUNTER — Ambulatory Visit: Payer: Medicare Other | Admitting: *Deleted

## 2017-01-02 VITALS — Ht 65.0 in | Wt 186.8 lb

## 2017-01-02 DIAGNOSIS — R195 Other fecal abnormalities: Secondary | ICD-10-CM

## 2017-01-02 NOTE — Progress Notes (Signed)
Patient denies any allergies to egg or soy products. Patient denies complications with anesthesia/sedation.  Patient denies oxygen use at home and denies diet medications. Pamphlet for colonoscopy given.

## 2017-01-10 DIAGNOSIS — K219 Gastro-esophageal reflux disease without esophagitis: Secondary | ICD-10-CM | POA: Diagnosis not present

## 2017-01-10 DIAGNOSIS — R05 Cough: Secondary | ICD-10-CM | POA: Diagnosis not present

## 2017-01-10 DIAGNOSIS — J3089 Other allergic rhinitis: Secondary | ICD-10-CM | POA: Diagnosis not present

## 2017-01-20 DIAGNOSIS — N183 Chronic kidney disease, stage 3 (moderate): Secondary | ICD-10-CM | POA: Diagnosis not present

## 2017-01-20 DIAGNOSIS — E1365 Other specified diabetes mellitus with hyperglycemia: Secondary | ICD-10-CM | POA: Diagnosis not present

## 2017-01-20 DIAGNOSIS — I129 Hypertensive chronic kidney disease with stage 1 through stage 4 chronic kidney disease, or unspecified chronic kidney disease: Secondary | ICD-10-CM | POA: Diagnosis not present

## 2017-01-20 DIAGNOSIS — E782 Mixed hyperlipidemia: Secondary | ICD-10-CM | POA: Diagnosis not present

## 2017-01-20 DIAGNOSIS — R112 Nausea with vomiting, unspecified: Secondary | ICD-10-CM | POA: Diagnosis not present

## 2017-01-20 DIAGNOSIS — Z79899 Other long term (current) drug therapy: Secondary | ICD-10-CM | POA: Diagnosis not present

## 2017-01-21 ENCOUNTER — Telehealth: Payer: Self-pay | Admitting: Internal Medicine

## 2017-01-21 NOTE — Telephone Encounter (Signed)
OK no charge ?

## 2017-01-23 ENCOUNTER — Encounter: Payer: Medicare Other | Admitting: Internal Medicine

## 2017-01-27 ENCOUNTER — Other Ambulatory Visit: Payer: Self-pay | Admitting: Cardiology

## 2017-02-10 ENCOUNTER — Ambulatory Visit: Payer: Medicare Other | Admitting: Endocrinology

## 2017-02-23 ENCOUNTER — Other Ambulatory Visit: Payer: Self-pay | Admitting: Cardiology

## 2017-03-07 IMAGING — CR DG CHEST 1V PORT
1 series · 1 of 1 positions shown · non-contrast
Comparison: 08/21/2015.

CLINICAL DATA: Status post CABG.

EXAM:
PORTABLE CHEST 1 VIEW

[AP]
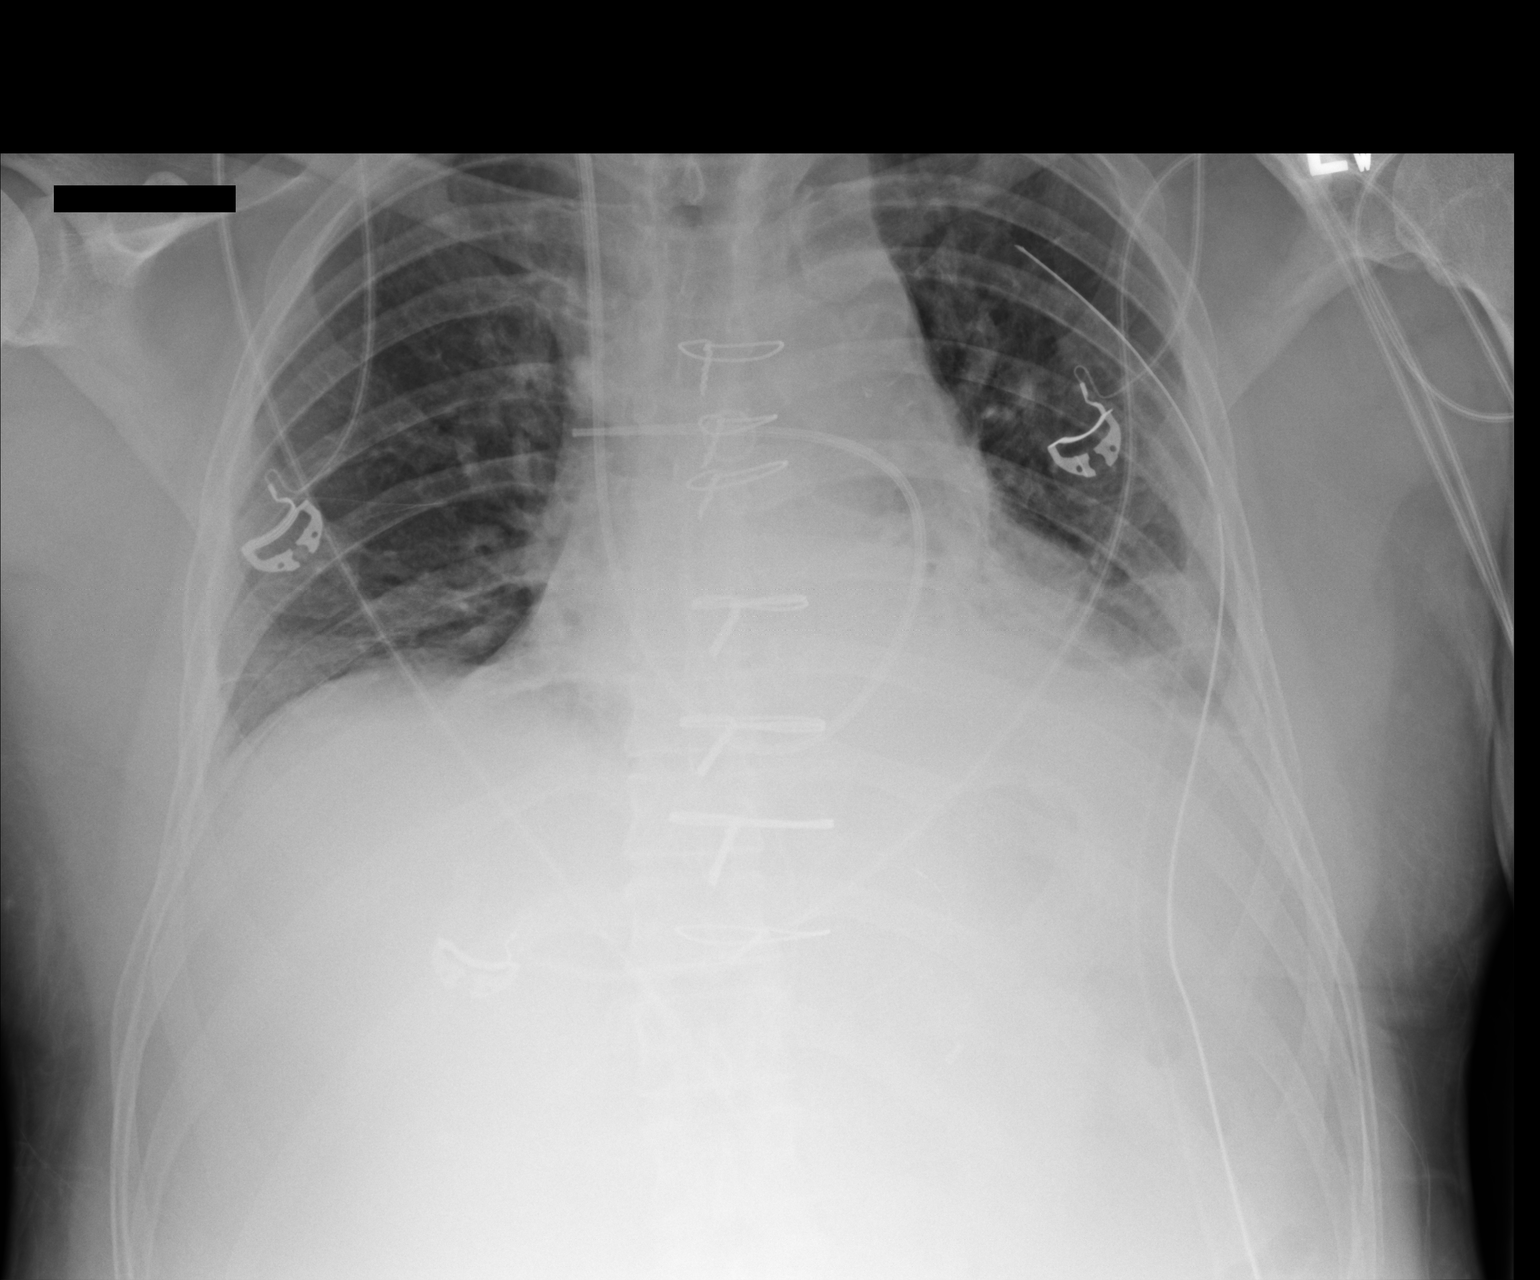

[1 of 1 positions shown; findings below may reference images not displayed]

FINDINGS: The endotracheal tube has been removed. The right jugular Swan-Ganz
catheter tip in the proximal intersegmental pulmonary artery. The
nasogastric tube has been removed. A left chest tube remains in
place with no pneumothorax seen. Mildly increased bibasilar
atelectasis. Borderline enlarged cardiac silhouette.
IMPRESSION: Mildly increased bibasilar atelectasis.

## 2017-03-11 DIAGNOSIS — N202 Calculus of kidney with calculus of ureter: Secondary | ICD-10-CM | POA: Diagnosis not present

## 2017-03-11 DIAGNOSIS — N132 Hydronephrosis with renal and ureteral calculous obstruction: Secondary | ICD-10-CM | POA: Diagnosis not present

## 2017-03-11 DIAGNOSIS — N201 Calculus of ureter: Secondary | ICD-10-CM | POA: Diagnosis not present

## 2017-03-11 DIAGNOSIS — R319 Hematuria, unspecified: Secondary | ICD-10-CM | POA: Diagnosis not present

## 2017-03-12 ENCOUNTER — Emergency Department (HOSPITAL_COMMUNITY): Payer: Medicare Other | Admitting: Certified Registered Nurse Anesthetist

## 2017-03-12 ENCOUNTER — Encounter (HOSPITAL_COMMUNITY): Payer: Self-pay | Admitting: Emergency Medicine

## 2017-03-12 ENCOUNTER — Emergency Department (HOSPITAL_COMMUNITY): Payer: Medicare Other

## 2017-03-12 ENCOUNTER — Observation Stay (HOSPITAL_COMMUNITY)
Admission: EM | Admit: 2017-03-12 | Discharge: 2017-03-13 | Disposition: A | Discharge status: Home / Self Care | Payer: Medicare Other | Attending: Urology | Admitting: Urology

## 2017-03-12 ENCOUNTER — Encounter (HOSPITAL_COMMUNITY)
Admission: EM | Disposition: A | Discharge status: Home / Self Care | Payer: Self-pay | Source: Home / Self Care | Attending: Emergency Medicine

## 2017-03-12 DIAGNOSIS — Z955 Presence of coronary angioplasty implant and graft: Secondary | ICD-10-CM | POA: Diagnosis not present

## 2017-03-12 DIAGNOSIS — E785 Hyperlipidemia, unspecified: Secondary | ICD-10-CM | POA: Diagnosis not present

## 2017-03-12 DIAGNOSIS — N201 Calculus of ureter: Secondary | ICD-10-CM | POA: Diagnosis not present

## 2017-03-12 DIAGNOSIS — Z7982 Long term (current) use of aspirin: Secondary | ICD-10-CM | POA: Diagnosis not present

## 2017-03-12 DIAGNOSIS — Z794 Long term (current) use of insulin: Secondary | ICD-10-CM | POA: Insufficient documentation

## 2017-03-12 DIAGNOSIS — I129 Hypertensive chronic kidney disease with stage 1 through stage 4 chronic kidney disease, or unspecified chronic kidney disease: Secondary | ICD-10-CM | POA: Insufficient documentation

## 2017-03-12 DIAGNOSIS — G8929 Other chronic pain: Secondary | ICD-10-CM | POA: Diagnosis not present

## 2017-03-12 DIAGNOSIS — K449 Diaphragmatic hernia without obstruction or gangrene: Secondary | ICD-10-CM | POA: Insufficient documentation

## 2017-03-12 DIAGNOSIS — Z87442 Personal history of urinary calculi: Secondary | ICD-10-CM | POA: Diagnosis not present

## 2017-03-12 DIAGNOSIS — E1122 Type 2 diabetes mellitus with diabetic chronic kidney disease: Secondary | ICD-10-CM | POA: Diagnosis not present

## 2017-03-12 DIAGNOSIS — N183 Chronic kidney disease, stage 3 (moderate): Secondary | ICD-10-CM | POA: Insufficient documentation

## 2017-03-12 DIAGNOSIS — N179 Acute kidney failure, unspecified: Secondary | ICD-10-CM | POA: Insufficient documentation

## 2017-03-12 DIAGNOSIS — I2510 Atherosclerotic heart disease of native coronary artery without angina pectoris: Secondary | ICD-10-CM | POA: Insufficient documentation

## 2017-03-12 DIAGNOSIS — Z79899 Other long term (current) drug therapy: Secondary | ICD-10-CM | POA: Insufficient documentation

## 2017-03-12 DIAGNOSIS — I25810 Atherosclerosis of coronary artery bypass graft(s) without angina pectoris: Secondary | ICD-10-CM | POA: Diagnosis not present

## 2017-03-12 DIAGNOSIS — Z951 Presence of aortocoronary bypass graft: Secondary | ICD-10-CM | POA: Diagnosis not present

## 2017-03-12 DIAGNOSIS — K760 Fatty (change of) liver, not elsewhere classified: Secondary | ICD-10-CM | POA: Diagnosis not present

## 2017-03-12 DIAGNOSIS — Z885 Allergy status to narcotic agent status: Secondary | ICD-10-CM | POA: Insufficient documentation

## 2017-03-12 DIAGNOSIS — R1031 Right lower quadrant pain: Secondary | ICD-10-CM | POA: Diagnosis not present

## 2017-03-12 DIAGNOSIS — R112 Nausea with vomiting, unspecified: Secondary | ICD-10-CM | POA: Diagnosis not present

## 2017-03-12 HISTORY — PX: CYSTOSCOPY W/ URETERAL STENT PLACEMENT: SHX1429

## 2017-03-12 LAB — COMPREHENSIVE METABOLIC PANEL
ALK PHOS: 71 U/L (ref 38–126)
ALT: 14 U/L — ABNORMAL LOW (ref 17–63)
ANION GAP: 14 (ref 5–15)
AST: 12 U/L — ABNORMAL LOW (ref 15–41)
Albumin: 4.4 g/dL (ref 3.5–5.0)
BUN: 78 mg/dL — ABNORMAL HIGH (ref 6–20)
CALCIUM: 9 mg/dL (ref 8.9–10.3)
CO2: 19 mmol/L — AB (ref 22–32)
Chloride: 111 mmol/L (ref 101–111)
Creatinine, Ser: 9.14 mg/dL — ABNORMAL HIGH (ref 0.61–1.24)
GFR calc non Af Amer: 5 mL/min — ABNORMAL LOW (ref >60)
GFR, EST AFRICAN AMERICAN: 6 mL/min — AB (ref >60)
Glucose, Bld: 92 mg/dL (ref 65–99)
Potassium: 4.9 mmol/L (ref 3.5–5.1)
SODIUM: 144 mmol/L (ref 135–145)
TOTAL PROTEIN: 8.1 g/dL (ref 6.5–8.1)
Total Bilirubin: 0.8 mg/dL (ref 0.3–1.2)

## 2017-03-12 LAB — URINALYSIS, ROUTINE W REFLEX MICROSCOPIC
Bilirubin Urine: NEGATIVE
GLUCOSE, UA: NEGATIVE mg/dL
KETONES UR: NEGATIVE mg/dL
Leukocytes, UA: NEGATIVE
NITRITE: NEGATIVE
Specific Gravity, Urine: 1.013 (ref 1.005–1.030)
Squamous Epithelial / LPF: NONE SEEN
pH: 5 (ref 5.0–8.0)

## 2017-03-12 LAB — GLUCOSE, CAPILLARY
Glucose-Capillary: 81 mg/dL (ref 65–99)
Glucose-Capillary: 121 mg/dL — ABNORMAL HIGH (ref 65–99)
Glucose-Capillary: 133 mg/dL — ABNORMAL HIGH (ref 65–99)

## 2017-03-12 LAB — LIPASE, BLOOD: Lipase: 96 U/L — ABNORMAL HIGH (ref 11–51)

## 2017-03-12 LAB — CBC
HCT: 46.3 % (ref 39.0–52.0)
HEMOGLOBIN: 16.2 g/dL (ref 13.0–17.0)
MCH: 30.9 pg (ref 26.0–34.0)
MCHC: 35 g/dL (ref 30.0–36.0)
MCV: 88.4 fL (ref 78.0–100.0)
Platelets: 239 10*3/uL (ref 150–400)
RBC: 5.24 MIL/uL (ref 4.22–5.81)
RDW: 12.9 % (ref 11.5–15.5)
WBC: 11.3 10*3/uL — ABNORMAL HIGH (ref 4.0–10.5)

## 2017-03-12 SURGERY — CYSTOSCOPY, WITH RETROGRADE PYELOGRAM AND URETERAL STENT INSERTION
Anesthesia: General | Site: Ureter | Laterality: Right

## 2017-03-12 MED ORDER — FENTANYL CITRATE (PF) 100 MCG/2ML IJ SOLN: 25.0000 ug | INTRAMUSCULAR | Status: DC | PRN

## 2017-03-12 MED ORDER — CIPROFLOXACIN IN D5W 400 MG/200ML IV SOLN
INTRAVENOUS | Status: DC | PRN
  Administered 2017-03-12: 400 mg via INTRAVENOUS

## 2017-03-12 MED ORDER — SODIUM CHLORIDE 0.9 % IR SOLN
Status: DC | PRN
  Administered 2017-03-12: 3000 mL

## 2017-03-12 MED ORDER — ONDANSETRON HCL 4 MG/2ML IJ SOLN
INTRAMUSCULAR | Status: AC
  Filled 2017-03-12: qty 2

## 2017-03-12 MED ORDER — AMLODIPINE BESYLATE 5 MG PO TABS
5.0000 mg | ORAL_TABLET | Freq: Every day | ORAL | Status: DC
  Administered 2017-03-12 – 2017-03-13 (×2): 5 mg via ORAL
  Filled 2017-03-12 (×2): qty 1

## 2017-03-12 MED ORDER — IOHEXOL 300 MG/ML  SOLN
INTRAMUSCULAR | Status: DC | PRN
  Administered 2017-03-12: 10 mL

## 2017-03-12 MED ORDER — CIPROFLOXACIN IN D5W 400 MG/200ML IV SOLN
INTRAVENOUS | Status: AC
  Filled 2017-03-12: qty 200

## 2017-03-12 MED ORDER — FENTANYL CITRATE (PF) 100 MCG/2ML IJ SOLN
INTRAMUSCULAR | Status: AC
  Filled 2017-03-12: qty 2

## 2017-03-12 MED ORDER — ONDANSETRON HCL 4 MG/2ML IJ SOLN
INTRAMUSCULAR | Status: DC | PRN
  Administered 2017-03-12: 4 mg via INTRAVENOUS

## 2017-03-12 MED ORDER — ATORVASTATIN CALCIUM 40 MG PO TABS
80.0000 mg | ORAL_TABLET | Freq: Every day | ORAL | Status: DC
  Administered 2017-03-12: 80 mg via ORAL
  Filled 2017-03-12: qty 2

## 2017-03-12 MED ORDER — TIMOLOL MALEATE 0.5 % OP SOLN
1.0000 [drp] | Freq: Two times a day (BID) | OPHTHALMIC | Status: DC
  Filled 2017-03-12: qty 5

## 2017-03-12 MED ORDER — KETOROLAC TROMETHAMINE 15 MG/ML IJ SOLN
15.0000 mg | Freq: Once | INTRAMUSCULAR | Status: AC
  Administered 2017-03-12: 15 mg via INTRAVENOUS
  Filled 2017-03-12: qty 1

## 2017-03-12 MED ORDER — PROPOFOL 10 MG/ML IV BOLUS
INTRAVENOUS | Status: AC
  Filled 2017-03-12: qty 20

## 2017-03-12 MED ORDER — MORPHINE SULFATE (PF) 2 MG/ML IV SOLN: 2.0000 mg | INTRAVENOUS | Status: DC | PRN

## 2017-03-12 MED ORDER — CLOPIDOGREL BISULFATE 75 MG PO TABS
75.0000 mg | ORAL_TABLET | Freq: Every day | ORAL | Status: DC
  Administered 2017-03-12 – 2017-03-13 (×2): 75 mg via ORAL
  Filled 2017-03-12 (×2): qty 1

## 2017-03-12 MED ORDER — PROPOFOL 10 MG/ML IV BOLUS
INTRAVENOUS | Status: DC | PRN
  Administered 2017-03-12: 160 mg via INTRAVENOUS

## 2017-03-12 MED ORDER — INSULIN ASPART 100 UNIT/ML ~~LOC~~ SOLN: 0.0000 [IU] | Freq: Three times a day (TID) | SUBCUTANEOUS | Status: DC

## 2017-03-12 MED ORDER — LATANOPROST 0.005 % OP SOLN
1.0000 [drp] | Freq: Every day | OPHTHALMIC | Status: DC
Start: 2017-03-12 — End: 2017-03-13
  Filled 2017-03-12: qty 2.5

## 2017-03-12 MED ORDER — LIDOCAINE HCL (CARDIAC) 20 MG/ML IV SOLN
INTRAVENOUS | Status: DC | PRN
  Administered 2017-03-12: 60 mg via INTRATRACHEAL

## 2017-03-12 MED ORDER — METOPROLOL SUCCINATE ER 50 MG PO TB24
50.0000 mg | ORAL_TABLET | Freq: Two times a day (BID) | ORAL | Status: DC
  Filled 2017-03-12 (×2): qty 1

## 2017-03-12 MED ORDER — FENTANYL CITRATE (PF) 100 MCG/2ML IJ SOLN
INTRAMUSCULAR | Status: DC | PRN
  Administered 2017-03-12 (×2): 50 ug via INTRAVENOUS

## 2017-03-12 MED ORDER — ONDANSETRON HCL 4 MG/2ML IJ SOLN: 4.0000 mg | INTRAMUSCULAR | Status: DC | PRN

## 2017-03-12 MED ORDER — MONTELUKAST SODIUM 10 MG PO TABS
10.0000 mg | ORAL_TABLET | Freq: Every day | ORAL | Status: DC
  Filled 2017-03-12 (×2): qty 1

## 2017-03-12 MED ORDER — LACTATED RINGERS IV SOLN
INTRAVENOUS | Status: DC
  Administered 2017-03-12 (×3): via INTRAVENOUS

## 2017-03-12 MED ORDER — ACETAMINOPHEN 325 MG PO TABS: 650.0000 mg | ORAL_TABLET | ORAL | Status: DC | PRN

## 2017-03-12 MED ORDER — LIDOCAINE 2% (20 MG/ML) 5 ML SYRINGE
INTRAMUSCULAR | Status: AC
  Filled 2017-03-12: qty 5

## 2017-03-12 SURGICAL SUPPLY — 15 items
BAG URO CATCHER STRL LF (MISCELLANEOUS) ×3 IMPLANT
BASKET ZERO TIP NITINOL 2.4FR (BASKET) IMPLANT
BSKT STON RTRVL ZERO TP 2.4FR (BASKET)
CATH INTERMIT  6FR 70CM (CATHETERS) ×2 IMPLANT
CLOTH BEACON ORANGE TIMEOUT ST (SAFETY) ×3 IMPLANT
COVER SURGICAL LIGHT HANDLE (MISCELLANEOUS) ×1 IMPLANT
GLOVE BIOGEL M STRL SZ7.5 (GLOVE) ×7 IMPLANT
GOWN STRL REUS W/TWL LRG LVL3 (GOWN DISPOSABLE) ×6 IMPLANT
GUIDEWIRE ANG ZIPWIRE 038X150 (WIRE) IMPLANT
GUIDEWIRE STR DUAL SENSOR (WIRE) ×3 IMPLANT
MANIFOLD NEPTUNE II (INSTRUMENTS) ×3 IMPLANT
PACK CYSTO (CUSTOM PROCEDURE TRAY) ×3 IMPLANT
STENT CONTOUR 6FRX26X.038 (STENTS) ×2 IMPLANT
TUBING CONNECTING 10 (TUBING) ×2 IMPLANT
TUBING CONNECTING 10' (TUBING) ×1

## 2017-03-12 NOTE — Anesthesia Procedure Notes (Signed)
Procedure Name: LMA Insertion Date/Time: 03/12/2017 5:50 PM Performed by: [place] Crista Nuon C Pre-anesthesia Checklist: Patient identified, Emergency Drugs available, Suction available and Patient being monitored Patient Re-evaluated:Patient Re-evaluated prior to induction Oxygen Delivery Method: Circle system utilized Preoxygenation: Pre-oxygenation with 100% oxygen Induction Type: IV induction Ventilation: Mask ventilation without difficulty LMA: LMA inserted LMA Size: 4.0 Number of attempts: 1 Airway Equipment and Method: Bite block Placement Confirmation: positive ETCO2 Tube secured with: Tape Dental Injury: Teeth and Oropharynx as per pre-operative assessment

## 2017-03-12 NOTE — Consult Note (Signed)
Urology Consult  Referring physician: Evlyn Courier Reason for referral: Ureter stone and renal failure  Chief Complaint: Ureter stone and renal failure  History of Present Illness: Intermittent right flank pain and nausea over night; no fever; feels otherwise OK and has not eaten since yesterday Cardiac surgery 18 months ago on plavix Modifying factors: There are no other modifying factors  Associated signs and symptoms: There are no other associated signs and symptoms Aggravating and relieving factors: There are no other aggravating or relieving factors Severity: Moderate Duration: Persistent  Past Medical History:  Diagnosis Date  . Allergy   . Benign essential HTN 08/08/2015  . CAD (coronary artery disease), native coronary artery 08/28/2015   Severe multivessel ASCAD s/p CABG hybrid with LIMA to LAD and diag and PCI of the RCA.   [name] Chronic back pain    L5-S1  . Chronic kidney disease, stage 3    multiple kidney stones  . Diabetes mellitus without complication ([place])   . H/O hiatal hernia   . Hepatic cyst 11/01/2016  . History of acute renal failure 2015   secondary to stones  . Hyperlipidemia 08/08/2015  . NAFLD (nonalcoholic fatty liver disease) 11/01/2016  . Splenic cyst 11/01/2016  . Ureteral calculus 01/06/2014   Past Surgical History:  Procedure Laterality Date  . ANGIOPLASTY N/A 08/21/2015   Procedure: ANGIOPLASTY WITH PERCUTANEOUS CORONARY INTERVENTION WITH DES TO RIGHT CORONARY ARTERY;  Surgeon: Jettie Booze, MD;  Location: [facility];  Service: Cardiovascular;  Laterality: N/A;  . BACK SURGERY  2006   microdisectomy L5-S1  . CARDIAC CATHETERIZATION N/A 08/18/2015   Procedure: Left Heart Cath and Coronary Angiography;  Surgeon: Jettie Booze, MD;  Location: [facility];  Service: Cardiovascular;  Laterality: N/A;  . CARDIAC CATHETERIZATION N/A 08/21/2015   Procedure: Coronary Stent Intervention;  Surgeon: Jettie Booze, MD;  Location: [facility];   Service: Cardiovascular;  Laterality: N/A;  . CARDIAC CATHETERIZATION  08/21/2015   Procedure: Bypass Graft Angiography;  Surgeon: Jettie Booze, MD;  Location: [facility];  Service: Cardiovascular;;  . CARDIOVASCULAR STRESS TEST  08/15/2015  . COLONOSCOPY    . CORONARY ARTERY BYPASS GRAFT N/A 08/21/2015   Procedure: OFF PUMP CORONARY ARTERY BYPASS GRAFTING (CABG), TIMES TWO, USING LEFT INTERNAL MAMMARY ARTERY, RIGHT GREATER SAPHENOUS VEIN HARVESTED ENDOSCOPICALLY;  Surgeon: [name], MD;  Location: [facility];  Service: Open Heart Surgery;  Laterality: N/A;  . CYSTOSCOPY WITH RETROGRADE PYELOGRAM, URETEROSCOPY AND STENT PLACEMENT Right 01/06/2014   Procedure: CYSTOSCOPY WITH RETROGRADE PYELOGRAM, URETEROSCOPY AND STENT PLACEMENT;  Surgeon: Bernestine Amass, MD;  [facility];  Service: Urology;  Laterality: Right;  . ESOPHAGOGASTRODUODENOSCOPY ENDOSCOPY     normal  . LITHOTRIPSY     2-3 times in past  . TEE WITHOUT CARDIOVERSION N/A 08/21/2015   Procedure: TRANSESOPHAGEAL ECHOCARDIOGRAM (TEE);  Surgeon: [name], MD;  Location: [facility];  Service: Open Heart Surgery;  Laterality: N/A;  . TONSILLECTOMY    . WISDOM TOOTH EXTRACTION      Medications: I have reviewed the patient's current medications. Allergies:  Allergies  Allergen Reactions  . Vicodin [Hydrocodone-Acetaminophen] Nausea And Vomiting  . Hydrocodone Nausea And Vomiting  . Oxycontin [Oxycodone Hcl] Nausea And Vomiting  . Percocet [Oxycodone-Acetaminophen] Nausea And Vomiting    Family History  Problem Relation Age of Onset  . Kidney failure Mother   . Heart attack Mother        angina   .  Diabetes Mother   . Throat cancer Father   . Diabetes Sister   . Colon cancer Neg Hx   . Colon polyps Neg Hx   . Rectal cancer Neg Hx   . Stomach cancer Neg Hx    Social History:  reports that he has never smoked. He has never used smokeless tobacco. He reports that he drinks alcohol. He reports that he  does not use drugs.  ROS: All systems are reviewed and negative except as noted. Rest negative  Physical Exam:  Vital signs in last 24 hours: Temp:  [97.4 F (36.3 C)] 97.4 F (36.3 C) (08/15 0849) Pulse Rate:  [65] 65 (08/15 0849) Resp:  [15] 15 (08/15 0849) BP: (167)/(84) 167/84 (08/15 0849) SpO2:  [96 %] 96 % (08/15 0849)  Cardiovascular: Skin warm; not flushed Respiratory: Breaths quiet; no shortness of breath Abdomen: No masses Neurological: Normal sensation to touch Musculoskeletal: Normal motor function arms and legs Lymphatics: No inguinal adenopathy Skin: No rashes Genitourinary:non toxic and no sepsis  Laboratory Data:  No results found for this or any previous visit (from the past 72 hour(s)). No results found for this or any previous visit (from the past 240 hour(s)). Creatinine: No results for input(s): CREATININE in the last 168 hours.  Xrays: See report/chart reviewed  Impression/Assessment:  Large right ureter stone and Cr 8 (baseline for similar few years ago was 3 ?);   Plan:  Pros and cons and risk and sequelae of stent note; picture drawn; bit concerned re not getting stent in based on size- mentioned need for perc tube Keep NPO Check labs and EKG Keep in overnight for labs observation  Ruari Duggan A 03/12/2017, 10:46 AM

## 2017-03-12 NOTE — Interval H&P Note (Signed)
History and Physical Interval Note:  03/12/2017 3:17 PM  Jeffrey Holmes  has presented today for surgery, with the diagnosis of ureteral stones  The various methods of treatment have been discussed with the patient and family. After consideration of risks, benefits and other options for treatment, the patient has consented to  Procedure(s): CYSTOSCOPY WITH RETROGRADE PYELOGRAM/URETERAL STENT PLACEMENT (Right) as a surgical intervention .  The patient's history has been reviewed, patient examined, no change in status, stable for surgery.  I have reviewed the patient's chart and labs.  Questions were answered to the patient's satisfaction.     Gabriel Paulding A

## 2017-03-12 NOTE — H&P (View-Only) (Signed)
Urology Consult  Referring physician: Evlyn Courier Reason for referral: Ureter stone and renal failure  Chief Complaint: Ureter stone and renal failure  History of Present Illness: Intermittent right flank pain and nausea over night; no fever; feels otherwise OK and has not eaten since yesterday Cardiac surgery 18 months ago on plavix Modifying factors: There are no other modifying factors  Associated signs and symptoms: There are no other associated signs and symptoms Aggravating and relieving factors: There are no other aggravating or relieving factors Severity: Moderate Duration: Persistent  Past Medical History:  Diagnosis Date  . Allergy   . Benign essential HTN 08/08/2015  . CAD (coronary artery disease), native coronary artery 08/28/2015   Severe multivessel ASCAD s/p CABG hybrid with LIMA to LAD and diag and PCI of the RCA.   [name] Chronic back pain    L5-S1  . Chronic kidney disease, stage 3    multiple kidney stones  . Diabetes mellitus without complication ([place])   . H/O hiatal hernia   . Hepatic cyst 11/01/2016  . History of acute renal failure 2015   secondary to stones  . Hyperlipidemia 08/08/2015  . NAFLD (nonalcoholic fatty liver disease) 11/01/2016  . Splenic cyst 11/01/2016  . Ureteral calculus 01/06/2014   Past Surgical History:  Procedure Laterality Date  . ANGIOPLASTY N/A 08/21/2015   Procedure: ANGIOPLASTY WITH PERCUTANEOUS CORONARY INTERVENTION WITH DES TO RIGHT CORONARY ARTERY;  Surgeon: Jettie Booze, MD;  Location: [place];  Service: Cardiovascular;  Laterality: N/A;  . BACK SURGERY  2006   microdisectomy L5-S1  . CARDIAC CATHETERIZATION N/A 08/18/2015   Procedure: Left Heart Cath and Coronary Angiography;  Surgeon: Jettie Booze, MD;  Location: [place] [facility];  Service: Cardiovascular;  Laterality: N/A;  . CARDIAC CATHETERIZATION N/A 08/21/2015   Procedure: Coronary Stent Intervention;  Surgeon: Jettie Booze, MD;  Location: [place] [facility];   Service: Cardiovascular;  Laterality: N/A;  . CARDIAC CATHETERIZATION  08/21/2015   Procedure: Bypass Graft Angiography;  Surgeon: Jettie Booze, MD;  Location: [place] [facility];  Service: Cardiovascular;;  . CARDIOVASCULAR STRESS TEST  08/15/2015  . COLONOSCOPY    . CORONARY ARTERY BYPASS GRAFT N/A 08/21/2015   Procedure: OFF PUMP CORONARY ARTERY BYPASS GRAFTING (CABG), TIMES TWO, USING LEFT INTERNAL MAMMARY ARTERY, RIGHT GREATER SAPHENOUS VEIN HARVESTED ENDOSCOPICALLY;  Surgeon: [name], MD;  Location: [facility];  Service: Open Heart Surgery;  Laterality: N/A;  . CYSTOSCOPY WITH RETROGRADE PYELOGRAM, URETEROSCOPY AND STENT PLACEMENT Right 01/06/2014   Procedure: CYSTOSCOPY WITH RETROGRADE PYELOGRAM, URETEROSCOPY AND STENT PLACEMENT;  Surgeon: Bernestine Amass, MD;  [facility];  Service: Urology;  Laterality: Right;  . ESOPHAGOGASTRODUODENOSCOPY ENDOSCOPY     normal  . LITHOTRIPSY     2-3 times in past  . TEE WITHOUT CARDIOVERSION N/A 08/21/2015   Procedure: TRANSESOPHAGEAL ECHOCARDIOGRAM (TEE);  Surgeon: [name], MD;  Location: [place];  Service: Open Heart Surgery;  Laterality: N/A;  . TONSILLECTOMY    . WISDOM TOOTH EXTRACTION      Medications: I have reviewed the patient's current medications. Allergies:  Allergies  Allergen Reactions  . Vicodin [Hydrocodone-Acetaminophen] Nausea And Vomiting  . Hydrocodone Nausea And Vomiting  . Oxycontin [Oxycodone Hcl] Nausea And Vomiting  . Percocet [Oxycodone-Acetaminophen] Nausea And Vomiting    Family History  Problem Relation Age of Onset  . Kidney failure Mother   . Heart attack Mother        angina   .  Diabetes Mother   . Throat cancer Father   . Diabetes Sister   . Colon cancer Neg Hx   . Colon polyps Neg Hx   . Rectal cancer Neg Hx   . Stomach cancer Neg Hx    Social History:  reports that he has never smoked. He has never used smokeless tobacco. He reports that he drinks alcohol. He reports that he  does not use drugs.  ROS: All systems are reviewed and negative except as noted. Rest negative  Physical Exam:  Vital signs in last 24 hours: Temp:  [97.4 F (36.3 C)] 97.4 F (36.3 C) (08/15 0849) Pulse Rate:  [65] 65 (08/15 0849) Resp:  [15] 15 (08/15 0849) BP: (167)/(84) 167/84 (08/15 0849) SpO2:  [96 %] 96 % (08/15 0849)  Cardiovascular: Skin warm; not flushed Respiratory: Breaths quiet; no shortness of breath Abdomen: No masses Neurological: Normal sensation to touch Musculoskeletal: Normal motor function arms and legs Lymphatics: No inguinal adenopathy Skin: No rashes Genitourinary:non toxic and no sepsis  Laboratory Data:  No results found for this or any previous visit (from the past 72 hour(s)). No results found for this or any previous visit (from the past 240 hour(s)). Creatinine: No results for input(s): CREATININE in the last 168 hours.  Xrays: See report/chart reviewed  Impression/Assessment:  Large right ureter stone and Cr 8 (baseline for similar few years ago was 3 ?);   Plan:  Pros and cons and risk and sequelae of stent note; picture drawn; bit concerned re not getting stent in based on size- mentioned need for perc tube Keep NPO Check labs and EKG Keep in overnight for labs observation  [place] Spainhower A 03/12/2017, 10:46 AM

## 2017-03-12 NOTE — Anesthesia Preprocedure Evaluation (Addendum)
Anesthesia Evaluation  Patient identified by MRN, date of birth, ID band Patient awake    Reviewed: Allergy & Precautions, NPO status , Patient's Chart, lab work & pertinent test results  Airway Mallampati: III  TM Distance: >3 FB Neck ROM: Full    Dental  (+) Dental Advisory Given   Pulmonary neg pulmonary ROS,    breath sounds clear to auscultation       Cardiovascular hypertension, Pt. on medications + CAD, + Cardiac Stents and + CABG   Rhythm:Regular Rate:Normal     Neuro/Psych negative neurological ROS     GI/Hepatic Neg liver ROS, hiatal hernia,   Endo/Other  diabetes, Type 2, Insulin Dependent  Renal/GU CRFRenal disease     Musculoskeletal   Abdominal   Peds  Hematology negative hematology ROS (+)   Anesthesia Other Findings   Reproductive/Obstetrics                            Lab Results  Component Value Date   WBC 11.3 (H) 03/12/2017   HGB 16.2 03/12/2017   HCT 46.3 03/12/2017   MCV 88.4 03/12/2017   PLT 239 03/12/2017   Lab Results  Component Value Date   CREATININE 9.14 (H) 03/12/2017   BUN 78 (H) 03/12/2017   NA 144 03/12/2017   K 4.9 03/12/2017   CL 111 03/12/2017   CO2 19 (L) 03/12/2017    Anesthesia Physical Anesthesia Plan  ASA: III  Anesthesia Plan: General   Post-op Pain Management:    Induction: Intravenous  PONV Risk Score and Plan: 2 and Ondansetron and Dexamethasone  Airway Management Planned: LMA  Additional Equipment:   Intra-op Plan:   Post-operative Plan: Extubation in OR  Informed Consent: I have reviewed the patients History and Physical, chart, labs and discussed the procedure including the risks, benefits and alternatives for the proposed anesthesia with the patient or authorized representative who has indicated his/her understanding and acceptance.   Dental advisory given  Plan Discussed with: CRNA  Anesthesia Plan Comments:         Anesthesia Quick Evaluation

## 2017-03-12 NOTE — ED Provider Notes (Signed)
[facility]PT [facility] Provider Note   CSN: [id number] Arrival date & time: 03/12/17  6203     History   Chief Complaint Chief Complaint  Patient presents with  . Abdominal Pain    HPI Jeffrey Holmes is a 66 y.o. male.  HPI  66 year old male with a history of nephrolithiasis, single kidney presents to the emergency department from urology clinic for stenting. Patient reports that he's been having right flank pain for several weeks and is being followed by urology who noted a large right ureter stone which is now obstructing and causing renal insufficiency. Pain is described as mild to moderate, cramping in nature. No real exacerbating or relieving factors. No associated dysuria, nausea, vomiting. No other physical complaints.   Past Medical History:  Diagnosis Date  . Allergy   . Benign essential HTN 08/08/2015  . CAD (coronary artery disease), native coronary artery 08/28/2015   Severe multivessel ASCAD s/p CABG hybrid with LIMA to LAD and diag and PCI of the RCA.   [name] Chronic back pain    L5-S1  . Chronic kidney disease, stage 3    multiple kidney stones  . Diabetes mellitus without complication ([place])   . H/O hiatal hernia   . Hepatic cyst 11/01/2016  . History of acute renal failure 2015   secondary to stones  . Hyperlipidemia 08/08/2015  . NAFLD (nonalcoholic fatty liver disease) 11/01/2016  . Splenic cyst 11/01/2016  . Ureteral calculus 01/06/2014    Patient Active Problem List   Diagnosis Date Noted  . Ureteral stone 03/12/2017  . Diabetes ([place]) 11/11/2016  . Hepatic cyst 11/01/2016  . NAFLD (nonalcoholic fatty liver disease) 11/01/2016  . Splenic cyst 11/01/2016  . CAD (coronary artery disease), native coronary artery 11/03/2015  . Abnormal result of cardiovascular function study 08/16/2015  . Chronic renal insufficiency, stage III (moderate) 08/16/2015  . Benign essential HTN 08/08/2015  . Hyperlipidemia 08/08/2015    Past Surgical History:  Procedure Laterality  Date  . ANGIOPLASTY N/A 08/21/2015   Procedure: ANGIOPLASTY WITH PERCUTANEOUS CORONARY INTERVENTION WITH DES TO RIGHT CORONARY ARTERY;  Surgeon: Jettie Booze, MD;  Location: [facility];  Service: Cardiovascular;  Laterality: N/A;  . BACK SURGERY  2006   microdisectomy L5-S1  . CARDIAC CATHETERIZATION N/A 08/18/2015   Procedure: Left Heart Cath and Coronary Angiography;  Surgeon: Jettie Booze, MD;  Location: [facility];  Service: Cardiovascular;  Laterality: N/A;  . CARDIAC CATHETERIZATION N/A 08/21/2015   Procedure: Coronary Stent Intervention;  Surgeon: Jettie Booze, MD;  Location: [facility];  Service: Cardiovascular;  Laterality: N/A;  . CARDIAC CATHETERIZATION  08/21/2015   Procedure: Bypass Graft Angiography;  Surgeon: Jettie Booze, MD;  Location: [facility];  Service: Cardiovascular;;  . CARDIOVASCULAR STRESS TEST  08/15/2015  . COLONOSCOPY    . CORONARY ARTERY BYPASS GRAFT N/A 08/21/2015   Procedure: OFF PUMP CORONARY ARTERY BYPASS GRAFTING (CABG), TIMES TWO, USING LEFT INTERNAL MAMMARY ARTERY, RIGHT GREATER SAPHENOUS VEIN HARVESTED ENDOSCOPICALLY;  Surgeon: [name], MD;  Location: [facility];  Service: Open Heart Surgery;  Laterality: N/A;  . CYSTOSCOPY W/ URETERAL STENT PLACEMENT Right 03/12/2017   Procedure: CYSTOSCOPY WITH RETROGRADE PYELOGRAM/URETERAL STENT PLACEMENT;  Surgeon: Bjorn Loser, MD;  Location: [facility];  Service: Urology;  Laterality: Right;  . CYSTOSCOPY WITH RETROGRADE PYELOGRAM, URETEROSCOPY AND STENT PLACEMENT Right 01/06/2014   Procedure: CYSTOSCOPY WITH RETROGRADE PYELOGRAM, URETEROSCOPY AND STENT PLACEMENT;  Surgeon: Bernestine Amass, MD;  Location:  [facility];  Service: Urology;  Laterality: Right;  . ESOPHAGOGASTRODUODENOSCOPY ENDOSCOPY     normal  . LITHOTRIPSY     2-3 times in past  . TEE WITHOUT CARDIOVERSION N/A 08/21/2015   Procedure: TRANSESOPHAGEAL ECHOCARDIOGRAM (TEE);  Surgeon: [name], MD;   Location: [facility];  Service: Open Heart Surgery;  Laterality: N/A;  . TONSILLECTOMY    . WISDOM TOOTH EXTRACTION         Home Medications    Prior to Admission medications   Medication Sig Start Date End Date Taking? Authorizing Provider  amLODipine (NORVASC) 5 MG tablet Take 1 tablet (5 mg total) by mouth daily. 12/12/15  Yes Turner, Eber Hong, MD  atorvastatin (LIPITOR) 80 MG tablet TAKE ONE TABLET BY MOUTH ONCE DAILY 12/18/16  Yes Turner, Eber Hong, MD  clopidogrel (PLAVIX) 75 MG tablet TAKE ONE TABLET BY MOUTH ONCE DAILY 01/27/17  Yes Turner, Eber Hong, MD  etodolac (LODINE) 400 MG tablet Take 400 mg by mouth daily as needed for pain. 01/20/17  Yes [provider]  Evolocumab (REPATHA SURECLICK) 440 MG/ML SOAJ Inject 1 pen into the skin every 14 (fourteen) days. 12/24/16  Yes Turner, Eber Hong, MD  Insulin Glargine (BASAGLAR KWIKPEN) 100 UNIT/ML SOPN Inject 0.2 mLs (20 Units total) into the skin at bedtime. 10/10/16  Yes Renato Shin, MD  insulin lispro (HUMALOG KWIKPEN) 100 UNIT/ML KiwkPen 3 times a day (just before each meal) 15-10-15, and pen needles 4/day Patient taking differently: Inject 10 Units into the skin 3 (three) times daily.  10/10/16  Yes Renato Shin, MD  montelukast (SINGULAIR) 10 MG tablet Take 10 mg by mouth daily.  09/09/16  Yes [provider]  nitroGLYCERIN (NITROSTAT) 0.4 MG SL tablet Place 0.4 mg under the tongue as needed. Place 1 tablet under your tongue every 5 minutes as needed for chest pain for a max of 3 doses 08/08/15  Yes [provider]  ondansetron (ZOFRAN-ODT) 4 MG disintegrating tablet Take 4 mg by mouth every 8 (eight) hours as needed for nausea/vomiting. 01/20/17  Yes [provider]  timolol (TIMOPTIC) 0.5 % ophthalmic solution Place 1 drop into both eyes 2 (two) times daily.  07/19/15  Yes [provider]  travoprost, benzalkonium, (TRAVATAN) 0.004 % ophthalmic solution Place 1 drop into both eyes at bedtime.   Yes  [provider]  aspirin EC 81 MG tablet Take 1 tablet (81 mg total) by mouth daily. Patient not taking: Reported on 01/02/2017 08/25/15   Barrett, Lodema Hong, PA-C  metoprolol succinate (TOPROL-XL) 50 MG 24 hr tablet Take 1 tablet (50 mg total) by mouth 2 (two) times daily. Take with or immediately following a meal. Patient not taking: Reported on 03/12/2017 01/03/16   Burtis Junes, NP    Family History Family History  Problem Relation Age of Onset  . Kidney failure Mother   . Heart attack Mother        angina   . Diabetes Mother   . Throat cancer Father   . Diabetes Sister   . Colon cancer Neg Hx   . Colon polyps Neg Hx   . Rectal cancer Neg Hx   . Stomach cancer Neg Hx     Social History Social History  Substance Use Topics  . Smoking status: Never Smoker  . Smokeless tobacco: Never Used  . Alcohol use Yes     Comment: occasional every 2-3 months     Allergies   Vicodin [hydrocodone-acetaminophen]; Hydrocodone; Oxycontin [oxycodone  hcl]; and Percocet [oxycodone-acetaminophen]   Review of Systems Review of Systems All other systems are reviewed and are negative for acute change except as noted in the HPI   Physical Exam Updated Vital Signs BP (!) 153/79 (BP Location: Left Arm)   Pulse 63   Temp 98.3 F (36.8 C) (Oral)   Resp 16   Ht 5\' 5"  (1.651 m)   Wt 81.6 kg (180 lb)   SpO2 98%   BMI 29.95 kg/m   Physical Exam  Constitutional: He is oriented to person, place, and time. He appears well-developed and well-nourished. No distress.  HENT:  Head: Normocephalic and atraumatic.  Nose: Nose normal.  Eyes: Pupils are equal, round, and reactive to light. Conjunctivae and EOM are normal. Right eye exhibits no discharge. Left eye exhibits no discharge. No scleral icterus.  Neck: Normal range of motion. Neck supple.  Cardiovascular: Normal rate and regular rhythm.  Exam reveals no gallop and no friction rub.   No murmur heard. Pulmonary/Chest: Effort normal  and breath sounds normal. No stridor. No respiratory distress. He has no rales.  Abdominal: Soft. He exhibits no distension. There is no tenderness.  Musculoskeletal: He exhibits no edema or tenderness.  Neurological: He is alert and oriented to person, place, and time.  Skin: Skin is warm and dry. No rash noted. He is not diaphoretic. No erythema.  Psychiatric: He has a normal mood and affect.  Vitals reviewed.    ED Treatments / Results  Labs (all labs ordered are listed, but only abnormal results are displayed) Labs Reviewed  LIPASE, BLOOD - Abnormal; Notable for the following:       Result Value   Lipase 96 (*)    All other components within normal limits  COMPREHENSIVE METABOLIC PANEL - Abnormal; Notable for the following:    CO2 19 (*)    BUN 78 (*)    Creatinine, Ser 9.14 (*)    AST 12 (*)    ALT 14 (*)    GFR calc non Af Amer 5 (*)    GFR calc Af Amer 6 (*)    All other components within normal limits  CBC - Abnormal; Notable for the following:    WBC 11.3 (*)    All other components within normal limits  URINALYSIS, ROUTINE W REFLEX MICROSCOPIC - Abnormal; Notable for the following:    Color, Urine STRAW (*)    Hgb urine dipstick SMALL (*)    Protein, ur >=300 (*)    Bacteria, UA RARE (*)    All other components within normal limits  GLUCOSE, CAPILLARY - Abnormal; Notable for the following:    Glucose-Capillary 121 (*)    All other components within normal limits  BASIC METABOLIC PANEL - Abnormal; Notable for the following:    Sodium 148 (*)    Chloride 114 (*)    Glucose, Bld 100 (*)    BUN 71 (*)    Creatinine, Ser 7.77 (*)    GFR calc non Af Amer 6 (*)    GFR calc Af Amer 7 (*)    All other components within normal limits  GLUCOSE, CAPILLARY - Abnormal; Notable for the following:    Glucose-Capillary 133 (*)    All other components within normal limits  GLUCOSE, CAPILLARY - Abnormal; Notable for the following:    Glucose-Capillary 134 (*)    All other  components within normal limits  GLUCOSE, CAPILLARY    EKG  EKG Interpretation  Date/Time:  Wednesday March 12 2017 12:05:23 EDT Ventricular Rate:  54 PR Interval:    QRS Duration: 90 QT Interval:  436 QTC Calculation: 414 R Axis:   85 Text Interpretation:  Sinus rhythm Borderline right axis deviation Minimal ST elevation, inferior leads NO STEMI  Confirmed by Addison Lank [id number]) on 03/12/2017 1:24:11 PM       Radiology Dg C-arm 1-60 Min-no Report  Result Date: 03/12/2017 Fluoroscopy was utilized by the requesting physician.  No radiographic interpretation.    Procedures Procedures (including critical care time)  Medications Ordered in ED Medications  ketorolac (TORADOL) 15 MG/ML injection 15 mg (15 mg Intravenous Given 03/12/17 1133)  ciprofloxacin (CIPRO) 400 MG/200ML IVPB (  Override pull for Anesthesia 03/12/17 1516)     Initial Impression / Assessment and Plan / ED Course  I have reviewed the triage vital signs and the nursing notes.  Pertinent labs & imaging results that were available during my care of the patient were reviewed by me and considered in my medical decision making (see chart for details).     Renal failure in the setting of an obstructing stone. Urology consultation who is already aware the patient and is planning for stenting.  Final Clinical Impressions(s) / ED Diagnoses   Final diagnoses:  Ureterolithiasis  AKI (acute kidney injury) ([place])      Cardama, Grayce Sessions, MD 03/13/17 1444

## 2017-03-12 NOTE — H&P (Signed)
Chief Complaint: Ureter stone and renal failure  History of Present Illness: Intermittent right flank pain and nausea over night; no fever; feels otherwise OK and has not eaten since yesterday Cardiac surgery 18 months ago on plavix Modifying factors: There are no other modifying factors  Associated signs and symptoms: There are no other associated signs and symptoms Aggravating and relieving factors: There are no other aggravating or relieving factors Severity: Moderate Duration: Persistent  Past Medical History:  Diagnosis Date  . Allergy   . Benign essential HTN 08/08/2015  . CAD (coronary artery disease), native coronary artery 08/28/2015   Severe multivessel ASCAD s/p CABG hybrid with LIMA to LAD and diag and PCI of the RCA.   [name] Chronic back pain    L5-S1  . Chronic kidney disease, stage 3    multiple kidney stones  . Diabetes mellitus without complication ([place])   . H/O hiatal hernia   . Hepatic cyst 11/01/2016  . History of acute renal failure 2015   secondary to stones  . Hyperlipidemia 08/08/2015  . NAFLD (nonalcoholic fatty liver disease) 11/01/2016  . Splenic cyst 11/01/2016  . Ureteral calculus 01/06/2014   Past Surgical History:  Procedure Laterality Date  . ANGIOPLASTY N/A 08/21/2015   Procedure: ANGIOPLASTY WITH PERCUTANEOUS CORONARY INTERVENTION WITH DES TO RIGHT CORONARY ARTERY;  Surgeon: Jettie Booze, MD;  Location: [facility];  Service: Cardiovascular;  Laterality: N/A;  . BACK SURGERY  2006   microdisectomy L5-S1  . CARDIAC CATHETERIZATION N/A 08/18/2015   Procedure: Left Heart Cath and Coronary Angiography;  Surgeon: Jettie Booze, MD;  Location: [facility];  Service: Cardiovascular;  Laterality: N/A;  . CARDIAC CATHETERIZATION N/A 08/21/2015   Procedure: Coronary Stent Intervention;  Surgeon: Jettie Booze, MD;  Location: [facility];  Service: Cardiovascular;  Laterality: N/A;  . CARDIAC CATHETERIZATION  08/21/2015   Procedure: Bypass  Graft Angiography;  Surgeon: Jettie Booze, MD;  Location: [facility];  Service: Cardiovascular;;  . CARDIOVASCULAR STRESS TEST  08/15/2015  . COLONOSCOPY    . CORONARY ARTERY BYPASS GRAFT N/A 08/21/2015   Procedure: OFF PUMP CORONARY ARTERY BYPASS GRAFTING (CABG), TIMES TWO, USING LEFT INTERNAL MAMMARY ARTERY, RIGHT GREATER SAPHENOUS VEIN HARVESTED ENDOSCOPICALLY;  Surgeon: [name], MD;  Location: [facility];  Service: Open Heart Surgery;  Laterality: N/A;  . CYSTOSCOPY WITH RETROGRADE PYELOGRAM, URETEROSCOPY AND STENT PLACEMENT Right 01/06/2014   Procedure: CYSTOSCOPY WITH RETROGRADE PYELOGRAM, URETEROSCOPY AND STENT PLACEMENT;  Surgeon: Bernestine Amass, MD;  [facility];  Service: Urology;  Laterality: Right;  . ESOPHAGOGASTRODUODENOSCOPY ENDOSCOPY     normal  . LITHOTRIPSY     2-3 times in past  . TEE WITHOUT CARDIOVERSION N/A 08/21/2015   Procedure: TRANSESOPHAGEAL ECHOCARDIOGRAM (TEE);  Surgeon: [name], MD;  Location: [facility];  Service: Open Heart Surgery;  Laterality: N/A;  . TONSILLECTOMY    . WISDOM TOOTH EXTRACTION      Medications: I have reviewed the patient's current medications. Allergies:  Allergies  Allergen Reactions  . Vicodin [Hydrocodone-Acetaminophen] Nausea And Vomiting  . Hydrocodone Nausea And Vomiting  . Oxycontin [Oxycodone Hcl] Nausea And Vomiting  . Percocet [Oxycodone-Acetaminophen] Nausea And Vomiting    Family History  Problem Relation Age of Onset  . Kidney failure Mother   . Heart attack Mother        angina   . Diabetes Mother   . Throat cancer Father   . Diabetes Sister   .  Colon cancer Neg Hx   . Colon polyps Neg Hx   . Rectal cancer Neg Hx   . Stomach cancer Neg Hx    Social History:  reports that he has never smoked. He has never used smokeless tobacco. He reports that he drinks alcohol. He reports that he does not use drugs.  ROS: All systems are reviewed and negative except as noted. Rest  negative  Physical Exam:  Vital signs in last 24 hours: Temp:  [97.4 F (36.3 C)] 97.4 F (36.3 C) (08/15 0849) Pulse Rate:  [65] 65 (08/15 0849) Resp:  [15] 15 (08/15 0849) BP: (167)/(84) 167/84 (08/15 0849) SpO2:  [96 %] 96 % (08/15 0849)  Cardiovascular: Skin warm; not flushed Respiratory: Breaths quiet; no shortness of breath Abdomen: No masses Neurological: Normal sensation to touch Musculoskeletal: Normal motor function arms and legs Lymphatics: No inguinal adenopathy Skin: No rashes Genitourinary:non toxic and no sepsis  Laboratory Data:  No results found for this or any previous visit (from the past 72 hour(s)). No results found for this or any previous visit (from the past 240 hour(s)). Creatinine: No results for input(s): CREATININE in the last 168 hours.  Xrays: See report/chart reviewed  Impression/Assessment:  Large right ureter stone and Cr 8 (baseline for similar few years ago was 3 ?);   Plan:  Pros and cons and risk and sequelae of stent note; picture drawn; bit concerned re not getting stent in based on size- mentioned need for perc tube Keep NPO Check labs and EKG Keep in overnight for labs observation

## 2017-03-12 NOTE — Op Note (Signed)
Preoperative diagnosis:Right ureteral stone and renal failure Postoperative diagnosis: Right ureteral stone and renal failure Surgery: Cystoscopy and right retrograde ureterogram and insertion of right ureteral stent Surgeon: Dr. Nicki Reaper a Jeffrey Holmes  The patient has a above diagnosis and consented above procedure.  Preoperative antibiotics were given.  Under sterile technique a 23 [place] scope was utilized.  Penile bulbar membranous urethra normal.  He had mild bilobar larger of the prostate.  Trigone and bladder was normal.  There was no periureteral edema or cystitis  Under cystoscopic and fluoroscopic guidance a sensor wire was passed to the mid right ureter just above the right iliac crests.  An open-ended ureteral catheter five [place] was passed and the wire was removed.  Retrograde ureterogram: At a separate procedure with approximate 4 cc of contrast I injected dye gently.  I could see a large filling defect in the proximal right ureter and hydronephrosis.  X-rays were saved for stent positioning  Under fluoroscopic guidance the sensor wire was then passed currently in the upper pole calyx.  A 26 French double-J stent was easily passed to the right renal pelvis curling in the right renal pelvis and bladder.  There was no evidence of infection expelling from the ureter.  X-rays were taken.  Bladder was emptied.  Patient was taken to recovery.  The patient's serum creatinine will be monitored post procedure

## 2017-03-12 NOTE — Interval H&P Note (Signed)
History and Physical Interval Note:  03/12/2017 3:17 PM  Jeffrey Holmes  has presented today for surgery, with the diagnosis of ureteral stones  The various methods of treatment have been discussed with the patient and family. After consideration of risks, benefits and other options for treatment, the patient has consented to  Procedure(s): CYSTOSCOPY WITH RETROGRADE PYELOGRAM/URETERAL STENT PLACEMENT (Right) as a surgical intervention .  The patient's history has been reviewed, patient examined, no change in status, stable for surgery.  I have reviewed the patient's chart and labs.  Questions were answered to the patient's satisfaction.     Darlyn Repsher A

## 2017-03-12 NOTE — ED Triage Notes (Addendum)
Pt c/o RLQ abdominal pain radiating towards medial abdomen, nausea x 1 month. No emesis, diarrhea. States current symptoms are consistent with his recurrent renal calculi. Hx kidney stones, stent placement, 3 lithotripsy procedures. Pt states he had serum testing and CT scan perform at [facility] urology yesterday, creatinine was 8.1. Results not visible in EPIC to this RN.

## 2017-03-12 NOTE — Transfer of Care (Signed)
Immediate Anesthesia Transfer of Care Note  Patient: Jeffrey Holmes  Procedure(s) Performed: Procedure(s): CYSTOSCOPY WITH RETROGRADE PYELOGRAM/URETERAL STENT PLACEMENT (Right)  Patient Location: PACU  Anesthesia Type:General  Level of Consciousness: awake, alert  and oriented  Airway & Oxygen Therapy: Patient Spontanous Breathing and Patient connected to face mask oxygen  Post-op Assessment: Report given to RN and Post -op Vital signs reviewed and stable  Post vital signs: Reviewed and stable  Last Vitals:  Vitals:   03/12/17 1215 03/12/17 1244  BP:  (!) 152/85  Pulse: 60 61  Resp: (!) 25 16  Temp:    SpO2: 99% 100%    Last Pain:  Vitals:   03/12/17 0858  TempSrc:   PainSc: 3          Complications: No apparent anesthesia complications

## 2017-03-12 NOTE — ED Notes (Signed)
Report given to OR RN.

## 2017-03-12 NOTE — Anesthesia Postprocedure Evaluation (Signed)
Anesthesia Post Note  Patient: Jeffrey Holmes  Procedure(s) Performed: Procedure(s) (LRB): CYSTOSCOPY WITH RETROGRADE PYELOGRAM/URETERAL STENT PLACEMENT (Right)     Patient location during evaluation: PACU Anesthesia Type: General Level of consciousness: awake and alert Pain management: pain level controlled Vital Signs Assessment: post-procedure vital signs reviewed and stable Respiratory status: spontaneous breathing, nonlabored ventilation, respiratory function stable and patient connected to nasal cannula oxygen Cardiovascular status: blood pressure returned to baseline and stable Postop Assessment: no signs of nausea or vomiting Anesthetic complications: no    Last Vitals:  Vitals:   03/12/17 1615 03/12/17 1630  BP: (!) 151/79 (!) 155/78  Pulse: 61 (!) 58  Resp: 16 15  Temp:    SpO2: 98% 99%    Last Pain:  Vitals:   03/12/17 1615  TempSrc:   PainSc: Tyler Deis

## 2017-03-13 ENCOUNTER — Other Ambulatory Visit: Payer: Self-pay | Admitting: Urology

## 2017-03-13 ENCOUNTER — Encounter (HOSPITAL_COMMUNITY): Payer: Self-pay | Admitting: Urology

## 2017-03-13 DIAGNOSIS — N201 Calculus of ureter: Secondary | ICD-10-CM | POA: Diagnosis not present

## 2017-03-13 DIAGNOSIS — N179 Acute kidney failure, unspecified: Secondary | ICD-10-CM | POA: Diagnosis not present

## 2017-03-13 DIAGNOSIS — R112 Nausea with vomiting, unspecified: Secondary | ICD-10-CM | POA: Diagnosis not present

## 2017-03-13 DIAGNOSIS — R1031 Right lower quadrant pain: Secondary | ICD-10-CM | POA: Diagnosis not present

## 2017-03-13 LAB — BASIC METABOLIC PANEL
Anion gap: 11 (ref 5–15)
BUN: 71 mg/dL — AB (ref 6–20)
CALCIUM: 9 mg/dL (ref 8.9–10.3)
CO2: 23 mmol/L (ref 22–32)
CREATININE: 7.77 mg/dL — AB (ref 0.61–1.24)
Chloride: 114 mmol/L — ABNORMAL HIGH (ref 101–111)
GFR calc Af Amer: 7 mL/min — ABNORMAL LOW (ref >60)
GFR, EST NON AFRICAN AMERICAN: 6 mL/min — AB (ref >60)
GLUCOSE: 100 mg/dL — AB (ref 65–99)
Potassium: 5 mmol/L (ref 3.5–5.1)
Sodium: 148 mmol/L — ABNORMAL HIGH (ref 135–145)

## 2017-03-13 LAB — GLUCOSE, CAPILLARY: Glucose-Capillary: 134 mg/dL — ABNORMAL HIGH (ref 65–99)

## 2017-03-13 NOTE — Discharge Summary (Signed)
Date of admission: 03/12/2017  Date of discharge: 03/13/2017  Admission diagnosis: ureter stone and renal failure  Discharge diagnosis: ureteral stone and renal failure  Secondary diagnoses: renal failure  History and Physical: For full details, please see admission history and physical. Briefly, Jeffrey Holmes is a 66 y.o. year old patient with above diagnosis.   Hospital Course: Right stent; no issues; improving Cr post op  Laboratory values:  Recent Labs  03/12/17 0902  HGB 16.2  HCT 46.3    Recent Labs  03/12/17 0902 03/13/17 0508  CREATININE 9.14* 7.77*    Disposition: Home  Discharge instruction: The patient was instructed to be ambulatory but told to refrain from heavy lifting, strenuous activity, or driving. Detailed  Discharge medications:  Allergies as of 03/13/2017      Reactions   Vicodin [hydrocodone-acetaminophen] Nausea And Vomiting   Hydrocodone Nausea And Vomiting   Oxycontin [oxycodone Hcl] Nausea And Vomiting   Percocet [oxycodone-acetaminophen] Nausea And Vomiting      Medication List    TAKE these medications   amLODipine 5 MG tablet Commonly known as:  NORVASC Take 1 tablet (5 mg total) by mouth daily.   aspirin EC 81 MG tablet Take 1 tablet (81 mg total) by mouth daily.   atorvastatin 80 MG tablet Commonly known as:  LIPITOR TAKE ONE TABLET BY MOUTH ONCE DAILY   BASAGLAR KWIKPEN 100 UNIT/ML Sopn Inject 0.2 mLs (20 Units total) into the skin at bedtime.   clopidogrel 75 MG tablet Commonly known as:  PLAVIX TAKE ONE TABLET BY MOUTH ONCE DAILY   etodolac 400 MG tablet Commonly known as:  LODINE Take 400 mg by mouth daily as needed for pain.   Evolocumab 140 MG/ML Soaj Commonly known as:  REPATHA SURECLICK Inject 1 pen into the skin every 14 (fourteen) days.   insulin lispro 100 UNIT/ML KiwkPen Commonly known as:  HUMALOG KWIKPEN 3 times a day (just before each meal) 15-10-15, and pen needles 4/day What changed:  how much to  take  how to take this  when to take this  additional instructions   metoprolol succinate 50 MG 24 hr tablet Commonly known as:  TOPROL-XL Take 1 tablet (50 mg total) by mouth 2 (two) times daily. Take with or immediately following a meal.   montelukast 10 MG tablet Commonly known as:  SINGULAIR Take 10 mg by mouth daily.   nitroGLYCERIN 0.4 MG SL tablet Commonly known as:  NITROSTAT Place 0.4 mg under the tongue as needed. Place 1 tablet under your tongue every 5 minutes as needed for chest pain for a max of 3 doses   ondansetron 4 MG disintegrating tablet Commonly known as:  ZOFRAN-ODT Take 4 mg by mouth every 8 (eight) hours as needed for nausea/vomiting.   timolol 0.5 % ophthalmic solution Commonly known as:  TIMOPTIC Place 1 drop into both eyes 2 (two) times daily.   travoprost (benzalkonium) 0.004 % ophthalmic solution Commonly known as:  TRAVATAN Place 1 drop into both eyes at bedtime.       Followup:  Follow-up Information    [name], MD Follow up.   Specialty:  Urology Contact information: [place] [address] [phone]

## 2017-03-13 NOTE — Discharge Instructions (Signed)
I have reviewed discharge instructions in detail with the patient. They will follow-up with me or their physician as scheduled. My nurse will also be calling the patients as per protocol.   

## 2017-03-13 NOTE — Progress Notes (Signed)
Looks good No pain Cr starting to decrease Home withf/up discussed

## 2017-03-14 ENCOUNTER — Encounter (HOSPITAL_COMMUNITY): Payer: Self-pay | Admitting: *Deleted

## 2017-03-18 ENCOUNTER — Ambulatory Visit: Payer: Medicare Other

## 2017-03-18 NOTE — Patient Instructions (Addendum)
Jeffrey Holmes  03/18/2017   Your procedure is scheduled on: Tuesday 03-25-17  Report to [facility] Main  Entrance Take [place]  elevators to 3rd floor to  [place] at 530  AM.   Call this number if you have problems the morning of surgery [phone]   Remember: ONLY 1 PERSON MAY GO WITH YOU TO SHORT STAY TO GET  READY MORNING OF [place].  Do not eat food or drink liquids :After Midnight.  FOLLOW DR WRENN  INSTRUCTIONS ABOUT STOPPING PLAVIX CELL DR [facility] ABOUT PLAVIX PHONE NUMBER [phone] EXT 51 SCHEDULER PAM    Take these medicines the morning of surgery with A SIP OF WATER: AMLODIPINE (NORVASC), SINGULAIR, TIMOLOL EYE DROP, METOPROLOL SUCCINATE (TOPROLOL-XL), ATORVASTASTIN DO NOT TAKE ANY DIABETIC MEDICATIONS DAY OF YOUR SURGERY                               You may not have any metal on your body including hair pins and              piercings  Do not wear jewelry, make-up, lotions, powders or perfumes, deodorant             Do not wear nail polish.  Do not shave  48 hours prior to surgery.              Men may shave face and neck.   Do not bring valuables to the hospital. [place].  Contacts, dentures or bridgework may not be worn into surgery.  Leave suitcase in the car. After surgery it may be brought to your room.     Patients discharged the day of surgery will not be allowed to drive home.  Name and phone number of your driver:WIFE FELICIA Kell CELL [phone]  Special Instructions: N/A              Please read over the following fact sheets you were given: _____________________________________________________________________             How to Manage Your Diabetes Before and After Surgery  Why is it important to control my blood sugar before and after surgery? . Improving blood sugar levels before and after surgery helps healing and can limit problems. . A way of improving blood  sugar control is eating a healthy diet by: o  Eating less sugar and carbohydrates o  Increasing activity/exercise o  Talking with your doctor about reaching your blood sugar goals . High blood sugars (greater than 180 mg/dL) can raise your risk of infections and slow your recovery, so you will need to focus on controlling your diabetes during the weeks before surgery. . Make sure that the doctor who takes care of your diabetes knows about your planned surgery including the date and location.  How do I manage my blood sugar before surgery? . Check your blood sugar at least 4 times a day, starting 2 days before surgery, to make sure that the level is not too high or low. o Check your blood sugar the morning of your surgery when you wake up and every 2 hours until you get to the Short Stay unit. . If your blood sugar is less than 70 mg/dL, you will  need to treat for low blood sugar: o Do not take insulin. o Treat a low blood sugar (less than 70 mg/dL) with  cup of clear juice (cranberry or apple), 4 glucose tablets, OR glucose gel. o Recheck blood sugar in 15 minutes after treatment (to make sure it is greater than 70 mg/dL). If your blood sugar is not greater than 70 mg/dL on recheck, call [phone] for further instructions. . Report your blood sugar to the short stay nurse when you get to Short Stay.  . If you are admitted to the hospital after surgery: o Your blood sugar will be checked by the staff and you will probably be given insulin after surgery (instead of oral diabetes medicines) to make sure you have good blood sugar levels. o The goal for blood sugar control after surgery is 80-180 mg/dL.   WHAT DO I DO ABOUT MY DIABETES MEDICATION?  [name] Do not take oral diabetes medicines (pills) the morning of surgery.  . THE NIGHT BEFORE SURGERY ON 03/24/17, take 10    units of GLARGINE       insulin.        . The day of surgery, do not take other diabetes injectables, including Byetta  (exenatide), Bydureon (exenatide ER), Victoza (liraglutide), or Trulicity (dulaglutide).  . If your CBG is greater than 220 mg/dL, you may take  of your sliding scale  . (correction) dose of insulin.    Patient Signature:  Date:   Nurse Signature:  Date:   Reviewed and Endorsed by [facility] Patient Education Committee, August 2015[facility] - Preparing for Surgery Before surgery, you can play an important role.  Because skin is not sterile, your skin needs to be as free of germs as possible.  You can reduce the number of germs on your skin by washing with CHG (chlorahexidine gluconate) soap before surgery.  CHG is an antiseptic cleaner which kills germs and bonds with the skin to continue killing germs even after washing. Please DO NOT use if you have an allergy to CHG or antibacterial soaps.  If your skin becomes reddened/irritated stop using the CHG and inform your nurse when you arrive at Short Stay. Do not shave (including legs and underarms) for at least 48 hours prior to the first CHG shower.  You may shave your face/neck. Please follow these instructions carefully:  1.  Shower with CHG Soap the night before surgery and the  morning of Surgery.  2.  If you choose to wash your hair, wash your hair first as usual with your  normal  shampoo.  3.  After you shampoo, rinse your hair and body thoroughly to remove the  shampoo.                           4.  Use CHG as you would any other liquid soap.  You can apply chg directly  to the skin and wash                       Gently with a scrungie or clean washcloth.  5.  Apply the CHG Soap to your body ONLY FROM THE NECK DOWN.   Do not use on face/ open                           Wound or open sores. Avoid contact with eyes, ears mouth and genitals (private  parts).                       Wash face,  Genitals (private parts) with your normal soap.             6.  Wash thoroughly, paying special attention to the area where your surgery  will be  performed.  7.  Thoroughly rinse your body with warm water from the neck down.  8.  DO NOT shower/wash with your normal soap after using and rinsing off  the CHG Soap.                9.  Pat yourself dry with a clean towel.            10.  Wear clean pajamas.            11.  Place clean sheets on your bed the night of your first shower and do not  sleep with pets. Day of Surgery : Do not apply any lotions/deodorants the morning of surgery.  Please wear clean clothes to the hospital/surgery center.  _________________________________________

## 2017-03-18 NOTE — Progress Notes (Signed)
EKG 03-12-14 EPIC  CARDIO LOV DR TURNER 12-02-16 EPIC CBC 03-12-17 EPIC PULMONART FUNCTION TEST 08-18-15 EPIC  CARDIAC CATH 08-21-15 EPIC ECHO 08-17-15 EPIC

## 2017-03-19 ENCOUNTER — Encounter (HOSPITAL_COMMUNITY)
Admission: RE | Admit: 2017-03-19 | Discharge: 2017-03-19 | Disposition: A | Discharge status: Home / Self Care | Payer: Medicare Other | Source: Ambulatory Visit | Attending: Urology | Admitting: Urology

## 2017-03-19 ENCOUNTER — Encounter (HOSPITAL_COMMUNITY): Payer: Self-pay

## 2017-03-19 DIAGNOSIS — N201 Calculus of ureter: Secondary | ICD-10-CM | POA: Insufficient documentation

## 2017-03-19 DIAGNOSIS — Z01812 Encounter for preprocedural laboratory examination: Secondary | ICD-10-CM | POA: Diagnosis not present

## 2017-03-19 HISTORY — DX: Personal history of urinary calculi: Z87.442

## 2017-03-19 HISTORY — DX: Gastro-esophageal reflux disease without esophagitis: K21.9

## 2017-03-19 LAB — COMPREHENSIVE METABOLIC PANEL
ALT: 16 U/L — ABNORMAL LOW (ref 17–63)
ANION GAP: 8 (ref 5–15)
AST: 15 U/L (ref 15–41)
Albumin: 3.9 g/dL (ref 3.5–5.0)
Alkaline Phosphatase: 68 U/L (ref 38–126)
BILIRUBIN TOTAL: 0.6 mg/dL (ref 0.3–1.2)
BUN: 33 mg/dL — AB (ref 6–20)
CHLORIDE: 112 mmol/L — AB (ref 101–111)
CO2: 24 mmol/L (ref 22–32)
Calcium: 8.3 mg/dL — ABNORMAL LOW (ref 8.9–10.3)
Creatinine, Ser: 2.97 mg/dL — ABNORMAL HIGH (ref 0.61–1.24)
GFR, EST AFRICAN AMERICAN: 24 mL/min — AB (ref >60)
GFR, EST NON AFRICAN AMERICAN: 20 mL/min — AB (ref >60)
Glucose, Bld: 124 mg/dL — ABNORMAL HIGH (ref 65–99)
POTASSIUM: 4.2 mmol/L (ref 3.5–5.1)
Sodium: 144 mmol/L (ref 135–145)
TOTAL PROTEIN: 7.4 g/dL (ref 6.5–8.1)

## 2017-03-19 LAB — HEMOGLOBIN A1C
HEMOGLOBIN A1C: 6.4 % — AB (ref 4.8–5.6)
MEAN PLASMA GLUCOSE: 136.98 mg/dL

## 2017-03-19 LAB — GLUCOSE, CAPILLARY: GLUCOSE-CAPILLARY: 142 mg/dL — AB (ref 65–99)

## 2017-03-19 NOTE — Progress Notes (Signed)
cmet results faxed to dr Jeffie Pollock office and dr wreen pod  by epic

## 2017-03-25 ENCOUNTER — Ambulatory Visit (HOSPITAL_COMMUNITY): Payer: Medicare Other | Admitting: Certified Registered Nurse Anesthetist

## 2017-03-25 ENCOUNTER — Ambulatory Visit (HOSPITAL_COMMUNITY): Payer: Medicare Other

## 2017-03-25 ENCOUNTER — Encounter (HOSPITAL_COMMUNITY): Payer: Self-pay | Admitting: *Deleted

## 2017-03-25 ENCOUNTER — Encounter (HOSPITAL_COMMUNITY)
Admission: RE | Disposition: A | Discharge status: Home / Self Care | Payer: Self-pay | Source: Ambulatory Visit | Attending: Urology

## 2017-03-25 ENCOUNTER — Ambulatory Visit (HOSPITAL_COMMUNITY)
Admission: RE | Admit: 2017-03-25 | Discharge: 2017-03-25 | Disposition: A | Discharge status: Home / Self Care | Payer: Medicare Other | Source: Ambulatory Visit | Attending: Urology | Admitting: Urology

## 2017-03-25 ENCOUNTER — Ambulatory Visit: Payer: Medicare Other

## 2017-03-25 DIAGNOSIS — Z885 Allergy status to narcotic agent status: Secondary | ICD-10-CM | POA: Diagnosis not present

## 2017-03-25 DIAGNOSIS — I2510 Atherosclerotic heart disease of native coronary artery without angina pectoris: Secondary | ICD-10-CM | POA: Insufficient documentation

## 2017-03-25 DIAGNOSIS — K760 Fatty (change of) liver, not elsewhere classified: Secondary | ICD-10-CM | POA: Insufficient documentation

## 2017-03-25 DIAGNOSIS — Z955 Presence of coronary angioplasty implant and graft: Secondary | ICD-10-CM | POA: Insufficient documentation

## 2017-03-25 DIAGNOSIS — Z87442 Personal history of urinary calculi: Secondary | ICD-10-CM | POA: Insufficient documentation

## 2017-03-25 DIAGNOSIS — N201 Calculus of ureter: Secondary | ICD-10-CM | POA: Diagnosis not present

## 2017-03-25 DIAGNOSIS — D734 Cyst of spleen: Secondary | ICD-10-CM | POA: Diagnosis not present

## 2017-03-25 DIAGNOSIS — N182 Chronic kidney disease, stage 2 (mild): Secondary | ICD-10-CM | POA: Diagnosis not present

## 2017-03-25 DIAGNOSIS — N202 Calculus of kidney with calculus of ureter: Secondary | ICD-10-CM | POA: Diagnosis not present

## 2017-03-25 DIAGNOSIS — Z7902 Long term (current) use of antithrombotics/antiplatelets: Secondary | ICD-10-CM | POA: Diagnosis not present

## 2017-03-25 DIAGNOSIS — E1122 Type 2 diabetes mellitus with diabetic chronic kidney disease: Secondary | ICD-10-CM | POA: Diagnosis not present

## 2017-03-25 DIAGNOSIS — K7689 Other specified diseases of liver: Secondary | ICD-10-CM | POA: Diagnosis not present

## 2017-03-25 DIAGNOSIS — Z951 Presence of aortocoronary bypass graft: Secondary | ICD-10-CM | POA: Insufficient documentation

## 2017-03-25 DIAGNOSIS — Z794 Long term (current) use of insulin: Secondary | ICD-10-CM | POA: Diagnosis not present

## 2017-03-25 DIAGNOSIS — E785 Hyperlipidemia, unspecified: Secondary | ICD-10-CM | POA: Diagnosis not present

## 2017-03-25 DIAGNOSIS — I129 Hypertensive chronic kidney disease with stage 1 through stage 4 chronic kidney disease, or unspecified chronic kidney disease: Secondary | ICD-10-CM | POA: Diagnosis not present

## 2017-03-25 DIAGNOSIS — N183 Chronic kidney disease, stage 3 (moderate): Secondary | ICD-10-CM | POA: Diagnosis not present

## 2017-03-25 HISTORY — PX: CYSTOSCOPY/URETEROSCOPY/HOLMIUM LASER/STENT PLACEMENT: SHX6546

## 2017-03-25 LAB — GLUCOSE, CAPILLARY
Glucose-Capillary: 114 mg/dL — ABNORMAL HIGH (ref 65–99)
Glucose-Capillary: 121 mg/dL — ABNORMAL HIGH (ref 65–99)

## 2017-03-25 SURGERY — CYSTOSCOPY/URETEROSCOPY/HOLMIUM LASER/STENT PLACEMENT
Anesthesia: General | Laterality: Right

## 2017-03-25 MED ORDER — MIDAZOLAM HCL 2 MG/2ML IJ SOLN
INTRAMUSCULAR | Status: AC
  Filled 2017-03-25: qty 2

## 2017-03-25 MED ORDER — NALOXONE HCL 0.4 MG/ML IJ SOLN
INTRAMUSCULAR | Status: DC | PRN
  Administered 2017-03-25 (×2): 40 ug via INTRAVENOUS

## 2017-03-25 MED ORDER — TRAMADOL HCL 50 MG PO TABS: 50.0000 mg | ORAL_TABLET | Freq: Four times a day (QID) | ORAL | 0 refills | Status: DC | PRN

## 2017-03-25 MED ORDER — ONDANSETRON HCL 4 MG/2ML IJ SOLN
INTRAMUSCULAR | Status: DC | PRN
  Administered 2017-03-25: 4 mg via INTRAVENOUS

## 2017-03-25 MED ORDER — NALOXONE HCL 0.4 MG/ML IJ SOLN
INTRAMUSCULAR | Status: AC
  Filled 2017-03-25: qty 1

## 2017-03-25 MED ORDER — FENTANYL CITRATE (PF) 100 MCG/2ML IJ SOLN
INTRAMUSCULAR | Status: AC
  Filled 2017-03-25: qty 2

## 2017-03-25 MED ORDER — EPHEDRINE 5 MG/ML INJ
INTRAVENOUS | Status: AC
  Filled 2017-03-25: qty 10

## 2017-03-25 MED ORDER — CEFAZOLIN SODIUM-DEXTROSE 2-4 GM/100ML-% IV SOLN
2.0000 g | INTRAVENOUS | Status: AC
  Administered 2017-03-25: 2 g via INTRAVENOUS

## 2017-03-25 MED ORDER — MIDAZOLAM HCL 5 MG/5ML IJ SOLN
INTRAMUSCULAR | Status: DC | PRN
  Administered 2017-03-25: 2 mg via INTRAVENOUS

## 2017-03-25 MED ORDER — PROPOFOL 10 MG/ML IV BOLUS
INTRAVENOUS | Status: DC | PRN
  Administered 2017-03-25: 150 mg via INTRAVENOUS

## 2017-03-25 MED ORDER — LACTATED RINGERS IV SOLN
INTRAVENOUS | Status: DC | PRN
  Administered 2017-03-25 (×2): via INTRAVENOUS

## 2017-03-25 MED ORDER — ONDANSETRON HCL 4 MG/2ML IJ SOLN
INTRAMUSCULAR | Status: AC
  Filled 2017-03-25: qty 2

## 2017-03-25 MED ORDER — CEFAZOLIN SODIUM-DEXTROSE 2-4 GM/100ML-% IV SOLN
INTRAVENOUS | Status: AC
  Filled 2017-03-25: qty 100

## 2017-03-25 MED ORDER — CEPHALEXIN 250 MG PO CAPS: 250.0000 mg | ORAL_CAPSULE | Freq: Three times a day (TID) | ORAL | 0 refills | Status: DC

## 2017-03-25 MED ORDER — EPHEDRINE SULFATE 50 MG/ML IJ SOLN
INTRAMUSCULAR | Status: DC | PRN
  Administered 2017-03-25 (×3): 5 mg via INTRAVENOUS
  Administered 2017-03-25 (×2): 10 mg via INTRAVENOUS

## 2017-03-25 MED ORDER — HYDROMORPHONE HCL 2 MG/ML IJ SOLN
INTRAMUSCULAR | Status: AC
  Filled 2017-03-25: qty 1

## 2017-03-25 MED ORDER — PROPOFOL 10 MG/ML IV BOLUS
INTRAVENOUS | Status: AC
  Filled 2017-03-25: qty 20

## 2017-03-25 MED ORDER — LIDOCAINE HCL 1 % IJ SOLN
INTRAMUSCULAR | Status: DC | PRN
  Administered 2017-03-25: 60 mg via INTRADERMAL

## 2017-03-25 MED ORDER — LIDOCAINE 2% (20 MG/ML) 5 ML SYRINGE
INTRAMUSCULAR | Status: AC
  Filled 2017-03-25: qty 5

## 2017-03-25 MED ORDER — HYDROMORPHONE HCL-NACL 0.5-0.9 MG/ML-% IV SOSY: 0.2500 mg | PREFILLED_SYRINGE | INTRAVENOUS | Status: DC | PRN

## 2017-03-25 MED ORDER — PROMETHAZINE HCL 25 MG/ML IJ SOLN: 6.2500 mg | INTRAMUSCULAR | Status: DC | PRN

## 2017-03-25 MED ORDER — SODIUM CHLORIDE 0.9 % IR SOLN
Status: DC | PRN
  Administered 2017-03-25: 5000 mL

## 2017-03-25 MED ORDER — DEXAMETHASONE SODIUM PHOSPHATE 10 MG/ML IJ SOLN
INTRAMUSCULAR | Status: AC
  Filled 2017-03-25: qty 1

## 2017-03-25 MED ORDER — DEXAMETHASONE SODIUM PHOSPHATE 10 MG/ML IJ SOLN
INTRAMUSCULAR | Status: DC | PRN
  Administered 2017-03-25: 10 mg via INTRAVENOUS

## 2017-03-25 MED ORDER — HYDROMORPHONE HCL 1 MG/ML IJ SOLN
INTRAMUSCULAR | Status: DC | PRN
  Administered 2017-03-25: 1 mg via INTRAVENOUS

## 2017-03-25 MED ORDER — FENTANYL CITRATE (PF) 100 MCG/2ML IJ SOLN
INTRAMUSCULAR | Status: DC | PRN
  Administered 2017-03-25: 50 ug via INTRAVENOUS
  Administered 2017-03-25: 25 ug via INTRAVENOUS
  Administered 2017-03-25: 50 ug via INTRAVENOUS
  Administered 2017-03-25: 25 ug via INTRAVENOUS
  Administered 2017-03-25: 50 ug via INTRAVENOUS
  Administered 2017-03-25: 25 ug via INTRAVENOUS
  Administered 2017-03-25: 50 ug via INTRAVENOUS
  Administered 2017-03-25: 25 ug via INTRAVENOUS

## 2017-03-25 SURGICAL SUPPLY — 24 items
BAG URO CATCHER STRL LF (MISCELLANEOUS) ×3 IMPLANT
BASKET STONE NCOMPASS (UROLOGICAL SUPPLIES) IMPLANT
CATH URET 5FR 28IN OPEN ENDED (CATHETERS) ×2 IMPLANT
CATH URET DUAL LUMEN 6-10FR 50 (CATHETERS) ×1 IMPLANT
CLOTH BEACON ORANGE TIMEOUT ST (SAFETY) ×3 IMPLANT
COVER FOOTSWITCH UNIV (MISCELLANEOUS) ×2 IMPLANT
COVER SURGICAL LIGHT HANDLE (MISCELLANEOUS) ×3 IMPLANT
EXTRACTOR STONE NITINOL NGAGE (UROLOGICAL SUPPLIES) ×5 IMPLANT
FIBER LASER FLEXIVA 1000 (UROLOGICAL SUPPLIES) IMPLANT
FIBER LASER FLEXIVA 365 (UROLOGICAL SUPPLIES) IMPLANT
FIBER LASER FLEXIVA 550 (UROLOGICAL SUPPLIES) IMPLANT
FIBER LASER TRAC TIP (UROLOGICAL SUPPLIES) ×2 IMPLANT
GLOVE SURG SS PI 8.0 STRL IVOR (GLOVE) ×2 IMPLANT
GOWN STRL REUS W/TWL XL LVL3 (GOWN DISPOSABLE) ×3 IMPLANT
GUIDEWIRE STR DUAL SENSOR (WIRE) ×5 IMPLANT
IV NS 1000ML (IV SOLUTION)
IV NS 1000ML BAXH (IV SOLUTION) ×1 IMPLANT
IV NS IRRIG 3000ML ARTHROMATIC (IV SOLUTION) ×1 IMPLANT
MANIFOLD NEPTUNE II (INSTRUMENTS) ×3 IMPLANT
PACK CYSTO (CUSTOM PROCEDURE TRAY) ×3 IMPLANT
SHEATH ACCESS URETERAL 38CM (SHEATH) ×3 IMPLANT
STENT CONTOUR 6FRX26X.038 (STENTS) ×2 IMPLANT
TUBING CONNECTING 10 (TUBING) ×2 IMPLANT
TUBING CONNECTING 10' (TUBING) ×1

## 2017-03-25 NOTE — Anesthesia Preprocedure Evaluation (Signed)
Anesthesia Evaluation  Patient identified by MRN, date of birth, ID band Patient awake    Reviewed: Allergy & Precautions, NPO status , Patient's Chart, lab work & pertinent test results, reviewed documented beta blocker date and time   Airway Mallampati: III  TM Distance: >3 FB Neck ROM: Full    Dental  (+) Dental Advisory Given   Pulmonary neg pulmonary ROS,    breath sounds clear to auscultation       Cardiovascular hypertension, Pt. on medications + CAD, + Cardiac Stents and + CABG   Rhythm:Regular Rate:Normal     Neuro/Psych negative neurological ROS     GI/Hepatic Neg liver ROS, hiatal hernia,   Endo/Other  diabetes, Type 2, Insulin Dependent  Renal/GU CRFRenal disease     Musculoskeletal   Abdominal   Peds  Hematology negative hematology ROS (+)   Anesthesia Other Findings   Reproductive/Obstetrics                             Lab Results  Component Value Date   WBC 11.3 (H) 03/12/2017   HGB 16.2 03/12/2017   HCT 46.3 03/12/2017   MCV 88.4 03/12/2017   PLT 239 03/12/2017   Lab Results  Component Value Date   CREATININE 2.97 (H) 03/19/2017   BUN 33 (H) 03/19/2017   NA 144 03/19/2017   K 4.2 03/19/2017   CL 112 (H) 03/19/2017   CO2 24 03/19/2017    Anesthesia Physical  Anesthesia Plan  ASA: III  Anesthesia Plan: General   Post-op Pain Management:    Induction: Intravenous  PONV Risk Score and Plan: 2 and Ondansetron and Dexamethasone  Airway Management Planned: LMA  Additional Equipment:   Intra-op Plan:   Post-operative Plan: Extubation in OR  Informed Consent: I have reviewed the patients History and Physical, chart, labs and discussed the procedure including the risks, benefits and alternatives for the proposed anesthesia with the patient or authorized representative who has indicated his/her understanding and acceptance.   Dental advisory  given  Plan Discussed with: CRNA  Anesthesia Plan Comments:         Anesthesia Quick Evaluation

## 2017-03-25 NOTE — H&P (View-Only) (Signed)
Chief Complaint: Ureter stone and renal failure  History of Present Illness: Intermittent right flank pain and nausea over night; no fever; feels otherwise OK and has not eaten since yesterday Cardiac surgery 18 months ago on plavix Modifying factors: There are no other modifying factors  Associated signs and symptoms: There are no other associated signs and symptoms Aggravating and relieving factors: There are no other aggravating or relieving factors Severity: Moderate Duration: Persistent  Past Medical History:  Diagnosis Date  . Allergy   . Benign essential HTN 08/08/2015  . CAD (coronary artery disease), native coronary artery 08/28/2015   Severe multivessel ASCAD s/p CABG hybrid with LIMA to LAD and diag and PCI of the RCA.   [name] Chronic back pain    L5-S1  . Chronic kidney disease, stage 3    multiple kidney stones  . Diabetes mellitus without complication ([place])   . H/O hiatal hernia   . Hepatic cyst 11/01/2016  . History of acute renal failure 2015   secondary to stones  . Hyperlipidemia 08/08/2015  . NAFLD (nonalcoholic fatty liver disease) 11/01/2016  . Splenic cyst 11/01/2016  . Ureteral calculus 01/06/2014   Past Surgical History:  Procedure Laterality Date  . ANGIOPLASTY N/A 08/21/2015   Procedure: ANGIOPLASTY WITH PERCUTANEOUS CORONARY INTERVENTION WITH DES TO RIGHT CORONARY ARTERY;  Surgeon: Jettie Booze, MD;  Location: [place];  Service: Cardiovascular;  Laterality: N/A;  . BACK SURGERY  2006   microdisectomy L5-S1  . CARDIAC CATHETERIZATION N/A 08/18/2015   Procedure: Left Heart Cath and Coronary Angiography;  Surgeon: Jettie Booze, MD;  Location: [place] [facility];  Service: Cardiovascular;  Laterality: N/A;  . CARDIAC CATHETERIZATION N/A 08/21/2015   Procedure: Coronary Stent Intervention;  Surgeon: Jettie Booze, MD;  Location: [place] CV LAB;  Service: Cardiovascular;  Laterality: N/A;  . CARDIAC CATHETERIZATION  08/21/2015   Procedure: Bypass  Graft Angiography;  Surgeon: Jettie Booze, MD;  Location: [place] CV LAB;  Service: Cardiovascular;;  . CARDIOVASCULAR STRESS TEST  08/15/2015  . COLONOSCOPY    . CORONARY ARTERY BYPASS GRAFT N/A 08/21/2015   Procedure: OFF PUMP CORONARY ARTERY BYPASS GRAFTING (CABG), TIMES TWO, USING LEFT INTERNAL MAMMARY ARTERY, RIGHT GREATER SAPHENOUS VEIN HARVESTED ENDOSCOPICALLY;  Surgeon: [name], MD;  Location: [place];  Service: Open Heart Surgery;  Laterality: N/A;  . CYSTOSCOPY WITH RETROGRADE PYELOGRAM, URETEROSCOPY AND STENT PLACEMENT Right 01/06/2014   Procedure: CYSTOSCOPY WITH RETROGRADE PYELOGRAM, URETEROSCOPY AND STENT PLACEMENT;  Surgeon: Bernestine Amass, MD;  Location: [facility];  Service: Urology;  Laterality: Right;  . ESOPHAGOGASTRODUODENOSCOPY ENDOSCOPY     normal  . LITHOTRIPSY     2-3 times in past  . TEE WITHOUT CARDIOVERSION N/A 08/21/2015   Procedure: TRANSESOPHAGEAL ECHOCARDIOGRAM (TEE);  Surgeon: [name], MD;  Location: [place];  Service: Open Heart Surgery;  Laterality: N/A;  . TONSILLECTOMY    . WISDOM TOOTH EXTRACTION      Medications: I have reviewed the patient's current medications. Allergies:  Allergies  Allergen Reactions  . Vicodin [Hydrocodone-Acetaminophen] Nausea And Vomiting  . Hydrocodone Nausea And Vomiting  . Oxycontin [Oxycodone Hcl] Nausea And Vomiting  . Percocet [Oxycodone-Acetaminophen] Nausea And Vomiting    Family History  Problem Relation Age of Onset  . Kidney failure Mother   . Heart attack Mother        angina   . Diabetes Mother   . Throat cancer Father   . Diabetes Sister   .  Colon cancer Neg Hx   . Colon polyps Neg Hx   . Rectal cancer Neg Hx   . Stomach cancer Neg Hx    Social History:  reports that he has never smoked. He has never used smokeless tobacco. He reports that he drinks alcohol. He reports that he does not use drugs.  ROS: All systems are reviewed and negative except as noted. Rest  negative  Physical Exam:  Vital signs in last 24 hours: Temp:  [97.4 F (36.3 C)] 97.4 F (36.3 C) (08/15 0849) Pulse Rate:  [65] 65 (08/15 0849) Resp:  [15] 15 (08/15 0849) BP: (167)/(84) 167/84 (08/15 0849) SpO2:  [96 %] 96 % (08/15 0849)  Cardiovascular: Skin warm; not flushed Respiratory: Breaths quiet; no shortness of breath Abdomen: No masses Neurological: Normal sensation to touch Musculoskeletal: Normal motor function arms and legs Lymphatics: No inguinal adenopathy Skin: No rashes Genitourinary:non toxic and no sepsis  Laboratory Data:  No results found for this or any previous visit (from the past 72 hour(s)). No results found for this or any previous visit (from the past 240 hour(s)). Creatinine: No results for input(s): CREATININE in the last 168 hours.  Xrays: See report/chart reviewed  Impression/Assessment:  Large right ureter stone and Cr 8 (baseline for similar few years ago was 3 ?);   Plan:  Pros and cons and risk and sequelae of stent note; picture drawn; bit concerned re not getting stent in based on size- mentioned need for perc tube Keep NPO Check labs and EKG Keep in overnight for labs observation

## 2017-03-25 NOTE — Transfer of Care (Signed)
Immediate Anesthesia Transfer of Care Note  Patient: Jeffrey Holmes  Procedure(s) Performed: Procedure(s): RIGHT URETEROSCOPY WITH HOLMIUM LASER STENT EXCHANGE (Right)  Patient Location: PACU  Anesthesia Type:General  Level of Consciousness: awake, alert  and sedated  Airway & Oxygen Therapy: Patient Spontanous Breathing and Patient connected to face mask oxygen  Post-op Assessment: Report given to RN and Post -op Vital signs reviewed and stable  Post vital signs: Reviewed  Last Vitals:  Vitals:   03/25/17 0537  BP: 129/78  Pulse: 73  Resp: 18  Temp: 37.1 C  SpO2: 99%    Last Pain:  Vitals:   03/25/17 0537  TempSrc: Oral      Patients Stated Pain Goal: 4 (43/56/86 [id number])  Complications: No apparent anesthesia complications

## 2017-03-25 NOTE — Discharge Instructions (Addendum)
General Anesthesia, Adult, Care After These instructions provide you with information about caring for yourself after your procedure. Your health care provider may also give you more specific instructions. Your treatment has been planned according to current medical practices, but problems sometimes occur. Call your health care provider if you have any problems or questions after your procedure. What can I expect after the procedure? After the procedure, it is common to have:  Vomiting.  A sore throat.  Mental slowness.  It is common to feel:  Nauseous.  Cold or shivery.  Sleepy.  Tired.  Sore or achy, even in parts of your body where you did not have surgery.  Follow these instructions at home: For at least 24 hours after the procedure:  Do not: ? Participate in activities where you could fall or become injured. ? Drive. ? Use heavy machinery. ? Drink alcohol. ? Take sleeping pills or medicines that cause drowsiness. ? Make important decisions or sign legal documents. ? Take care of children on your own.  Rest. Eating and drinking  If you vomit, drink water, juice, or soup when you can drink without vomiting.  Drink enough fluid to keep your urine clear or pale yellow.  Make sure you have little or no nausea before eating solid foods.  Follow the diet recommended by your health care provider. General instructions  Have a responsible adult stay with you until you are awake and alert.  Return to your normal activities as told by your health care provider. Ask your health care provider what activities are safe for you.  Take over-the-counter and prescription medicines only as told by your health care provider.  If you smoke, do not smoke without supervision.  Keep all follow-up visits as told by your health care provider. This is important. Contact a health care provider if:  You continue to have nausea or vomiting at home, and medicines are not helpful.  You  cannot drink fluids or start eating again.  You cannot urinate after 8-12 hours.  You develop a skin rash.  You have fever.  You have increasing redness at the site of your procedure. Get help right away if:  You have difficulty breathing.  You have chest pain.  You have unexpected bleeding.  You feel that you are having a life-threatening or urgent problem. This information is not intended to replace advice given to you by your health care provider. Make sure you discuss any questions you have with your health care provider. Document Released: 10/21/2000 Document Revised: 12/18/2015 Document Reviewed: 06/29/2015 Elsevier Interactive Patient Education  2018 [place]. Ureteral Stent Implantation, Care After Refer to this sheet in the next few weeks. These instructions provide you with information about caring for yourself after your procedure. Your health care provider may also give you more specific instructions. Your treatment has been planned according to current medical practices, but problems sometimes occur. Call your health care provider if you have any problems or questions after your procedure. What can I expect after the procedure? After the procedure, it is common to have:  Nausea.  Mild pain when you urinate. You may feel this pain in your lower back or lower abdomen. Pain should stop within a few minutes after you urinate. This may last for up to 1 week.  A small amount of blood in your urine for several days.  Follow these instructions at home:  Medicines  Take over-the-counter and prescription medicines only as told by your health care provider.  If you were prescribed an antibiotic medicine, take it as told by your health care provider. Do not stop taking the antibiotic even if you start to feel better.  Do not drive for 24 hours if you received a sedative.  Do not drive or operate heavy machinery while taking prescription pain  medicines. Activity  Return to your normal activities as told by your health care provider. Ask your health care provider what activities are safe for you.  Do not lift anything that is heavier than 10 lb (4.5 kg). Follow this limit for 1 week after your procedure, or for as long as told by your health care provider. General instructions  Watch for any blood in your urine. Call your health care provider if the amount of blood in your urine increases.  If you have a catheter: ? Follow instructions from your health care provider about taking care of your catheter and collection bag. ? Do not take baths, swim, or use a hot tub until your health care provider approves.  Drink enough fluid to keep your urine clear or pale yellow.  Keep all follow-up visits as told by your health care provider. This is important. Contact a health care provider if:  You have pain that gets worse or does not get better with medicine, especially pain when you urinate.  You have difficulty urinating.  You feel nauseous or you vomit repeatedly during a period of more than 2 days after the procedure. Get help right away if:  Your urine is dark red or has blood clots in it.  You are leaking urine (have incontinence).  The end of the stent comes out of your urethra.  You cannot urinate.  You have sudden, sharp, or severe pain in your abdomen or lower back.  You have a fever.  You may remove your stent on Friday morning by pulling the string.  If you don't feel comfortable doing that, you can come to the office Friday.  Please call the office in the morning if you need to come in.   This information is not intended to replace advice given to you by your health care provider. Make sure you discuss any questions you have with your health care provider. Document Released: 03/17/2013 Document Revised: 12/21/2015 Document Reviewed: 01/27/2015 Elsevier Interactive Patient Education  [employer].

## 2017-03-25 NOTE — Interval H&P Note (Signed)
History and Physical Interval Note:  He has a right ureteral stent and will have right ureteroscopic stone management today.   03/25/2017 7:23 AM  Jeffrey Holmes  has presented today for surgery, with the diagnosis of RIGHT PROXIMAL STONE  The various methods of treatment have been discussed with the patient and family. After consideration of risks, benefits and other options for treatment, the patient has consented to  Procedure(s): RIGHT URETEROSCOPY WITH HOLMIUM LASER STENT EXCHANGE (Right) as a surgical intervention .  The patient's history has been reviewed, patient examined, no change in status, stable for surgery.  I have reviewed the patient's chart and labs.  Questions were answered to the patient's satisfaction.     [name] J

## 2017-03-25 NOTE — Brief Op Note (Signed)
03/25/2017  8:46 AM  PATIENT:  Jeffrey Holmes  66 y.o. male  PRE-OPERATIVE DIAGNOSIS:  RIGHT PROXIMAL STONE  POST-OPERATIVE DIAGNOSIS:  RIGHT PROXIMAL STONE  PROCEDURE:  Procedure(s): RIGHT URETEROSCOPY WITH HOLMIUM LASER STENT EXCHANGE (Right)  SURGEON:  Surgeon(s) and Role:    * [name], MD - Primary  PHYSICIAN ASSISTANT:   ASSISTANTS: none   ANESTHESIA:   general  EBL:  Total I/O In: 1000 [I.V.:1000] Out: -   BLOOD ADMINISTERED:none  DRAINS: 6 x 26 right JJ stent   LOCAL MEDICATIONS USED:  NONE  SPECIMEN:  Source of Specimen:  stone fragments   DISPOSITION OF SPECIMEN:  to family  COUNTS:  YES  TOURNIQUET:  * No tourniquets in log *  DICTATION: .Other Dictation: Dictation Number [id number]  PLAN OF CARE: Discharge to home after PACU  PATIENT DISPOSITION:  PACU - hemodynamically stable.   Delay start of Pharmacological VTE agent (>24hrs) due to surgical blood loss or risk of bleeding: not applicable

## 2017-03-25 NOTE — Op Note (Signed)
NAMECHESTER, Jeffrey NO.:  [id number]  MEDICAL RECORD NO.:  [id number]  LOCATION:  [facility]                         FACILITY:  [facility]  PHYSICIAN:  Marshall Cork. Jeffie Pollock, M.D.    DATE OF BIRTH:  [birth year]  DATE OF PROCEDURE:  03/25/2017 DATE OF DISCHARGE:                              OPERATIVE REPORT   PROCEDURE: 1. Cystoscopy with removal of right double-J stent. 2. Right ureteroscopy with holmium laser tripsy, stone manipulation,     and insertion of right double-J stent.  PREOPERATIVE DIAGNOSIS:  A 9 mm right proximal stone.  POSTOPERATIVE DIAGNOSIS:  A 9 mm right proximal stone with renal stones.  SURGEON:  Marshall Cork. Jeffie Pollock, M.D.  ANESTHESIA:  General.  SPECIMEN:  Stone fragments.  DRAINS:  A 6-French x 26 cm left double-J stent with tether.  BLOOD LOSS:  None.  COMPLICATIONS:  None.  INDICATIONS:  Mr. Lapinsky is a 66 year old male with a history of stones who originally presented with right flank pain and a creatinine of 9.  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  He underwent stenting and returns now for definitive ureteroscopy.  His creatinine had fallen to 2.97 on August 22.  FINDINGS AND PROCEDURE:  He was given Ancef.  He was taken to the operating room where general anesthetic was induced.  He was placed in lithotomy position.  His perineum and genitalia were prepped with Betadine solution and draped in usual sterile fashion.  Cystoscopy was performed using a 23-French scope and 30-degree lens. Examination revealed a normal urethra.  The external sphincter was intact.  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  There was mild trabeculation, and the left ureteral orifice was unremarkable.  There was a stent at the right ureteral orifice.  The stent was grasped and pulled to the urethral meatus.  A guidewire was then passed to the kidney and the stent was  removed.  A 6.5-French semi-rigid ureteroscope was passed per urethra up to ureter alongside the wire to the proximal ureter, the stone was not seen.  It was felt that it had been bounced back into the kidney and fluoroscopy confirmed this.  The 12/14 digital access sheath was then inserted over the wire and the inner core and wire were then removed.  I then passed the dual-lumen digital flexible scope.  The stone was visualized in a mid pole calyx. It was engaged with a 200 micron laser fiber initially on 1 watt and 10 hertz.  The frequency was carried back up to 45 to 8 fragmentation.  Once the stone was adequately fragmented, an NGage basket was used to remove the fragments.  Actually, prior to lasering the stone, some additional stones in the lower and upper pole calices that were small were removed.  I then removed the fragments from the lasered stone with additional lasering as needed.  Once all significant fragments were removed and only dusting remained, the ureteroscope was removed.  A guidewire was passed through the access sheath to the kidney.  The access sheath was removed and a 6-French x 26  cm Contour double-J stent was passed over the wire to the kidney.  The wire was removed leaving good coil in the kidney, a good coil in the bladder.  The tether was left exiting the urethra after the bladder was drained and the scope was removed, that was secured to the patient's penis.  He was then taken down from lithotomy position.  His anesthetic was reversed.  He was moved to recovery room in stable condition.  There were no complications.     Marshall Cork. Jeffie Pollock, M.D.     JJW/MEDQ  D:  03/25/2017  T:  03/25/2017  Job:  [id number]

## 2017-03-25 NOTE — Anesthesia Procedure Notes (Signed)
Procedure Name: LMA Insertion Date/Time: 03/25/2017 7:39 AM Performed by: Gaston Islam EVETTE Pre-anesthesia Checklist: Patient identified and Suction available Patient Re-evaluated:Patient Re-evaluated prior to induction Oxygen Delivery Method: Circle system utilized Preoxygenation: Pre-oxygenation with 100% oxygen Induction Type: IV induction Ventilation: Mask ventilation without difficulty LMA: LMA inserted LMA Size: 4.0 Number of attempts: 1 Airway Equipment and Method: Patient positioned with wedge pillow Placement Confirmation: positive ETCO2,  CO2 detector and breath sounds checked- equal and bilateral Tube secured with: Tape Dental Injury: Teeth and Oropharynx as per pre-operative assessment

## 2017-03-25 NOTE — Anesthesia Postprocedure Evaluation (Signed)
Anesthesia Post Note  Patient: BASEM YANNUZZI  Procedure(s) Performed: Procedure(s) (LRB): RIGHT URETEROSCOPY WITH HOLMIUM LASER STENT EXCHANGE (Right)     Patient location during evaluation: PACU Anesthesia Type: General Level of consciousness: awake and alert Pain management: pain level controlled Vital Signs Assessment: post-procedure vital signs reviewed and stable Respiratory status: spontaneous breathing, nonlabored ventilation and respiratory function stable Cardiovascular status: blood pressure returned to baseline and stable Postop Assessment: no signs of nausea or vomiting Anesthetic complications: no    Last Vitals:  Vitals:   03/25/17 1025 03/25/17 1034  BP: (!) 151/80 (!) 143/76  Pulse: 88 89  Resp: (!) 21 16  Temp: (!) 36.4 C (!) 36.4 C  SpO2: 93% 95%    Last Pain:  Vitals:   03/25/17 1025  TempSrc:   PainSc: 0-No pain                 Lynda Rainwater

## 2017-04-01 ENCOUNTER — Ambulatory Visit: Payer: Medicare Other

## 2017-04-03 ENCOUNTER — Telehealth: Payer: Self-pay

## 2017-04-03 ENCOUNTER — Ambulatory Visit (INDEPENDENT_AMBULATORY_CARE_PROVIDER_SITE_OTHER): Payer: Medicare Other | Admitting: Internal Medicine

## 2017-04-03 ENCOUNTER — Encounter: Payer: Self-pay | Admitting: Internal Medicine

## 2017-04-03 VITALS — BP 132/66 | HR 74 | Ht 65.0 in | Wt 180.5 lb

## 2017-04-03 DIAGNOSIS — I2510 Atherosclerotic heart disease of native coronary artery without angina pectoris: Secondary | ICD-10-CM | POA: Diagnosis not present

## 2017-04-03 DIAGNOSIS — Z7902 Long term (current) use of antithrombotics/antiplatelets: Secondary | ICD-10-CM | POA: Diagnosis not present

## 2017-04-03 DIAGNOSIS — K219 Gastro-esophageal reflux disease without esophagitis: Secondary | ICD-10-CM | POA: Diagnosis not present

## 2017-04-03 DIAGNOSIS — R195 Other fecal abnormalities: Secondary | ICD-10-CM

## 2017-04-03 DIAGNOSIS — Z9861 Coronary angioplasty status: Secondary | ICD-10-CM | POA: Diagnosis not present

## 2017-04-03 MED ORDER — PANTOPRAZOLE SODIUM 40 MG PO TBEC: 40.0000 mg | DELAYED_RELEASE_TABLET | Freq: Every day | ORAL | 0 refills | Status: DC

## 2017-04-03 NOTE — Progress Notes (Signed)
Jeffrey Holmes 66 y.o. [birth year] [id number]  Assessment & Plan:   Encounter Diagnoses  Name Primary?  . Positive colorectal cancer screening using Cologuard test Yes  . Gastroesophageal reflux disease, esophagitis presence not specified   . Long term current use of antithrombotics/antiplatelets - clopidogrel   . CAD S/P percutaneous coronary angioplasty and CABG    PPI pantoprazole40 mg daily for one or 2 months, and then try when necessary. I wanted him to work on diet and lifestyle changes for GERD as well and a handout was given. I have explained he should not use the Zegerid as it has omeprazole and there is at least some theoretical if not practical concern about interactions with clopidogrel.  Colonoscopy will be scheduled again to evaluate the Cologuard positive stool The risks and benefits as well as alternatives of endoscopic procedure(s) have been discussed and reviewed. All questions answered. The patient agrees to proceed. Additional risk of coronary vascular event off clopidogrel was explained. Rationale for holding to reduce bleeding is explained. We will clarify with his cardiologist about this as well. I have told him he should be on a baby aspirin 2 I think, especially when he is off his clopidogrel.   I appreciate the opportunity to care for this patient. CC: Nanci Pina, FNP     Subjective:   Chief Complaint: Heartburn and indigestion and some nausea  HPI the patient is here for follow-up, I had seen him earlier in the year he had a positive Cologuard but had to cancel his colonoscopy due to recurrent kidney stone problems. He is ready to do that now. Kidney stone issues have been treated and resolved. Though these are a chronic problem he feels well now. He reports increasing symptomatic heartburn and indigestion 3 or 4 times a week. He admits she drinks too much iced tea. Does not smoke. He has not had significant weight change that I am aware of. He does think  his diet could be improved with respect to reflux he hasn't really tried to do that. He uses over-the-counter Zegerid intermittently with good relief. There is no dysphagia. He remains on clopidogrel to treat his coronary artery disease with history of bypass surgery and stenting. He decided to stop aspirin on his own. He is tired of taking so many medications but he is interested in taking medication at least short-term on a daily basis to treat his heartburn.  Allergies  Allergen Reactions  . Vicodin [Hydrocodone-Acetaminophen] Nausea And Vomiting  . Hydrocodone Nausea And Vomiting  . Oxycontin [Oxycodone Hcl] Nausea And Vomiting  . Percocet [Oxycodone-Acetaminophen] Nausea And Vomiting   Current Meds  Medication Sig  . amLODipine (NORVASC) 5 MG tablet Take 1 tablet (5 mg total) by mouth daily.  [name] atorvastatin (LIPITOR) 80 MG tablet TAKE ONE TABLET BY MOUTH ONCE DAILY  . cephALEXin (KEFLEX) 250 MG capsule Take 1 capsule (250 mg total) by mouth 3 (three) times daily.  . clopidogrel (PLAVIX) 75 MG tablet TAKE ONE TABLET BY MOUTH ONCE DAILY  . Evolocumab (REPATHA SURECLICK) 169 MG/ML SOAJ Inject 1 pen into the skin every 14 (fourteen) days.  . Insulin Glargine (BASAGLAR KWIKPEN) 100 UNIT/ML SOPN Inject 0.2 mLs (20 Units total) into the skin at bedtime.  . insulin lispro (HUMALOG KWIKPEN) 100 UNIT/ML KiwkPen 3 times a day (just before each meal) 15-10-15, and pen needles 4/day (Patient taking differently: Inject 10 Units into the skin 2 (two) times daily. )  . metoprolol succinate (TOPROL-XL) 50 MG  24 hr tablet Take 1 tablet (50 mg total) by mouth 2 (two) times daily. Take with or immediately following a meal. (Patient taking differently: Take 50 mg by mouth daily. Take with or immediately following a meal.)  . nitroGLYCERIN (NITROSTAT) 0.4 MG SL tablet Place 0.4 mg under the tongue as needed. Place 1 tablet under your tongue every 5 minutes as needed for chest pain for a max of 3 doses  .  ondansetron (ZOFRAN-ODT) 4 MG disintegrating tablet Take 4 mg by mouth every 8 (eight) hours as needed for nausea/vomiting.  . timolol (TIMOPTIC) 0.5 % ophthalmic solution Place 1 drop into both eyes 2 (two) times daily.   . traMADol (ULTRAM) 50 MG tablet Take 1 tablet (50 mg total) by mouth every 6 (six) hours as needed (FOR PAIN).  [name] travoprost, benzalkonium, (TRAVATAN) 0.004 % ophthalmic solution Place 1 drop into both eyes at bedtime.   Past Medical History:  Diagnosis Date  . Allergy   . Benign essential HTN 08/08/2015  . CAD (coronary artery disease), native coronary artery 08/28/2015   Severe multivessel ASCAD s/p CABG hybrid with LIMA to LAD and diag and PCI of the RCA.   [name] Chronic back pain    L5-S1  . Chronic kidney disease, stage 3    multiple kidney stones  . Diabetes mellitus without complication ([place])    TYPE 2  . GERD (gastroesophageal reflux disease)    OCC  . H/O hiatal hernia   . Hepatic cyst 11/01/2016  . History of acute renal failure 2015   secondary to stones, ELEVATED CREATININE, NO DIALYSIS DONE  . History of kidney stones   . Hyperlipidemia 08/08/2015  . NAFLD (nonalcoholic fatty liver disease) 11/01/2016  . Splenic cyst 11/01/2016  . Ureteral calculus 01/06/2014   Past Surgical History:  Procedure Laterality Date  . ANGIOPLASTY N/A 08/21/2015   Procedure: ANGIOPLASTY WITH PERCUTANEOUS CORONARY INTERVENTION WITH DES TO RIGHT CORONARY ARTERY;  Surgeon: Jettie Booze, MD;  Location: [place];  Service: Cardiovascular;  Laterality: N/A;  . BACK SURGERY  2006   microdisectomy L5-S1  . CARDIAC CATHETERIZATION N/A 08/18/2015   Procedure: Left Heart Cath and Coronary Angiography;  Surgeon: Jettie Booze, MD;  Location: [place] [facility];  Service: Cardiovascular;  Laterality: N/A;  . CARDIAC CATHETERIZATION N/A 08/21/2015   Procedure: Coronary Stent Intervention;  Surgeon: Jettie Booze, MD;  Location: [place] [facility];  Service: Cardiovascular;   Laterality: N/A;  . CARDIAC CATHETERIZATION  08/21/2015   Procedure: Bypass Graft Angiography;  Surgeon: Jettie Booze, MD;  Location: [place] [facility];  Service: Cardiovascular;;  . CARDIOVASCULAR STRESS TEST  08/15/2015  . COLONOSCOPY    . CORONARY ARTERY BYPASS GRAFT N/A 08/21/2015   Procedure: OFF PUMP CORONARY ARTERY BYPASS GRAFTING (CABG), TIMES TWO, USING LEFT INTERNAL MAMMARY ARTERY, RIGHT GREATER SAPHENOUS VEIN HARVESTED ENDOSCOPICALLY;  Surgeon: [name], MD;  Location: [place];  Service: Open Heart Surgery;  Laterality: N/A;  . CYSTOSCOPY W/ URETERAL STENT PLACEMENT Right 03/12/2017   Procedure: CYSTOSCOPY WITH RETROGRADE PYELOGRAM/URETERAL STENT PLACEMENT;  Surgeon: Bjorn Loser, MD;  Location: [facility];  Service: Urology;  Laterality: Right;  . CYSTOSCOPY WITH RETROGRADE PYELOGRAM, URETEROSCOPY AND STENT PLACEMENT Right 01/06/2014   Procedure: CYSTOSCOPY WITH RETROGRADE PYELOGRAM, URETEROSCOPY AND STENT PLACEMENT;  Surgeon: Bernestine Amass, MD;  Location: [facility];  Service: Urology;  Laterality: Right;  . CYSTOSCOPY/URETEROSCOPY/HOLMIUM LASER/STENT PLACEMENT Right 03/25/2017   Procedure: RIGHT URETEROSCOPY WITH HOLMIUM LASER STENT EXCHANGE;  Surgeon: [name], MD;  Location[facility];  Service: Urology;  Laterality: Right;  . ESOPHAGOGASTRODUODENOSCOPY ENDOSCOPY     normal  . LITHOTRIPSY     2-3 times in past  . TEE WITHOUT CARDIOVERSION N/A 08/21/2015   Procedure: TRANSESOPHAGEAL ECHOCARDIOGRAM (TEE);  Surgeon: [name], MD;  Location: [place];  Service: Open Heart Surgery;  Laterality: N/A;  . TONSILLECTOMY    . WISDOM TOOTH EXTRACTION    Review of Systems  as per history of present illness   Objective:   Physical Exam BP 132/66   Pulse 74   Ht 5\' 5"  (1.651 m)   Wt 180 lb 8 oz (81.9 kg)   BMI 30.04 kg/m   well-developed well-nourished black man in no acute distress   eyes are anicteric Lungs are clear Sounds are normal  Data reviewed today  includes recent operative note regarding treatment of kidney stone and stenting. I reviewed recent labs in the EMR. Also recent GI notes from previous visits.

## 2017-04-03 NOTE — Telephone Encounter (Signed)
[facility] [address] [place] [address]  RE: Jeffrey Holmes DOB: 1951/06/13 MRN: [id number]   Dear Dr Fransico Him,    We have scheduled the above patient for an endoscopic procedure. Our records show that he is on anticoagulation therapy.   Please advise as to how long the patient may come off his therapy of Plavix prior to the colonoscopy procedure, which is scheduled for 06/05/17.  Please fax back/ or route the completed form to Sandi Towe [place], [place] at [phone].   Sincerely,    Silvano Rusk, MD, [facility]

## 2017-04-03 NOTE — Patient Instructions (Signed)
  You have been scheduled for a colonoscopy. Please follow written instructions given to you at your visit today.  Please pick up your prep supplies at the pharmacy. If you use inhalers (even only as needed), please bring them with you on the day of your procedure.   You will be contaced by our office prior to your procedure for directions on holding your Plavix.  If you do not hear from our office 1 week prior to your scheduled procedure, please call [phone] to discuss.  Per Dr Carlean Purl take an  81mg  Asprin while holding the Plavix .  We have sent the following medications to your pharmacy for you to pick up at your convenience: Pantoprazole  We are giving you a GERD handout to read and follow.   I appreciate the opportunity to care for you. Silvano Rusk, MD, [facility]

## 2017-04-04 NOTE — Telephone Encounter (Signed)
Patient informed and verbalized understanding

## 2017-04-04 NOTE — Telephone Encounter (Signed)
Routed to Patti [place].

## 2017-04-04 NOTE — Telephone Encounter (Signed)
OK to hold Plavix for 5-7 days prior to colonoscopy as well as ASA

## 2017-04-04 NOTE — Telephone Encounter (Signed)
5 days - he should take ASA when off Plavix

## 2017-04-04 NOTE — Telephone Encounter (Signed)
Please advise # of days you wish me to tell the patient Sir and also advise regarding ASA?  Thank you.

## 2017-04-08 DIAGNOSIS — N200 Calculus of kidney: Secondary | ICD-10-CM | POA: Diagnosis not present

## 2017-04-08 DIAGNOSIS — R3121 Asymptomatic microscopic hematuria: Secondary | ICD-10-CM | POA: Diagnosis not present

## 2017-04-17 DIAGNOSIS — N261 Atrophy of kidney (terminal): Secondary | ICD-10-CM | POA: Diagnosis not present

## 2017-04-17 DIAGNOSIS — N135 Crossing vessel and stricture of ureter without hydronephrosis: Secondary | ICD-10-CM | POA: Diagnosis not present

## 2017-04-17 DIAGNOSIS — N184 Chronic kidney disease, stage 4 (severe): Secondary | ICD-10-CM | POA: Diagnosis not present

## 2017-04-17 DIAGNOSIS — N183 Chronic kidney disease, stage 3 (moderate): Secondary | ICD-10-CM | POA: Diagnosis not present

## 2017-04-17 DIAGNOSIS — N209 Urinary calculus, unspecified: Secondary | ICD-10-CM | POA: Diagnosis not present

## 2017-04-17 DIAGNOSIS — Z6829 Body mass index (BMI) 29.0-29.9, adult: Secondary | ICD-10-CM | POA: Diagnosis not present

## 2017-04-21 ENCOUNTER — Other Ambulatory Visit: Payer: Self-pay | Admitting: Nephrology

## 2017-04-21 DIAGNOSIS — N183 Chronic kidney disease, stage 3 unspecified: Secondary | ICD-10-CM

## 2017-04-22 ENCOUNTER — Ambulatory Visit
Admission: RE | Admit: 2017-04-22 | Discharge: 2017-04-22 | Disposition: A | Discharge status: Home / Self Care | Payer: Medicare Other | Source: Ambulatory Visit | Attending: Nephrology | Admitting: Nephrology

## 2017-04-22 DIAGNOSIS — N183 Chronic kidney disease, stage 3 unspecified: Secondary | ICD-10-CM

## 2017-04-22 DIAGNOSIS — N200 Calculus of kidney: Secondary | ICD-10-CM | POA: Diagnosis not present

## 2017-04-28 ENCOUNTER — Other Ambulatory Visit: Payer: Self-pay | Admitting: Cardiology

## 2017-04-30 ENCOUNTER — Other Ambulatory Visit: Payer: Self-pay | Admitting: *Deleted

## 2017-06-05 ENCOUNTER — Encounter: Payer: Self-pay | Admitting: Internal Medicine

## 2017-06-05 ENCOUNTER — Other Ambulatory Visit: Payer: Self-pay

## 2017-06-05 ENCOUNTER — Ambulatory Visit (AMBULATORY_SURGERY_CENTER): Payer: Medicare Other | Admitting: Internal Medicine

## 2017-06-05 VITALS — BP 137/71 | HR 52 | Temp 96.9°F | Resp 22 | Ht 65.0 in | Wt 180.0 lb

## 2017-06-05 DIAGNOSIS — D126 Benign neoplasm of colon, unspecified: Secondary | ICD-10-CM

## 2017-06-05 DIAGNOSIS — R195 Other fecal abnormalities: Secondary | ICD-10-CM | POA: Diagnosis present

## 2017-06-05 DIAGNOSIS — D128 Benign neoplasm of rectum: Secondary | ICD-10-CM

## 2017-06-05 DIAGNOSIS — K635 Polyp of colon: Secondary | ICD-10-CM

## 2017-06-05 DIAGNOSIS — Z1211 Encounter for screening for malignant neoplasm of colon: Secondary | ICD-10-CM | POA: Diagnosis not present

## 2017-06-05 DIAGNOSIS — D121 Benign neoplasm of appendix: Secondary | ICD-10-CM | POA: Diagnosis not present

## 2017-06-05 DIAGNOSIS — D120 Benign neoplasm of cecum: Secondary | ICD-10-CM

## 2017-06-05 DIAGNOSIS — D123 Benign neoplasm of transverse colon: Secondary | ICD-10-CM | POA: Diagnosis not present

## 2017-06-05 DIAGNOSIS — K621 Rectal polyp: Secondary | ICD-10-CM | POA: Diagnosis not present

## 2017-06-05 MED ORDER — SODIUM CHLORIDE 0.9 % IV SOLN: 500.0000 mL | INTRAVENOUS | Status: DC

## 2017-06-05 NOTE — Op Note (Signed)
[facility] Patient Name: Jeffrey Holmes Procedure Date: 06/05/2017 8:02 AM MRN: [id number] Endoscopist: Gatha Mayer , MD Age: 66 Referring MD:  Date of Birth: 04-May-1951 Gender: Male Account #: [id number] Procedure:                Colonoscopy Indications:              Positive Cologuard test Medicines:                Propofol per Anesthesia, Monitored Anesthesia Care Procedure:                Pre-Anesthesia Assessment:                           - Prior to the procedure, a History and Physical                            was performed, and patient medications and                            allergies were reviewed. The patient's tolerance of                            previous anesthesia was also reviewed. The risks                            and benefits of the procedure and the sedation                            options and risks were discussed with the patient.                            All questions were answered, and informed consent                            was obtained. Prior Anticoagulants: The patient                            last took Plavix (clopidogrel) 5 days prior to the                            procedure. ASA Grade Assessment: II - A patient                            with mild systemic disease. After reviewing the                            risks and benefits, the patient was deemed in                            satisfactory condition to undergo the procedure.                           After obtaining informed consent, the colonoscope  was passed under direct vision. Throughout the                            procedure, the patient's blood pressure, pulse, and                            oxygen saturations were monitored continuously. The                            Colonoscope was introduced through the anus and                            advanced to the the cecum, identified by                            appendiceal orifice and  ileocecal valve. The                            patient tolerated the procedure well. The quality                            of the bowel preparation was good. The ileocecal                            valve, appendiceal orifice, and rectum were                            photographed. The bowel preparation used was                            Miralax. The colonoscopy was somewhat difficult due                            to significant looping. Successful completion of                            the procedure was aided by applying abdominal                            pressure. Scope In: 8:06:35 AM Scope Out: 8:24:40 AM Scope Withdrawal Time: 0 hours 13 minutes 26 seconds  Total Procedure Duration: 0 hours 18 minutes 5 seconds  Findings:                 The perianal and digital rectal examinations were                            normal. Pertinent negatives include normal prostate                            (size, shape, and consistency).                           A 5 mm polyp was found in the transverse colon. The  polyp was sessile. The polyp was removed with a                            cold snare. Resection and retrieval were complete.                            Verification of patient identification for the                            specimen was done. Estimated blood loss was minimal.                           Three sessile polyps were found in the rectum and                            appendiceal orifice. The polyps were 2 to 3 mm in                            size. These polyps were removed with a cold biopsy                            forceps. Resection and retrieval were complete.                            Verification of patient identification for the                            specimen was done. Estimated blood loss was minimal.                           Scattered diverticula were found in the entire                            colon.                            Internal hemorrhoids were found during retroflexion.                           The exam was otherwise without abnormality on                            direct and retroflexion views. Complications:            No immediate complications. Estimated Blood Loss:     Estimated blood loss was minimal. Impression:               - One 5 mm polyp in the transverse colon, removed                            with a cold snare. Resected and retrieved.                           - Three 2 to 3 mm polyps in the rectum  and at the                            appendiceal orifice, removed with a cold biopsy                            forceps. Resected and retrieved.                           - Diverticulosis in the entire examined colon.                           - Internal hemorrhoids.                           - The examination was otherwise normal on direct                            and retroflexion views. Recommendation:           - Patient has a contact number available for                            emergencies. The signs and symptoms of potential                            delayed complications were discussed with the                            patient. Return to normal activities tomorrow.                            Written discharge instructions were provided to the                            patient.                           - Resume previous diet.                           - Continue present medications.                           - Resume Plavix (clopidogrel) at prior dose today.                           - Repeat colonoscopy is recommended. The                            colonoscopy date will be determined after pathology                            results from today's exam become available for                            review. Gatha Mayer,  MD 06/05/2017 8:41:16 AM This report has been signed electronically.

## 2017-06-05 NOTE — Progress Notes (Signed)
A/ox3 pleased with MAC, report to Celia RN 

## 2017-06-05 NOTE — Patient Instructions (Addendum)
I found and removed 4 very small polyps that look benign. I also saw some diverticulosis (pouches) and internal hemorrhoids.  I will let you know pathology results and when to have another routine colonoscopy by mail and/or My Chart.  Please restart Plavix today.  I appreciate the opportunity to care for you. Gatha Mayer, MD, [facility]   Discharge instructions given. Handouts on polyps,diverticulosis and hemorrhoids. Resume previous medications. YOU HAD AN ENDOSCOPIC PROCEDURE TODAY AT [facility]:   Refer to the procedure report that was given to you for any specific questions about what was found during the examination.  If the procedure report does not answer your questions, please call your gastroenterologist to clarify.  If you requested that your care partner not be given the details of your procedure findings, then the procedure report has been included in a sealed envelope for you to review at your convenience later.  YOU SHOULD EXPECT: Some feelings of bloating in the abdomen. Passage of more gas than usual.  Walking can help get rid of the air that was put into your GI tract during the procedure and reduce the bloating. If you had a lower endoscopy (such as a colonoscopy or flexible sigmoidoscopy) you may notice spotting of blood in your stool or on the toilet paper. If you underwent a bowel prep for your procedure, you may not have a normal bowel movement for a few days.  Please Note:  You might notice some irritation and congestion in your nose or some drainage.  This is from the oxygen used during your procedure.  There is no need for concern and it should clear up in a day or so.  SYMPTOMS TO REPORT IMMEDIATELY:   Following lower endoscopy (colonoscopy or flexible sigmoidoscopy):  Excessive amounts of blood in the stool  Significant tenderness or worsening of abdominal pains  Swelling of the abdomen that is new, acute  Fever of 100F or  higher   For urgent or emergent issues, a gastroenterologist can be reached at any hour by calling [phone].   DIET:  We do recommend a small meal at first, but then you may proceed to your regular diet.  Drink plenty of fluids but you should avoid alcoholic beverages for 24 hours.  ACTIVITY:  You should plan to take it easy for the rest of today and you should NOT DRIVE or use heavy machinery until tomorrow (because of the sedation medicines used during the test).    FOLLOW UP: Our staff will call the number listed on your records the next business day following your procedure to check on you and address any questions or concerns that you may have regarding the information given to you following your procedure. If we do not reach you, we will leave a message.  However, if you are feeling well and you are not experiencing any problems, there is no need to return our call.  We will assume that you have returned to your regular daily activities without incident.  If any biopsies were taken you will be contacted by phone or by letter within the next 1-3 weeks.  Please call us at [phone] if you have not heard about the biopsies in 3 weeks.    SIGNATURES/CONFIDENTIALITY: You and/or your care partner have signed paperwork which will be entered into your electronic medical record.  These signatures attest to the fact that that the information above on your After Visit Summary has been reviewed and is  understood.  Full responsibility of the confidentiality of this discharge information lies with you and/or your care-partner. 

## 2017-06-05 NOTE — Progress Notes (Signed)
Called to room to assist during endoscopic procedure.  Patient ID and intended procedure confirmed with present staff. Received instructions for my participation in the procedure from the performing physician.  

## 2017-06-06 ENCOUNTER — Telehealth: Payer: Self-pay

## 2017-06-06 NOTE — Telephone Encounter (Signed)
Left message on answering machine. 

## 2017-06-06 NOTE — Telephone Encounter (Signed)
Attempted to reach pt. With follow up call following endoscopic procedure 06/05/2017.  LM on pt. Ans. Machine.

## 2017-06-11 ENCOUNTER — Encounter: Payer: Self-pay | Admitting: Internal Medicine

## 2017-06-11 DIAGNOSIS — Z860101 Personal history of adenomatous and serrated colon polyps: Secondary | ICD-10-CM

## 2017-06-11 DIAGNOSIS — Z86010 Personal history of colonic polyps: Secondary | ICD-10-CM

## 2017-06-11 HISTORY — DX: Personal history of colonic polyps: Z86.010

## 2017-06-11 HISTORY — DX: Personal history of adenomatous and serrated colon polyps: Z86.0101

## 2017-06-11 NOTE — Progress Notes (Signed)
2 adernomas Recall 2023 My Chart letter

## 2017-06-16 DIAGNOSIS — N261 Atrophy of kidney (terminal): Secondary | ICD-10-CM | POA: Diagnosis not present

## 2017-06-16 DIAGNOSIS — N135 Crossing vessel and stricture of ureter without hydronephrosis: Secondary | ICD-10-CM | POA: Diagnosis not present

## 2017-06-16 DIAGNOSIS — N183 Chronic kidney disease, stage 3 (moderate): Secondary | ICD-10-CM | POA: Diagnosis not present

## 2017-06-16 DIAGNOSIS — Z6829 Body mass index (BMI) 29.0-29.9, adult: Secondary | ICD-10-CM | POA: Diagnosis not present

## 2017-06-16 DIAGNOSIS — N209 Urinary calculus, unspecified: Secondary | ICD-10-CM | POA: Diagnosis not present

## 2017-06-18 DIAGNOSIS — N200 Calculus of kidney: Secondary | ICD-10-CM | POA: Diagnosis not present

## 2017-06-18 DIAGNOSIS — N281 Cyst of kidney, acquired: Secondary | ICD-10-CM | POA: Diagnosis not present

## 2017-06-18 DIAGNOSIS — N5201 Erectile dysfunction due to arterial insufficiency: Secondary | ICD-10-CM | POA: Diagnosis not present

## 2017-06-20 IMAGING — CT CT RENAL STONE PROTOCOL
2 of 4 series · 14 of 46 positions shown, 16 images · non-contrast
Comparison: December 22, 2013

CLINICAL DATA: Two day history of left flank pain.

EXAM:
CT ABDOMEN AND PELVIS WITHOUT CONTRAST
TECHNIQUE: Multidetector CT imaging of the abdomen and pelvis was performed
following the standard protocol without oral or intravenous contrast
material administration.

[Series 2: renal stone 5mm · axial · 0.93mm/px · z∈[-458,-18]mm · 11 of 98 slices shown, 13 images]
[im 5/98  soft-tissue]
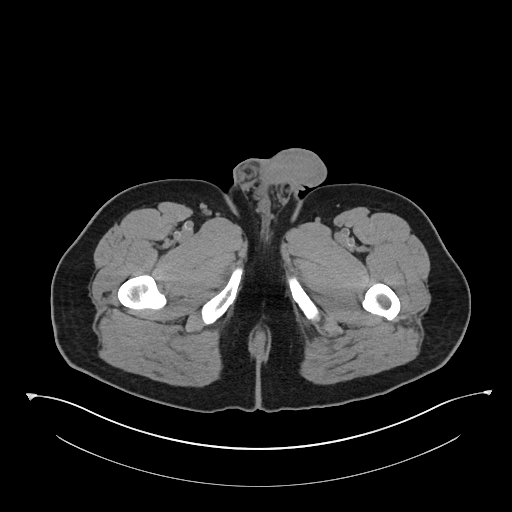
[im 5/98  bone]
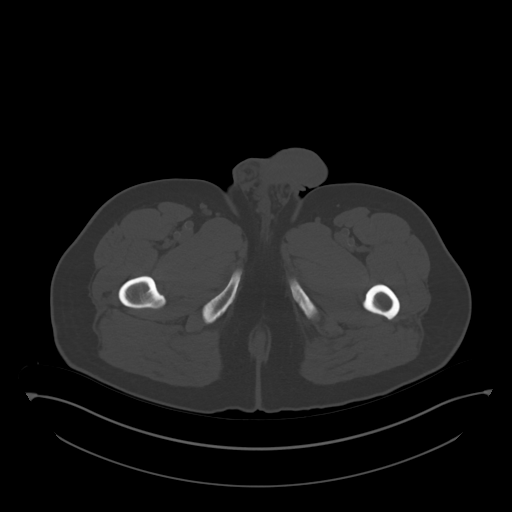
[im 14/98  soft-tissue]
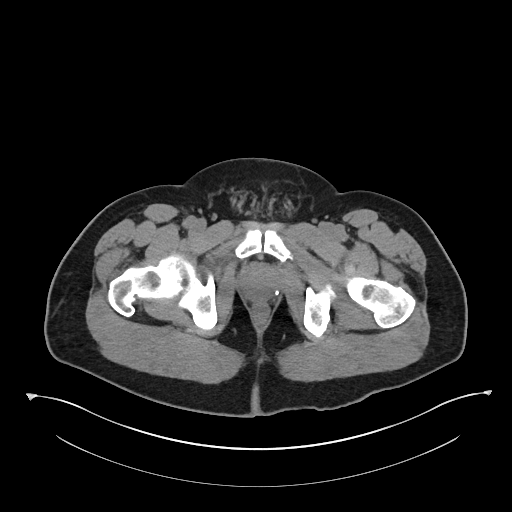
[im 23/98  soft-tissue]
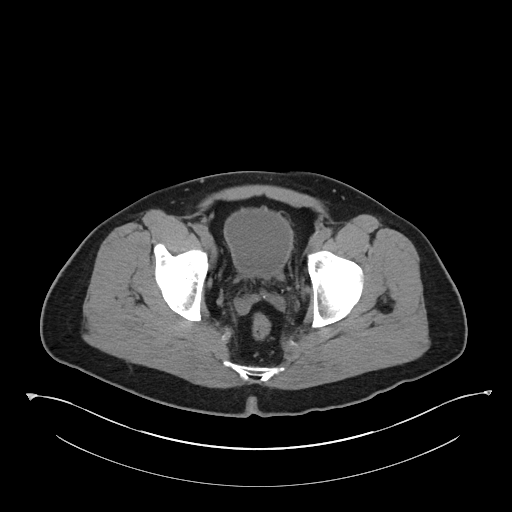
[im 31/98  soft-tissue]
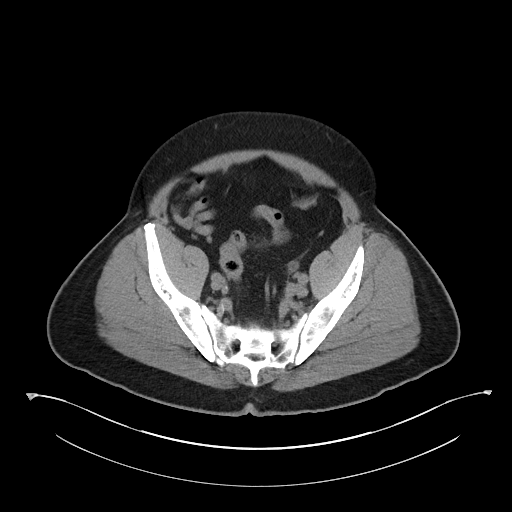
[im 40/98  soft-tissue]
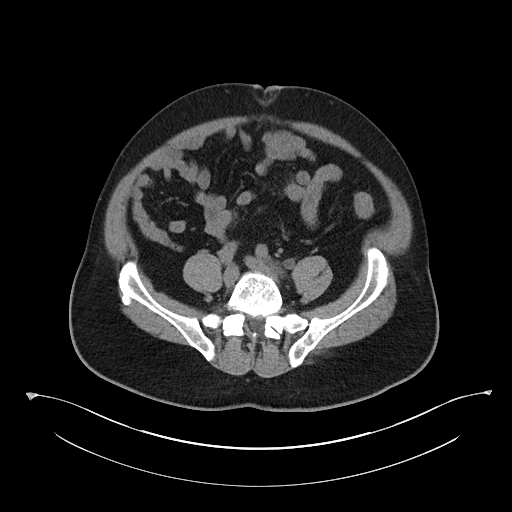
[im 49/98  soft-tissue]
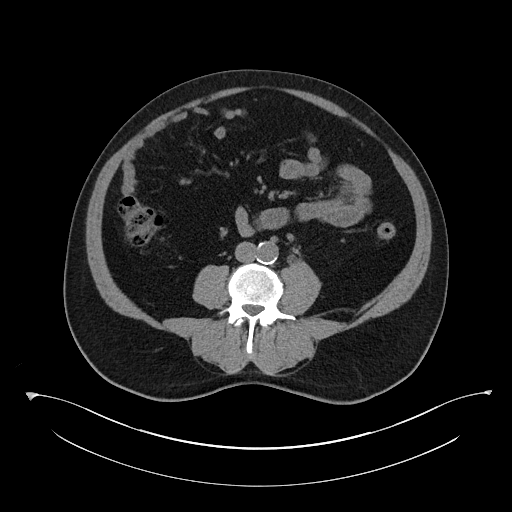
[im 58/98  soft-tissue]
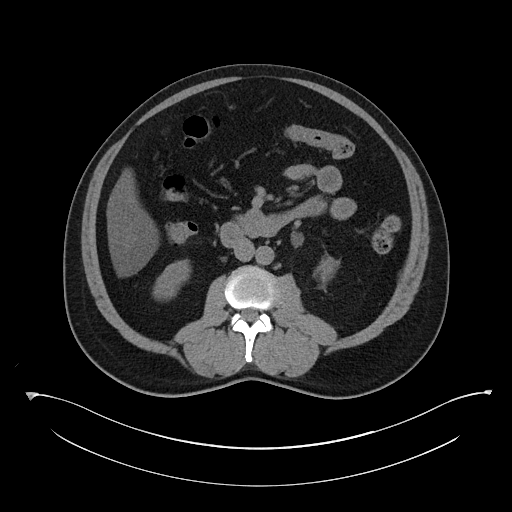
[im 67/98  soft-tissue]
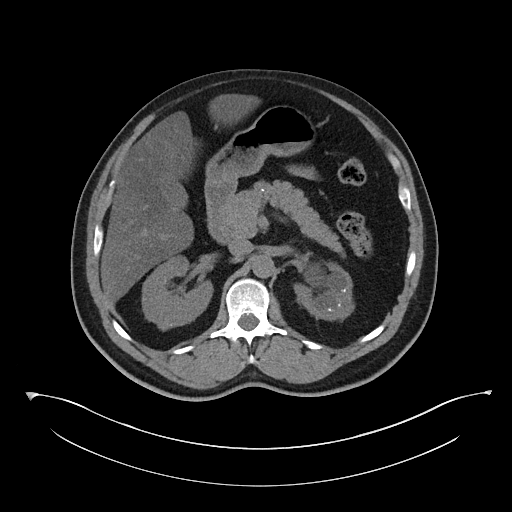
[im 75/98  soft-tissue]
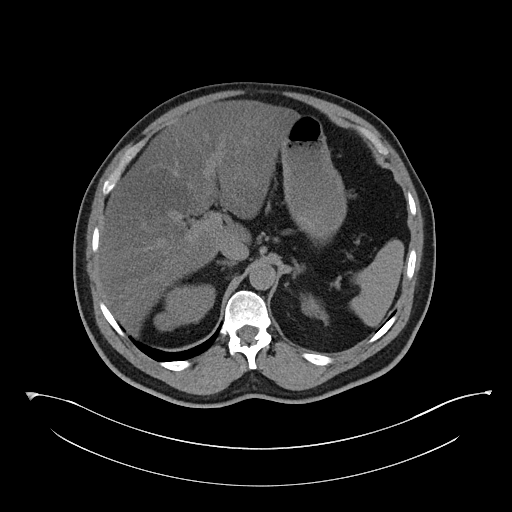
[im 75/98  bone]
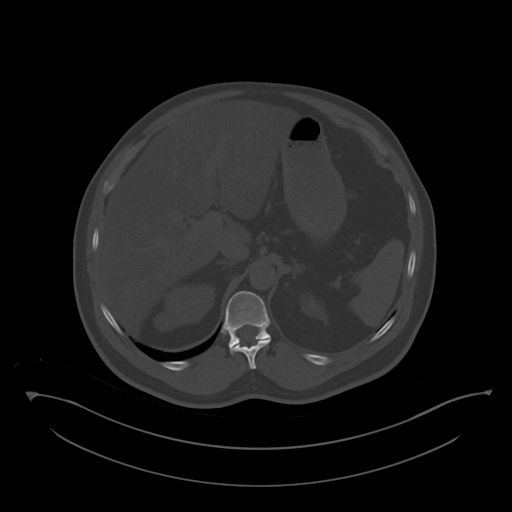
[im 84/98  soft-tissue]
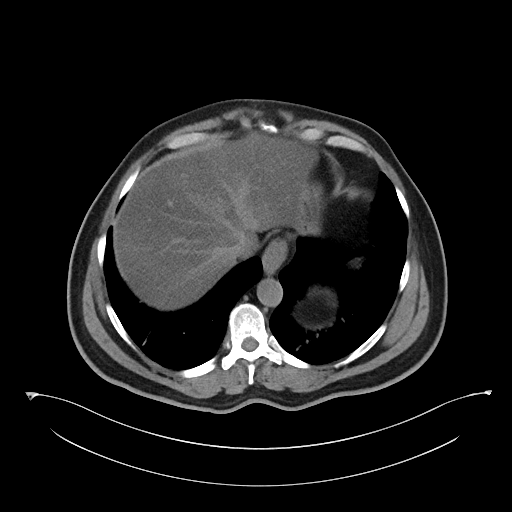
[im 93/98  soft-tissue]
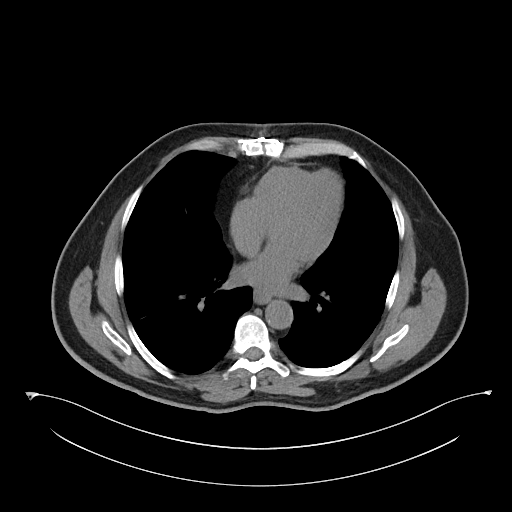

[Series 5: renal stone 3.0 cor · coronal · 0.73mm/px · 3 of 108 slices shown]
[im 36/108  soft-tissue]
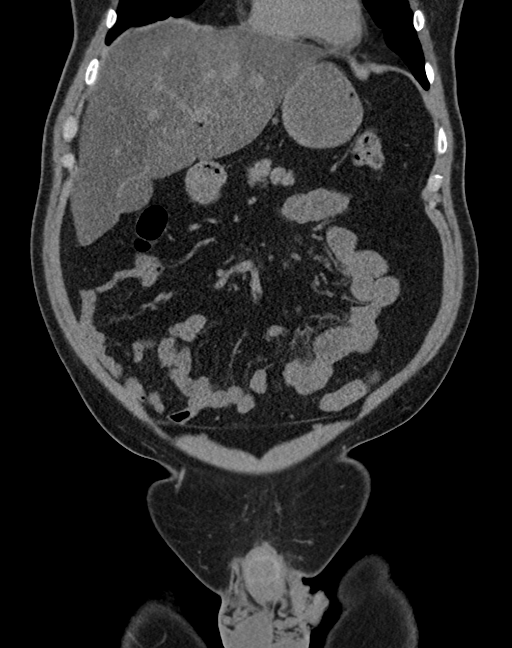
[im 48/108  soft-tissue]
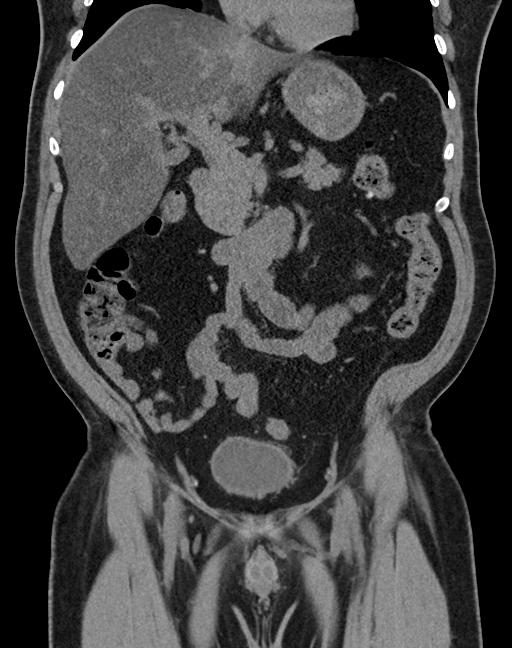
[im 60/108  soft-tissue]
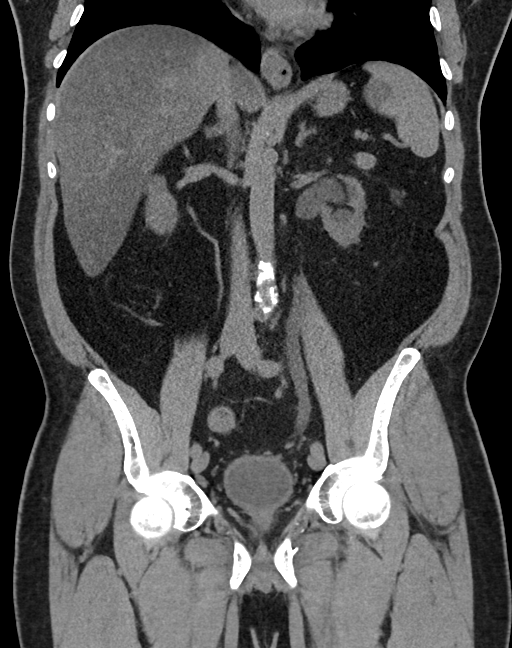

[14 of 46 positions shown; findings below may reference images not displayed]

FINDINGS: Lower chest: There is bibasilar lung scarring. There is a small
calcification in the anterior pericardium slightly to the left of
midline without associated thickening.

Hepatobiliary: Liver is prominent, measuring 20.1 cm. There is
diffuse hepatic steatosis. No focal liver lesions are identified.
Gallbladder wall is not appreciably thickened. There is questionable
sludge in the gallbladder. There is no biliary duct dilatation.

Pancreas: There is a stable punctate focus of calcification in the
body of the pancreas near the junction with the head. There is no
pancreatic mass or inflammatory focus. No peripancreatic fluid.

Spleen: There is a cyst in the medial spleen measuring 3.3 x 3.0 cm.
Spleen otherwise appears unremarkable.

Adrenals/Urinary Tract: Adrenals appear normal bilaterally. There is
a cyst arising from the posterior upper pole right kidney measuring
2.5 x 2.3 cm. There is a cyst arising from the lateral mid left
kidney measuring 1.5 x 1.5 cm. There is no hydronephrosis on the
right. There is severe hydronephrosis on the left. On the right,
there are multiple 1-2 mm calculi throughout the kidney. There is a
calculus in the mid right kidney measuring 1.0 x 1.0 cm. There is no
ureteral calculus on the right. On the left, there are scattered
calculi measuring between 1 and 4 mm in size. There is a calculus in
the distal left ureter measuring 7 x 6 mm. No other ureteral calculi
are evident. There is ureterectasis to the level of this distal
ureteral calculus. Urinary bladder is midline with wall thickness
within normal limits.

Stomach/Bowel: There is no bowel wall or mesenteric thickening.
There are scattered colonic diverticula without diverticulitis.
There is no bowel obstruction. No free air or portal venous air.

Vascular/Lymphatic: There is atherosclerotic calcification in the
aorta. There is no demonstrable abdominal aortic aneurysm. No
vascular lesions are evident beyond calcification on this
noncontrast enhanced study. There is no adenopathy in the abdomen or
pelvis.

Reproductive: Prostate and seminal vesicles appear normal. There is
no pelvic mass or pelvic fluid collection.

Other: The appendix appears normal. There is no ascites or abscess
in the abdomen or pelvis. There is a small ventral hernia containing
only fat.

Musculoskeletal: There is marked disc space narrowing at L5-S1 with
vacuum phenomenon at this level. There are no blastic or lytic bone
lesions. There is no intramuscular or abdominal wall lesion.
IMPRESSION: 7 x 6 mm calculus distal left ureter causing severe hydronephrosis
in ureterectasis on the left.

Multiple calculi in both kidneys measuring between 1 and 4 mm. There
are more calculi on the right than on the left, although there is
currently no hydronephrosis on the left. Note that there is a 1.0 x
1.0 cm calculus in the posterior mid right kidney.

Small calcification in the body of the pancreas near the junction
with the head, stable. Question minimal change of previous
pancreatitis. Pancreas otherwise appears normal.

Prominent liver with hepatic steatosis.

Small focus of calcification in the anterior pericardium without
pericardial thickening. Significance of this finding is uncertain.

Small ventral hernia containing only fat.

Marked disc narrowing L5-S1 with vacuum phenomenon at this level.

No bowel obstruction.  No abscess.  Appendix appears normal.

## 2017-07-01 DIAGNOSIS — Z794 Long term (current) use of insulin: Secondary | ICD-10-CM | POA: Diagnosis not present

## 2017-07-01 DIAGNOSIS — N183 Chronic kidney disease, stage 3 (moderate): Secondary | ICD-10-CM | POA: Diagnosis not present

## 2017-07-01 DIAGNOSIS — E119 Type 2 diabetes mellitus without complications: Secondary | ICD-10-CM | POA: Diagnosis not present

## 2017-07-01 DIAGNOSIS — E785 Hyperlipidemia, unspecified: Secondary | ICD-10-CM | POA: Diagnosis not present

## 2017-07-01 DIAGNOSIS — I2510 Atherosclerotic heart disease of native coronary artery without angina pectoris: Secondary | ICD-10-CM | POA: Diagnosis not present

## 2017-08-02 ENCOUNTER — Other Ambulatory Visit: Payer: Self-pay | Admitting: Nurse Practitioner

## 2017-08-07 DIAGNOSIS — I10 Essential (primary) hypertension: Secondary | ICD-10-CM | POA: Diagnosis not present

## 2017-08-07 DIAGNOSIS — Z79899 Other long term (current) drug therapy: Secondary | ICD-10-CM | POA: Diagnosis not present

## 2017-08-07 DIAGNOSIS — E1122 Type 2 diabetes mellitus with diabetic chronic kidney disease: Secondary | ICD-10-CM | POA: Diagnosis not present

## 2017-08-18 ENCOUNTER — Telehealth: Payer: Self-pay | Admitting: Pharmacist

## 2017-08-18 DIAGNOSIS — E782 Mixed hyperlipidemia: Secondary | ICD-10-CM

## 2017-08-18 NOTE — Telephone Encounter (Signed)
Called pt to schedule lipid panel - required for Repatha reauthorization with insurance. Pt will come in 1/25 for fasting labs.

## 2017-08-22 ENCOUNTER — Other Ambulatory Visit: Payer: Medicare Other | Admitting: *Deleted

## 2017-08-22 ENCOUNTER — Telehealth: Payer: Self-pay | Admitting: Cardiology

## 2017-08-22 DIAGNOSIS — E782 Mixed hyperlipidemia: Secondary | ICD-10-CM | POA: Diagnosis not present

## 2017-08-22 LAB — LIPID PANEL
Chol/HDL Ratio: 2.2 ratio (ref 0.0–5.0)
Cholesterol, Total: 94 mg/dL — ABNORMAL LOW (ref 100–199)
HDL: 43 mg/dL (ref >39)
LDL Calculated: 20 mg/dL (ref 0–99)
Triglycerides: 156 mg/dL — ABNORMAL HIGH (ref 0–149)
VLDL Cholesterol Cal: 31 mg/dL (ref 5–40)

## 2017-08-22 NOTE — Telephone Encounter (Signed)
New Message   [name] from [employer] is calling about a prior authorization for Jeffrey Holmes. Please call.

## 2017-08-22 NOTE — Telephone Encounter (Signed)
Returned call and advised that PA has not been submitted as patient has not yet had blood work drawn. He was scheduled for today, but did not have drawn.

## 2017-08-25 NOTE — Telephone Encounter (Signed)
Labs resulted. PA faxed to [facility]. Pending reauthorization.

## 2017-08-26 NOTE — Telephone Encounter (Signed)
Attempted to reach pharmacy and placed on hold for >10 minutes. Will try again later.

## 2017-08-26 NOTE — Telephone Encounter (Signed)
Cerita with [employer] is calling about Repatha PA please return call to [phone] and hit option #2. Please address.

## 2017-08-28 NOTE — Telephone Encounter (Signed)
Repatha PA approved through 08/25/18.

## 2017-08-28 NOTE — Telephone Encounter (Signed)
Returned call and advised that PA has been approved.

## 2017-09-04 ENCOUNTER — Telehealth: Payer: Self-pay

## 2017-09-04 DIAGNOSIS — R935 Abnormal findings on diagnostic imaging of other abdominal regions, including retroperitoneum: Secondary | ICD-10-CM

## 2017-09-04 NOTE — Telephone Encounter (Signed)
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  Dr Carlean Purl ordering Dr.

## 2017-09-04 NOTE — Telephone Encounter (Signed)
Left message for patient to call me back so I can find out when is good for him to go for his MRI.

## 2017-09-04 NOTE — Telephone Encounter (Signed)
Left Jeffrey Holmes another message to call me back, I missed his call back as I was gone to lunch.

## 2017-09-15 NOTE — Telephone Encounter (Signed)
Left him a message to call me back so I can find out a good day/time for his MRI to be done.

## 2017-09-16 NOTE — Telephone Encounter (Signed)
Jeffrey Holmes called back and he request [facility] and an AM appointment for his MRI.  I will set this up and call him back with date/time.

## 2017-09-17 NOTE — Telephone Encounter (Signed)
Patient called back and confirmed he had gotten the message about his MRI.

## 2017-09-17 NOTE — Telephone Encounter (Addendum)
I got Jeffrey Holmes's MRI ABD with and without contrast set up for 10/02/17 at 9:50AM, arrive at 9:30AM, NPO solids 4 hours, may have liquids up until 1 hour prior. [address] They will do his BUN and creatinine there prior to the MRI. I will notify Jeffrey Holmes of date/time. I called and left him a detailed message with the appointment details and told him to call me back to confirm he got the message.

## 2017-10-02 ENCOUNTER — Other Ambulatory Visit: Payer: Medicare Other

## 2017-10-16 ENCOUNTER — Ambulatory Visit
Admission: RE | Admit: 2017-10-16 | Discharge: 2017-10-16 | Disposition: A | Discharge status: Home / Self Care | Payer: Medicare Other | Source: Ambulatory Visit | Attending: Internal Medicine | Admitting: Internal Medicine

## 2017-10-16 DIAGNOSIS — R935 Abnormal findings on diagnostic imaging of other abdominal regions, including retroperitoneum: Secondary | ICD-10-CM

## 2017-10-16 DIAGNOSIS — D734 Cyst of spleen: Secondary | ICD-10-CM | POA: Diagnosis not present

## 2017-10-16 MED ORDER — GADOBENATE DIMEGLUMINE 529 MG/ML IV SOLN
8.0000 mL | Freq: Once | INTRAVENOUS | Status: AC | PRN
  Administered 2017-10-16: 8 mL via INTRAVENOUS

## 2017-10-16 NOTE — Progress Notes (Signed)
Spleen, liver and kidney cysts all stable Good news No routine repeat imaging needed See me prn

## 2017-12-09 DIAGNOSIS — N400 Enlarged prostate without lower urinary tract symptoms: Secondary | ICD-10-CM | POA: Diagnosis not present

## 2017-12-09 DIAGNOSIS — N183 Chronic kidney disease, stage 3 (moderate): Secondary | ICD-10-CM | POA: Diagnosis not present

## 2017-12-17 DIAGNOSIS — N200 Calculus of kidney: Secondary | ICD-10-CM | POA: Diagnosis not present

## 2017-12-17 DIAGNOSIS — N270 Small kidney, unilateral: Secondary | ICD-10-CM | POA: Diagnosis not present

## 2017-12-17 DIAGNOSIS — N5201 Erectile dysfunction due to arterial insufficiency: Secondary | ICD-10-CM | POA: Diagnosis not present

## 2017-12-17 DIAGNOSIS — N183 Chronic kidney disease, stage 3 (moderate): Secondary | ICD-10-CM | POA: Diagnosis not present

## 2017-12-29 DIAGNOSIS — I2510 Atherosclerotic heart disease of native coronary artery without angina pectoris: Secondary | ICD-10-CM | POA: Diagnosis not present

## 2017-12-29 DIAGNOSIS — E785 Hyperlipidemia, unspecified: Secondary | ICD-10-CM | POA: Diagnosis not present

## 2017-12-29 DIAGNOSIS — N183 Chronic kidney disease, stage 3 (moderate): Secondary | ICD-10-CM | POA: Diagnosis not present

## 2017-12-29 DIAGNOSIS — E119 Type 2 diabetes mellitus without complications: Secondary | ICD-10-CM | POA: Diagnosis not present

## 2017-12-29 DIAGNOSIS — Z794 Long term (current) use of insulin: Secondary | ICD-10-CM | POA: Diagnosis not present

## 2017-12-30 ENCOUNTER — Ambulatory Visit (INDEPENDENT_AMBULATORY_CARE_PROVIDER_SITE_OTHER): Payer: Medicare Other | Admitting: Cardiology

## 2017-12-30 ENCOUNTER — Encounter: Payer: Self-pay | Admitting: Cardiology

## 2017-12-30 VITALS — BP 134/78 | HR 58 | Ht 65.0 in | Wt 195.8 lb

## 2017-12-30 DIAGNOSIS — E7800 Pure hypercholesterolemia, unspecified: Secondary | ICD-10-CM

## 2017-12-30 DIAGNOSIS — I2510 Atherosclerotic heart disease of native coronary artery without angina pectoris: Secondary | ICD-10-CM

## 2017-12-30 DIAGNOSIS — I10 Essential (primary) hypertension: Secondary | ICD-10-CM

## 2017-12-30 NOTE — Patient Instructions (Signed)
Medication Instructions:  Your physician recommends that you continue on your current medications as directed. Please refer to the Current Medication list given to you today.  If you need a refill on your cardiac medications, please contact your pharmacy first.  Labwork: None ordered   Testing/Procedures: None ordered   Follow-Up: Your physician wants you to follow-up in: 1 year with Dr. Turner. You will receive a reminder letter in the mail two months in advance. If you don't receive a letter, please call our office to schedule the follow-up appointment.  Any Other Special Instructions Will Be Listed Below (If Applicable).   Thank you for choosing [facility]    Rena Tram Wrenn, RN  [phone]  If you need a refill on your cardiac medications before your next appointment, please call your pharmacy.   

## 2017-12-30 NOTE — Progress Notes (Signed)
Cardiology Office Note:    Date:  12/30/2017   ID:  Jeffrey Holmes, DOB 09-04-1950, MRN [id number]  PCP:  Nanci Pina, FNP  Cardiologist:  No primary care provider on file.    Referring MD: Nanci Pina, FNP   Chief Complaint  Patient presents with  . Coronary Artery Disease  . Hypertension  . Hyperlipidemia    History of Present Illness:    Jeffrey Holmes is a 67 y.o. 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 He also has a history of HTN, dyslipidemia and CKD stage 3.He is here today for followup and is doing well.  He denies any chest pain or pressure, SOB, DOE, PND, orthopnea, LE edema, dizziness, palpitations or syncope. He is compliant with his meds and is tolerating meds with no SE.    Past Medical History:  Diagnosis Date  . Allergy   . Benign essential HTN 08/08/2015  . CAD (coronary artery disease), native coronary artery 08/28/2015   Severe multivessel ASCAD s/p CABG hybrid with LIMA to LAD and diag and PCI of the RCA.   [name] Chronic back pain    L5-S1  . Chronic kidney disease, stage 3 (HCC)    multiple kidney stones  . Diabetes mellitus without complication ([place])    TYPE 2  . GERD (gastroesophageal reflux disease)    OCC  . H/O hiatal hernia   . Hepatic cyst 11/01/2016  . History of acute renal failure 2015   secondary to stones, ELEVATED CREATININE, NO DIALYSIS DONE  . History of kidney stones   . Hx of adenomatous colonic polyps 06/11/2017  . Hyperlipidemia 08/08/2015  . NAFLD (nonalcoholic fatty liver disease) 11/01/2016  . Splenic cyst 11/01/2016  . Ureteral calculus 01/06/2014    Past Surgical History:  Procedure Laterality Date  . ANGIOPLASTY N/A 08/21/2015   Procedure: ANGIOPLASTY WITH PERCUTANEOUS CORONARY INTERVENTION WITH DES TO RIGHT CORONARY ARTERY;  Surgeon: Jettie Booze, MD;  Location: [place];  Service: Cardiovascular;  Laterality: N/A;  . BACK SURGERY  2006   microdisectomy  L5-S1  . CARDIAC CATHETERIZATION N/A 08/18/2015   Procedure: Left Heart Cath and Coronary Angiography;  Surgeon: Jettie Booze, MD;  Location: [place] [facility];  Service: Cardiovascular;  Laterality: N/A;  . CARDIAC CATHETERIZATION N/A 08/21/2015   Procedure: Coronary Stent Intervention;  Surgeon: Jettie Booze, MD;  Location: [place] [facility];  Service: Cardiovascular;  Laterality: N/A;  . CARDIAC CATHETERIZATION  08/21/2015   Procedure: Bypass Graft Angiography;  Surgeon: Jettie Booze, MD;  Location: [place] [facility];  Service: Cardiovascular;;  . CARDIOVASCULAR STRESS TEST  08/15/2015  . COLONOSCOPY    . CORONARY ARTERY BYPASS GRAFT N/A 08/21/2015   Procedure: OFF PUMP CORONARY ARTERY BYPASS GRAFTING (CABG), TIMES TWO, USING LEFT INTERNAL MAMMARY ARTERY, RIGHT GREATER SAPHENOUS VEIN HARVESTED ENDOSCOPICALLY;  Surgeon: [name], MD;  Location: [place];  Service: Open Heart Surgery;  Laterality: N/A;  . CYSTOSCOPY W/ URETERAL STENT PLACEMENT Right 03/12/2017   Procedure: CYSTOSCOPY WITH RETROGRADE PYELOGRAM/URETERAL STENT PLACEMENT;  Surgeon: Bjorn Loser, MD;  Location: [facility];  Service: Urology;  Laterality: Right;  . CYSTOSCOPY WITH RETROGRADE PYELOGRAM, URETEROSCOPY AND STENT PLACEMENT Right 01/06/2014   Procedure: CYSTOSCOPY WITH RETROGRADE PYELOGRAM, URETEROSCOPY AND STENT PLACEMENT;  Surgeon: Bernestine Amass, MD;  Location: [facility];  Service: Urology;  Laterality: Right;  . CYSTOSCOPY/URETEROSCOPY/HOLMIUM LASER/STENT PLACEMENT Right  03/25/2017   Procedure: RIGHT URETEROSCOPY WITH HOLMIUM LASER STENT EXCHANGE;  Surgeon: [name], MD;  Location: [facility];  Service: Urology;  Laterality: Right;  . ESOPHAGOGASTRODUODENOSCOPY ENDOSCOPY     normal  . LITHOTRIPSY     2-3 times in past  . TEE WITHOUT CARDIOVERSION N/A 08/21/2015   Procedure: TRANSESOPHAGEAL ECHOCARDIOGRAM (TEE);  Surgeon: [name], MD;  Location: [place];  Service: Open Heart Surgery;   Laterality: N/A;  . TONSILLECTOMY    . WISDOM TOOTH EXTRACTION      Current Medications: Current Meds  Medication Sig  . amLODipine (NORVASC) 5 MG tablet Take 1 tablet (5 mg total) by mouth daily.  [name] atorvastatin (LIPITOR) 80 MG tablet TAKE ONE TABLET BY MOUTH ONCE DAILY  . clopidogrel (PLAVIX) 75 MG tablet TAKE ONE TABLET BY MOUTH ONCE DAILY  . Evolocumab (REPATHA SURECLICK) 169 MG/ML SOAJ Inject 1 pen into the skin every 14 (fourteen) days.  . Insulin Glargine (BASAGLAR KWIKPEN) 100 UNIT/ML SOPN Inject 0.2 mLs (20 Units total) into the skin at bedtime.  . insulin lispro (HUMALOG KWIKPEN) 100 UNIT/ML KiwkPen 3 times a day (just before each meal) 15-10-15, and pen needles 4/day (Patient taking differently: Inject 10 Units into the skin 2 (two) times daily. )  . metoprolol succinate (TOPROL-XL) 50 MG 24 hr tablet TAKE ONE TABLET BY MOUTH TWICE DAILY. TAKE WITH OR IMMEDIATELY FOLLOWING A MEAL  . nitroGLYCERIN (NITROSTAT) 0.4 MG SL tablet DISSOLVE ONE TABLET UNDER THE TONGUE EVERY 5 MINUTES AS NEEDED FOR CHEST PAIN.  DO NOT EXCEED A TOTAL OF 3 DOSES IN 15 MINUTES  . ondansetron (ZOFRAN-ODT) 4 MG disintegrating tablet Take 4 mg by mouth every 8 (eight) hours as needed for nausea/vomiting.  . pantoprazole (PROTONIX) 40 MG tablet Take 1 tablet (40 mg total) by mouth daily before breakfast.  . timolol (TIMOPTIC) 0.5 % ophthalmic solution Place 1 drop into both eyes 2 (two) times daily.   . traMADol (ULTRAM) 50 MG tablet Take 1 tablet (50 mg total) by mouth every 6 (six) hours as needed (FOR PAIN).  [name] travoprost, benzalkonium, (TRAVATAN) 0.004 % ophthalmic solution Place 1 drop into both eyes at bedtime.     Allergies:   Vicodin [hydrocodone-acetaminophen]; Hydrocodone; Oxycontin [oxycodone hcl]; and Percocet [oxycodone-acetaminophen]   Social History   Socioeconomic History  . Marital status: Married    Spouse name: Not on file  . Number of children: 1  . Years of education: Not on file  .  Highest education level: Not on file  Occupational History  . Occupation: retired  Scientific laboratory technician  . Financial resource strain: Not on file  . Food insecurity:    Worry: Not on file    Inability: Not on file  . Transportation needs:    Medical: Not on file    Non-medical: Not on file  Tobacco Use  . Smoking status: Never Smoker  . Smokeless tobacco: Never Used  Substance and Sexual Activity  . Alcohol use: Yes    Comment: occasional every 2-3 months  . Drug use: No  . Sexual activity: Not on file  Lifestyle  . Physical activity:    Days per week: Not on file    Minutes per session: Not on file  . Stress: Not on file  Relationships  . Social connections:    Talks on phone: Not on file    Gets together: Not on file    Attends religious service: Not on file    Active member  of club or organization: Not on file    Attends meetings of clubs or organizations: Not on file    Relationship status: Not on file  Other Topics Concern  . Not on file  Social History Narrative  . Not on file     Family History: The patient's family history includes Diabetes in his mother and sister; Heart attack in his mother; Kidney failure in his mother; Throat cancer in his father. There is no history of Colon cancer, Colon polyps, Rectal cancer, or Stomach cancer.  ROS:   Please see the history of present illness.    ROS  All other systems reviewed and negative.   EKGs/Labs/Other Studies Reviewed:    The following studies were reviewed today: none  EKG:  EKG is  ordered today.  The ekg ordered today demonstrates sinus bradycardia 58 bpm with nonspecific ST abnormality.  Recent Labs: 03/12/2017: Hemoglobin 16.2; Platelets 239 03/19/2017: ALT 16; BUN 33; Creatinine, Ser 2.97; Potassium 4.2; Sodium 144   Recent Lipid Panel    Component Value Date/Time   CHOL 94 (L) 08/22/2017 0741   TRIG 156 (H) 08/22/2017 0741   HDL 43 08/22/2017 0741   CHOLHDL 2.2 08/22/2017 0741   CHOLHDL 1.6  07/08/2016 0855   VLDL 37 (H) 07/08/2016 0855   LDLCALC 20 08/22/2017 0741   LDLDIRECT 13 07/08/2016 0855    Physical Exam:    VS:  BP 134/78   Pulse (!) 58   Ht 5\' 5"  (1.651 m)   Wt 195 lb 12.8 oz (88.8 kg)   BMI 32.58 kg/m     Wt Readings from Last 3 Encounters:  12/30/17 195 lb 12.8 oz (88.8 kg)  06/05/17 180 lb (81.6 kg)  04/03/17 180 lb 8 oz (81.9 kg)     GEN:  Well nourished, well developed in no acute distress HEENT: Normal NECK: No JVD; No carotid bruits LYMPHATICS: No lymphadenopathy CARDIAC: RRR, no murmurs, rubs, gallops RESPIRATORY:  Clear to auscultation without rales, wheezing or rhonchi  ABDOMEN: Soft, non-tender, non-distended MUSCULOSKELETAL:  No edema; No deformity  SKIN: Warm and dry NEUROLOGIC:  Alert and oriented x 3 PSYCHIATRIC:  Normal affect   ASSESSMENT:    1. Coronary artery disease involving native coronary artery of native heart without angina pectoris   2. Benign essential HTN   3. Pure hypercholesterolemia    PLAN:    In order of problems listed above:  1.  ASCAD - severe multivessel CAD s/p hybrid procedure with CABG with LIMA to LAD/Diag and PCI of the RCA.  He denies any anginal symptoms.  He will continue on Plavix 75 mg daily, beta-blocker and statin.  2.  Hypertension -BP is well controlled on exam today.  He will continue on Toprol XL 50 mg twice daily and amlodipine 5 mg daily.  3.  Hyperlipidemia with LDL goal less than 70.  He will continue on atorvastatin 80 mg daily.  LDL was 20 on 08/22/2017.   Medication Adjustments/Labs and Tests Ordered: Current medicines are reviewed at length with the patient today.  Concerns regarding medicines are outlined above.  Orders Placed This Encounter  Procedures  . EKG 12-Lead   No orders of the defined types were placed in this encounter.   Signed, Fransico Him, MD  12/30/2017 8:11 AM    [place]

## 2018-01-12 DIAGNOSIS — H1045 Other chronic allergic conjunctivitis: Secondary | ICD-10-CM | POA: Diagnosis not present

## 2018-01-12 DIAGNOSIS — R05 Cough: Secondary | ICD-10-CM | POA: Diagnosis not present

## 2018-01-12 DIAGNOSIS — J3089 Other allergic rhinitis: Secondary | ICD-10-CM | POA: Diagnosis not present

## 2018-01-12 DIAGNOSIS — K219 Gastro-esophageal reflux disease without esophagitis: Secondary | ICD-10-CM | POA: Diagnosis not present

## 2018-01-21 ENCOUNTER — Other Ambulatory Visit: Payer: Self-pay | Admitting: Pharmacist

## 2018-01-21 MED ORDER — EVOLOCUMAB 140 MG/ML ~~LOC~~ SOAJ: 1.0000 "pen " | SUBCUTANEOUS | 11 refills | Status: DC

## 2018-01-22 ENCOUNTER — Other Ambulatory Visit: Payer: Self-pay

## 2018-01-28 ENCOUNTER — Other Ambulatory Visit: Payer: Self-pay | Admitting: Pharmacist

## 2018-01-28 ENCOUNTER — Other Ambulatory Visit: Payer: Self-pay | Admitting: Cardiology

## 2018-01-28 MED ORDER — EVOLOCUMAB 140 MG/ML ~~LOC~~ SOAJ: 1.0000 "pen " | SUBCUTANEOUS | 11 refills | Status: DC

## 2018-02-18 DIAGNOSIS — N135 Crossing vessel and stricture of ureter without hydronephrosis: Secondary | ICD-10-CM | POA: Diagnosis not present

## 2018-02-18 DIAGNOSIS — N261 Atrophy of kidney (terminal): Secondary | ICD-10-CM | POA: Diagnosis not present

## 2018-02-18 DIAGNOSIS — Z6829 Body mass index (BMI) 29.0-29.9, adult: Secondary | ICD-10-CM | POA: Diagnosis not present

## 2018-02-18 DIAGNOSIS — N183 Chronic kidney disease, stage 3 (moderate): Secondary | ICD-10-CM | POA: Diagnosis not present

## 2018-02-18 DIAGNOSIS — N209 Urinary calculus, unspecified: Secondary | ICD-10-CM | POA: Diagnosis not present

## 2018-03-03 DIAGNOSIS — Z794 Long term (current) use of insulin: Secondary | ICD-10-CM | POA: Diagnosis not present

## 2018-03-03 DIAGNOSIS — H52221 Regular astigmatism, right eye: Secondary | ICD-10-CM | POA: Diagnosis not present

## 2018-03-03 DIAGNOSIS — I10 Essential (primary) hypertension: Secondary | ICD-10-CM | POA: Diagnosis not present

## 2018-03-03 DIAGNOSIS — H5203 Hypermetropia, bilateral: Secondary | ICD-10-CM | POA: Diagnosis not present

## 2018-03-03 DIAGNOSIS — H18419 Arcus senilis, unspecified eye: Secondary | ICD-10-CM | POA: Diagnosis not present

## 2018-03-03 DIAGNOSIS — H401131 Primary open-angle glaucoma, bilateral, mild stage: Secondary | ICD-10-CM | POA: Diagnosis not present

## 2018-03-03 DIAGNOSIS — H40013 Open angle with borderline findings, low risk, bilateral: Secondary | ICD-10-CM | POA: Diagnosis not present

## 2018-03-03 DIAGNOSIS — H25099 Other age-related incipient cataract, unspecified eye: Secondary | ICD-10-CM | POA: Diagnosis not present

## 2018-03-03 DIAGNOSIS — E119 Type 2 diabetes mellitus without complications: Secondary | ICD-10-CM | POA: Diagnosis not present

## 2018-03-03 DIAGNOSIS — H524 Presbyopia: Secondary | ICD-10-CM | POA: Diagnosis not present

## 2018-03-03 DIAGNOSIS — H11159 Pinguecula, unspecified eye: Secondary | ICD-10-CM | POA: Diagnosis not present

## 2018-03-17 DIAGNOSIS — H25093 Other age-related incipient cataract, bilateral: Secondary | ICD-10-CM | POA: Diagnosis not present

## 2018-03-17 DIAGNOSIS — H401132 Primary open-angle glaucoma, bilateral, moderate stage: Secondary | ICD-10-CM | POA: Diagnosis not present

## 2018-04-02 DIAGNOSIS — I2510 Atherosclerotic heart disease of native coronary artery without angina pectoris: Secondary | ICD-10-CM | POA: Diagnosis not present

## 2018-04-02 DIAGNOSIS — E785 Hyperlipidemia, unspecified: Secondary | ICD-10-CM | POA: Diagnosis not present

## 2018-04-02 DIAGNOSIS — E119 Type 2 diabetes mellitus without complications: Secondary | ICD-10-CM | POA: Diagnosis not present

## 2018-04-02 DIAGNOSIS — Z794 Long term (current) use of insulin: Secondary | ICD-10-CM | POA: Diagnosis not present

## 2018-04-02 DIAGNOSIS — N183 Chronic kidney disease, stage 3 (moderate): Secondary | ICD-10-CM | POA: Diagnosis not present

## 2018-04-28 ENCOUNTER — Other Ambulatory Visit: Payer: Self-pay | Admitting: Cardiology

## 2018-05-25 DIAGNOSIS — E1122 Type 2 diabetes mellitus with diabetic chronic kidney disease: Secondary | ICD-10-CM | POA: Diagnosis not present

## 2018-05-25 DIAGNOSIS — N183 Chronic kidney disease, stage 3 (moderate): Secondary | ICD-10-CM | POA: Diagnosis not present

## 2018-07-06 DIAGNOSIS — I2510 Atherosclerotic heart disease of native coronary artery without angina pectoris: Secondary | ICD-10-CM | POA: Diagnosis not present

## 2018-07-06 DIAGNOSIS — Z794 Long term (current) use of insulin: Secondary | ICD-10-CM | POA: Diagnosis not present

## 2018-07-06 DIAGNOSIS — E785 Hyperlipidemia, unspecified: Secondary | ICD-10-CM | POA: Diagnosis not present

## 2018-07-06 DIAGNOSIS — E119 Type 2 diabetes mellitus without complications: Secondary | ICD-10-CM | POA: Diagnosis not present

## 2018-07-06 DIAGNOSIS — N183 Chronic kidney disease, stage 3 (moderate): Secondary | ICD-10-CM | POA: Diagnosis not present

## 2018-08-03 DIAGNOSIS — Z79899 Other long term (current) drug therapy: Secondary | ICD-10-CM | POA: Diagnosis not present

## 2018-08-03 DIAGNOSIS — R7989 Other specified abnormal findings of blood chemistry: Secondary | ICD-10-CM | POA: Diagnosis not present

## 2018-08-19 DIAGNOSIS — E119 Type 2 diabetes mellitus without complications: Secondary | ICD-10-CM | POA: Diagnosis not present

## 2018-09-17 DIAGNOSIS — N184 Chronic kidney disease, stage 4 (severe): Secondary | ICD-10-CM | POA: Diagnosis not present

## 2018-09-17 DIAGNOSIS — E669 Obesity, unspecified: Secondary | ICD-10-CM | POA: Diagnosis not present

## 2018-09-17 DIAGNOSIS — N209 Urinary calculus, unspecified: Secondary | ICD-10-CM | POA: Diagnosis not present

## 2018-09-17 DIAGNOSIS — I129 Hypertensive chronic kidney disease with stage 1 through stage 4 chronic kidney disease, or unspecified chronic kidney disease: Secondary | ICD-10-CM | POA: Diagnosis not present

## 2018-09-17 DIAGNOSIS — N183 Chronic kidney disease, stage 3 (moderate): Secondary | ICD-10-CM | POA: Diagnosis not present

## 2018-10-19 DIAGNOSIS — N183 Chronic kidney disease, stage 3 (moderate): Secondary | ICD-10-CM | POA: Diagnosis not present

## 2018-10-19 DIAGNOSIS — N200 Calculus of kidney: Secondary | ICD-10-CM | POA: Diagnosis not present

## 2018-10-19 DIAGNOSIS — Z125 Encounter for screening for malignant neoplasm of prostate: Secondary | ICD-10-CM | POA: Diagnosis not present

## 2018-11-06 ENCOUNTER — Other Ambulatory Visit: Payer: Self-pay | Admitting: Nurse Practitioner

## 2018-11-23 DIAGNOSIS — E119 Type 2 diabetes mellitus without complications: Secondary | ICD-10-CM | POA: Diagnosis not present

## 2018-11-23 DIAGNOSIS — N183 Chronic kidney disease, stage 3 (moderate): Secondary | ICD-10-CM | POA: Diagnosis not present

## 2018-11-23 DIAGNOSIS — I2510 Atherosclerotic heart disease of native coronary artery without angina pectoris: Secondary | ICD-10-CM | POA: Diagnosis not present

## 2018-11-23 DIAGNOSIS — E785 Hyperlipidemia, unspecified: Secondary | ICD-10-CM | POA: Diagnosis not present

## 2018-11-23 DIAGNOSIS — Z794 Long term (current) use of insulin: Secondary | ICD-10-CM | POA: Diagnosis not present

## 2018-12-25 ENCOUNTER — Telehealth: Payer: Self-pay

## 2018-12-25 NOTE — Telephone Encounter (Signed)

## 2018-12-27 NOTE — Progress Notes (Signed)
Virtual Visit via Telephone Note   This visit type was conducted due to national recommendations for restrictions regarding the COVID-19 Pandemic (e.g. social distancing) in an effort to limit this patient's exposure and mitigate transmission in our community.  Due to his co-morbid illnesses, this patient is at least at moderate risk for complications without adequate follow up.  This format is felt to be most appropriate for this patient at this time.  The patient did not have access to video technology/had technical difficulties with video requiring transitioning to audio format only (telephone).  All issues noted in this document were discussed and addressed.  No physical exam could be performed with this format.  Please refer to the patient's chart for his  consent to telehealth for [facility].  Evaluation Performed:  Follow-up visit  This visit type was conducted due to national recommendations for restrictions regarding the COVID-19 Pandemic (e.g. social distancing).  This format is felt to be most appropriate for this patient at this time.  All issues noted in this document were discussed and addressed.  No physical exam was performed (except for noted visual exam findings with Video Visits).  Please refer to the patient's chart (MyChart message for video visits and phone note for telephone visits) for the patient's consent to telehealth for [facility]  Date:  12/28/2018   ID:  Jeffrey Holmes, DOB 1951-04-21, MRN [id number]  Patient Location:  Home  Provider location:   [place]  PCP:  Suella Broad, FNP  Cardiologist:  Fransico Him, MD Electrophysiologist:  None   Chief Complaint:  CAD, HTN, Hyperlipidemia  History of Present Illness:    Jeffrey Holmes is a 68 y.o. male who presents via audio/video conferencing for a telehealth visit today.    Jeffrey Holmes is a 68 y.o. 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 He also has a history of HTN, dyslipidemia andCKD stage 3.  He is here today for followup and is doing well.  He denies any chest pain or pressure,PND, orthopnea, dizziness, palpitations or syncope. Occasionally he will have some mild LE edema at times.  He has mild DOE at times but is able to walk 2 miles daily.  He says that the first 1/4 mile is when he notices the DOE going up hills but then his SOB resolves.  He says that he does not feel SOB but his wife notices that he he breathes heavy when he is carrying groceries.   He is compliant with his meds and is tolerating meds with no SE.    The patient does not have symptoms concerning for COVID-19 infection (fever, chills, cough, or new shortness of breath).    Prior CV studies:   The following studies were reviewed today:  None  Past Medical History:  Diagnosis Date  . Allergy   . Benign essential HTN 08/08/2015  . CAD (coronary artery disease), native coronary artery 08/28/2015   Severe multivessel ASCAD s/p CABG hybrid with LIMA to LAD and diag and PCI of the RCA.   [name] Chronic back pain    L5-S1  . Chronic kidney disease, stage 3 (HCC)    multiple kidney stones  . Diabetes mellitus without complication ([place])    TYPE 2  . GERD (gastroesophageal reflux disease)    OCC  . H/O hiatal hernia   . Hepatic cyst 11/01/2016  . History of acute  renal failure 2015   secondary to stones, ELEVATED CREATININE, NO DIALYSIS DONE  . History of kidney stones   . Hx of adenomatous colonic polyps 06/11/2017  . Hyperlipidemia 08/08/2015  . NAFLD (nonalcoholic fatty liver disease) 11/01/2016  . Splenic cyst 11/01/2016  . Ureteral calculus 01/06/2014   Past Surgical History:  Procedure Laterality Date  . ANGIOPLASTY N/A 08/21/2015   Procedure: ANGIOPLASTY WITH PERCUTANEOUS CORONARY INTERVENTION WITH DES TO RIGHT CORONARY ARTERY;  Surgeon: Jettie Booze, MD;  Location: [place];  Service: Cardiovascular;  Laterality: N/A;  . BACK SURGERY  2006    microdisectomy L5-S1  . CARDIAC CATHETERIZATION N/A 08/18/2015   Procedure: Left Heart Cath and Coronary Angiography;  Surgeon: Jettie Booze, MD;  Location: [place] [facility];  Service: Cardiovascular;  Laterality: N/A;  . CARDIAC CATHETERIZATION N/A 08/21/2015   Procedure: Coronary Stent Intervention;  Surgeon: Jettie Booze, MD;  Location: [place] [facility];  Service: Cardiovascular;  Laterality: N/A;  . CARDIAC CATHETERIZATION  08/21/2015   Procedure: Bypass Graft Angiography;  Surgeon: Jettie Booze, MD;  Location: [place] [facility];  Service: Cardiovascular;;  . CARDIOVASCULAR STRESS TEST  08/15/2015  . COLONOSCOPY    . CORONARY ARTERY BYPASS GRAFT N/A 08/21/2015   Procedure: OFF PUMP CORONARY ARTERY BYPASS GRAFTING (CABG), TIMES TWO, USING LEFT INTERNAL MAMMARY ARTERY, RIGHT GREATER SAPHENOUS VEIN HARVESTED ENDOSCOPICALLY;  Surgeon: [name], MD;  Location: [place];  Service: Open Heart Surgery;  Laterality: N/A;  . CYSTOSCOPY W/ URETERAL STENT PLACEMENT Right 03/12/2017   Procedure: CYSTOSCOPY WITH RETROGRADE PYELOGRAM/URETERAL STENT PLACEMENT;  Surgeon: Bjorn Loser, MD;  Location: [facility];  Service: Urology;  Laterality: Right;  . CYSTOSCOPY WITH RETROGRADE PYELOGRAM, URETEROSCOPY AND STENT PLACEMENT Right 01/06/2014   Procedure: CYSTOSCOPY WITH RETROGRADE PYELOGRAM, URETEROSCOPY AND STENT PLACEMENT;  Surgeon: Bernestine Amass, MD;  Location: [facility];  Service: Urology;  Laterality: Right;  . CYSTOSCOPY/URETEROSCOPY/HOLMIUM LASER/STENT PLACEMENT Right 03/25/2017   Procedure: RIGHT URETEROSCOPY WITH HOLMIUM LASER STENT EXCHANGE;  Surgeon: [name], MD;  Location: [facility];  Service: Urology;  Laterality: Right;  . ESOPHAGOGASTRODUODENOSCOPY ENDOSCOPY     normal  . LITHOTRIPSY     2-3 times in past  . TEE WITHOUT CARDIOVERSION N/A 08/21/2015   Procedure: TRANSESOPHAGEAL ECHOCARDIOGRAM (TEE);  Surgeon: [name], MD;  Location: [place];  Service: Open Heart  Surgery;  Laterality: N/A;  . TONSILLECTOMY    . WISDOM TOOTH EXTRACTION       Current Meds  Medication Sig  . amLODipine (NORVASC) 10 MG tablet Take 10 mg by mouth daily.  [name] atorvastatin (LIPITOR) 80 MG tablet TAKE 1 TABLET BY MOUTH ONCE DAILY  . clopidogrel (PLAVIX) 75 MG tablet TAKE 1 TABLET BY MOUTH ONCE DAILY  . Insulin Glargine (BASAGLAR KWIKPEN) 100 UNIT/ML SOPN Inject 0.2 mLs (20 Units total) into the skin at bedtime.  . insulin lispro (HUMALOG) 100 UNIT/ML injection Inject 5-15 Units into the skin as needed for high blood sugar. 5-15 on sliding scale  . loratadine (CLARITIN) 10 MG tablet Take 10 mg by mouth daily.  . metoprolol succinate (TOPROL-XL) 50 MG 24 hr tablet Take 1 tablet (50 mg total) by mouth 2 (two) times daily. Pt must keep upcoming appt to continuing getting refills  . nitroGLYCERIN (NITROSTAT) 0.4 MG SL tablet DISSOLVE ONE TABLET UNDER THE TONGUE EVERY 5 MINUTES AS NEEDED FOR CHEST PAIN.  DO NOT EXCEED A TOTAL OF 3 DOSES IN 15 MINUTES  . pantoprazole (PROTONIX)  40 MG tablet Take 1 tablet (40 mg total) by mouth daily before breakfast.  . timolol (TIMOPTIC) 0.5 % ophthalmic solution Place 1 drop into both eyes 2 (two) times daily.   . traMADol (ULTRAM) 50 MG tablet Take 1 tablet (50 mg total) by mouth every 6 (six) hours as needed (FOR PAIN).  [name] travoprost, benzalkonium, (TRAVATAN) 0.004 % ophthalmic solution Place 1 drop into both eyes at bedtime.  [name] VICTOZA 18 MG/3ML SOPN Inject 1.2 mg into the skin daily.  . Vitamin D, Ergocalciferol, (DRISDOL) 1.25 MG (50000 UT) CAPS capsule   . [DISCONTINUED] amLODipine (NORVASC) 5 MG tablet Take 1 tablet (5 mg total) by mouth daily.     Allergies:   Vicodin [hydrocodone-acetaminophen]; Hydrocodone; Oxycontin [oxycodone hcl]; and Percocet [oxycodone-acetaminophen]   Social History   Tobacco Use  . Smoking status: Never Smoker  . Smokeless tobacco: Never Used  Substance Use Topics  . Alcohol use: Yes    Comment: occasional  every 2-3 months  . Drug use: No     Family Hx: The patient's family history includes Diabetes in his mother and sister; Heart attack in his mother; Kidney failure in his mother; Throat cancer in his father. There is no history of Colon cancer, Colon polyps, Rectal cancer, or Stomach cancer.  ROS:   Please see the history of present illness.     All other systems reviewed and are negative.   Labs/Other Tests and Data Reviewed:    Recent Labs: No results found for requested labs within last 8760 hours.   Recent Lipid Panel Lab Results  Component Value Date/Time   CHOL 94 (L) 08/22/2017 07:41 AM   TRIG 156 (H) 08/22/2017 07:41 AM   HDL 43 08/22/2017 07:41 AM   CHOLHDL 2.2 08/22/2017 07:41 AM   CHOLHDL 1.6 07/08/2016 08:55 AM   LDLCALC 20 08/22/2017 07:41 AM   LDLDIRECT 13 07/08/2016 08:55 AM    Wt Readings from Last 3 Encounters:  12/30/17 195 lb 12.8 oz (88.8 kg)  06/05/17 180 lb (81.6 kg)  04/03/17 180 lb 8 oz (81.9 kg)     Objective:    Vital Signs:  BP (!) 162/85 (BP Location: Left Arm, Patient Position: Sitting, Cuff Size: Normal)   Pulse 60   Ht 5\' 5"  (1.651 m)   BMI 32.58 kg/m     ASSESSMENT & PLAN:    1.  ASCAD - severe multivessel CAD s/p hybrid procedure with CABG with LIMA to LAD/Diag and PCI of the RCA.  He has not had any anginal chest pain symptoms but occasionally he will have some DOE when walking up hills and carrying groceries.  I will get a Lexiscan myoview to rule out ischemia.  He will continue on  Plavix 75mg  daily, BB and statin.   2.  Hypertension - his BP is borderline controlled. He says that it has been running like this for a while.  This may be the etiology of his DOE.  He will continue on Toprol 50mg  BID and increase amlodipine to 10mg  daily.   I have asked him to check his BP daily for a week and call with results.    3.  Hyperlipidemia - his LDL goal is < 70.  He will continue on atorvastatin 80mg  daily.  He has had some problems with  getting his Repatha at a lower cost and has not been able to afford it in some time.  I will refer him back to lipid clinic.    4.  COVID-19 Education:The signs and symptoms of COVID-19 were discussed with the patient and how to seek care for testing (follow up with PCP or arrange E-visit).  The importance of social distancing was discussed today.  Patient Risk:   After full review of this patient's clinical status, I feel that they are at least moderate risk at this time.  Time:   Today, I have spent 15 minutes directly with the patient on telephone discussing medical problems including CAD, HTN, lipids, SOB.  We also reviewed the symptoms of COVID 19 and the ways to protect against contracting the virus with telehealth technology.  I spent an additional 5 minutes reviewing patient's chart including labs.  Medication Adjustments/Labs and Tests Ordered: Current medicines are reviewed at length with the patient today.  Concerns regarding medicines are outlined above.  Tests Ordered: No orders of the defined types were placed in this encounter.  Medication Changes: No orders of the defined types were placed in this encounter.   Disposition:  Follow up in 1 year(s)  Signed, Fransico Him, MD  12/28/2018 8:04 AM    [facility]

## 2018-12-28 ENCOUNTER — Telehealth (INDEPENDENT_AMBULATORY_CARE_PROVIDER_SITE_OTHER): Payer: Medicare Other | Admitting: Cardiology

## 2018-12-28 ENCOUNTER — Other Ambulatory Visit: Payer: Self-pay

## 2018-12-28 ENCOUNTER — Telehealth: Payer: Self-pay | Admitting: Pharmacist

## 2018-12-28 ENCOUNTER — Encounter: Payer: Self-pay | Admitting: Cardiology

## 2018-12-28 VITALS — BP 162/85 | HR 60 | Ht 65.0 in

## 2018-12-28 DIAGNOSIS — I10 Essential (primary) hypertension: Secondary | ICD-10-CM

## 2018-12-28 DIAGNOSIS — I2510 Atherosclerotic heart disease of native coronary artery without angina pectoris: Secondary | ICD-10-CM | POA: Diagnosis not present

## 2018-12-28 DIAGNOSIS — R0602 Shortness of breath: Secondary | ICD-10-CM

## 2018-12-28 DIAGNOSIS — E7800 Pure hypercholesterolemia, unspecified: Secondary | ICD-10-CM | POA: Diagnosis not present

## 2018-12-28 MED ORDER — AMLODIPINE BESYLATE 10 MG PO TABS: 10.0000 mg | ORAL_TABLET | Freq: Every day | ORAL | 3 refills | Status: DC

## 2018-12-28 NOTE — Telephone Encounter (Signed)
Need to restart Repatha per Dr. Radford Pax.   The note mentions cost - checked formulary and Repatha is preferred over Praluent.   Spoke with patient who states that he previously was getting Repatha with help of copay card, but that when card expired he attempted to obtain a new card and was told by [employer] that he did not qualify for using the copay card.   Advised that I would obtain coverage again through insurance to determine if copay has come down any and that at that time we can see about additional coverage if needed ([employer]). I will reach out to [employer] reps to see if pt is in fact not a candidate for copay card.

## 2018-12-28 NOTE — Patient Instructions (Addendum)
Medication Instructions:  Increase: Amlodipine to 10 mg, daily, by mouth  If you need a refill on your cardiac medications before your next appointment, please call your pharmacy.   Lab work: None If you have labs (blood work) drawn today and your tests are completely normal, you will receive your results only by: [name] MyChart Message (if you have MyChart) OR . A paper copy in the mail If you have any lab test that is abnormal or we need to change your treatment, we will call you to review the results.  Testing/Procedures: Your physician has requested that you have a lexiscan myoview. For further information please visit [email or web address]. Please follow instruction sheet, as given.  Follow-Up: At [facility], you and your health needs are our priority.  As part of our continuing mission to provide you with exceptional heart care, we have created designated Provider Care Teams.  These Care Teams include your primary Cardiologist (physician) and Advanced Practice Providers (APPs -  Physician Assistants and Nurse Practitioners) who all work together to provide you with the care you need, when you need it. You will need a follow up appointment in 1 years.  Please call our office 2 months in advance to schedule this appointment.  You may see Dr. Radford Pax or one of the following Advanced Practice Providers on your designated Care Team:   [place], PA-C Melina Copa, PA-C . Ermalinda Barrios, PA-C  Any Other Special Instructions Will Be Listed Below (If Applicable).  Check blood pressure, 2 hours after taking your medication for 1 week and call our office with the results.   STRESS TEST INSTRUCTIONS  You are scheduled for a Myocardial Perfusion Imaging Study. Please arrive 15 minutes prior to your appointment time for registration and insurance purposes.  The test will take approximately 3 to 4 hours to complete; you may bring reading material.  If someone comes with you to your  appointment, they will need to remain in the main lobby due to limited space in the testing area. **If you are pregnant or breastfeeding, please notify the nuclear lab prior to your appointment**  How to prepare for your Myocardial Perfusion Test: . Do not eat or drink 3 hours prior to your test, except you may have water. . Do not consume products containing caffeine (regular or decaffeinated) 12 hours prior to your test. (ex: coffee, chocolate, sodas, tea). . Do wear comfortable clothes (no dresses or overalls) and walking shoes, tennis shoes preferred (No heels or open toe shoes are allowed). . Do NOT wear cologne, perfume, aftershave, or lotions (deodorant is allowed). . If these instructions are not followed, your test will have to be rescheduled.  Please report to [address], [address] for your test.  If you have questions or concerns about your appointment, you can call the Nuclear Lab at [phone].  If you cannot keep your appointment, please provide 24 hours notification to the Nuclear Lab, to avoid a possible $50 charge to your account.

## 2018-12-29 ENCOUNTER — Telehealth (HOSPITAL_COMMUNITY): Payer: Self-pay

## 2018-12-29 NOTE — Telephone Encounter (Signed)
Clarified that EXCLUSIONS from copay card are Medicare, Medicare Advantage, Medicare part D, Medicaid, [employer], [employer], DOD or Tricare. Will clarify that pt does not meet the above and if not should qualify for copay card to reduce cost of medication.

## 2018-12-29 NOTE — Telephone Encounter (Signed)
Left instructions on the patient's answering machine. Asked him to call us back with any questions. S.Jermery Caratachea EMTP

## 2018-12-30 ENCOUNTER — Ambulatory Visit: Payer: Medicare Other | Admitting: Cardiology

## 2018-12-31 ENCOUNTER — Other Ambulatory Visit: Payer: Self-pay

## 2018-12-31 ENCOUNTER — Encounter (HOSPITAL_COMMUNITY): Payer: Self-pay

## 2018-12-31 ENCOUNTER — Ambulatory Visit (HOSPITAL_COMMUNITY): Payer: Medicare Other | Attending: Cardiovascular Disease

## 2018-12-31 DIAGNOSIS — R0602 Shortness of breath: Secondary | ICD-10-CM | POA: Insufficient documentation

## 2018-12-31 LAB — MYOCARDIAL PERFUSION IMAGING
LV dias vol: 79 mL (ref 62–150)
LV sys vol: 29 mL
Peak HR: 75 {beats}/min
Rest HR: 58 {beats}/min
SDS: 0
SRS: 0
SSS: 0
TID: 1.02

## 2018-12-31 MED ORDER — REGADENOSON 0.4 MG/5ML IV SOLN
0.4000 mg | Freq: Once | INTRAVENOUS | Status: AC
  Administered 2018-12-31: 0.4 mg via INTRAVENOUS

## 2018-12-31 MED ORDER — TECHNETIUM TC 99M TETROFOSMIN IV KIT
10.9000 | PACK | Freq: Once | INTRAVENOUS | Status: AC | PRN
  Administered 2018-12-31: 10.9 via INTRAVENOUS
  Filled 2018-12-31: qty 11

## 2018-12-31 MED ORDER — TECHNETIUM TC 99M TETROFOSMIN IV KIT
32.3000 | PACK | Freq: Once | INTRAVENOUS | Status: AC | PRN
  Administered 2018-12-31: 32.3 via INTRAVENOUS
  Filled 2018-12-31: qty 33

## 2019-01-06 ENCOUNTER — Other Ambulatory Visit: Payer: Self-pay | Admitting: Pediatric Intensive Care

## 2019-01-06 DIAGNOSIS — Z20822 Contact with and (suspected) exposure to covid-19: Secondary | ICD-10-CM

## 2019-01-07 LAB — NOVEL CORONAVIRUS, NAA: SARS-CoV-2, NAA: NOT DETECTED

## 2019-01-11 ENCOUNTER — Other Ambulatory Visit: Payer: Self-pay | Admitting: Cardiology

## 2019-01-11 MED ORDER — REPATHA SURECLICK 140 MG/ML ~~LOC~~ SOAJ: 1.0000 "pen " | SUBCUTANEOUS | 11 refills | Status: DC

## 2019-01-11 NOTE — Telephone Encounter (Addendum)
Prior auth sent 6/1 and again on 6/15. Copay card reactivated, [facility] stated they are not able to fill - will likely need specialty pharmacy again.

## 2019-01-11 NOTE — Telephone Encounter (Signed)
[place] is calling in reference to discount card for repatha. The one that the patient has expired and they need a new code. Please call to discuss.

## 2019-01-12 NOTE — Telephone Encounter (Addendum)
Tried to reactivate copay card online, was advised that pt will need to call [phone]. Still awaiting prior authorization approval, then will notify pt to call [facility] to reactivate copay card.

## 2019-01-13 NOTE — Telephone Encounter (Addendum)
Reauthorization approved through 01/11/20. See other phone note from 6/15.

## 2019-01-13 NOTE — Telephone Encounter (Signed)
Prior authorization approved. Copay $132 with just insurance coverage. Called [facility] and was able to reactivate copay card, now good through 01/12/20. Called back to pharmacy, approval has not updated in their system yet. They will keep trying to process rx today.

## 2019-01-14 MED ORDER — REPATHA SURECLICK 140 MG/ML ~~LOC~~ SOAJ: 1.0000 "pen " | SUBCUTANEOUS | 11 refills | Status: DC

## 2019-01-14 NOTE — Addendum Note (Signed)
Addended by: Marcelle Overlie D on: 01/14/2019 04:36 PM   Modules accepted: Orders

## 2019-01-14 NOTE — Telephone Encounter (Addendum)
When [facility] ran rx with copay card came back as no retail fills left. Called [facility] who states that that is a rejection from insurance and pt needs to fill with specialty pharmacy. Rx sent to [facility] w/ copay card info.  Called [facility] to confirm that rx copay carded worked. Left message

## 2019-01-15 NOTE — Telephone Encounter (Signed)
Called patient to update him on the status of Repatha. Left VM to return call. PA approved, good through 01/17/20. Attempted to fill at local pharmacy, but appears has to be filled at specialty. [facility] along with copay card. [facility]ed to [facility] to verify that reactived copay card worked, left message but no response yet. Patient should verify cost is $5 before they ship.

## 2019-01-18 ENCOUNTER — Telehealth: Payer: Self-pay

## 2019-01-18 DIAGNOSIS — R05 Cough: Secondary | ICD-10-CM | POA: Diagnosis not present

## 2019-01-18 DIAGNOSIS — J3089 Other allergic rhinitis: Secondary | ICD-10-CM | POA: Diagnosis not present

## 2019-01-18 DIAGNOSIS — K219 Gastro-esophageal reflux disease without esophagitis: Secondary | ICD-10-CM | POA: Diagnosis not present

## 2019-01-18 DIAGNOSIS — H1045 Other chronic allergic conjunctivitis: Secondary | ICD-10-CM | POA: Diagnosis not present

## 2019-01-18 NOTE — Telephone Encounter (Signed)
Called and lmomed the pt regarding the rx for repatha being sent to [facility] and that we reactivated their copay card. Instructed the pt to call the pharmacy to run a test claim to make sure that the copay card works so that the cost isn't so expensive. Left a callback # incase pt has ?'s.

## 2019-02-04 DIAGNOSIS — I10 Essential (primary) hypertension: Secondary | ICD-10-CM | POA: Diagnosis not present

## 2019-02-04 DIAGNOSIS — Z0000 Encounter for general adult medical examination without abnormal findings: Secondary | ICD-10-CM | POA: Diagnosis not present

## 2019-02-04 DIAGNOSIS — Z79899 Other long term (current) drug therapy: Secondary | ICD-10-CM | POA: Diagnosis not present

## 2019-02-04 DIAGNOSIS — E782 Mixed hyperlipidemia: Secondary | ICD-10-CM | POA: Diagnosis not present

## 2019-02-04 DIAGNOSIS — E1122 Type 2 diabetes mellitus with diabetic chronic kidney disease: Secondary | ICD-10-CM | POA: Diagnosis not present

## 2019-03-18 DIAGNOSIS — N184 Chronic kidney disease, stage 4 (severe): Secondary | ICD-10-CM | POA: Diagnosis not present

## 2019-03-22 DIAGNOSIS — N261 Atrophy of kidney (terminal): Secondary | ICD-10-CM | POA: Diagnosis not present

## 2019-03-22 DIAGNOSIS — R809 Proteinuria, unspecified: Secondary | ICD-10-CM | POA: Diagnosis not present

## 2019-03-22 DIAGNOSIS — N183 Chronic kidney disease, stage 3 (moderate): Secondary | ICD-10-CM | POA: Diagnosis not present

## 2019-03-22 DIAGNOSIS — E669 Obesity, unspecified: Secondary | ICD-10-CM | POA: Diagnosis not present

## 2019-03-22 DIAGNOSIS — E559 Vitamin D deficiency, unspecified: Secondary | ICD-10-CM | POA: Diagnosis not present

## 2019-03-22 DIAGNOSIS — Z1159 Encounter for screening for other viral diseases: Secondary | ICD-10-CM | POA: Diagnosis not present

## 2019-03-22 DIAGNOSIS — I129 Hypertensive chronic kidney disease with stage 1 through stage 4 chronic kidney disease, or unspecified chronic kidney disease: Secondary | ICD-10-CM | POA: Diagnosis not present

## 2019-03-31 DIAGNOSIS — E119 Type 2 diabetes mellitus without complications: Secondary | ICD-10-CM | POA: Diagnosis not present

## 2019-03-31 DIAGNOSIS — E785 Hyperlipidemia, unspecified: Secondary | ICD-10-CM | POA: Diagnosis not present

## 2019-03-31 DIAGNOSIS — I2510 Atherosclerotic heart disease of native coronary artery without angina pectoris: Secondary | ICD-10-CM | POA: Diagnosis not present

## 2019-03-31 DIAGNOSIS — N183 Chronic kidney disease, stage 3 (moderate): Secondary | ICD-10-CM | POA: Diagnosis not present

## 2019-03-31 DIAGNOSIS — E782 Mixed hyperlipidemia: Secondary | ICD-10-CM | POA: Diagnosis not present

## 2019-03-31 DIAGNOSIS — Z794 Long term (current) use of insulin: Secondary | ICD-10-CM | POA: Diagnosis not present

## 2019-04-05 ENCOUNTER — Other Ambulatory Visit: Payer: Self-pay | Admitting: Cardiology

## 2019-04-15 DIAGNOSIS — Z1159 Encounter for screening for other viral diseases: Secondary | ICD-10-CM | POA: Diagnosis not present

## 2019-04-15 DIAGNOSIS — I129 Hypertensive chronic kidney disease with stage 1 through stage 4 chronic kidney disease, or unspecified chronic kidney disease: Secondary | ICD-10-CM | POA: Diagnosis not present

## 2019-05-04 ENCOUNTER — Other Ambulatory Visit: Payer: Self-pay | Admitting: Nurse Practitioner

## 2019-05-07 DIAGNOSIS — N183 Chronic kidney disease, stage 3 unspecified: Secondary | ICD-10-CM | POA: Diagnosis not present

## 2019-05-17 ENCOUNTER — Other Ambulatory Visit: Payer: Self-pay | Admitting: Cardiology

## 2019-05-18 ENCOUNTER — Ambulatory Visit (INDEPENDENT_AMBULATORY_CARE_PROVIDER_SITE_OTHER): Payer: Medicare Other | Admitting: Internal Medicine

## 2019-05-18 ENCOUNTER — Other Ambulatory Visit: Payer: Self-pay

## 2019-05-18 ENCOUNTER — Encounter: Payer: Self-pay | Admitting: Internal Medicine

## 2019-05-18 ENCOUNTER — Other Ambulatory Visit (INDEPENDENT_AMBULATORY_CARE_PROVIDER_SITE_OTHER): Payer: Medicare Other

## 2019-05-18 VITALS — BP 136/70 | HR 76 | Temp 98.4°F | Ht 65.5 in | Wt 187.0 lb

## 2019-05-18 DIAGNOSIS — K7689 Other specified diseases of liver: Secondary | ICD-10-CM

## 2019-05-18 DIAGNOSIS — I2510 Atherosclerotic heart disease of native coronary artery without angina pectoris: Secondary | ICD-10-CM

## 2019-05-18 DIAGNOSIS — R1013 Epigastric pain: Secondary | ICD-10-CM

## 2019-05-18 DIAGNOSIS — K429 Umbilical hernia without obstruction or gangrene: Secondary | ICD-10-CM | POA: Diagnosis not present

## 2019-05-18 DIAGNOSIS — R197 Diarrhea, unspecified: Secondary | ICD-10-CM

## 2019-05-18 DIAGNOSIS — D7389 Other diseases of spleen: Secondary | ICD-10-CM | POA: Diagnosis not present

## 2019-05-18 DIAGNOSIS — E1165 Type 2 diabetes mellitus with hyperglycemia: Secondary | ICD-10-CM

## 2019-05-18 LAB — COMPREHENSIVE METABOLIC PANEL
ALT: 22 U/L (ref 0–53)
AST: 13 U/L (ref 0–37)
Albumin: 4.5 g/dL (ref 3.5–5.2)
Alkaline Phosphatase: 109 U/L (ref 39–117)
BUN: 24 mg/dL — ABNORMAL HIGH (ref 6–23)
CO2: 27 mEq/L (ref 19–32)
Calcium: 9.9 mg/dL (ref 8.4–10.5)
Chloride: 100 mEq/L (ref 96–112)
Creatinine, Ser: 1.94 mg/dL — ABNORMAL HIGH (ref 0.40–1.50)
GFR: 41.65 mL/min — ABNORMAL LOW (ref >60.00)
Glucose, Bld: 548 mg/dL (ref 70–99)
Potassium: 3.7 mEq/L (ref 3.5–5.1)
Sodium: 137 mEq/L (ref 135–145)
Total Bilirubin: 0.8 mg/dL (ref 0.2–1.2)
Total Protein: 7.8 g/dL (ref 6.0–8.3)

## 2019-05-18 LAB — CBC WITH DIFFERENTIAL/PLATELET
Basophils Absolute: 0.1 10*3/uL (ref 0.0–0.1)
Basophils Relative: 0.9 % (ref 0.0–3.0)
Eosinophils Absolute: 0.1 10*3/uL (ref 0.0–0.7)
Eosinophils Relative: 1.2 % (ref 0.0–5.0)
HCT: 49.9 % (ref 39.0–52.0)
Hemoglobin: 16.8 g/dL (ref 13.0–17.0)
Lymphocytes Relative: 26.7 % (ref 12.0–46.0)
Lymphs Abs: 2.5 10*3/uL (ref 0.7–4.0)
MCHC: 33.7 g/dL (ref 30.0–36.0)
MCV: 91.6 fl (ref 78.0–100.0)
Monocytes Absolute: 0.7 10*3/uL (ref 0.1–1.0)
Monocytes Relative: 7.7 % (ref 3.0–12.0)
Neutro Abs: 6 10*3/uL (ref 1.4–7.7)
Neutrophils Relative %: 63.5 % (ref 43.0–77.0)
Platelets: 209 10*3/uL (ref 150.0–400.0)
RBC: 5.45 Mil/uL (ref 4.22–5.81)
RDW: 13.9 % (ref 11.5–15.5)
WBC: 9.4 10*3/uL (ref 4.0–10.5)

## 2019-05-18 LAB — LIPASE: Lipase: 127 U/L — ABNORMAL HIGH (ref 11.0–59.0)

## 2019-05-18 LAB — AMYLASE: Amylase: 75 U/L (ref 27–131)

## 2019-05-18 MED ORDER — PANTOPRAZOLE SODIUM 40 MG PO TBEC: 40.0000 mg | DELAYED_RELEASE_TABLET | Freq: Every day | ORAL | 11 refills | Status: DC

## 2019-05-18 NOTE — Patient Instructions (Signed)
Your provider has requested that you go to the basement level for lab work before leaving today. Press "B" on the elevator. The lab is located at the first door on the left as you exit the elevator.  We have given you a handout to read on Umbilical Hernia.  We will contact you about setting up an MRI after we see your lab results.  I appreciate the opportunity to care for you. Silvano Rusk, MD, [facility]

## 2019-05-18 NOTE — Progress Notes (Signed)
Jeffrey Holmes 68 y.o. October 08, 1950 [id number]  Assessment & Plan:   Encounter Diagnoses  Name Primary?  . Dyspepsia Yes  . Diarrhea, unspecified type   . Hepatic cyst   . Uncontrolled type 2 diabetes mellitus with hyperglycemia ([place])   . Splenic lesion   . Umbilical hernia without obstruction and without gangrene     I do not know if this is medication side effect or something more serious.  Clearly the uncontrolled diabetes is having an effect but is something else driving that i.e. a pancreatic tumor.  There were no signs of that on the MRI last year.  His splenic lesion is a bit of a pause although thought to be benign.  Given the constellation of problems lab work is planned as well as an MRI.  Trying to get the MRI scheduled at [facility] but they told us it had to be with and without contrast if done there and the patient prefers to do it there.  With his chronic kidney disease that may not be possible i.e. to do with contrast.  Weight loss is noted that may be due to his hyperglycemia and diabetes out-of-control but the question as above is something else related.  Ultimately he does need to lose weight and treatment of his diseases but now is not the time to address that.  Orders Placed This Encounter  Procedures  . CBC with Differential/Platelet  . Comprehensive metabolic panel  . Amylase  . Lipase   Lab Results  Component Value Date   WBC 9.4 05/18/2019   HGB 16.8 05/18/2019   HCT 49.9 05/18/2019   MCV 91.6 05/18/2019   PLT 209.0 05/18/2019   Lab Results  Component Value Date   CREATININE 1.94 (H) 05/18/2019   BUN 24 (H) 05/18/2019   NA 137 05/18/2019   K 3.7 05/18/2019   CL 100 05/18/2019   CO2 27 05/18/2019   .lastkft Lab Results  Component Value Date   LIPASE 127.0 (H) 05/18/2019   Lab Results  Component Value Date   AMYLASE 75 05/18/2019   Review of the labs suggest to me that the Victoza could be irritating his pancreas though there could be  some other primary problem.  There is an increased risk of pancreatic cancer with that though I do not think he has been on it that long.  I suspect we can have a modified contrast protocol for his MRI.  We will ask him to contact Dr. Buddy Holmes or PCP about severe hyperglycemia   PJ:[id number], Jeffrey Curry, FNP  Subjective:   Chief Complaint: Abdominal discomfort, nausea, history of liver cysts diarrhea  HPI Mr. Jeffrey Holmes reports having had problems over the past several months or longer with a "sensitive stomach" usually in the morning.  There is nausea and an unsettled feeling without overt pain.  This happens most days out of the week.  He wonders if it is related to his insulin.  His glucose has been high of late than it was 420 last month.  Hemoglobin A1c 10.4%.  He is working with Dr. Buddy Holmes of endocrinology, Vick toes is used and the symptoms may correlate with that some though he has decreased the dose and not taking it every week because of that.  And the symptoms persist.  They are not necessarily correlated with the dose of that.  Also reports every couple of months or so will have loose stools to watery stools for a day.  Colonoscopy 2018 with 2  diminutive adenomas diverticulosis and hemorrhoids.  His other GI problems include nonalcoholic fatty liver disease, he has also had hepatic cysts and a splenic cystic lesion.  These were evaluated and followed up with MRI as recent as last year and deemed to be benign appearing and stable.  Pancreas was normal on that MRI as well.  He also has chronic kidney disease.  He reports that he is being careful with the Covid pandemic and maintaining distancing masking etc.  He is eating better and has lost weight he reports.  Wt Readings from Last 3 Encounters:  05/18/19 187 lb (84.8 kg)  12/31/18 200 lb (90.7 kg)  12/30/17 195 lb 12.8 oz (88.8 kg)    Allergies  Allergen Reactions  . Vicodin [Hydrocodone-Acetaminophen] Nausea And Vomiting  . Hydrocodone  Nausea And Vomiting  . Oxycontin [Oxycodone Hcl] Nausea And Vomiting  . Percocet [Oxycodone-Acetaminophen] Nausea And Vomiting   Current Meds  Medication Sig  . amLODipine (NORVASC) 10 MG tablet Take 1 tablet (10 mg total) by mouth daily.  [name] atorvastatin (LIPITOR) 80 MG tablet Take 1 tablet by mouth once daily  . Evolocumab (REPATHA SURECLICK) 001 MG/ML SOAJ Inject 1 pen into the skin every 14 (fourteen) days.  . Insulin Glargine (BASAGLAR KWIKPEN) 100 UNIT/ML SOPN Inject 0.2 mLs (20 Units total) into the skin at bedtime.  . insulin lispro (HUMALOG) 100 UNIT/ML injection Inject 5-15 Units into the skin as needed for high blood sugar. 5-15 on sliding scale  . loratadine (CLARITIN) 10 MG tablet Take 10 mg by mouth daily.  . metoprolol succinate (TOPROL-XL) 50 MG 24 hr tablet Take 1 tablet (50 mg total) by mouth 2 (two) times daily.  . montelukast (SINGULAIR) 10 MG tablet Take 1 tablet by mouth at bedtime.  . nitroGLYCERIN (NITROSTAT) 0.4 MG SL tablet DISSOLVE ONE TABLET UNDER THE TONGUE EVERY 5 MINUTES AS NEEDED FOR CHEST PAIN.  DO NOT EXCEED A TOTAL OF 3 DOSES IN 15 MINUTES  . pantoprazole (PROTONIX) 40 MG tablet Take 1 tablet (40 mg total) by mouth daily before breakfast.  . timolol (TIMOPTIC) 0.5 % ophthalmic solution Place 1 drop into both eyes 2 (two) times daily.   . travoprost, benzalkonium, (TRAVATAN) 0.004 % ophthalmic solution Place 1 drop into both eyes at bedtime.  [name] VICTOZA 18 MG/3ML SOPN Inject 0.6 mg into the skin daily.   . [DISCONTINUED] clopidogrel (PLAVIX) 75 MG tablet TAKE 1 TABLET BY MOUTH ONCE DAILY  . [DISCONTINUED] pantoprazole (PROTONIX) 40 MG tablet Take 1 tablet (40 mg total) by mouth daily before breakfast.   Past Medical History:  Diagnosis Date  . Allergy   . Benign essential HTN 08/08/2015  . CAD (coronary artery disease), native coronary artery 08/28/2015   Severe multivessel ASCAD s/p CABG hybrid with LIMA to LAD and diag and PCI of the RCA.   [name] Chronic back  pain    L5-S1  . Chronic kidney disease, stage 3    multiple kidney stones  . Diabetes mellitus without complication ([place])    TYPE 2  . GERD (gastroesophageal reflux disease)    OCC  . H/O hiatal hernia   . Hepatic cyst 11/01/2016  . History of acute renal failure 2015   secondary to stones, ELEVATED CREATININE, NO DIALYSIS DONE  . History of kidney stones   . Hx of adenomatous colonic polyps 06/11/2017  . Hyperlipidemia 08/08/2015  . NAFLD (nonalcoholic fatty liver disease) 11/01/2016  . Splenic cyst 11/01/2016  . Ureteral calculus 01/06/2014  Past Surgical History:  Procedure Laterality Date  . ANGIOPLASTY N/A 08/21/2015   Procedure: ANGIOPLASTY WITH PERCUTANEOUS CORONARY INTERVENTION WITH DES TO RIGHT CORONARY ARTERY;  Surgeon: Jettie Booze, MD;  Location: [facility];  Service: Cardiovascular;  Laterality: N/A;  . BACK SURGERY  2006   microdisectomy L5-S1  . CARDIAC CATHETERIZATION N/A 08/18/2015   Procedure: Left Heart Cath and Coronary Angiography;  Surgeon: Jettie Booze, MD;  Location: [facility];  Service: Cardiovascular;  Laterality: N/A;  . CARDIAC CATHETERIZATION N/A 08/21/2015   Procedure: Coronary Stent Intervention;  Surgeon: Jettie Booze, MD;  Location: [facility];  Service: Cardiovascular;  Laterality: N/A;  . CARDIAC CATHETERIZATION  08/21/2015   Procedure: Bypass Graft Angiography;  Surgeon: Jettie Booze, MD;  Location: [facility];  Service: Cardiovascular;;  . CARDIOVASCULAR STRESS TEST  08/15/2015  . COLONOSCOPY    . CORONARY ARTERY BYPASS GRAFT N/A 08/21/2015   Procedure: OFF PUMP CORONARY ARTERY BYPASS GRAFTING (CABG), TIMES TWO, USING LEFT INTERNAL MAMMARY ARTERY, RIGHT GREATER SAPHENOUS VEIN HARVESTED ENDOSCOPICALLY;  Surgeon: [name], MD;  Location: [facility];  Service: Open Heart Surgery;  Laterality: N/A;  . CYSTOSCOPY W/ URETERAL STENT PLACEMENT Right 03/12/2017   Procedure: CYSTOSCOPY WITH RETROGRADE  PYELOGRAM/URETERAL STENT PLACEMENT;  Surgeon: Bjorn Loser, MD;  Location: [facility];  Service: Urology;  Laterality: Right;  . CYSTOSCOPY WITH RETROGRADE PYELOGRAM, URETEROSCOPY AND STENT PLACEMENT Right 01/06/2014   Procedure: CYSTOSCOPY WITH RETROGRADE PYELOGRAM, URETEROSCOPY AND STENT PLACEMENT;  Surgeon: Bernestine Amass, MD;  Location: [facility];  Service: Urology;  Laterality: Right;  . CYSTOSCOPY/URETEROSCOPY/HOLMIUM LASER/STENT PLACEMENT Right 03/25/2017   Procedure: RIGHT URETEROSCOPY WITH HOLMIUM LASER STENT EXCHANGE;  Surgeon: [name], MD;  Location: [facility];  Service: Urology;  Laterality: Right;  . ESOPHAGOGASTRODUODENOSCOPY ENDOSCOPY     normal  . LITHOTRIPSY     2-3 times in past  . TEE WITHOUT CARDIOVERSION N/A 08/21/2015   Procedure: TRANSESOPHAGEAL ECHOCARDIOGRAM (TEE);  Surgeon: [name], MD;  Location: [facility];  Service: Open Heart Surgery;  Laterality: N/A;  . TONSILLECTOMY    . WISDOM TOOTH EXTRACTION     Social History   Social History Narrative   Patient is married for the second time.  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  Sometimes works for temporary agencies.   Rare alcohol non-smoker no drug use   family history includes Diabetes in his mother and sister; Heart attack in his mother; Kidney failure in his mother; Throat cancer in his father.   Review of Systems As above  Objective:   Physical Exam BP 136/70   Pulse 76   Temp 98.4 F (36.9 C)   Ht 5' 5.5" (1.664 m)   Wt 187 lb (84.8 kg)   BMI 30.65 kg/m  NAD Anicteric Lungs cta abd obese, soft sl tender umbil hernia which is reducible Alert and oriented x 3  Data reviewed see HPI

## 2019-05-18 NOTE — Progress Notes (Signed)
Severe hyperglycemia - needs to see PCP about this by tomorrow and/or Dr. Buddy Duty of endocrinology  Other labs suggest pancreas irritation ? From Victoza  See if he can have the half-dose contrast protocol for MR abd w/ and w/o contrast as he did last year based upon current creatinine of 1.94 and if so do that (or whatever reduction they would do)

## 2019-05-20 NOTE — Progress Notes (Signed)
Go ahead ans set up the MR abdomen  with and without contrast for original reasons at [facility] scheduler that I spoke to Dr. Amalia Hailey and full dose Gadovist is ok - patient has GFR 41.65

## 2019-05-31 DIAGNOSIS — Z87442 Personal history of urinary calculi: Secondary | ICD-10-CM | POA: Diagnosis not present

## 2019-05-31 DIAGNOSIS — R1032 Left lower quadrant pain: Secondary | ICD-10-CM | POA: Diagnosis not present

## 2019-06-02 DIAGNOSIS — K760 Fatty (change of) liver, not elsewhere classified: Secondary | ICD-10-CM | POA: Diagnosis not present

## 2019-06-02 DIAGNOSIS — N281 Cyst of kidney, acquired: Secondary | ICD-10-CM | POA: Diagnosis not present

## 2019-06-02 DIAGNOSIS — N132 Hydronephrosis with renal and ureteral calculous obstruction: Secondary | ICD-10-CM | POA: Diagnosis not present

## 2019-06-02 DIAGNOSIS — R1084 Generalized abdominal pain: Secondary | ICD-10-CM | POA: Diagnosis not present

## 2019-06-10 DIAGNOSIS — E119 Type 2 diabetes mellitus without complications: Secondary | ICD-10-CM | POA: Diagnosis not present

## 2019-06-10 DIAGNOSIS — N1831 Chronic kidney disease, stage 3a: Secondary | ICD-10-CM | POA: Diagnosis not present

## 2019-06-10 DIAGNOSIS — I2510 Atherosclerotic heart disease of native coronary artery without angina pectoris: Secondary | ICD-10-CM | POA: Diagnosis not present

## 2019-06-10 DIAGNOSIS — E785 Hyperlipidemia, unspecified: Secondary | ICD-10-CM | POA: Diagnosis not present

## 2019-06-10 DIAGNOSIS — Z794 Long term (current) use of insulin: Secondary | ICD-10-CM | POA: Diagnosis not present

## 2019-06-21 ENCOUNTER — Other Ambulatory Visit: Payer: Self-pay

## 2019-06-21 ENCOUNTER — Ambulatory Visit
Admission: RE | Admit: 2019-06-21 | Discharge: 2019-06-21 | Disposition: A | Discharge status: Home / Self Care | Payer: Medicare Other | Source: Ambulatory Visit | Attending: Internal Medicine | Admitting: Internal Medicine

## 2019-06-21 DIAGNOSIS — D7389 Other diseases of spleen: Secondary | ICD-10-CM | POA: Diagnosis not present

## 2019-06-21 DIAGNOSIS — N281 Cyst of kidney, acquired: Secondary | ICD-10-CM | POA: Diagnosis not present

## 2019-06-21 DIAGNOSIS — K7689 Other specified diseases of liver: Secondary | ICD-10-CM

## 2019-06-21 DIAGNOSIS — K760 Fatty (change of) liver, not elsewhere classified: Secondary | ICD-10-CM | POA: Diagnosis not present

## 2019-06-21 MED ORDER — GADOBUTROL 1 MMOL/ML IV SOLN
8.0000 mL | Freq: Once | INTRAVENOUS | Status: AC | PRN
  Administered 2019-06-21: 8 mL via INTRAVENOUS

## 2019-06-22 NOTE — Progress Notes (Signed)
The MRI indicates that the liver and spleen and kidney cysts/lesions are stable and we think no cancer  He needs to keep working on better blood sugar control and he should see me again in January

## 2019-09-27 ENCOUNTER — Ambulatory Visit: Payer: Medicare Other | Attending: Internal Medicine

## 2019-09-27 DIAGNOSIS — Z23 Encounter for immunization: Secondary | ICD-10-CM | POA: Insufficient documentation

## 2019-09-27 NOTE — Progress Notes (Signed)
   Covid-19 Vaccination Clinic  Name:  Jeffrey Holmes    MRN: [id number] DOB: 1951-05-16  09/27/2019  Mr. Boeding was observed post Covid-19 immunization for 15 minutes without incidence. He was provided with Vaccine Information Sheet and instruction to access the V-Safe system.   Mr. Vasek was instructed to call 911 with any severe reactions post vaccine: [name] Difficulty breathing  . Swelling of your face and throat  . A fast heartbeat  . A bad rash all over your body  . Dizziness and weakness    Immunizations Administered    Name Date Dose VIS Date Route   Pfizer COVID-19 Vaccine 09/27/2019  8:24 AM 0.3 mL 07/09/2019 Intramuscular   Manufacturer: [place]   Lot: [id number]   [place]: [address]-7

## 2019-10-04 ENCOUNTER — Telehealth: Payer: Self-pay | Admitting: Cardiology

## 2019-10-04 DIAGNOSIS — R002 Palpitations: Secondary | ICD-10-CM

## 2019-10-04 NOTE — Telephone Encounter (Signed)
New Message  Patient c/o Palpitations:  High priority if patient c/o lightheadedness, shortness of breath, or chest pain  1) How long have you had palpitations/irregular HR/ Afib? Are you having the symptoms now?  2 weeks  2) Are you currently experiencing lightheadedness, SOB or CP? No  3) Do you have a history of afib (atrial fibrillation) or irregular heart rhythm? No  4) Have you checked your BP or HR? (document readings if available): No  5) Are you experiencing any other symptoms? Coming and going palpitations

## 2019-10-04 NOTE — Telephone Encounter (Signed)
Please get 30 day event monitor

## 2019-10-04 NOTE — Telephone Encounter (Signed)
Patient states that he has been having palpitations on and off for the past two weeks. He states that it feels like his heart is floating, but it only last for a few seconds. He denies any lightheadedness, headaches, fainting, n/v, or chest pain. He states that his shortness of breath is at baseline. He does not take his BP often but states that it is usually normal when he does. He confirms that he is taking all of his medications as prescribed .

## 2019-10-05 ENCOUNTER — Encounter: Payer: Self-pay | Admitting: *Deleted

## 2019-10-05 NOTE — Telephone Encounter (Signed)
Spoke with patient who is aware that a 30 day event monitor has been ordered.

## 2019-10-05 NOTE — Progress Notes (Signed)
Patient ID: Jeffrey Holmes, male   DOB: 11/20/1950, 69 y.o.   MRN: [id number] Patient enrolled for [employer] to ship a 30 day cardiac event monitor to his home.

## 2019-10-25 ENCOUNTER — Ambulatory Visit (INDEPENDENT_AMBULATORY_CARE_PROVIDER_SITE_OTHER): Payer: Medicare Other

## 2019-10-25 DIAGNOSIS — R002 Palpitations: Secondary | ICD-10-CM | POA: Diagnosis not present

## 2019-10-26 ENCOUNTER — Ambulatory Visit: Payer: Medicare Other | Attending: Internal Medicine

## 2019-10-26 DIAGNOSIS — Z23 Encounter for immunization: Secondary | ICD-10-CM

## 2019-10-26 NOTE — Progress Notes (Signed)
   Covid-19 Vaccination Clinic  Name:  Jeffrey Holmes    MRN: [id number] DOB: 11/02/1950  10/26/2019  Mr. Jeffrey Holmes was observed post Covid-19 immunization for 15 minutes without incident. He was provided with Vaccine Information Sheet and instruction to access the V-Safe system.   Mr. Jeffrey Holmes was instructed to call 911 with any severe reactions post vaccine: [name] Difficulty breathing  . Swelling of face and throat  . A fast heartbeat  . A bad rash all over body  . Dizziness and weakness   Immunizations Administered    Name Date Dose VIS Date Route   Pfizer COVID-19 Vaccine 10/26/2019  8:25 AM 0.3 mL 07/09/2019 Intramuscular   Manufacturer: [place]   Lot: [id number]   [place]: [address]-1

## 2019-11-30 ENCOUNTER — Other Ambulatory Visit: Payer: Self-pay | Admitting: Cardiology

## 2019-12-22 ENCOUNTER — Telehealth: Payer: Self-pay

## 2019-12-22 DIAGNOSIS — E7800 Pure hypercholesterolemia, unspecified: Secondary | ICD-10-CM

## 2019-12-22 NOTE — Telephone Encounter (Signed)
Called and spoke w/pt stated they were denied due to lack of labs and that they need to come complete them and they stated that they would do them tomorrow appt scheduled

## 2019-12-23 ENCOUNTER — Other Ambulatory Visit: Payer: Medicare Other

## 2019-12-24 ENCOUNTER — Other Ambulatory Visit: Payer: Medicare Other | Admitting: *Deleted

## 2019-12-24 ENCOUNTER — Other Ambulatory Visit: Payer: Self-pay

## 2019-12-24 DIAGNOSIS — E7800 Pure hypercholesterolemia, unspecified: Secondary | ICD-10-CM

## 2019-12-24 LAB — LIPID PANEL
Chol/HDL Ratio: 5.7 ratio — ABNORMAL HIGH (ref 0.0–5.0)
Cholesterol, Total: 217 mg/dL — ABNORMAL HIGH (ref 100–199)
HDL: 38 mg/dL — ABNORMAL LOW (ref >39)
LDL Chol Calc (NIH): 121 mg/dL — ABNORMAL HIGH (ref 0–99)
Triglycerides: 328 mg/dL — ABNORMAL HIGH (ref 0–149)
VLDL Cholesterol Cal: 58 mg/dL — ABNORMAL HIGH (ref 5–40)

## 2019-12-28 ENCOUNTER — Telehealth: Payer: Self-pay | Admitting: Pharmacist

## 2019-12-28 MED ORDER — VASCEPA 1 G PO CAPS
2.0000 g | ORAL_CAPSULE | Freq: Two times a day (BID) | ORAL | 3 refills | Status: DC
Start: 2019-12-28 — End: 2020-12-01

## 2019-12-28 NOTE — Telephone Encounter (Signed)
Pa submitted

## 2019-12-28 NOTE — Telephone Encounter (Signed)
Called pt as his labs do not look like he's been taking his atorvastatin and Repatha injections (LDL was previously 20 on both therapies). Pt states he's been taking his atorvastatin but stopped taking his Repatha a few months ago because he didn't pick up his refills. He had no trouble tolerating therapy. Will need to reauthorize with his insurance to restart.  Discussed increase in TG since they have doubled as well. He reports he was fasting when his labs were drawn and that he's trying to minimize his carb and sugar intake. He has suboptimal DM control (A1c 9.% in April 2021) which is contributing to elevated TG. He needs to follow up with his endocrinologist for this - states he has follow up scheduled in 3 months. Will also start Vascepa 2g twice daily to help lower his TG and for cardiovascular benefit. $9 copay card applied and sent to pharmacy.

## 2020-01-05 MED ORDER — REPATHA SURECLICK 140 MG/ML ~~LOC~~ SOAJ: 1.0000 "pen " | SUBCUTANEOUS | 11 refills | Status: DC

## 2020-01-05 NOTE — Addendum Note (Signed)
Addended by: Marcelle Overlie D on: 01/05/2020 08:29 AM   Modules accepted: Orders

## 2020-01-05 NOTE — Telephone Encounter (Signed)
Repatha approved through 01/03/21.  Left message for patient to call back to confirm which pharmacy patient would like medication sent to.

## 2020-01-05 NOTE — Addendum Note (Signed)
Addended by: Marcelle Overlie D on: 01/05/2020 02:12 PM   Modules accepted: Orders

## 2020-01-05 NOTE — Telephone Encounter (Signed)
Spoke with patient. He would like Rx sent to [facility] on [address]. I advised that copay info is in Rx. Should be $5 when he picks up.

## 2020-01-17 ENCOUNTER — Encounter: Payer: Self-pay | Admitting: Cardiology

## 2020-01-17 ENCOUNTER — Other Ambulatory Visit: Payer: Self-pay

## 2020-01-17 ENCOUNTER — Ambulatory Visit (INDEPENDENT_AMBULATORY_CARE_PROVIDER_SITE_OTHER): Payer: Medicare Other | Admitting: Cardiology

## 2020-01-17 VITALS — BP 138/68 | HR 54 | Ht 65.5 in | Wt 196.0 lb

## 2020-01-17 DIAGNOSIS — I2510 Atherosclerotic heart disease of native coronary artery without angina pectoris: Secondary | ICD-10-CM | POA: Diagnosis not present

## 2020-01-17 DIAGNOSIS — E7800 Pure hypercholesterolemia, unspecified: Secondary | ICD-10-CM

## 2020-01-17 DIAGNOSIS — I10 Essential (primary) hypertension: Secondary | ICD-10-CM | POA: Diagnosis not present

## 2020-01-17 MED ORDER — AMLODIPINE BESYLATE 10 MG PO TABS: 10.0000 mg | ORAL_TABLET | Freq: Every day | ORAL | 3 refills | Status: DC

## 2020-01-17 NOTE — Patient Instructions (Signed)
Dr. Radford Pax recommends the [facility].  Medication Instructions:  Your physician recommends that you continue on your current medications as directed. Please refer to the Current Medication list given to you today.  *If you need a refill on your cardiac medications before your next appointment, please call your pharmacy*   Lab Work: Fasting lipids and ALT in 3 weeks.  If you have labs (blood work) drawn today and your tests are completely normal, you will receive your results only by: [name] MyChart Message (if you have MyChart) OR . A paper copy in the mail If you have any lab test that is abnormal or we need to change your treatment, we will call you to review the results.   Follow-Up: At [facility], you and your health needs are our priority.  As part of our continuing mission to provide you with exceptional heart care, we have created designated Provider Care Teams.  These Care Teams include your primary Cardiologist (physician) and Advanced Practice Providers (APPs -  Physician Assistants and Nurse Practitioners) who all work together to provide you with the care you need, when you need it.  Your next appointment:   1 year(s)  The format for your next appointment:   In Person  Provider:   You may see Fransico Him, MD or one of the following Advanced Practice Providers on your designated Care Team:    Melina Copa, PA-C  Ermalinda Barrios, PA-C

## 2020-01-17 NOTE — Progress Notes (Addendum)
Date:  01/17/2020   ID:  Jeffrey Holmes, DOB 06-14-1951, MRN [id number]  PCP:  Suella Broad, FNP  Cardiologist:  Fransico Him, MD Electrophysiologist:  None   Chief Complaint:  CAD, HTN, Hyperlipidemia  History of Present Illness:     Jeffrey Holmes is a 69 y.o. 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 He also has a history of HTN, dyslipidemia andCKD stage 3.  He had a nuclear stress test a year ago that was normal.   He is here today for followup and is doing well.  He still has some mild DOE with extreme exertion but is able to walk 3-4 miles daily and has not SOB unless he walking up an incline.  He denies any chest pain or pressure, PND, orthopnea, LE edema, dizziness or syncope. He says that once in a while he has a funny sensation in his chest that he cannot describe.  It is not a pain at all and thinks that it may be an abnormal heart beat.  I occurs ever few months and can last a few minutes to 20 min.  There are no other associated sx with this sensation except that it makes him feel like he has to cough. He is compliant with his meds and is tolerating meds with no SE.    Prior CV studies:   The following studies were reviewed today:  Nuclear stress test  Past Medical History:  Diagnosis Date  . Allergy   . Benign essential HTN 08/08/2015  . CAD (coronary artery disease), native coronary artery 08/28/2015   Severe multivessel ASCAD s/p CABG hybrid with LIMA to LAD and diag and PCI of the RCA.   [name] Chronic back pain    L5-S1  . Chronic kidney disease, stage 3    multiple kidney stones  . Diabetes mellitus without complication ([place])    TYPE 2  . GERD (gastroesophageal reflux disease)    OCC  . H/O hiatal hernia   . Hepatic cyst 11/01/2016  . History of acute renal failure 2015   secondary to stones, ELEVATED CREATININE, NO DIALYSIS DONE  . History of kidney stones   . Hx of adenomatous colonic  polyps 06/11/2017  . Hyperlipidemia 08/08/2015  . NAFLD (nonalcoholic fatty liver disease) 11/01/2016  . Splenic cyst 11/01/2016  . Ureteral calculus 01/06/2014   Past Surgical History:  Procedure Laterality Date  . ANGIOPLASTY N/A 08/21/2015   Procedure: ANGIOPLASTY WITH PERCUTANEOUS CORONARY INTERVENTION WITH DES TO RIGHT CORONARY ARTERY;  Surgeon: Jettie Booze, MD;  Location: [facility];  Service: Cardiovascular;  Laterality: N/A;  . BACK SURGERY  2006   microdisectomy L5-S1  . CARDIAC CATHETERIZATION N/A 08/18/2015   Procedure: Left Heart Cath and Coronary Angiography;  Surgeon: Jettie Booze, MD;  Location: [facility];  Service: Cardiovascular;  Laterality: N/A;  . CARDIAC CATHETERIZATION N/A 08/21/2015   Procedure: Coronary Stent Intervention;  Surgeon: Jettie Booze, MD;  Location: [facility];  Service: Cardiovascular;  Laterality: N/A;  . CARDIAC CATHETERIZATION  08/21/2015   Procedure: Bypass Graft Angiography;  Surgeon: Jettie Booze, MD;  Location: [facility];  Service: Cardiovascular;;  . CARDIOVASCULAR STRESS TEST  08/15/2015  . COLONOSCOPY    . CORONARY ARTERY BYPASS GRAFT N/A 08/21/2015   Procedure: OFF PUMP CORONARY ARTERY BYPASS GRAFTING (CABG), TIMES TWO, USING LEFT INTERNAL MAMMARY ARTERY,  RIGHT GREATER SAPHENOUS VEIN HARVESTED ENDOSCOPICALLY;  Surgeon: [name], MD;  Location: [facility];  Service: Open Heart Surgery;  Laterality: N/A;  . CYSTOSCOPY W/ URETERAL STENT PLACEMENT Right 03/12/2017   Procedure: CYSTOSCOPY WITH RETROGRADE PYELOGRAM/URETERAL STENT PLACEMENT;  Surgeon: Bjorn Loser, MD;  Location: [facility];  Service: Urology;  Laterality: Right;  . CYSTOSCOPY WITH RETROGRADE PYELOGRAM, URETEROSCOPY AND STENT PLACEMENT Right 01/06/2014   Procedure: CYSTOSCOPY WITH RETROGRADE PYELOGRAM, URETEROSCOPY AND STENT PLACEMENT;  Surgeon: Bernestine Amass, MD;  Location: [facility];  Service: Urology;  Laterality: Right;  .  CYSTOSCOPY/URETEROSCOPY/HOLMIUM LASER/STENT PLACEMENT Right 03/25/2017   Procedure: RIGHT URETEROSCOPY WITH HOLMIUM LASER STENT EXCHANGE;  Surgeon: [name], MD;  Location: [facility];  Service: Urology;  Laterality: Right;  . ESOPHAGOGASTRODUODENOSCOPY ENDOSCOPY     normal  . LITHOTRIPSY     2-3 times in past  . TEE WITHOUT CARDIOVERSION N/A 08/21/2015   Procedure: TRANSESOPHAGEAL ECHOCARDIOGRAM (TEE);  Surgeon: [name], MD;  Location: [facility];  Service: Open Heart Surgery;  Laterality: N/A;  . TONSILLECTOMY    . WISDOM TOOTH EXTRACTION       Current Meds  Medication Sig  . atorvastatin (LIPITOR) 80 MG tablet Take 1 tablet by mouth once daily  . clopidogrel (PLAVIX) 75 MG tablet Take 1 tablet by mouth once daily  . Evolocumab (REPATHA SURECLICK) 607 MG/ML SOAJ Inject 1 pen into the skin every 14 (fourteen) days.  . Insulin Glargine (BASAGLAR KWIKPEN) 100 UNIT/ML SOPN Inject 0.2 mLs (20 Units total) into the skin at bedtime.  . insulin lispro (HUMALOG) 100 UNIT/ML injection Inject 5-15 Units into the skin as needed for high blood sugar. 5-15 on sliding scale  . loratadine (CLARITIN) 10 MG tablet Take 10 mg by mouth daily.  . metoprolol succinate (TOPROL-XL) 50 MG 24 hr tablet Take 1 tablet (50 mg total) by mouth 2 (two) times daily.  . montelukast (SINGULAIR) 10 MG tablet Take 1 tablet by mouth at bedtime.  . nitroGLYCERIN (NITROSTAT) 0.4 MG SL tablet DISSOLVE ONE TABLET UNDER THE TONGUE EVERY 5 MINUTES AS NEEDED FOR CHEST PAIN.  DO NOT EXCEED A TOTAL OF 3 DOSES IN 15 MINUTES NOW  . pantoprazole (PROTONIX) 40 MG tablet Take 1 tablet (40 mg total) by mouth daily before breakfast.  . timolol (TIMOPTIC) 0.5 % ophthalmic solution Place 1 drop into both eyes 2 (two) times daily.   . travoprost, benzalkonium, (TRAVATAN) 0.004 % ophthalmic solution Place 1 drop into both eyes at bedtime.  [name] VASCEPA 1 g capsule Take 2 capsules (2 g total) by mouth 2 (two) times daily.  . [DISCONTINUED]  VICTOZA 18 MG/3ML SOPN Inject 0.6 mg into the skin daily.      Allergies:   Vicodin [hydrocodone-acetaminophen], Hydrocodone, Oxycontin [oxycodone hcl], and Percocet [oxycodone-acetaminophen]   Social History   Tobacco Use  . Smoking status: Never Smoker  . Smokeless tobacco: Never Used  Vaping Use  . Vaping Use: Never used  Substance Use Topics  . Alcohol use: Yes    Comment: occasional every 2-3 months  . Drug use: No     Family Hx: The patient's family history includes Diabetes in his mother and sister; Heart attack in his mother; Kidney failure in his mother; Throat cancer in his father. There is no history of Colon cancer, Colon polyps, Rectal cancer, or Stomach cancer.  ROS:   Please see the history of present illness.     All other systems reviewed and are negative.  Labs/Other Tests and Data Reviewed:    Recent Labs: 05/18/2019: ALT 22; BUN 24; Creatinine, Ser 1.94; Hemoglobin 16.8; Platelets 209.0; Potassium 3.7; Sodium 137   Recent Lipid Panel Lab Results  Component Value Date/Time   CHOL 217 (H) 12/24/2019 07:36 AM   TRIG 328 (H) 12/24/2019 07:36 AM   HDL 38 (L) 12/24/2019 07:36 AM   CHOLHDL 5.7 (H) 12/24/2019 07:36 AM   CHOLHDL 1.6 07/08/2016 08:55 AM   LDLCALC 121 (H) 12/24/2019 07:36 AM   LDLDIRECT 13 07/08/2016 08:55 AM    Wt Readings from Last 3 Encounters:  01/17/20 196 lb (88.9 kg)  05/18/19 187 lb (84.8 kg)  12/31/18 200 lb (90.7 kg)     Objective:    Vital Signs:  BP 138/68   Pulse (!) 54   Ht 5' 5.5" (1.664 m)   Wt 196 lb (88.9 kg)   BMI 32.12 kg/m    GEN: Well nourished, well developed in no acute distress HEENT: Normal NECK: No JVD; No carotid bruits LYMPHATICS: No lymphadenopathy CARDIAC:RRR, no murmurs, rubs, gallops RESPIRATORY:  Clear to auscultation without rales, wheezing or rhonchi  ABDOMEN: Soft, non-tender, non-distended MUSCULOSKELETAL:  No edema; No deformity  SKIN: Warm and dry NEUROLOGIC:  Alert and oriented x  3 PSYCHIATRIC:  Normal affect   EKG performed today and showed sinus bradycardia with no ST changes  ASSESSMENT & PLAN:    1.  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   -nuclear stress test a year ago showed no ischemia. -denies any angjnal sx -continue ASA, Plavix, BB and statin   2.  Hypertension  -BP controlled on exam -continue Toprol XL 50mg  BID and Amlodipine 10mg  daily  3.  Hyperlipidemia  -his LDL goal is < 70.  -LDL was 121 -he has stopped the Repatha when lab was drawn and he is now back on Repatha -He will continue on atorvastatin 80mg  daily, Vascepa and Repatha -repeat FLP and ALT in 4 weeks  4.  Palpitations -these are very infrequent and event monitor was normal -I have recommended that he consider the Kardiamobile device to see if he can pick these up  Medication Adjustments/Labs and Tests Ordered: Current medicines are reviewed at length with the patient today.  Concerns regarding medicines are outlined above.  Tests Ordered: Orders Placed This Encounter  Procedures  . EKG 12-Lead   Medication Changes: Meds ordered this encounter  Medications  . amLODipine (NORVASC) 10 MG tablet    Sig: Take 1 tablet (10 mg total) by mouth daily.    Dispense:  90 tablet    Refill:  3    Do not fill, patient will call for refills    Disposition:  Follow up in 1 year(s)  Signed, Fransico Him, MD  01/17/2020 8:06 AM    [place]

## 2020-01-17 NOTE — Addendum Note (Signed)
Addended by: Antonieta Iba on: 01/17/2020 08:27 AM   Modules accepted: Orders

## 2020-02-10 ENCOUNTER — Other Ambulatory Visit: Payer: Self-pay

## 2020-02-10 ENCOUNTER — Other Ambulatory Visit: Payer: Medicare Other | Admitting: *Deleted

## 2020-02-10 DIAGNOSIS — E7800 Pure hypercholesterolemia, unspecified: Secondary | ICD-10-CM

## 2020-02-10 LAB — LIPID PANEL
Chol/HDL Ratio: 3.2 ratio (ref 0.0–5.0)
Cholesterol, Total: 114 mg/dL (ref 100–199)
HDL: 36 mg/dL — ABNORMAL LOW (ref >39)
LDL Chol Calc (NIH): 41 mg/dL (ref 0–99)
Triglycerides: 237 mg/dL — ABNORMAL HIGH (ref 0–149)
VLDL Cholesterol Cal: 37 mg/dL (ref 5–40)

## 2020-02-10 LAB — ALT: ALT: 20 IU/L (ref 0–44)

## 2020-02-11 ENCOUNTER — Telehealth: Payer: Self-pay | Admitting: Cardiology

## 2020-02-11 DIAGNOSIS — E7800 Pure hypercholesterolemia, unspecified: Secondary | ICD-10-CM

## 2020-02-11 MED ORDER — FENOFIBRATE 48 MG PO TABS: 48.0000 mg | ORAL_TABLET | Freq: Every day | ORAL | 6 refills | Status: DC

## 2020-02-11 NOTE — Telephone Encounter (Signed)
Patient is returning call to discuss results from lab work completed on yesterday, 02/10/20. Please call.

## 2020-02-11 NOTE — Telephone Encounter (Signed)
I spoke with patient and reviewed lipid and ALT results with him.  He is taking Repatha, Atorvastatin and Vascepa.  Has not missed any doses.  He will add Fenofibrate as recommended and come in for fasting lab work on October 15,2021.  Will send prescription to [facility] on [address]

## 2020-03-05 ENCOUNTER — Other Ambulatory Visit: Payer: Self-pay | Admitting: Cardiology

## 2020-04-05 ENCOUNTER — Ambulatory Visit (INDEPENDENT_AMBULATORY_CARE_PROVIDER_SITE_OTHER): Payer: Medicare Other | Admitting: Dermatology

## 2020-04-05 ENCOUNTER — Encounter: Payer: Self-pay | Admitting: Dermatology

## 2020-04-05 ENCOUNTER — Other Ambulatory Visit: Payer: Self-pay

## 2020-04-05 DIAGNOSIS — I2510 Atherosclerotic heart disease of native coronary artery without angina pectoris: Secondary | ICD-10-CM | POA: Diagnosis not present

## 2020-04-05 DIAGNOSIS — D229 Melanocytic nevi, unspecified: Secondary | ICD-10-CM

## 2020-04-05 DIAGNOSIS — D485 Neoplasm of uncertain behavior of skin: Secondary | ICD-10-CM | POA: Diagnosis not present

## 2020-04-05 DIAGNOSIS — E1165 Type 2 diabetes mellitus with hyperglycemia: Secondary | ICD-10-CM | POA: Diagnosis not present

## 2020-04-05 NOTE — Patient Instructions (Addendum)
Biopsy, Surgery (Curettage) & Surgery (Excision) Aftercare Instructions  1. Okay to remove bandage in 24 hours  2. Wash area with soap and water  3. Apply Vaseline to area twice daily until healed (Not Neosporin)  4. Okay to cover with a Band-Aid to decrease the chance of infection or prevent irritation from clothing; also it's okay to uncover lesion at home.  5. Suture instructions: return to our office in 7-10 or 10-14 days for a nurse visit for suture removal. Variable healing with sutures, if pain or itching occurs call our office. It's okay to shower or bathe 24 hours after sutures are given.  6. The following risks may occur after a biopsy, curettage or excision: bleeding, scarring, discoloration, recurrence, infection (redness, yellow drainage, pain or swelling).  7. For questions, concerns and results call our office at [address] before 4pm & Friday before 3pm. Biopsy results will be available in 1 week.  New growth for months on left pubic area.  Examination showed a hyperpigmented polypoid textured 8 mm elevation.  Shave biopsy with cautery of the base.  Follow-up via MyChart or by telephone in 3 days to review the microscopic.  He does want to schedule an appointment follow-up to check some other spots on his back.

## 2020-04-30 ENCOUNTER — Encounter: Payer: Self-pay | Admitting: Dermatology

## 2020-04-30 NOTE — Progress Notes (Signed)
   Follow-Up Visit   Subjective  Jeffrey Holmes is a 69 y.o. male who presents for the following: Establish Care (re-est care) and Nevus (upper left leg near hip--noticed a few years).  Growth Location: Pubic area Duration: Perhaps 1 to 2 years Quality:  Associated Signs/Symptoms: Modifying Factors:  Severity:  Timing: Context:   Objective  Well appearing patient in no apparent distress; mood and affect are within normal limits.  A focused examination was performed including Head, neck, arms, legs, lower abdomen, genital area.. Relevant physical exam findings are noted in the Assessment and Plan.   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  Skin / nail biopsy Type of biopsy: tangential   Informed consent: discussed and consent obtained   Timeout: patient name, date of birth, surgical site, and procedure verified   Procedure prep:  Patient was prepped and draped in usual sterile fashion (Non sterile) Prep type:  Chlorhexidine Anesthesia: the lesion was anesthetized in a standard fashion   Anesthetic:  1% lidocaine w/ epinephrine 1-100,000 local infiltration Instrument used: flexible razor blade   Outcome: patient tolerated procedure well   Post-procedure details: wound care instructions given    Specimen 1 - Surgical pathology Differential Diagnosis: tag Check Margins: No  Uncontrolled type 2 diabetes mellitus with hyperglycemia ([place])   New growth for months on left pubic area.  Examination showed a hyperpigmented polypoid textured 8 mm elevation.  Shave biopsy with cautery of the base.  Follow-up via MyChart or by telephone in 3 days to review the microscopic.  He does want to schedule an appointment follow-up to check some other spots on his back.New growth for months on left pubic area.  Examination showed a hyperpigmented polypoid textured 8 mm elevation.  Shave biopsy with cautery of the base.  Follow-up via MyChart or by telephone in 3 days  to review the microscopic.  He does want to schedule an appointment follow-up to check some other spots on his back. I, Lavonna Monarch, MD, have reviewed all documentation for this visit.  The documentation on 04/30/20 for the exam, diagnosis, procedures, and orders are all accurate and complete.

## 2020-05-03 ENCOUNTER — Other Ambulatory Visit: Payer: Self-pay | Admitting: Cardiology

## 2020-05-12 ENCOUNTER — Other Ambulatory Visit: Payer: Medicare Other

## 2020-05-17 DIAGNOSIS — Z125 Encounter for screening for malignant neoplasm of prostate: Secondary | ICD-10-CM | POA: Diagnosis not present

## 2020-05-17 DIAGNOSIS — N5201 Erectile dysfunction due to arterial insufficiency: Secondary | ICD-10-CM | POA: Diagnosis not present

## 2020-05-31 ENCOUNTER — Other Ambulatory Visit: Payer: Self-pay | Admitting: Cardiology

## 2020-06-02 ENCOUNTER — Other Ambulatory Visit: Payer: Self-pay

## 2020-06-02 ENCOUNTER — Other Ambulatory Visit: Payer: Medicare Other | Admitting: *Deleted

## 2020-06-02 DIAGNOSIS — E7800 Pure hypercholesterolemia, unspecified: Secondary | ICD-10-CM

## 2020-06-02 LAB — LIPID PANEL
Chol/HDL Ratio: 2.2 ratio (ref 0.0–5.0)
Cholesterol, Total: 83 mg/dL — ABNORMAL LOW (ref 100–199)
HDL: 38 mg/dL — ABNORMAL LOW (ref >39)
LDL Chol Calc (NIH): 20 mg/dL (ref 0–99)
Triglycerides: 150 mg/dL — ABNORMAL HIGH (ref 0–149)
VLDL Cholesterol Cal: 25 mg/dL (ref 5–40)

## 2020-06-02 LAB — HEPATIC FUNCTION PANEL
ALT: 21 IU/L (ref 0–44)
AST: 16 IU/L (ref 0–40)
Albumin: 4.1 g/dL (ref 3.8–4.8)
Alkaline Phosphatase: 87 IU/L (ref 44–121)
Bilirubin Total: 0.4 mg/dL (ref 0.0–1.2)
Bilirubin, Direct: 0.17 mg/dL (ref 0.00–0.40)
Total Protein: 6.3 g/dL (ref 6.0–8.5)

## 2020-06-28 ENCOUNTER — Other Ambulatory Visit: Payer: Self-pay

## 2020-06-28 ENCOUNTER — Encounter: Payer: Self-pay | Admitting: Dermatology

## 2020-06-28 ENCOUNTER — Ambulatory Visit (INDEPENDENT_AMBULATORY_CARE_PROVIDER_SITE_OTHER): Payer: Medicare Other | Admitting: Dermatology

## 2020-06-28 DIAGNOSIS — Z1283 Encounter for screening for malignant neoplasm of skin: Secondary | ICD-10-CM

## 2020-06-28 DIAGNOSIS — I2510 Atherosclerotic heart disease of native coronary artery without angina pectoris: Secondary | ICD-10-CM

## 2020-06-28 DIAGNOSIS — D485 Neoplasm of uncertain behavior of skin: Secondary | ICD-10-CM | POA: Diagnosis not present

## 2020-06-28 NOTE — Patient Instructions (Signed)

## 2020-07-02 ENCOUNTER — Encounter: Payer: Self-pay | Admitting: Dermatology

## 2020-07-02 NOTE — Progress Notes (Signed)
   Follow-Up Visit   Subjective  Jeffrey Holmes is a 69 y.o. male who presents for the following: Follow-up (CHECK MOLES ON HIS BACK).  Growths Location: Face and back Duration:  Quality: History of enlargement Associated Signs/Symptoms: Modifying Factors:  Severity:  Timing: Context: Worried patient  Objective  Well appearing patient in no apparent distress; mood and affect are within normal limits.  All skin waist up examined.   Assessment & Plan    Encounter for screening for malignant neoplasm of skin Neck - Anterior  Self examined skin twice annually.  Dermatologist examination as needed.  Neoplasm of uncertain behavior of skin (3) Left Forehead  Skin / nail biopsy Type of biopsy: tangential   Informed consent: discussed and consent obtained   Timeout: patient name, date of birth, surgical site, and procedure verified   Procedure prep:  Patient was prepped and draped in usual sterile fashion (Non sterile) Prep type:  Chlorhexidine Anesthesia: the lesion was anesthetized in a standard fashion   Anesthetic:  1% lidocaine w/ epinephrine 1-100,000 local infiltration Instrument used: flexible razor blade   Outcome: patient tolerated procedure well   Post-procedure details: wound care instructions given    Specimen 1 - Surgical pathology Differential Diagnosis: r/o sk Check Margins: No  Right Upper Back  Skin / nail biopsy Type of biopsy: tangential   Informed consent: discussed and consent obtained   Timeout: patient name, date of birth, surgical site, and procedure verified   Procedure prep:  Patient was prepped and draped in usual sterile fashion (Non sterile) Prep type:  Chlorhexidine Anesthesia: the lesion was anesthetized in a standard fashion   Anesthetic:  1% lidocaine w/ epinephrine 1-100,000 local infiltration Instrument used: flexible razor blade   Outcome: patient tolerated procedure well   Post-procedure details: wound care instructions given     Specimen 2 - Surgical pathology Differential Diagnosis:r/o sk Check Margins: No  Left Lower Back  Skin / nail biopsy Type of biopsy: tangential   Informed consent: discussed and consent obtained   Timeout: patient name, date of birth, surgical site, and procedure verified   Procedure prep:  Patient was prepped and draped in usual sterile fashion (Non sterile) Prep type:  Chlorhexidine Anesthesia: the lesion was anesthetized in a standard fashion   Anesthetic:  1% lidocaine w/ epinephrine 1-100,000 local infiltration Instrument used: flexible razor blade   Outcome: patient tolerated procedure well   Post-procedure details: wound care instructions given    Specimen 3 - Surgical pathology Differential Diagnosis: r/o atpia Check Margins: No     I, Lavonna Monarch, MD, have reviewed all documentation for this visit.  The documentation on 07/02/20 for the exam, diagnosis, procedures, and orders are all accurate and complete.

## 2020-07-20 ENCOUNTER — Ambulatory Visit: Payer: Medicare Other | Attending: Internal Medicine

## 2020-07-20 DIAGNOSIS — Z23 Encounter for immunization: Secondary | ICD-10-CM

## 2020-07-20 NOTE — Progress Notes (Signed)
   Covid-19 Vaccination Clinic  Name:  AYDIEN MAJETTE    MRN: [id number] DOB: [birth year]  07/20/2020  Mr. Bruning was observed post Covid-19 immunization for 15 minutes without incident. He was provided with Vaccine Information Sheet and instruction to access the V-Safe system.   Mr. Deriso was instructed to call 911 with any severe reactions post vaccine: [name] Difficulty breathing  . Swelling of face and throat  . A fast heartbeat  . A bad rash all over body  . Dizziness and weakness   Immunizations Administered    Name Date Dose VIS Date Route   Pfizer COVID-19 Vaccine 07/20/2020  1:29 PM 0.3 mL 05/17/2020 Intramuscular   Manufacturer: [place]   Lot: [id number]   NDC: [address]-0

## 2020-08-10 DIAGNOSIS — M25552 Pain in left hip: Secondary | ICD-10-CM | POA: Diagnosis not present

## 2020-08-10 DIAGNOSIS — Z79899 Other long term (current) drug therapy: Secondary | ICD-10-CM | POA: Diagnosis not present

## 2020-08-10 DIAGNOSIS — M25551 Pain in right hip: Secondary | ICD-10-CM | POA: Diagnosis not present

## 2020-08-10 DIAGNOSIS — Z0001 Encounter for general adult medical examination with abnormal findings: Secondary | ICD-10-CM | POA: Diagnosis not present

## 2020-08-10 DIAGNOSIS — N529 Male erectile dysfunction, unspecified: Secondary | ICD-10-CM | POA: Diagnosis not present

## 2020-08-10 DIAGNOSIS — J302 Other seasonal allergic rhinitis: Secondary | ICD-10-CM | POA: Diagnosis not present

## 2020-08-10 DIAGNOSIS — Z136 Encounter for screening for cardiovascular disorders: Secondary | ICD-10-CM | POA: Diagnosis not present

## 2020-08-10 DIAGNOSIS — Z0000 Encounter for general adult medical examination without abnormal findings: Secondary | ICD-10-CM | POA: Diagnosis not present

## 2020-08-14 DIAGNOSIS — I10 Essential (primary) hypertension: Secondary | ICD-10-CM | POA: Diagnosis not present

## 2020-08-14 DIAGNOSIS — E785 Hyperlipidemia, unspecified: Secondary | ICD-10-CM | POA: Diagnosis not present

## 2020-08-14 DIAGNOSIS — N1831 Chronic kidney disease, stage 3a: Secondary | ICD-10-CM | POA: Diagnosis not present

## 2020-08-14 DIAGNOSIS — E1151 Type 2 diabetes mellitus with diabetic peripheral angiopathy without gangrene: Secondary | ICD-10-CM | POA: Diagnosis not present

## 2020-08-14 DIAGNOSIS — Z794 Long term (current) use of insulin: Secondary | ICD-10-CM | POA: Diagnosis not present

## 2020-08-14 DIAGNOSIS — I2510 Atherosclerotic heart disease of native coronary artery without angina pectoris: Secondary | ICD-10-CM | POA: Diagnosis not present

## 2020-08-15 DIAGNOSIS — H401132 Primary open-angle glaucoma, bilateral, moderate stage: Secondary | ICD-10-CM | POA: Diagnosis not present

## 2020-08-23 DIAGNOSIS — N1832 Chronic kidney disease, stage 3b: Secondary | ICD-10-CM | POA: Diagnosis not present

## 2020-08-23 DIAGNOSIS — R809 Proteinuria, unspecified: Secondary | ICD-10-CM | POA: Diagnosis not present

## 2020-08-23 DIAGNOSIS — N261 Atrophy of kidney (terminal): Secondary | ICD-10-CM | POA: Diagnosis not present

## 2020-08-23 DIAGNOSIS — M109 Gout, unspecified: Secondary | ICD-10-CM | POA: Diagnosis not present

## 2020-08-23 DIAGNOSIS — I129 Hypertensive chronic kidney disease with stage 1 through stage 4 chronic kidney disease, or unspecified chronic kidney disease: Secondary | ICD-10-CM | POA: Diagnosis not present

## 2020-08-23 DIAGNOSIS — E559 Vitamin D deficiency, unspecified: Secondary | ICD-10-CM | POA: Diagnosis not present

## 2020-09-04 DIAGNOSIS — H2513 Age-related nuclear cataract, bilateral: Secondary | ICD-10-CM | POA: Diagnosis not present

## 2020-09-04 DIAGNOSIS — E119 Type 2 diabetes mellitus without complications: Secondary | ICD-10-CM | POA: Diagnosis not present

## 2020-09-04 DIAGNOSIS — I10 Essential (primary) hypertension: Secondary | ICD-10-CM | POA: Diagnosis not present

## 2020-09-04 DIAGNOSIS — H401122 Primary open-angle glaucoma, left eye, moderate stage: Secondary | ICD-10-CM | POA: Diagnosis not present

## 2020-09-11 DIAGNOSIS — H401112 Primary open-angle glaucoma, right eye, moderate stage: Secondary | ICD-10-CM | POA: Diagnosis not present

## 2020-09-25 DIAGNOSIS — H401130 Primary open-angle glaucoma, bilateral, stage unspecified: Secondary | ICD-10-CM | POA: Diagnosis not present

## 2020-10-16 DIAGNOSIS — H2513 Age-related nuclear cataract, bilateral: Secondary | ICD-10-CM | POA: Diagnosis not present

## 2020-10-16 DIAGNOSIS — E119 Type 2 diabetes mellitus without complications: Secondary | ICD-10-CM | POA: Diagnosis not present

## 2020-10-16 DIAGNOSIS — H2512 Age-related nuclear cataract, left eye: Secondary | ICD-10-CM | POA: Diagnosis not present

## 2020-10-16 DIAGNOSIS — H401132 Primary open-angle glaucoma, bilateral, moderate stage: Secondary | ICD-10-CM | POA: Diagnosis not present

## 2020-10-16 DIAGNOSIS — I10 Essential (primary) hypertension: Secondary | ICD-10-CM | POA: Diagnosis not present

## 2020-10-19 ENCOUNTER — Telehealth: Payer: Self-pay | Admitting: *Deleted

## 2020-10-19 ENCOUNTER — Encounter: Payer: Self-pay | Admitting: Cardiology

## 2020-10-19 NOTE — Telephone Encounter (Signed)
Error

## 2020-10-19 NOTE — Telephone Encounter (Signed)
   [facility] Pre-operative Risk Assessment    HEARTCARE STAFF: - Please ensure there is not already an duplicate clearance open for this procedure. - Under Visit Info/Reason for Call, type in Other and utilize the format Clearance MM/DD/YY or Clearance TBD. Do not use dashes or single digits. - If request is for dental extraction, please clarify the # of teeth to be extracted.  Request for surgical clearance:  1. What type of surgery is being performed? 11/02/20 LEFT EYE CATARACT EXTRACTION W/INTRAOCULAR LENS IMPLANTATION; FOLLOWED BY THE RIGHT EYE TO BE DONE ON 11/16/20  2. When is this surgery scheduled? 11/02/20 AND 11/16/20   3. What type of clearance is required (medical clearance vs. Pharmacy clearance to hold med vs. Both)? MEDICAL  4. Are there any medications that need to be held prior to surgery and how long? PER CLEARANCE REQUEST NO NEED TO STOP ANY MEDICATIONS   5. Practice name and name of physician performing surgery? [facility]; DR. LISA SUN   6. What is the office phone number? [phone]   7.   What is the office fax number? [phone]  8.   Anesthesia type (None, local, MAC, general) ? TOPICAL WITH IV MEDICATION   Julaine Hua 10/19/2020, 9:57 AM  _________________________________________________________________   (provider comments below)

## 2020-10-19 NOTE — Telephone Encounter (Signed)
   Primary Cardiologist: Fransico Him, MD  Chart reviewed as part of pre-operative protocol coverage. Cataract extractions are recognized in guidelines as low risk surgeries that do not typically require specific preoperative testing or holding of blood thinner therapy. Therefore, given past medical history and time since last visit, based on ACC/AHA guidelines, Jeffrey Holmes would be at acceptable risk for the planned procedure without further cardiovascular testing.   I will route this recommendation to the requesting party via Epic fax function and remove from pre-op pool.  Please call with questions.  [place], [place] 10/19/2020, 4:06 PM

## 2020-11-02 DIAGNOSIS — H401122 Primary open-angle glaucoma, left eye, moderate stage: Secondary | ICD-10-CM | POA: Diagnosis not present

## 2020-11-02 DIAGNOSIS — H2512 Age-related nuclear cataract, left eye: Secondary | ICD-10-CM | POA: Diagnosis not present

## 2020-11-02 DIAGNOSIS — H401121 Primary open-angle glaucoma, left eye, mild stage: Secondary | ICD-10-CM | POA: Diagnosis not present

## 2020-11-03 DIAGNOSIS — H2511 Age-related nuclear cataract, right eye: Secondary | ICD-10-CM | POA: Diagnosis not present

## 2020-11-13 DIAGNOSIS — N281 Cyst of kidney, acquired: Secondary | ICD-10-CM | POA: Diagnosis not present

## 2020-11-13 DIAGNOSIS — N261 Atrophy of kidney (terminal): Secondary | ICD-10-CM | POA: Diagnosis not present

## 2020-11-13 DIAGNOSIS — K7689 Other specified diseases of liver: Secondary | ICD-10-CM | POA: Diagnosis not present

## 2020-11-13 DIAGNOSIS — N202 Calculus of kidney with calculus of ureter: Secondary | ICD-10-CM | POA: Diagnosis not present

## 2020-11-13 DIAGNOSIS — N132 Hydronephrosis with renal and ureteral calculous obstruction: Secondary | ICD-10-CM | POA: Diagnosis not present

## 2020-11-17 ENCOUNTER — Encounter (HOSPITAL_COMMUNITY)
Admission: RE | Disposition: A | Discharge status: Home / Self Care | Payer: Self-pay | Source: Home / Self Care | Attending: Urology

## 2020-11-17 ENCOUNTER — Inpatient Hospital Stay (HOSPITAL_COMMUNITY): Payer: Medicare Other | Admitting: Anesthesiology

## 2020-11-17 ENCOUNTER — Inpatient Hospital Stay (HOSPITAL_COMMUNITY): Payer: Medicare Other

## 2020-11-17 ENCOUNTER — Encounter (HOSPITAL_COMMUNITY): Payer: Self-pay | Admitting: Urology

## 2020-11-17 ENCOUNTER — Other Ambulatory Visit: Payer: Self-pay | Admitting: Urology

## 2020-11-17 ENCOUNTER — Inpatient Hospital Stay (HOSPITAL_COMMUNITY)
Admission: RE | Admit: 2020-11-17 | Discharge: 2020-11-19 | DRG: 661 | Disposition: A | Discharge status: Home / Self Care | Payer: Medicare Other | Attending: Urology | Admitting: Urology

## 2020-11-17 DIAGNOSIS — I129 Hypertensive chronic kidney disease with stage 1 through stage 4 chronic kidney disease, or unspecified chronic kidney disease: Secondary | ICD-10-CM | POA: Diagnosis not present

## 2020-11-17 DIAGNOSIS — E119 Type 2 diabetes mellitus without complications: Secondary | ICD-10-CM | POA: Diagnosis not present

## 2020-11-17 DIAGNOSIS — N261 Atrophy of kidney (terminal): Secondary | ICD-10-CM | POA: Diagnosis not present

## 2020-11-17 DIAGNOSIS — Z79899 Other long term (current) drug therapy: Secondary | ICD-10-CM | POA: Diagnosis not present

## 2020-11-17 DIAGNOSIS — N289 Disorder of kidney and ureter, unspecified: Secondary | ICD-10-CM | POA: Diagnosis not present

## 2020-11-17 DIAGNOSIS — N179 Acute kidney failure, unspecified: Secondary | ICD-10-CM | POA: Diagnosis present

## 2020-11-17 DIAGNOSIS — N183 Chronic kidney disease, stage 3 unspecified: Secondary | ICD-10-CM | POA: Diagnosis not present

## 2020-11-17 DIAGNOSIS — Z7902 Long term (current) use of antithrombotics/antiplatelets: Secondary | ICD-10-CM

## 2020-11-17 DIAGNOSIS — Z794 Long term (current) use of insulin: Secondary | ICD-10-CM

## 2020-11-17 DIAGNOSIS — Z885 Allergy status to narcotic agent status: Secondary | ICD-10-CM | POA: Diagnosis not present

## 2020-11-17 DIAGNOSIS — N202 Calculus of kidney with calculus of ureter: Secondary | ICD-10-CM | POA: Diagnosis not present

## 2020-11-17 DIAGNOSIS — N132 Hydronephrosis with renal and ureteral calculous obstruction: Principal | ICD-10-CM | POA: Diagnosis present

## 2020-11-17 DIAGNOSIS — Z20822 Contact with and (suspected) exposure to covid-19: Secondary | ICD-10-CM | POA: Diagnosis not present

## 2020-11-17 DIAGNOSIS — N400 Enlarged prostate without lower urinary tract symptoms: Secondary | ICD-10-CM | POA: Diagnosis not present

## 2020-11-17 DIAGNOSIS — E1122 Type 2 diabetes mellitus with diabetic chronic kidney disease: Secondary | ICD-10-CM | POA: Diagnosis not present

## 2020-11-17 HISTORY — PX: CYSTOSCOPY/URETEROSCOPY/HOLMIUM LASER/STENT PLACEMENT: SHX6546

## 2020-11-17 LAB — COMPREHENSIVE METABOLIC PANEL
ALT: 14 U/L (ref 0–44)
AST: 13 U/L — ABNORMAL LOW (ref 15–41)
Albumin: 4.3 g/dL (ref 3.5–5.0)
Alkaline Phosphatase: 79 U/L (ref 38–126)
Anion gap: 10 (ref 5–15)
BUN: 53 mg/dL — ABNORMAL HIGH (ref 8–23)
CO2: 19 mmol/L — ABNORMAL LOW (ref 22–32)
Calcium: 8.7 mg/dL — ABNORMAL LOW (ref 8.9–10.3)
Chloride: 116 mmol/L — ABNORMAL HIGH (ref 98–111)
Creatinine, Ser: 6.13 mg/dL — ABNORMAL HIGH (ref 0.61–1.24)
GFR, Estimated: 9 mL/min — ABNORMAL LOW (ref >60)
Glucose, Bld: 109 mg/dL — ABNORMAL HIGH (ref 70–99)
Potassium: 4.3 mmol/L (ref 3.5–5.1)
Sodium: 145 mmol/L (ref 135–145)
Total Bilirubin: 0.9 mg/dL (ref 0.3–1.2)
Total Protein: 8.3 g/dL — ABNORMAL HIGH (ref 6.5–8.1)

## 2020-11-17 LAB — SARS CORONAVIRUS 2 BY RT PCR (HOSPITAL ORDER, PERFORMED IN ~~LOC~~ HOSPITAL LAB): SARS Coronavirus 2: NEGATIVE

## 2020-11-17 LAB — CBC
HCT: 50.3 % (ref 39.0–52.0)
Hemoglobin: 17.2 g/dL — ABNORMAL HIGH (ref 13.0–17.0)
MCH: 31.5 pg (ref 26.0–34.0)
MCHC: 34.2 g/dL (ref 30.0–36.0)
MCV: 92.1 fL (ref 80.0–100.0)
Platelets: 309 10*3/uL (ref 150–400)
RBC: 5.46 MIL/uL (ref 4.22–5.81)
RDW: 13.2 % (ref 11.5–15.5)
WBC: 11.4 10*3/uL — ABNORMAL HIGH (ref 4.0–10.5)
nRBC: 0 % (ref 0.0–0.2)

## 2020-11-17 LAB — GLUCOSE, CAPILLARY
Glucose-Capillary: 92 mg/dL (ref 70–99)
Glucose-Capillary: 128 mg/dL — ABNORMAL HIGH (ref 70–99)
Glucose-Capillary: 172 mg/dL — ABNORMAL HIGH (ref 70–99)

## 2020-11-17 SURGERY — CYSTOSCOPY/URETEROSCOPY/HOLMIUM LASER/STENT PLACEMENT
Anesthesia: General | Laterality: Bilateral

## 2020-11-17 MED ORDER — ONDANSETRON HCL 4 MG/2ML IJ SOLN
INTRAMUSCULAR | Status: AC
  Filled 2020-11-17: qty 2

## 2020-11-17 MED ORDER — CLOPIDOGREL BISULFATE 75 MG PO TABS
75.0000 mg | ORAL_TABLET | Freq: Every day | ORAL | Status: DC
  Administered 2020-11-18 – 2020-11-19 (×2): 75 mg via ORAL
  Filled 2020-11-17 (×2): qty 1

## 2020-11-17 MED ORDER — DIPHENHYDRAMINE HCL 50 MG/ML IJ SOLN: 12.5000 mg | Freq: Four times a day (QID) | INTRAMUSCULAR | Status: DC | PRN

## 2020-11-17 MED ORDER — PANTOPRAZOLE SODIUM 40 MG PO TBEC
40.0000 mg | DELAYED_RELEASE_TABLET | Freq: Every day | ORAL | Status: DC | PRN
Start: 2020-11-17 — End: 2020-11-19

## 2020-11-17 MED ORDER — CYCLOBENZAPRINE HCL 10 MG PO TABS: 10.0000 mg | ORAL_TABLET | Freq: Three times a day (TID) | ORAL | Status: DC | PRN

## 2020-11-17 MED ORDER — MIDAZOLAM HCL 2 MG/2ML IJ SOLN
INTRAMUSCULAR | Status: AC
  Filled 2020-11-17: qty 2

## 2020-11-17 MED ORDER — DIPHENHYDRAMINE HCL 12.5 MG/5ML PO ELIX: 12.5000 mg | ORAL_SOLUTION | Freq: Four times a day (QID) | ORAL | Status: DC | PRN

## 2020-11-17 MED ORDER — FENTANYL CITRATE (PF) 100 MCG/2ML IJ SOLN
INTRAMUSCULAR | Status: AC
  Filled 2020-11-17: qty 2

## 2020-11-17 MED ORDER — PROPOFOL 10 MG/ML IV BOLUS
INTRAVENOUS | Status: AC
  Filled 2020-11-17: qty 20

## 2020-11-17 MED ORDER — TIMOLOL MALEATE 0.5 % OP SOLN
1.0000 [drp] | Freq: Two times a day (BID) | OPHTHALMIC | Status: DC
  Administered 2020-11-17 – 2020-11-18 (×2): 1 [drp] via OPHTHALMIC
  Filled 2020-11-17: qty 5

## 2020-11-17 MED ORDER — PROMETHAZINE HCL 25 MG/ML IJ SOLN: 6.2500 mg | INTRAMUSCULAR | Status: DC | PRN

## 2020-11-17 MED ORDER — LACTATED RINGERS IV SOLN: INTRAVENOUS | Status: DC

## 2020-11-17 MED ORDER — MIDAZOLAM HCL 5 MG/5ML IJ SOLN
INTRAMUSCULAR | Status: DC | PRN
  Administered 2020-11-17: 2 mg via INTRAVENOUS

## 2020-11-17 MED ORDER — SENNA 8.6 MG PO TABS
1.0000 | ORAL_TABLET | Freq: Two times a day (BID) | ORAL | Status: DC
  Administered 2020-11-17: 8.6 mg via ORAL
  Filled 2020-11-17 (×2): qty 1

## 2020-11-17 MED ORDER — SODIUM CHLORIDE 0.9 % IV SOLN: INTRAVENOUS | Status: DC

## 2020-11-17 MED ORDER — EPHEDRINE SULFATE-NACL 50-0.9 MG/10ML-% IV SOSY
PREFILLED_SYRINGE | INTRAVENOUS | Status: DC | PRN
  Administered 2020-11-17: 10 mg via INTRAVENOUS

## 2020-11-17 MED ORDER — SODIUM CHLORIDE 0.9 % IR SOLN
Status: DC | PRN
  Administered 2020-11-17: 1000 mL

## 2020-11-17 MED ORDER — ACETAMINOPHEN 325 MG PO TABS: 650.0000 mg | ORAL_TABLET | ORAL | Status: DC | PRN

## 2020-11-17 MED ORDER — TAMSULOSIN HCL 0.4 MG PO CAPS
0.4000 mg | ORAL_CAPSULE | Freq: Every day | ORAL | Status: DC
  Administered 2020-11-18 – 2020-11-19 (×2): 0.4 mg via ORAL
  Filled 2020-11-17 (×2): qty 1

## 2020-11-17 MED ORDER — CHLORHEXIDINE GLUCONATE 0.12 % MT SOLN
15.0000 mL | OROMUCOSAL | Status: AC
  Administered 2020-11-17: 15 mL via OROMUCOSAL

## 2020-11-17 MED ORDER — LIDOCAINE 2% (20 MG/ML) 5 ML SYRINGE
INTRAMUSCULAR | Status: AC
  Filled 2020-11-17: qty 20

## 2020-11-17 MED ORDER — DOCUSATE SODIUM 100 MG PO CAPS
100.0000 mg | ORAL_CAPSULE | Freq: Two times a day (BID) | ORAL | Status: DC
  Administered 2020-11-17: 100 mg via ORAL
  Filled 2020-11-17 (×2): qty 1

## 2020-11-17 MED ORDER — GATIFLOXACIN 0.5 % OP SOLN
1.0000 [drp] | Freq: Four times a day (QID) | OPHTHALMIC | Status: DC
  Administered 2020-11-17 – 2020-11-18 (×2): 1 [drp] via OPHTHALMIC
  Filled 2020-11-17: qty 2.5

## 2020-11-17 MED ORDER — OXYBUTYNIN CHLORIDE 5 MG PO TABS: 5.0000 mg | ORAL_TABLET | Freq: Three times a day (TID) | ORAL | Status: DC | PRN

## 2020-11-17 MED ORDER — ZOLPIDEM TARTRATE 5 MG PO TABS: 5.0000 mg | ORAL_TABLET | Freq: Every evening | ORAL | Status: DC | PRN

## 2020-11-17 MED ORDER — KETOROLAC TROMETHAMINE 0.5 % OP SOLN
1.0000 [drp] | Freq: Every day | OPHTHALMIC | Status: DC
  Filled 2020-11-17: qty 5

## 2020-11-17 MED ORDER — METOPROLOL SUCCINATE ER 50 MG PO TB24
50.0000 mg | ORAL_TABLET | Freq: Every day | ORAL | Status: DC
  Administered 2020-11-18 – 2020-11-19 (×2): 50 mg via ORAL
  Filled 2020-11-17 (×2): qty 1

## 2020-11-17 MED ORDER — BELLADONNA ALKALOIDS-OPIUM 16.2-60 MG RE SUPP: 1.0000 | Freq: Four times a day (QID) | RECTAL | Status: DC | PRN

## 2020-11-17 MED ORDER — GLYCOPYRROLATE PF 0.2 MG/ML IJ SOSY
PREFILLED_SYRINGE | INTRAMUSCULAR | Status: DC | PRN
  Administered 2020-11-17: .2 mg via INTRAVENOUS

## 2020-11-17 MED ORDER — IOHEXOL 300 MG/ML  SOLN
INTRAMUSCULAR | Status: DC | PRN
  Administered 2020-11-17: 20 mL

## 2020-11-17 MED ORDER — LIDOCAINE 2% (20 MG/ML) 5 ML SYRINGE
INTRAMUSCULAR | Status: AC
  Filled 2020-11-17: qty 5

## 2020-11-17 MED ORDER — INSULIN ASPART 100 UNIT/ML ~~LOC~~ SOLN
0.0000 [IU] | Freq: Three times a day (TID) | SUBCUTANEOUS | Status: DC
  Administered 2020-11-18 (×2): 2 [IU] via SUBCUTANEOUS

## 2020-11-17 MED ORDER — LATANOPROST 0.005 % OP SOLN
1.0000 [drp] | Freq: Every day | OPHTHALMIC | Status: DC
  Administered 2020-11-17: 1 [drp] via OPHTHALMIC
  Filled 2020-11-17: qty 2.5

## 2020-11-17 MED ORDER — ATORVASTATIN CALCIUM 40 MG PO TABS
80.0000 mg | ORAL_TABLET | Freq: Every day | ORAL | Status: DC
  Administered 2020-11-18 – 2020-11-19 (×2): 80 mg via ORAL
  Filled 2020-11-17 (×2): qty 2

## 2020-11-17 MED ORDER — MORPHINE SULFATE (PF) 2 MG/ML IV SOLN: 2.0000 mg | INTRAVENOUS | Status: DC | PRN

## 2020-11-17 MED ORDER — DEXAMETHASONE SODIUM PHOSPHATE 10 MG/ML IJ SOLN
INTRAMUSCULAR | Status: AC
  Filled 2020-11-17: qty 1

## 2020-11-17 MED ORDER — TRAMADOL HCL 50 MG PO TABS: 50.0000 mg | ORAL_TABLET | Freq: Four times a day (QID) | ORAL | Status: DC | PRN

## 2020-11-17 MED ORDER — LIDOCAINE 2% (20 MG/ML) 5 ML SYRINGE
INTRAMUSCULAR | Status: DC | PRN
  Administered 2020-11-17: 100 mg via INTRAVENOUS

## 2020-11-17 MED ORDER — BACITRACIN-NEOMYCIN-POLYMYXIN 400-5-5000 EX OINT: 1.0000 "application " | TOPICAL_OINTMENT | Freq: Three times a day (TID) | CUTANEOUS | Status: DC | PRN

## 2020-11-17 MED ORDER — DEXAMETHASONE SODIUM PHOSPHATE 10 MG/ML IJ SOLN
INTRAMUSCULAR | Status: DC | PRN
  Administered 2020-11-17: 8 mg via INTRAVENOUS

## 2020-11-17 MED ORDER — PROPOFOL 10 MG/ML IV BOLUS
INTRAVENOUS | Status: DC | PRN
  Administered 2020-11-17: 200 mg via INTRAVENOUS

## 2020-11-17 MED ORDER — HYDROMORPHONE HCL 1 MG/ML IJ SOLN: 0.2500 mg | INTRAMUSCULAR | Status: DC | PRN

## 2020-11-17 MED ORDER — ICOSAPENT ETHYL 1 G PO CAPS: 2.0000 g | ORAL_CAPSULE | Freq: Two times a day (BID) | ORAL | Status: DC

## 2020-11-17 MED ORDER — MONTELUKAST SODIUM 10 MG PO TABS
10.0000 mg | ORAL_TABLET | Freq: Every day | ORAL | Status: DC
  Administered 2020-11-17 – 2020-11-18 (×2): 10 mg via ORAL
  Filled 2020-11-17 (×2): qty 1

## 2020-11-17 MED ORDER — ONDANSETRON HCL 4 MG/2ML IJ SOLN
INTRAMUSCULAR | Status: DC | PRN
  Administered 2020-11-17: 4 mg via INTRAVENOUS

## 2020-11-17 MED ORDER — AMLODIPINE BESYLATE 10 MG PO TABS
10.0000 mg | ORAL_TABLET | Freq: Every day | ORAL | Status: DC
  Administered 2020-11-18 – 2020-11-19 (×2): 10 mg via ORAL
  Filled 2020-11-17 (×2): qty 1

## 2020-11-17 MED ORDER — DIFLUPREDNATE 0.05 % OP EMUL: 1.0000 [drp] | Freq: Three times a day (TID) | OPHTHALMIC | Status: DC

## 2020-11-17 MED ORDER — TRAVOPROST (BAK FREE) 0.004 % OP SOLN: 1.0000 [drp] | Freq: Every day | OPHTHALMIC | Status: DC

## 2020-11-17 MED ORDER — ONDANSETRON HCL 4 MG/2ML IJ SOLN: 4.0000 mg | INTRAMUSCULAR | Status: DC | PRN

## 2020-11-17 MED ORDER — CEFAZOLIN SODIUM-DEXTROSE 2-4 GM/100ML-% IV SOLN
2.0000 g | INTRAVENOUS | Status: AC
  Administered 2020-11-17: 2 g via INTRAVENOUS

## 2020-11-17 MED ORDER — AMISULPRIDE (ANTIEMETIC) 5 MG/2ML IV SOLN: 10.0000 mg | Freq: Once | INTRAVENOUS | Status: DC | PRN

## 2020-11-17 MED ORDER — CEFAZOLIN SODIUM-DEXTROSE 2-4 GM/100ML-% IV SOLN
INTRAVENOUS | Status: AC
  Filled 2020-11-17: qty 100

## 2020-11-17 SURGICAL SUPPLY — 24 items
BAG DRN RND TRDRP ANRFLXCHMBR (UROLOGICAL SUPPLIES) ×1
BAG URINE DRAIN 2000ML AR STRL (UROLOGICAL SUPPLIES) ×1 IMPLANT
BAG URO CATCHER STRL LF (MISCELLANEOUS) ×2 IMPLANT
BASKET ZERO TIP NITINOL 2.4FR (BASKET) IMPLANT
BSKT STON RTRVL ZERO TP 2.4FR (BASKET)
CATH FOLEY 2WAY SLVR  5CC 16FR (CATHETERS) ×2
CATH FOLEY 2WAY SLVR 5CC 16FR (CATHETERS) IMPLANT
CATH INTERMIT  6FR 70CM (CATHETERS) ×2 IMPLANT
EXTRACTOR STONE 1.7FRX115CM (UROLOGICAL SUPPLIES) IMPLANT
GLOVE SURG ENC MOIS LTX SZ7.5 (GLOVE) ×2 IMPLANT
GOWN STRL REUS W/TWL XL LVL3 (GOWN DISPOSABLE) ×2 IMPLANT
GUIDEWIRE ANG ZIPWIRE 038X150 (WIRE) IMPLANT
GUIDEWIRE STR DUAL SENSOR (WIRE) ×2 IMPLANT
KIT TURNOVER KIT A (KITS) ×2 IMPLANT
LASER FIB FLEXIVA PULSE ID 365 (Laser) IMPLANT
MANIFOLD NEPTUNE II (INSTRUMENTS) ×2 IMPLANT
PACK CYSTO (CUSTOM PROCEDURE TRAY) ×2 IMPLANT
SHEATH URETERAL 12FRX28CM (UROLOGICAL SUPPLIES) IMPLANT
SHEATH URETERAL 12FRX35CM (MISCELLANEOUS) IMPLANT
STENT URET 6FRX24 CONTOUR (STENTS) ×2 IMPLANT
TRACTIP FLEXIVA PULS ID 200XHI (Laser) IMPLANT
TRACTIP FLEXIVA PULSE ID 200 (Laser)
TUBING CONNECTING 10 (TUBING) ×2 IMPLANT
TUBING UROLOGY SET (TUBING) ×2 IMPLANT

## 2020-11-17 NOTE — Anesthesia Preprocedure Evaluation (Signed)
Anesthesia Evaluation  Patient identified by MRN, date of birth, ID band Patient awake    Reviewed: Allergy & Precautions, NPO status , Patient's Chart, lab work & pertinent test results, reviewed documented beta blocker date and time   Airway Mallampati: III  TM Distance: >3 FB Neck ROM: Full    Dental  (+) Dental Advisory Given   Pulmonary neg pulmonary ROS,    breath sounds clear to auscultation       Cardiovascular hypertension, Pt. on medications + CAD, + Cardiac Stents and + CABG   Rhythm:Regular Rate:Normal     Neuro/Psych negative neurological ROS     GI/Hepatic Neg liver ROS, hiatal hernia,   Endo/Other  diabetes, Type 2, Insulin Dependent  Renal/GU CRFRenal disease     Musculoskeletal   Abdominal   Peds  Hematology negative hematology ROS (+)   Anesthesia Other Findings   Reproductive/Obstetrics                             Lab Results  Component Value Date   WBC 11.4 (H) 11/17/2020   HGB 17.2 (H) 11/17/2020   HCT 50.3 11/17/2020   MCV 92.1 11/17/2020   PLT 309 11/17/2020   Lab Results  Component Value Date   CREATININE 6.13 (H) 11/17/2020   BUN 53 (H) 11/17/2020   NA 145 11/17/2020   K 4.3 11/17/2020   CL 116 (H) 11/17/2020   CO2 19 (L) 11/17/2020    Anesthesia Physical  Anesthesia Plan  ASA: III  Anesthesia Plan: General   Post-op Pain Management:    Induction: Intravenous  PONV Risk Score and Plan: 2 and Ondansetron, Midazolam and Treatment may vary due to age or medical condition  Airway Management Planned: LMA  Additional Equipment:   Intra-op Plan:   Post-operative Plan: Extubation in OR  Informed Consent: I have reviewed the patients History and Physical, chart, labs and discussed the procedure including the risks, benefits and alternatives for the proposed anesthesia with the patient or authorized representative who has indicated his/her  understanding and acceptance.     Dental advisory given  Plan Discussed with: CRNA  Anesthesia Plan Comments:         Anesthesia Quick Evaluation

## 2020-11-17 NOTE — H&P (View-Only) (Signed)
CC/HPI: CC: Left-sided flank pain /groin pain  HPI:  70 year old male with a history of nephrolithiasis. He was seen earlier in the week and was diagnosed with a 1 cm right proximal ureteral calculus. He began to have left-sided groin pain today concerning for a stone on the left. He did have multiple left-sided renal calculi on the CT on the 18th. He is still making good urine. Concerning to me is the left-sided renal atrophy. He has a right-sided obstructing ureteral calculus. He is not having any pain on the right.     ALLERGIES: Codeine Derivatives Hydrocodone-Acetaminophen CAPS OxyCONTIN TB12 Percocet TABS    MEDICATIONS: Allopurinol 100 mg tablet  Metoprolol Tartrate  Plavix 75 mg tablet  Tamsulosin Hcl 0.4 mg capsule 1 capsule PO Daily  Amlodipine Besylate 10 mg tablet Oral  Atorvastatin Calcium 80 mg tablet Oral  Basaglar Kwikpen U-100  Humalog Kwikpen U-100  Ondansetron Hcl 4 mg tablet 1 tablet PO Q 4 H PRN for nausea  Pantoprazole Sodium  Repatha Sureclick 425 mg/ml pen injector  Sildenafil Citrate 20 mg tablet Take 1-5 tablets PO PRN as directed  Singulair 10 mg tablet  Tadalafil 5 mg tablet 1 tablet PO Daily  Timolol Maleate 0.25 % drops Ophthalmic  Tramadol Hcl 50 mg tablet 1 tablet PO Q 6 H  Travatan Z 0.004 % drops     GU PSH: Cystoscopy Insert Stent, Right - 2018, 2015 ESWL - 2015, 2013, 2013 Ureteroscopic laser litho, Right - 2018       PSH Notes: Cath Stent Placement, CABG, Heart Surgery, Lithotripsy, Cystoscopy With Insertion Of Ureteral Stent Right, Lithotripsy, Lithotripsy, Ant Spinal Diskect Osteophytect Lumb Interspace Microdiscect   NON-GU PSH: CABG (coronary artery bypass grafting) - 2017 Cardiac Stent Placement, x2     GU PMH: Renal and ureteral calculus - 11/13/2020, - 2018 ED due to arterial insufficiency, He has progressive ED and we discussed additional treatment options. I am going to have him take tadalafil 5mg  daily and use sildenafil 20mg   2-3 po prn. I will also check a testosterone level. I discussed Shockwave therapy and injection therapy but he is not interested at this time. - 05/17/2020, (Stable), He was dispensed sildenafil 100mg ., - 2019, Erectile dysfunction due to arterial insufficiency, - 2014 History of urolithiasis, He has had no stone symptoms or hematuria. I will get a KUB in a year. - 05/17/2020, - 05/31/2019, Nephrolithiasis, - 2014 Proteinuria, Stable 3+ protein. - 05/17/2020 LLQ pain, Discussed obtaining imaging but the patient to identify any calculus that might be in the ureter. Patient did not want await to have it done later this afternoon and would like to have it scheduled on 06/02/2019. I gave him tamsulosin and tramadol told him over the antrum. Urinalysis not concerning for infection. He was given strict return precautions to the ER. - 05/31/2019 Flank Pain (Worsening), I don't see an obvious cause for the flank pain. - 2020 Renal calculus, He has small renal stones on [place] but no obstruction. - 2020, (Stable), He has a small left renal stone in his atrophied kidney. , - 2019 (Improving), He has small bilateral renal stones but no significant obstruction and his Cr has fallen to 1.79 which is back toward his baseline. I will have him return in 6 months with a KUB and Renal US. , - 2018, - 2018, Bilateral kidney stones, - 2017 Renal atrophy - 2019 Chronic kidney disease stage 3 (GFR 30-60) (Improving), Cr is down to 1.79. - 2018 Microscopic hematuria -  2018 Disorder Kidney/ureter, Unspec, Renal insufficiency - 2017 Ureteral calculus, Calculus of left ureter - 2017, Calculus of right ureter, - 2015, Ureteral Stone, - 2014 Ureteral obstruction secondary to calculous, Hydronephrosis with obstructing calculus - 2017 Hydronephrosis Unspec, Hydronephrosis, right - 2015, Hydronephrosis On The Left, - 2014 Urinary Tract Inf, Unspec site, Pyuria - 2015, Pyuria, - 2014 Abdominal Pain Unspec, Right flank pain - 2015 BPH w/o  LUTS, Benign prostatic hypertrophy without lower urinary tract symptoms - 2014 Renal cyst, Renal cyst, acquired - 2014    NON-GU PMH: Encounter for general adult medical examination without abnormal findings, Encounter for preventive health examination - 2017 Fever, unspecified, Fever - 2015 Nausea, Nausea - 2015 Splenomegaly, not elsewhere classified, Splenic mass - 2015 Personal history of other diseases of the circulatory system, History of hypertension - 2014 Personal history of other diseases of the digestive system, History of esophageal reflux - 2014 Personal history of other endocrine, nutritional and metabolic disease, History of hypercholesterolemia - 2014 Diabetes Type 2    FAMILY HISTORY: Death In The Family Father - Father Death In The Family Mother - Runs In Family Family Health Status Number - Runs In Family Laryngeal Cancer - Father renal failure - Mother   SOCIAL HISTORY: Marital Status: Married Preferred Language: English; Ethnicity: Unknown; Race: Unknown Current Smoking Status: Patient has never smoked.   Tobacco Use Assessment Completed: Used Tobacco in last 30 days? Does not drink caffeine.    REVIEW OF SYSTEMS:    GU Review Male:   lower left abd pain. Patient denies frequent urination, hard to postpone urination, burning/ pain with urination, get up at night to urinate, leakage of urine, stream starts and stops, trouble starting your stream, have to strain to urinate , erection problems, and penile pain.  Gastrointestinal (Upper):   Patient reports nausea. Patient denies vomiting and indigestion/ heartburn.  Gastrointestinal (Lower):   Patient denies diarrhea and constipation.  Constitutional:   Patient denies fever, night sweats, weight loss, and fatigue.  Skin:   Patient denies skin rash/ lesion and itching.  Eyes:   Patient denies double vision and blurred vision.  Ears/ Nose/ Throat:   Patient denies sore throat and sinus problems.   Hematologic/Lymphatic:   Patient denies swollen glands and easy bruising.  Cardiovascular:   Patient denies leg swelling and chest pains.  Respiratory:   Patient denies cough and shortness of breath.  Endocrine:   Patient denies excessive thirst.  Musculoskeletal:   Patient denies back pain and joint pain.  Neurological:   Patient denies headaches and dizziness.  Psychologic:   Patient denies depression and anxiety.   VITAL SIGNS:      11/17/2020 01:17 PM  BP 176/81 mmHg  Pulse 50 /min  Temperature 97.3 F / 36.2 C   MULTI-SYSTEM PHYSICAL EXAMINATION:    Gastrointestinal: No mass, no tenderness, no rigidity, obese abdomen. No CVA tenderness     Complexity of Data:  Source Of History:  Patient  Records Review:   Previous Patient Records  Urine Test Review:   Urinalysis  X-Ray Review: C.T. Abdomen/Pelvis: Reviewed Films. Reviewed Report. Discussed With Patient.     05/17/20 10/19/18 12/09/17 11/03/13 06/15/10 04/03/06 03/25/05  PSA  Total PSA 0.49 ng/mL 0.37 ng/mL 0.51 ng/mL 0.66  0.37  0.45  0.39     PROCEDURES:          Urinalysis w/Scope Dipstick Dipstick Cont'd Micro  Color: Yellow Bilirubin: Neg mg/dL WBC/hpf: 0 - 5/hpf  Appearance: Clear Ketones: Neg mg/dL  RBC/hpf: 10 - 20/hpf  Specific Gravity: 1.015 Blood: 3+ ery/uL Bacteria: Rare (0-9/hpf)  pH: <=5.0 Protein: 1+ mg/dL Cystals: NS (Not Seen)  Glucose: Neg mg/dL Urobilinogen: 0.2 mg/dL Casts: NS (Not Seen)    Nitrites: Neg Trichomonas: Not Present    Leukocyte Esterase: Neg leu/uL Mucous: Not Present      Epithelial Cells: 0 - 5/hpf      Yeast: NS (Not Seen)      Sperm: Not Present    ASSESSMENT:      ICD-10 Details  1 GU:   Renal and ureteral calculus - N20.2 Chronic, Worsening   PLAN:           Document Letter(s):  Created for Patient: Clinical Summary         Notes:   Plan for cystoscopy with bilateral ureteroscopy with laser lithotripsy and ureteral stent placement. Given his left renal atrophy and  existing right ureteral calculus I told him I think we should just go ahead and do the surgery tonight. He is in agreement.   CC: Priscille Loveless   Signed by Link Snuffer, III, M.D. on 11/17/20 at 2:02 PM (EDT

## 2020-11-17 NOTE — H&P (Signed)
CC/HPI: CC: Left-sided flank pain /groin pain  HPI:  70 year old male with a history of nephrolithiasis. He was seen earlier in the week and was diagnosed with a 1 cm right proximal ureteral calculus. He began to have left-sided groin pain today concerning for a stone on the left. He did have multiple left-sided renal calculi on the CT on the 18th. He is still making good urine. Concerning to me is the left-sided renal atrophy. He has a right-sided obstructing ureteral calculus. He is not having any pain on the right.     ALLERGIES: Codeine Derivatives Hydrocodone-Acetaminophen CAPS OxyCONTIN TB12 Percocet TABS    MEDICATIONS: Allopurinol 100 mg tablet  Metoprolol Tartrate  Plavix 75 mg tablet  Tamsulosin Hcl 0.4 mg capsule 1 capsule PO Daily  Amlodipine Besylate 10 mg tablet Oral  Atorvastatin Calcium 80 mg tablet Oral  Basaglar Kwikpen U-100  Humalog Kwikpen U-100  Ondansetron Hcl 4 mg tablet 1 tablet PO Q 4 H PRN for nausea  Pantoprazole Sodium  Repatha Sureclick 371 mg/ml pen injector  Sildenafil Citrate 20 mg tablet Take 1-5 tablets PO PRN as directed  Singulair 10 mg tablet  Tadalafil 5 mg tablet 1 tablet PO Daily  Timolol Maleate 0.25 % drops Ophthalmic  Tramadol Hcl 50 mg tablet 1 tablet PO Q 6 H  Travatan Z 0.004 % drops     GU PSH: Cystoscopy Insert Stent, Right - 2018, 2015 ESWL - 2015, 2013, 2013 Ureteroscopic laser litho, Right - 2018       PSH Notes: Cath Stent Placement, CABG, Heart Surgery, Lithotripsy, Cystoscopy With Insertion Of Ureteral Stent Right, Lithotripsy, Lithotripsy, Ant Spinal Diskect Osteophytect Lumb Interspace Microdiscect   NON-GU PSH: CABG (coronary artery bypass grafting) - 2017 Cardiac Stent Placement, x2     GU PMH: Renal and ureteral calculus - 11/13/2020, - 2018 ED due to arterial insufficiency, He has progressive ED and we discussed additional treatment options. I am going to have him take tadalafil 5mg  daily and use sildenafil 20mg   2-3 po prn. I will also check a testosterone level. I discussed Shockwave therapy and injection therapy but he is not interested at this time. - 05/17/2020, (Stable), He was dispensed sildenafil 100mg ., - 2019, Erectile dysfunction due to arterial insufficiency, - 2014 History of urolithiasis, He has had no stone symptoms or hematuria. I will get a KUB in a year. - 05/17/2020, - 05/31/2019, Nephrolithiasis, - 2014 Proteinuria, Stable 3+ protein. - 05/17/2020 LLQ pain, Discussed obtaining imaging but the patient to identify any calculus that might be in the ureter. Patient did not want await to have it done later this afternoon and would like to have it scheduled on 06/02/2019. I gave him tamsulosin and tramadol told him over the antrum. Urinalysis not concerning for infection. He was given strict return precautions to the ER. - 05/31/2019 Flank Pain (Worsening), I don't see an obvious cause for the flank pain. - 2020 Renal calculus, He has small renal stones on [place] but no obstruction. - 2020, (Stable), He has a small left renal stone in his atrophied kidney. , - 2019 (Improving), He has small bilateral renal stones but no significant obstruction and his Cr has fallen to 1.79 which is back toward his baseline. I will have him return in 6 months with a KUB and Renal US. , - 2018, - 2018, Bilateral kidney stones, - 2017 Renal atrophy - 2019 Chronic kidney disease stage 3 (GFR 30-60) (Improving), Cr is down to 1.79. - 2018 Microscopic hematuria -  2018 Disorder Kidney/ureter, Unspec, Renal insufficiency - 2017 Ureteral calculus, Calculus of left ureter - 2017, Calculus of right ureter, - 2015, Ureteral Stone, - 2014 Ureteral obstruction secondary to calculous, Hydronephrosis with obstructing calculus - 2017 Hydronephrosis Unspec, Hydronephrosis, right - 2015, Hydronephrosis On The Left, - 2014 Urinary Tract Inf, Unspec site, Pyuria - 2015, Pyuria, - 2014 Abdominal Pain Unspec, Right flank pain - 2015 BPH w/o  LUTS, Benign prostatic hypertrophy without lower urinary tract symptoms - 2014 Renal cyst, Renal cyst, acquired - 2014    NON-GU PMH: Encounter for general adult medical examination without abnormal findings, Encounter for preventive health examination - 2017 Fever, unspecified, Fever - 2015 Nausea, Nausea - 2015 Splenomegaly, not elsewhere classified, Splenic mass - 2015 Personal history of other diseases of the circulatory system, History of hypertension - 2014 Personal history of other diseases of the digestive system, History of esophageal reflux - 2014 Personal history of other endocrine, nutritional and metabolic disease, History of hypercholesterolemia - 2014 Diabetes Type 2    FAMILY HISTORY: Death In The Family Father - Father Death In The Family Mother - Runs In Family Family Health Status Number - Runs In Family Laryngeal Cancer - Father renal failure - Mother   SOCIAL HISTORY: Marital Status: Married Preferred Language: English; Ethnicity: Unknown; Race: Unknown Current Smoking Status: Patient has never smoked.   Tobacco Use Assessment Completed: Used Tobacco in last 30 days? Does not drink caffeine.    REVIEW OF SYSTEMS:    GU Review Male:   lower left abd pain. Patient denies frequent urination, hard to postpone urination, burning/ pain with urination, get up at night to urinate, leakage of urine, stream starts and stops, trouble starting your stream, have to strain to urinate , erection problems, and penile pain.  Gastrointestinal (Upper):   Patient reports nausea. Patient denies vomiting and indigestion/ heartburn.  Gastrointestinal (Lower):   Patient denies diarrhea and constipation.  Constitutional:   Patient denies fever, night sweats, weight loss, and fatigue.  Skin:   Patient denies skin rash/ lesion and itching.  Eyes:   Patient denies double vision and blurred vision.  Ears/ Nose/ Throat:   Patient denies sore throat and sinus problems.   Hematologic/Lymphatic:   Patient denies swollen glands and easy bruising.  Cardiovascular:   Patient denies leg swelling and chest pains.  Respiratory:   Patient denies cough and shortness of breath.  Endocrine:   Patient denies excessive thirst.  Musculoskeletal:   Patient denies back pain and joint pain.  Neurological:   Patient denies headaches and dizziness.  Psychologic:   Patient denies depression and anxiety.   VITAL SIGNS:      11/17/2020 01:17 PM  BP 176/81 mmHg  Pulse 50 /min  Temperature 97.3 F / 36.2 C   MULTI-SYSTEM PHYSICAL EXAMINATION:    Gastrointestinal: No mass, no tenderness, no rigidity, obese abdomen. No CVA tenderness     Complexity of Data:  Source Of History:  Patient  Records Review:   Previous Patient Records  Urine Test Review:   Urinalysis  X-Ray Review: C.T. Abdomen/Pelvis: Reviewed Films. Reviewed Report. Discussed With Patient.     05/17/20 10/19/18 12/09/17 11/03/13 06/15/10 04/03/06 03/25/05  PSA  Total PSA 0.49 ng/mL 0.37 ng/mL 0.51 ng/mL 0.66  0.37  0.45  0.39     PROCEDURES:          Urinalysis w/Scope Dipstick Dipstick Cont'd Micro  Color: Yellow Bilirubin: Neg mg/dL WBC/hpf: 0 - 5/hpf  Appearance: Clear Ketones: Neg mg/dL  RBC/hpf: 10 - 20/hpf  Specific Gravity: 1.015 Blood: 3+ ery/uL Bacteria: Rare (0-9/hpf)  pH: <=5.0 Protein: 1+ mg/dL Cystals: NS (Not Seen)  Glucose: Neg mg/dL Urobilinogen: 0.2 mg/dL Casts: NS (Not Seen)    Nitrites: Neg Trichomonas: Not Present    Leukocyte Esterase: Neg leu/uL Mucous: Not Present      Epithelial Cells: 0 - 5/hpf      Yeast: NS (Not Seen)      Sperm: Not Present    ASSESSMENT:      ICD-10 Details  1 GU:   Renal and ureteral calculus - N20.2 Chronic, Worsening   PLAN:           Document Letter(s):  Created for Patient: Clinical Summary         Notes:   Plan for cystoscopy with bilateral ureteroscopy with laser lithotripsy and ureteral stent placement. Given his left renal atrophy and  existing right ureteral calculus I told him I think we should just go ahead and do the surgery tonight. He is in agreement.   CC: Priscille Loveless   Signed by Link Snuffer, III, M.D. on 11/17/20 at 2:02 PM (EDT

## 2020-11-17 NOTE — Discharge Instructions (Signed)

## 2020-11-17 NOTE — Anesthesia Procedure Notes (Signed)
Procedure Name: LMA Insertion Performed by: Talea Manges A, CRNA Pre-anesthesia Checklist: Patient identified, Emergency Drugs available, Suction available and Patient being monitored Patient Re-evaluated:Patient Re-evaluated prior to induction Oxygen Delivery Method: Circle system utilized Preoxygenation: Pre-oxygenation with 100% oxygen Induction Type: IV induction LMA: LMA inserted LMA Size: 4.0 Tube type: Oral Number of attempts: 1 Placement Confirmation: positive ETCO2 and breath sounds checked- equal and bilateral Tube secured with: Tape Dental Injury: Teeth and Oropharynx as per pre-operative assessment        

## 2020-11-17 NOTE — Transfer of Care (Signed)
Immediate Anesthesia Transfer of Care Note  Patient: Jeffrey Holmes  Procedure(s) Performed: CYSTOSCOPY/URETEROSCOPY/HOLMIUM LASER/STENT PLACEMENT (Bilateral )  Patient Location: PACU  Anesthesia Type:General  Level of Consciousness: awake, alert  and oriented  Airway & Oxygen Therapy: Patient Spontanous Breathing and Patient connected to face mask  Post-op Assessment: Report given to RN and Post -op Vital signs reviewed and stable  Post vital signs: Reviewed and stable  Last Vitals:  Vitals Value Taken Time  BP 143/72 11/17/20 1834  Temp    Pulse 68 11/17/20 1837  Resp 22 11/17/20 1837  SpO2 100 % 11/17/20 1837  Vitals shown include unvalidated device data.  Last Pain:  Vitals:   11/17/20 1514  TempSrc: Oral  PainSc: 0-No pain         Complications: No complications documented.

## 2020-11-17 NOTE — Op Note (Signed)
Operative Note  Preoperative diagnosis:  1.  Right ureteral calculus 2.  Possible left ureteral calculus 3.  Left renal atrophy 4.  Acute renal insufficiency  Post operative diagnosis: 1.  Right ureteral calculus 2.  Possible left ureteral calculus with mild hydronephrosis 3.  Left renal atrophy 4.  Acute renal insufficiency  Procedure(s): 1.  Cystoscopy with bilateral retrograde pyelogram and bilateral ureteral stent placement  Surgeon: Link Snuffer, MD  Assistants: None  Anesthesia: General  Complications: None immediate  EBL: Minimal  Specimens: 1.  None  Drains/Catheters: 1. 6 x 24 double-J ureteral stent bilaterally 2.  16 French Foley catheter  Intraoperative findings: 1.  Normal anterior urethra with bilobar prostatic hypertrophy and normal bladder 2.  Left retrograde pyelogram revealed a filling defect at the level of the mid ureter consistent with possible stone with upstream mild hydronephrosis.  Right retrograde pyelogram revealed a filling defect at the level of the stone with upstream severe hydroureteronephrosis  Indication: 70 year old male with a extensive history of nephrolithiasis.  He has a history of left renal atrophy.  He was seen earlier this week in the office with a right ureteral calculus.  He presented again today with left-sided flank pain and discomfort.  Given his left renal atrophy and pain bilaterally, I decided to take him to the operating room for urgent intervention.  Prior to the procedure, creatinine returned above 6 and for that reason I decided to place ureteral stents to allow for renal recovery prior to definitive management of the stones.  Description of procedure:  The patient was identified and consent was obtained.  The patient was taken to the operating room and placed in the supine position.  The patient was placed under general anesthesia.  Perioperative antibiotics were administered.  The patient was placed in dorsal lithotomy.   Patient was prepped and draped in a standard sterile fashion and a timeout was performed.  A 21 French rigid cystoscope was advanced into the urethra and into the bladder.  The left distal most portion of the ureter was cannulated with an open-ended ureteral catheter.  Retrograde pyelogram was performed with the findings noted above.  A sensor wire was then advanced up to the kidney under fluoroscopic guidance.  A 6 X 24 double-J ureteral stent was advanced up to the kidney under fluoroscopic guidance.  The wire was withdrawn and fluoroscopy confirmed good proximal placement and direct visualization confirmed a good coil within the bladder.    The right distal most portion of the ureter was cannulated with an open-ended ureteral catheter.  Retrograde pyelogram was performed with the findings noted above.  A sensor wire was then advanced up to the kidney under fluoroscopic guidance.  A 6 X 24 double-J ureteral stent was advanced up to the kidney under fluoroscopic guidance.  The wire was withdrawn and fluoroscopy confirmed good proximal placement and direct visualization confirmed a good coil within the bladder.   The scope was withdrawn and a 16 French Foley catheter was placed.  This concluded the operation.  Patient tolerated procedure well and was stable postoperatively.  Plan: Admit the patient and follow renal function.  He will need definitive ureteroscopy in a couple of weeks after a period of recovery.

## 2020-11-17 NOTE — Anesthesia Postprocedure Evaluation (Signed)
Anesthesia Post Note  Patient: BROLY HATFIELD  Procedure(s) Performed: CYSTOSCOPY/URETEROSCOPY/HOLMIUM LASER/STENT PLACEMENT (Bilateral )     Patient location during evaluation: PACU Anesthesia Type: General Level of consciousness: awake and alert Pain management: pain level controlled Vital Signs Assessment: post-procedure vital signs reviewed and stable Respiratory status: spontaneous breathing, nonlabored ventilation and respiratory function stable Cardiovascular status: blood pressure returned to baseline and stable Postop Assessment: no apparent nausea or vomiting Anesthetic complications: no   No complications documented.  Last Vitals:  Vitals:   11/17/20 1915 11/17/20 1930  BP: (!) 186/90 (!) 183/89  Pulse: 70 65  Resp: 18 (!) 24  Temp:  36.8 C  SpO2: 96% 98%    Last Pain:  Vitals:   11/17/20 1930  TempSrc:   PainSc: 0-No pain                 Lynda Rainwater

## 2020-11-18 ENCOUNTER — Other Ambulatory Visit: Payer: Self-pay

## 2020-11-18 ENCOUNTER — Encounter (HOSPITAL_COMMUNITY): Payer: Self-pay | Admitting: Urology

## 2020-11-18 LAB — BASIC METABOLIC PANEL
Anion gap: 10 (ref 5–15)
BUN: 43 mg/dL — ABNORMAL HIGH (ref 8–23)
CO2: 21 mmol/L — ABNORMAL LOW (ref 22–32)
Calcium: 8.5 mg/dL — ABNORMAL LOW (ref 8.9–10.3)
Chloride: 115 mmol/L — ABNORMAL HIGH (ref 98–111)
Creatinine, Ser: 4.46 mg/dL — ABNORMAL HIGH (ref 0.61–1.24)
GFR, Estimated: 13 mL/min — ABNORMAL LOW (ref >60)
Glucose, Bld: 186 mg/dL — ABNORMAL HIGH (ref 70–99)
Potassium: 5 mmol/L (ref 3.5–5.1)
Sodium: 146 mmol/L — ABNORMAL HIGH (ref 135–145)

## 2020-11-18 LAB — GLUCOSE, CAPILLARY
Glucose-Capillary: 143 mg/dL — ABNORMAL HIGH (ref 70–99)
Glucose-Capillary: 137 mg/dL — ABNORMAL HIGH (ref 70–99)
Glucose-Capillary: 144 mg/dL — ABNORMAL HIGH (ref 70–99)
Glucose-Capillary: 153 mg/dL — ABNORMAL HIGH (ref 70–99)

## 2020-11-18 LAB — HEMOGLOBIN A1C
Hgb A1c MFr Bld: 8.1 % — ABNORMAL HIGH (ref 4.8–5.6)
Mean Plasma Glucose: 185.77 mg/dL

## 2020-11-18 MED ORDER — GATIFLOXACIN 0.5 % OP SOLN: 1.0000 [drp] | Freq: Every day | OPHTHALMIC | Status: DC

## 2020-11-18 MED ORDER — TIMOLOL MALEATE 0.5 % OP SOLN: 1.0000 [drp] | Freq: Every day | OPHTHALMIC | Status: DC

## 2020-11-18 MED ORDER — DIFLUPREDNATE 0.05 % OP EMUL: 1.0000 [drp] | Freq: Every day | OPHTHALMIC | Status: DC

## 2020-11-18 MED ORDER — CHLORHEXIDINE GLUCONATE CLOTH 2 % EX PADS
6.0000 | MEDICATED_PAD | Freq: Every day | CUTANEOUS | Status: DC
  Administered 2020-11-18: 6 via TOPICAL

## 2020-11-18 NOTE — Plan of Care (Signed)
  Problem: Elimination: Goal: Will not experience complications related to bowel motility Outcome: Progressing Goal: Will not experience complications related to urinary retention Outcome: Progressing   

## 2020-11-18 NOTE — Progress Notes (Signed)
Urology Inpatient Progress Report  AKI (acute kidney injury) ([place]) [N17.9]  Procedure(s): CYSTOSCOPY/URETEROSCOPY/HOLMIUM LASER/STENT PLACEMENT  1 Day Post-Op   Intv/Subj: No acute events overnight. Patient is without complaint.  Creatinine improving.  Excellent urine output secondary to postobstructive diuresis.  Active Problems:   AKI (acute kidney injury) ([place])  Current Facility-Administered Medications  Medication Dose Route Frequency Provider Last Rate Last Admin  . 0.9 %  sodium chloride infusion   Intravenous Continuous Marton Redwood III, MD 125 mL/hr at 11/17/20 2245 New Bag at 11/17/20 2245  . acetaminophen (TYLENOL) tablet 650 mg  650 mg Oral Q4H PRN Marton Redwood III, MD      . amLODipine (NORVASC) tablet 10 mg  10 mg Oral Daily Marton Redwood III, MD   10 mg at 11/18/20 0939  . atorvastatin (LIPITOR) tablet 80 mg  80 mg Oral Daily Marton Redwood III, MD   80 mg at 11/18/20 0939  . belladonna-opium (B&O) suppository 16.2-60mg   1 suppository Rectal Q6H PRN Marton Redwood III, MD      . Chlorhexidine Gluconate Cloth 2 % PADS 6 each  6 each Topical Daily Marton Redwood III, MD      . clopidogrel (PLAVIX) tablet 75 mg  75 mg Oral Daily Marton Redwood III, MD   75 mg at 11/18/20 [phone]  . cyclobenzaprine (FLEXERIL) tablet 10 mg  10 mg Oral TID PRN Marton Redwood III, MD      . Difluprednate 0.05 % EMUL 1 drop  1 drop Right Eye TID Marton Redwood III, MD      . diphenhydrAMINE (BENADRYL) injection 12.5 mg  12.5 mg Intravenous Q6H PRN Marton Redwood III, MD       Or  . diphenhydrAMINE (BENADRYL) 12.5 MG/5ML elixir 12.5 mg  12.5 mg Oral Q6H PRN Marton Redwood III, MD      . docusate sodium (COLACE) capsule 100 mg  100 mg Oral BID Marton Redwood III, MD   100 mg at 11/17/20 2238  . gatifloxacin (ZYMAXID) 0.5 % ophthalmic drops 1 drop  1 drop Right Eye QID Marton Redwood III, MD   1 drop at 11/18/20 0948  . insulin aspart (novoLOG) injection 0-15 Units  0-15 Units Subcutaneous TID  WC Marton Redwood III, MD   2 Units at 11/18/20 0940  . ketorolac (ACULAR) 0.5 % ophthalmic solution 1 drop  1 drop Left Eye Daily Marton Redwood III, MD      . latanoprost (XALATAN) 0.005 % ophthalmic solution 1 drop  1 drop Both Eyes QHS Marton Redwood III, MD   1 drop at 11/17/20 2236  . metoprolol succinate (TOPROL-XL) 24 hr tablet 50 mg  50 mg Oral Daily Marton Redwood III, MD   50 mg at 11/18/20 [phone]  . montelukast (SINGULAIR) tablet 10 mg  10 mg Oral QHS Marton Redwood III, MD   10 mg at 11/17/20 2238  . morphine 2 MG/ML injection 2-4 mg  2-4 mg Intravenous Q2H PRN Marton Redwood III, MD      . neomycin-bacitracin-polymyxin (NEOSPORIN) ointment packet 1 application  1 application Topical TID PRN Marton Redwood III, MD      . ondansetron [facility]) injection 4 mg  4 mg Intravenous Q4H PRN Marton Redwood III, MD      . oxybutynin (DITROPAN) tablet 5 mg  5 mg Oral Q8H PRN Lucas Mallow, MD      .  pantoprazole (PROTONIX) EC tablet 40 mg  40 mg Oral Daily PRN Marton Redwood III, MD      . senna [facility]) tablet 8.6 mg  1 tablet Oral BID Marton Redwood III, MD   8.6 mg at 11/17/20 2238  . tamsulosin (FLOMAX) capsule 0.4 mg  0.4 mg Oral Daily Marton Redwood III, MD   0.4 mg at 11/18/20 0940  . timolol (TIMOPTIC) 0.5 % ophthalmic solution 1 drop  1 drop Both Eyes BID Marton Redwood III, MD   1 drop at 11/18/20 0947  . traMADol (ULTRAM) tablet 50 mg  50 mg Oral Q6H PRN Marton Redwood III, MD      . Travoprost (BAK Free) (TRAVATAN) 0.004 % ophthalmic solution SOLN 1 drop  1 drop Both Eyes QHS Marton Redwood III, MD      . zolpidem (AMBIEN) tablet 5 mg  5 mg Oral QHS PRN Marton Redwood III, MD         Objective: Vital: Vitals:   11/17/20 2011 11/17/20 2349 11/18/20 0540 11/18/20 0803  BP: (!) 185/82 (!) 154/78 (!) 165/73 (!) 149/84  Pulse: 63 77 63 (!) 58  Resp: 17 17 19 16   Temp: 98.3 F (36.8 C) 98.4 F (36.9 C) 98 F (36.7 C) 98.3 F (36.8 C)  TempSrc: Oral Oral Oral   SpO2: 97% 97%  98% 98%  Weight: 87.6 kg     Height: 5\' 5"  (1.651 m)      I/Os: I/O last 3 completed shifts: In: 1397 [I.V.:1397] Out: 2025 [Urine:2025]  Physical Exam:  General: Patient is in no apparent distress Lungs: Normal respiratory effort, chest expands symmetrically. GI: The abdomen is soft and nontender without mass. Foley: Draining light pink urine Ext: lower extremities symmetric  Lab Results: Recent Labs    11/17/20 1505  WBC 11.4*  HGB 17.2*  HCT 50.3   Recent Labs    11/17/20 1505 11/18/20 0535  NA 145 146*  K 4.3 5.0  CL 116* 115*  CO2 19* 21*  GLUCOSE 109* 186*  BUN 53* 43*  CREATININE 6.13* 4.46*  CALCIUM 8.7* 8.5*   No results for input(s): LABPT, INR in the last 72 hours. No results for input(s): LABURIN in the last 72 hours. Results for orders placed or performed during the hospital encounter of 11/17/20  SARS Coronavirus 2 by RT PCR (hospital order, performed in [facility] hospital lab) Nasopharyngeal Nasopharyngeal Swab     Status: None   Collection Time: 11/17/20  2:48 PM   Specimen: Nasopharyngeal Swab  Result Value Ref Range Status   SARS Coronavirus 2 NEGATIVE NEGATIVE Final    Comment: (NOTE) SARS-CoV-2 target nucleic acids are NOT DETECTED.  The SARS-CoV-2 RNA is generally detectable in upper and lower respiratory specimens during the acute phase of infection. The lowest concentration of SARS-CoV-2 viral copies this assay can detect is 250 copies / mL. A negative result does not preclude SARS-CoV-2 infection and should not be used as the sole basis for treatment or other patient management decisions.  A negative result may occur with improper specimen collection / handling, submission of specimen other than nasopharyngeal swab, presence of viral mutation(s) within the areas targeted by this assay, and inadequate number of viral copies (<250 copies / mL). A negative result must be combined with clinical observations, patient history, and  epidemiological information.  Fact Sheet for Patients:   StrictlyIdeas.no  Fact Sheet for Healthcare Providers: BankingDealers.co.za  This test is not  yet approved or  cleared by the [place] and has been authorized for detection and/or diagnosis of SARS-CoV-2 by FDA under an Emergency Use Authorization (EUA).  This EUA will remain in effect (meaning this test can be used) for the duration of the COVID-19 declaration under Section 564(b)(1) of the Act, 21 U.S.C. section 360bbb-3(b)(1), unless the authorization is terminated or revoked sooner.  Performed at [facility], [place] [address], [place], [place] [address]     Studies/Results: DG C-Arm 1-60 Min-No Report  Result Date: 11/17/2020 Fluoroscopy was utilized by the requesting physician.  No radiographic interpretation.    Assessment: Right ureteral calculus with possible left ureteral obstruction status post bilateral ureteral stent placement  Procedure(s): CYSTOSCOPY/URETEROSCOPY/HOLMIUM LASER/STENT PLACEMENT, 1 Day Post-Op  doing well.  Plan: Continue Foley catheter today.  If renal function improves adequately tomorrow, we will plan to discharge but undergo a voiding trial first.  He will need to be scheduled for bilateral ureteroscopy with laser lithotripsy and ureteral stent exchange.  Continue fluids today for repletion given his postobstructive diuresis and recheck BMP tomorrow   Link Snuffer, MD Urology 11/18/2020, 12:21 PM

## 2020-11-19 LAB — BASIC METABOLIC PANEL
Anion gap: 8 (ref 5–15)
BUN: 32 mg/dL — ABNORMAL HIGH (ref 8–23)
CO2: 18 mmol/L — ABNORMAL LOW (ref 22–32)
Calcium: 7.6 mg/dL — ABNORMAL LOW (ref 8.9–10.3)
Chloride: 120 mmol/L — ABNORMAL HIGH (ref 98–111)
Creatinine, Ser: 2.7 mg/dL — ABNORMAL HIGH (ref 0.61–1.24)
GFR, Estimated: 24 mL/min — ABNORMAL LOW (ref >60)
Glucose, Bld: 115 mg/dL — ABNORMAL HIGH (ref 70–99)
Potassium: 4.5 mmol/L (ref 3.5–5.1)
Sodium: 146 mmol/L — ABNORMAL HIGH (ref 135–145)

## 2020-11-19 LAB — GLUCOSE, CAPILLARY
Glucose-Capillary: 108 mg/dL — ABNORMAL HIGH (ref 70–99)
Glucose-Capillary: 157 mg/dL — ABNORMAL HIGH (ref 70–99)

## 2020-11-19 NOTE — Progress Notes (Signed)
Vs denies pain,, no distress, held stools softner medicine r/t loose stools x 1 this shift. Eye drops medicine given according to pt schedule. Pharmacy was notified for med schedule adjustment.Foley intact patent total 2000 ml amber color urine ouptut. Will cont to monitor.

## 2020-11-19 NOTE — Discharge Summary (Signed)
Physician Discharge Summary  Patient ID: Jeffrey Holmes MRN: [id number] DOB/AGE: 71-Jan-1952 71 y.o.  Admit date: 11/17/2020 Discharge date: 11/19/2020  Admission Diagnoses:  Discharge Diagnoses:  Active Problems:   AKI (acute kidney injury) [facility])   Discharged Condition: good  Hospital Course: Patient had an obstructing right ureteral stone with an atrophic left kidney.  He also developed some left-sided flank pain.  He also had some bilateral nephrolithiasis.  Creatinine on admission was 6 and therefore he underwent bilateral ureteral stent placement.  He remained under observation with Foley catheter.  Creatinine responded appropriately and at the time of discharge the creatinine was 2.7.  He had had excellent urine output.  He was discharged after voiding trial.  Consults: None  Significant Diagnostic Studies: labs: Creatinine returned close to baseline  Treatments: IV hydration, surgery as above  Discharge Exam: Blood pressure (!) 166/78, pulse (!) 58, temperature 98.2 F (36.8 C), temperature source Oral, resp. rate 16, height 5\' 5"  (1.651 m), weight 87.6 kg, SpO2 98 %. General appearance: alert no acute distress Adequate perfusion of extremities Nonlabored respiration Abdomen soft, nontender, nondistended Foley catheter draining clear yellow urine  Disposition: Discharge disposition: 01-Home or Self Care       Discharge Instructions    No wound care   Complete by: As directed      Allergies as of 11/19/2020      Reactions   Vicodin [hydrocodone-acetaminophen] Nausea And Vomiting   Hydrocodone Nausea And Vomiting   Oxycontin [oxycodone Hcl] Nausea And Vomiting   Percocet [oxycodone-acetaminophen] Nausea And Vomiting      Medication List    TAKE these medications   amLODipine 10 MG tablet Commonly known as: NORVASC Take 1 tablet (10 mg total) by mouth daily.   atorvastatin 80 MG tablet Commonly known as: LIPITOR Take 1 tablet by mouth once daily   Basaglar  KwikPen 100 UNIT/ML Inject 0.2 mLs (20 Units total) into the skin at bedtime. What changed: how much to take   clopidogrel 75 MG tablet Commonly known as: PLAVIX Take 1 tablet by mouth once daily   cyclobenzaprine 10 MG tablet Commonly known as: FLEXERIL Take 1 tablet by mouth 3 (three) times daily as needed for muscle spasms.   Durezol 0.05 % Emul Generic drug: Difluprednate Place 1 drop into the left eye daily. Use until bottle is empty   fenofibrate 48 MG tablet Commonly known as: TRICOR Take 1 tablet (48 mg total) by mouth daily.   gatifloxacin 0.5 % Soln Commonly known as: ZYMAXID Place 1 drop into the left eye daily. Use until bottle is empty   insulin lispro 100 UNIT/ML injection Commonly known as: HUMALOG Inject 10-20 Units into the skin daily as needed for high blood sugar. 10-20 on sliding scale   latanoprost 0.005 % ophthalmic solution Commonly known as: XALATAN Place 1 drop into both eyes at bedtime.   lisinopril 20 MG tablet Commonly known as: ZESTRIL Take 1 tablet by mouth daily.   loratadine 10 MG tablet Commonly known as: CLARITIN Take 10 mg by mouth daily as needed for allergies.   metoprolol succinate 50 MG 24 hr tablet Commonly known as: TOPROL-XL Take 1 tablet by mouth twice daily What changed: when to take this   montelukast 10 MG tablet Commonly known as: SINGULAIR Take 1 tablet by mouth at bedtime.   nitroGLYCERIN 0.4 MG SL tablet Commonly known as: NITROSTAT DISSOLVE ONE TABLET UNDER THE TONGUE EVERY 5 MINUTES AS NEEDED FOR CHEST PAIN.  DO NOT  EXCEED A TOTAL OF 3 DOSES IN 15 MINUTES NOW   ondansetron 4 MG tablet Commonly known as: ZOFRAN Take 4 mg by mouth every 4 (four) hours as needed for nausea or vomiting.   pantoprazole 40 MG tablet Commonly known as: PROTONIX Take 1 tablet (40 mg total) by mouth daily before breakfast. What changed:   when to take this  reasons to take this   Prolensa 0.07 % Soln Generic drug: Bromfenac  Sodium Place 1 drop into the left eye daily. Use until bottle is empty   Repatha SureClick 953 MG/ML Soaj Generic drug: Evolocumab Inject 1 pen into the skin every 14 (fourteen) days.   tadalafil 10 MG tablet Commonly known as: CIALIS Take 10 mg by mouth daily as needed for erectile dysfunction.   tamsulosin 0.4 MG Caps capsule Commonly known as: FLOMAX Take 0.4 mg by mouth daily.   timolol 0.5 % ophthalmic solution Commonly known as: TIMOPTIC Place 1 drop into both eyes 2 (two) times daily.   traMADol 50 MG tablet Commonly known as: ULTRAM Take 50 mg by mouth every 6 (six) hours as needed for moderate pain.   Vascepa 1 g capsule Generic drug: icosapent Ethyl Take 2 capsules (2 g total) by mouth 2 (two) times daily.        Signed: Marton Redwood, III 11/19/2020, 11:14 AM

## 2020-11-19 NOTE — Progress Notes (Signed)
Pt leaving at this time with his spouse. Alert and oriented. Pt without c/o. Discharge instructions given/explained with pt verbalizing understanding.  Pt aware of followup with [facility].

## 2020-11-22 ENCOUNTER — Other Ambulatory Visit: Payer: Self-pay | Admitting: Urology

## 2020-11-22 ENCOUNTER — Telehealth: Payer: Self-pay | Admitting: Cardiology

## 2020-11-22 NOTE — Telephone Encounter (Signed)
Left message for the patient to call back and speak to the on-call preop APP of the today.  Note, patient recently underwent cystoscopy with bilateral retrograde pyelogram and bilateral ureteral stent placement by Dr. Link Snuffer on 11/17/2020.  He is pending more definitive ureteroscopy.  Patient is a 70 year old male with past medical history of multivessel CAD s/p CABG, hypertension, hyperlipidemia, and CKD stage III.  Last Myoview in June 2020 was low risk.  He was last seen by Dr. Radford Pax on 01/17/2020 at which time he was doing well.  Dr. Radford Pax, would you be okay from your perspective to hold the Plavix for 5 days prior to upcoming ureteroscopy?  Please forward your response to P CV DIV PREOP

## 2020-11-22 NOTE — Telephone Encounter (Signed)
   [facility] Pre-operative Risk Assessment    Patient Name: Jeffrey Holmes   [facility] STAFF: - Please ensure there is not already an duplicate clearance open for this procedure. - Under Visit Info/Reason for Call, type in Other and utilize the format Clearance MM/DD/YY or Clearance TBD. Do not use dashes or single digits. - If request is for dental extraction, please clarify the # of teeth to be extracted.  Request for surgical clearance:  1. What type of surgery is being performed? Ureteroscopy    2. When is this surgery scheduled? 12/05/2020  3. What type of clearance is required (medical clearance vs. Pharmacy clearance to hold med vs. Both)? Both  4. Are there any medications that need to be held prior to surgery and how long? Hold plavix 5 days  5. Practice name and name of physician performing surgery? [facility], Dr. [name]  6. What is the office phone number? [phone]   7.   What is the office fax number? [phone]  8.   Anesthesia type (None, local, MAC, general) ? general   Selena Zobro 11/22/2020, 1:34 PM  _________________________________________________________________   (provider comments below)

## 2020-11-23 NOTE — Telephone Encounter (Signed)
   Primary Cardiologist: Fransico Him, MD  Chart reviewed as part of pre-operative protocol coverage. Given past medical history and time since last visit, based on ACC/AHA guidelines, Jeffrey Holmes would be at acceptable risk for the planned procedure without further cardiovascular testing.   His Plavix may be held for 5 days prior to his procedure please resume as soon as hemostasis is achieved at the discretion of the surgeon.  I will route this recommendation to the requesting party via Epic fax function and remove from pre-op pool.  Please call with questions.  Jossie Ng. Chenoa Luddy NP-C    11/23/2020, 8:53 AM [place] [address] Office [phone] Fax [phone]

## 2020-11-28 NOTE — Progress Notes (Signed)
DUE TO COVID-19 ONLY ONE VISITOR IS ALLOWED TO COME WITH YOU AND STAY IN THE WAITING ROOM ONLY DURING PRE OP AND PROCEDURE DAY OF SURGERY. THE 1 VISITOR  MAY VISIT WITH YOU AFTER SURGERY IN YOUR PRIVATE ROOM DURING VISITING HOURS ONLY!  YOU NEED TO HAVE A COVID 19 TEST ON___5/6/22 ____ @_______ , THIS TEST MUST BE DONE BEFORE SURGERY,  COVID TESTING SITE [address] [place] [place] [address], IT IS ON THE RIGHT GOING OUT [address] APPROXIMATELY  2 MINUTES PAST [employer] ON THE RIGHT. ONCE YOUR COVID TEST IS COMPLETED,  PLEASE BEGIN THE QUARANTINE INSTRUCTIONS AS OUTLINED IN YOUR HANDOUT.                BLESS BELSHE  11/28/2020   Your procedure is scheduled on:     12/05/2020   Report to [facility] Main  Entrance   Report to admitting at    0930 AM     Call this number if you have problems the morning of surgery [phone]    Remember: Do not eat food , candy gum or mints :After Midnight. You may have clear liquids from midnight until   0830am     CLEAR LIQUID DIET   Foods Allowed                                                                       Coffee and tea, regular and decaf                              Plain Jell-O any favor except red or purple                                            Fruit ices (not with fruit pulp)                                      Iced Popsicles                                     Carbonated beverages, regular and diet                                    Cranberry, grape and apple juices Sports drinks like Gatorade Lightly seasoned clear broth or consume(fat free) Sugar, honey syrup   _____________________________________________________________________    BRUSH YOUR TEETH MORNING OF SURGERY AND RINSE YOUR MOUTH OUT, NO CHEWING GUM CANDY OR MINTS.    Eat a good healthy snack prior to bedtime.    Take these medicines the morning of surgery with A SIP OF WATER:  Amlodipine, Claritin if neede, toprol, eye drops as  usual, protoniix if needed,    Take 1/2 OF Basaglar Insulin nite before surgery.   DO NOT TAKE ANY DIABETIC MEDICATIONS DAY OF YOUR SURGERY  You may not have any metal on your body including hair pins and              piercings  Do not wear jewelry, make-up, lotions, powders or perfumes, deodorant             Do not wear nail polish on your fingernails.  Do not shave  48 hours prior to surgery.              Men may shave face and neck.   Do not bring valuables to the hospital. [place].  Contacts, dentures or bridgework may not be worn into surgery.  Leave suitcase in the car. After surgery it may be brought to your room.     Patients discharged the day of surgery will not be allowed to drive home. IF YOU ARE HAVING SURGERY AND GOING HOME THE SAME DAY, YOU MUST HAVE AN ADULT TO DRIVE YOU HOME AND BE WITH YOU FOR 24 HOURS. YOU MAY GO HOME BY TAXI OR UBER OR ORTHERWISE, BUT AN ADULT MUST ACCOMPANY YOU HOME AND STAY WITH YOU FOR 24 HOURS.  Name and phone number of your driver:  Special Instructions: N/A              Please read over the following fact sheets you were given: _____________________________________________________________________  [facility] - Preparing for Surgery Before surgery, you can play an important role.  Because skin is not sterile, your skin needs to be as free of germs as possible.  You can reduce the number of germs on your skin by washing with CHG (chlorahexidine gluconate) soap before surgery.  CHG is an antiseptic cleaner which kills germs and bonds with the skin to continue killing germs even after washing. Please DO NOT use if you have an allergy to CHG or antibacterial soaps.  If your skin becomes reddened/irritated stop using the CHG and inform your nurse when you arrive at Short Stay. Do not shave (including legs and underarms) for at least 48 hours prior to the first CHG shower.   You may shave your face/neck. Please follow these instructions carefully:  1.  Shower with CHG Soap the night before surgery and the  morning of Surgery.  2.  If you choose to wash your hair, wash your hair first as usual with your  normal  shampoo.  3.  After you shampoo, rinse your hair and body thoroughly to remove the  shampoo.                           4.  Use CHG as you would any other liquid soap.  You can apply chg directly  to the skin and wash                       Gently with a scrungie or clean washcloth.  5.  Apply the CHG Soap to your body ONLY FROM THE NECK DOWN.   Do not use on face/ open                           Wound or open sores. Avoid contact with eyes, ears mouth and genitals (private parts).  Wash face,  Genitals (private parts) with your normal soap.             6.  Wash thoroughly, paying special attention to the area where your surgery  will be performed.  7.  Thoroughly rinse your body with warm water from the neck down.  8.  DO NOT shower/wash with your normal soap after using and rinsing off  the CHG Soap.                9.  Pat yourself dry with a clean towel.            10.  Wear clean pajamas.            11.  Place clean sheets on your bed the night of your first shower and do not  sleep with pets. Day of Surgery : Do not apply any lotions/deodorants the morning of surgery.  Please wear clean clothes to the hospital/surgery center.  FAILURE TO FOLLOW THESE INSTRUCTIONS MAY RESULT IN THE CANCELLATION OF YOUR SURGERY PATIENT SIGNATURE_________________________________  NURSE SIGNATURE__________________________________  ________________________________________________________________________

## 2020-11-30 DIAGNOSIS — Z794 Long term (current) use of insulin: Secondary | ICD-10-CM | POA: Diagnosis not present

## 2020-11-30 DIAGNOSIS — E1151 Type 2 diabetes mellitus with diabetic peripheral angiopathy without gangrene: Secondary | ICD-10-CM | POA: Diagnosis not present

## 2020-11-30 DIAGNOSIS — I2510 Atherosclerotic heart disease of native coronary artery without angina pectoris: Secondary | ICD-10-CM | POA: Diagnosis not present

## 2020-11-30 DIAGNOSIS — I10 Essential (primary) hypertension: Secondary | ICD-10-CM | POA: Diagnosis not present

## 2020-11-30 DIAGNOSIS — N1832 Chronic kidney disease, stage 3b: Secondary | ICD-10-CM | POA: Diagnosis not present

## 2020-11-30 DIAGNOSIS — E785 Hyperlipidemia, unspecified: Secondary | ICD-10-CM | POA: Diagnosis not present

## 2020-12-01 ENCOUNTER — Other Ambulatory Visit: Payer: Self-pay

## 2020-12-01 ENCOUNTER — Encounter (HOSPITAL_COMMUNITY)
Admission: RE | Admit: 2020-12-01 | Discharge: 2020-12-01 | Disposition: A | Discharge status: Home / Self Care | Payer: Medicare Other | Source: Ambulatory Visit | Attending: Urology | Admitting: Urology

## 2020-12-01 ENCOUNTER — Other Ambulatory Visit (HOSPITAL_COMMUNITY)
Admission: RE | Admit: 2020-12-01 | Discharge: 2020-12-01 | Disposition: A | Discharge status: Home / Self Care | Payer: Medicare Other | Source: Ambulatory Visit | Attending: Urology | Admitting: Urology

## 2020-12-01 ENCOUNTER — Encounter (HOSPITAL_COMMUNITY): Payer: Self-pay

## 2020-12-01 DIAGNOSIS — K760 Fatty (change of) liver, not elsewhere classified: Secondary | ICD-10-CM | POA: Insufficient documentation

## 2020-12-01 DIAGNOSIS — Z7902 Long term (current) use of antithrombotics/antiplatelets: Secondary | ICD-10-CM | POA: Diagnosis not present

## 2020-12-01 DIAGNOSIS — I2510 Atherosclerotic heart disease of native coronary artery without angina pectoris: Secondary | ICD-10-CM | POA: Diagnosis not present

## 2020-12-01 DIAGNOSIS — Z79899 Other long term (current) drug therapy: Secondary | ICD-10-CM | POA: Insufficient documentation

## 2020-12-01 DIAGNOSIS — E1122 Type 2 diabetes mellitus with diabetic chronic kidney disease: Secondary | ICD-10-CM | POA: Insufficient documentation

## 2020-12-01 DIAGNOSIS — Z791 Long term (current) use of non-steroidal anti-inflammatories (NSAID): Secondary | ICD-10-CM | POA: Insufficient documentation

## 2020-12-01 DIAGNOSIS — Z951 Presence of aortocoronary bypass graft: Secondary | ICD-10-CM | POA: Diagnosis not present

## 2020-12-01 DIAGNOSIS — Z794 Long term (current) use of insulin: Secondary | ICD-10-CM | POA: Diagnosis not present

## 2020-12-01 DIAGNOSIS — Z20822 Contact with and (suspected) exposure to covid-19: Secondary | ICD-10-CM | POA: Insufficient documentation

## 2020-12-01 DIAGNOSIS — N183 Chronic kidney disease, stage 3 unspecified: Secondary | ICD-10-CM | POA: Insufficient documentation

## 2020-12-01 DIAGNOSIS — Z01812 Encounter for preprocedural laboratory examination: Secondary | ICD-10-CM | POA: Insufficient documentation

## 2020-12-01 DIAGNOSIS — K219 Gastro-esophageal reflux disease without esophagitis: Secondary | ICD-10-CM | POA: Diagnosis not present

## 2020-12-01 DIAGNOSIS — N201 Calculus of ureter: Secondary | ICD-10-CM | POA: Diagnosis not present

## 2020-12-01 HISTORY — DX: Nausea with vomiting, unspecified: R11.2

## 2020-12-01 HISTORY — DX: Other specified postprocedural states: Z98.890

## 2020-12-01 LAB — CBC
HCT: 46.8 % (ref 39.0–52.0)
Hemoglobin: 15.5 g/dL (ref 13.0–17.0)
MCH: 30.8 pg (ref 26.0–34.0)
MCHC: 33.1 g/dL (ref 30.0–36.0)
MCV: 93 fL (ref 80.0–100.0)
Platelets: 220 10*3/uL (ref 150–400)
RBC: 5.03 MIL/uL (ref 4.22–5.81)
RDW: 13 % (ref 11.5–15.5)
WBC: 8.7 10*3/uL (ref 4.0–10.5)
nRBC: 0 % (ref 0.0–0.2)

## 2020-12-01 LAB — BASIC METABOLIC PANEL
Anion gap: 10 (ref 5–15)
BUN: 21 mg/dL (ref 8–23)
CO2: 23 mmol/L (ref 22–32)
Calcium: 9.6 mg/dL (ref 8.9–10.3)
Chloride: 112 mmol/L — ABNORMAL HIGH (ref 98–111)
Creatinine, Ser: 1.99 mg/dL — ABNORMAL HIGH (ref 0.61–1.24)
GFR, Estimated: 35 mL/min — ABNORMAL LOW (ref >60)
Glucose, Bld: 148 mg/dL — ABNORMAL HIGH (ref 70–99)
Potassium: 5 mmol/L (ref 3.5–5.1)
Sodium: 145 mmol/L (ref 135–145)

## 2020-12-01 LAB — GLUCOSE, CAPILLARY: Glucose-Capillary: 147 mg/dL — ABNORMAL HIGH (ref 70–99)

## 2020-12-01 LAB — SARS CORONAVIRUS 2 (TAT 6-24 HRS): SARS Coronavirus 2: NEGATIVE

## 2020-12-01 NOTE — Progress Notes (Addendum)
Anesthesia Review:  PCP:  Cardiologist : DR Fransico Him  Clearance 11/22/20- Telephone encounter- Annamaria Boots  LOV 01/17/20  Endocrinologist- Dr Rosana Fret Chest x-ray : EKG : 01/17/20  Echo : 2020  Stress test: 2020  Cardiac Cath :  Activity level:  Sleep Study/ CPAP : Fasting Blood Sugar :      / Checks Blood Sugar -- times a day:   Blood Thinner/ Instructions /Last Dose: ASA / Instructions/ Last Dose :  Checks glucose once daily per pt  11/18/20-hgba1c-8.1  BMP done 12/01/20 routed to Dr Jeffie Pollock.   Plavix - To stop 5 days prior per pt  DM- type 2  11/17/20- Cystoscopy

## 2020-12-04 ENCOUNTER — Encounter (HOSPITAL_COMMUNITY): Payer: Self-pay | Admitting: Urology

## 2020-12-04 NOTE — Anesthesia Preprocedure Evaluation (Addendum)
Anesthesia Evaluation  Patient identified by MRN, date of birth, ID band Patient awake    Reviewed: Allergy & Precautions, NPO status , Patient's Chart, lab work & pertinent test results, reviewed documented beta blocker date and time   History of Anesthesia Complications (+) PONV and history of anesthetic complications  Airway Mallampati: II  TM Distance: >3 FB Neck ROM: Full    Dental no notable dental hx. (+) Dental Advisory Given   Pulmonary neg pulmonary ROS,    Pulmonary exam normal breath sounds clear to auscultation       Cardiovascular hypertension, Pt. on medications and Pt. on home beta blockers + CAD, + Cardiac Stents and + CABG  Normal cardiovascular exam Rhythm:Regular Rate:Normal  NM Stress 12/2018  Nuclear stress EF: 63%.  There was no ST segment deviation noted during stress.  No T wave inversion was noted during stress.  The study is normal.  This is a low risk study.      Neuro/Psych negative neurological ROS     GI/Hepatic Neg liver ROS, hiatal hernia, GERD  ,  Endo/Other  negative endocrine ROSdiabetes  Renal/GU Renal disease     Musculoskeletal negative musculoskeletal ROS (+)   Abdominal   Peds  Hematology negative hematology ROS (+)   Anesthesia Other Findings   Reproductive/Obstetrics                                                             Anesthesia Evaluation  Patient identified by MRN, date of birth, ID band Patient awake    Reviewed: Allergy & Precautions, NPO status , Patient's Chart, lab work & pertinent test results, reviewed documented beta blocker date and time   Airway Mallampati: III  TM Distance: >3 FB Neck ROM: Full    Dental  (+) Dental Advisory Given   Pulmonary neg pulmonary ROS,    breath sounds clear to auscultation       Cardiovascular hypertension, Pt. on medications + CAD, + Cardiac Stents and + CABG    Rhythm:Regular Rate:Normal     Neuro/Psych negative neurological ROS     GI/Hepatic Neg liver ROS, hiatal hernia,   Endo/Other  diabetes, Type 2, Insulin Dependent  Renal/GU CRFRenal disease     Musculoskeletal   Abdominal   Peds  Hematology negative hematology ROS (+)   Anesthesia Other Findings   Reproductive/Obstetrics                             Lab Results  Component Value Date   WBC 8.7 12/01/2020   HGB 15.5 12/01/2020   HCT 46.8 12/01/2020   MCV 93.0 12/01/2020   PLT 220 12/01/2020   Lab Results  Component Value Date   CREATININE 1.99 (H) 12/01/2020   BUN 21 12/01/2020   NA 145 12/01/2020   K 5.0 12/01/2020   CL 112 (H) 12/01/2020   CO2 23 12/01/2020    Anesthesia Physical  Anesthesia Plan  ASA: III  Anesthesia Plan: General   Post-op Pain Management:    Induction: Intravenous  PONV Risk Score and Plan: 2 and Ondansetron, Midazolam and Treatment may vary due to age or medical condition  Airway Management Planned: LMA  Additional Equipment:   Intra-op Plan:   Post-operative Plan:  Extubation in OR  Informed Consent: I have reviewed the patients History and Physical, chart, labs and discussed the procedure including the risks, benefits and alternatives for the proposed anesthesia with the patient or authorized representative who has indicated his/her understanding and acceptance.     Dental advisory given  Plan Discussed with: CRNA  Anesthesia Plan Comments:         Anesthesia Quick Evaluation  Anesthesia Physical Anesthesia Plan  ASA: III  Anesthesia Plan: General   Post-op Pain Management:    Induction: Intravenous  PONV Risk Score and Plan: 4 or greater and Ondansetron, Dexamethasone, Treatment may vary due to age or medical condition and Diphenhydramine  Airway Management Planned: LMA  Additional Equipment: None  Intra-op Plan:   Post-operative Plan: Extubation in OR  Informed  Consent: I have reviewed the patients History and Physical, chart, labs and discussed the procedure including the risks, benefits and alternatives for the proposed anesthesia with the patient or authorized representative who has indicated his/her understanding and acceptance.     Dental advisory given  Plan Discussed with: CRNA  Anesthesia Plan Comments: (See PAT note 12/01/2020, Konrad Felix, PA-C)      Anesthesia Quick Evaluation

## 2020-12-04 NOTE — Progress Notes (Signed)
Anesthesia Chart Review   Case: [id number] Date/Time: 12/05/20 1115   Procedure: CYSTOSCOPY BILATERAL /URETEROSCOPY/HOLMIUM LASER/STENT PLACEMENT (Bilateral )   Anesthesia type: General   Pre-op diagnosis: bilateral ureteral stones   Location: [facility] / [facility]   Surgeons: [name], MD      DISCUSSION:70 y.o. never smoker with h/o PONV, GERD, DM II, CAD (CABG), CKD Stage III, NAFLD, bilateral ureteral stones scheduled for above procedure 12/05/2020 with Dr. [name].   Per cardiology preoperative evaluation 11/23/2020, "Chart reviewed as part of pre-operative protocol coverage. Given past medical history and time since last visit, based on ACC/AHA guidelines, Jeffrey Holmes would be at acceptable risk for the planned procedure without further cardiovascular testing.   His Plavix may be held for 5 days prior to his procedure please resume as soon as hemostasis is achieved at the discretion of the surgeon."  Anticipate pt can proceed with planned procedure barring acute status change.   VS: BP (!) 158/88   Pulse 70   Temp 37.1 C (Oral)   Resp 16   Ht 5\' 5"  (1.651 m)   Wt 73 kg   SpO2 97%   BMI 26.79 kg/m   PROVIDERS: Pcp, No  Fransico Him, MD is Cardiologist  LABS: Labs reviewed: Acceptable for surgery. (all labs ordered are listed, but only abnormal results are displayed)  Labs Reviewed  BASIC METABOLIC PANEL - Abnormal; Notable for the following components:      Result Value   Chloride 112 (*)    Glucose, Bld 148 (*)    Creatinine, Ser 1.99 (*)    GFR, Estimated 35 (*)    All other components within normal limits  GLUCOSE, CAPILLARY - Abnormal; Notable for the following components:   Glucose-Capillary 147 (*)    All other components within normal limits  CBC     IMAGES:   EKG: 01/17/20 Rate 54 bpm  Sinus bradycardia   CV: Stress Test 12/31/2018  Nuclear stress EF: 63%.  There was no ST segment deviation noted during stress.  No T wave inversion was noted  during stress.  The study is normal.  This is a low risk study.   Low risk stress nuclear study with normal perfusion and normal left ventricular regional and global systolic function.  Echo 08/21/2015 Study Conclusions   - Left ventricle: Systolic function was normal. The estimated  ejection fraction was in the range of 55% to 60%. Wall motion was  normal; there were no regional wall motion abnormalities.  - Staged echo: Limited post-CABG and stent study: Good LV function,  EF 60-65%. Normal wall motion. No change post bypass in aortic  valve function. No change post bypass in mitral valve function.   Impressions:   - No significant change from pre-bypass images.  Past Medical History:  Diagnosis Date  . Allergy   . Benign essential HTN 08/08/2015  . CAD (coronary artery disease), native coronary artery 08/28/2015   Severe multivessel ASCAD s/p CABG hybrid with LIMA to LAD and diag and PCI of the RCA.   [name] Chronic back pain    L5-S1  . Chronic kidney disease, stage 3 (HCC)    multiple kidney stones  . Diabetes mellitus without complication ([place])    TYPE 2  . GERD (gastroesophageal reflux disease)    OCC  . H/O hiatal hernia   . Hepatic cyst 11/01/2016  . History of acute renal failure 2015   secondary to stones, ELEVATED CREATININE, NO DIALYSIS DONE  .  History of kidney stones   . Hx of adenomatous colonic polyps 06/11/2017  . Hyperlipidemia 08/08/2015  . NAFLD (nonalcoholic fatty liver disease) 11/01/2016  . PONV (postoperative nausea and vomiting)    hx of years ago   . Splenic cyst 11/01/2016  . Ureteral calculus 01/06/2014    Past Surgical History:  Procedure Laterality Date  . ANGIOPLASTY N/A 08/21/2015   Procedure: ANGIOPLASTY WITH PERCUTANEOUS CORONARY INTERVENTION WITH DES TO RIGHT CORONARY ARTERY;  Surgeon: Jettie Booze, MD;  Location: [facility];  Service: Cardiovascular;  Laterality: N/A;  . BACK SURGERY  2006   microdisectomy L5-S1  . CARDIAC  CATHETERIZATION N/A 08/18/2015   Procedure: Left Heart Cath and Coronary Angiography;  Surgeon: Jettie Booze, MD;  Location: [facility];  Service: Cardiovascular;  Laterality: N/A;  . CARDIAC CATHETERIZATION N/A 08/21/2015   Procedure: Coronary Stent Intervention;  Surgeon: Jettie Booze, MD;  Location: [facility];  Service: Cardiovascular;  Laterality: N/A;  . CARDIAC CATHETERIZATION  08/21/2015   Procedure: Bypass Graft Angiography;  Surgeon: Jettie Booze, MD;  Location: [facility];  Service: Cardiovascular;;  . CARDIOVASCULAR STRESS TEST  08/15/2015  . cataract surgery     . COLONOSCOPY    . CORONARY ARTERY BYPASS GRAFT N/A 08/21/2015   Procedure: OFF PUMP CORONARY ARTERY BYPASS GRAFTING (CABG), TIMES TWO, USING LEFT INTERNAL MAMMARY ARTERY, RIGHT GREATER SAPHENOUS VEIN HARVESTED ENDOSCOPICALLY;  Surgeon: [name], MD;  Location: [facility];  Service: Open Heart Surgery;  Laterality: N/A;  . CYSTOSCOPY W/ URETERAL STENT PLACEMENT Right 03/12/2017   Procedure: CYSTOSCOPY WITH RETROGRADE PYELOGRAM/URETERAL STENT PLACEMENT;  Surgeon: Bjorn Loser, MD;  [facility];  Service: Urology;  Laterality: Right;  . CYSTOSCOPY WITH RETROGRADE PYELOGRAM, URETEROSCOPY AND STENT PLACEMENT Right 01/06/2014   Procedure: CYSTOSCOPY WITH RETROGRADE PYELOGRAM, URETEROSCOPY AND STENT PLACEMENT;  Surgeon: Bernestine Amass, MD;  [facility];  Service: Urology;  Laterality: Right;  . CYSTOSCOPY/URETEROSCOPY/HOLMIUM LASER/STENT PLACEMENT Right 03/25/2017   Procedure: RIGHT URETEROSCOPY WITH HOLMIUM LASER STENT EXCHANGE;  Surgeon: [name], MD;  [facility];  Service: Urology;  Laterality: Right;  . CYSTOSCOPY/URETEROSCOPY/HOLMIUM LASER/STENT PLACEMENT Bilateral 11/17/2020   Procedure: CYSTOSCOPY/URETEROSCOPY/HOLMIUM LASER/STENT PLACEMENT;  Surgeon: Lucas Mallow, MD;  [facility];  Service: Urology;  Laterality: Bilateral;  .  ESOPHAGOGASTRODUODENOSCOPY ENDOSCOPY     normal  . LITHOTRIPSY     2-3 times in past  . TEE WITHOUT CARDIOVERSION N/A 08/21/2015   Procedure: TRANSESOPHAGEAL ECHOCARDIOGRAM (TEE);  Surgeon: [name], MD;  Location: [facility];  Service: Open Heart Surgery;  Laterality: N/A;  . TONSILLECTOMY    . WISDOM TOOTH EXTRACTION      MEDICATIONS: . atorvastatin (LIPITOR) 80 MG tablet  . clopidogrel (PLAVIX) 75 MG tablet  . DUREZOL 0.05 % EMUL  . Evolocumab (REPATHA SURECLICK) 563 MG/ML SOAJ  . gatifloxacin (ZYMAXID) 0.5 % SOLN  . Insulin Glargine (BASAGLAR KWIKPEN) 100 UNIT/ML SOPN  . insulin lispro (HUMALOG) 100 UNIT/ML injection  . latanoprost (XALATAN) 0.005 % ophthalmic solution  . lisinopril (ZESTRIL) 20 MG tablet  . loratadine (CLARITIN) 10 MG tablet  . metoprolol succinate (TOPROL-XL) 50 MG 24 hr tablet  . montelukast (SINGULAIR) 10 MG tablet  . nitroGLYCERIN (NITROSTAT) 0.4 MG SL tablet  . ondansetron (ZOFRAN) 4 MG tablet  . pantoprazole (PROTONIX) 40 MG tablet  . PROLENSA 0.07 % SOLN  . tadalafil (CIALIS) 10 MG tablet  . timolol (TIMOPTIC) 0.5 % ophthalmic solution  .  traMADol (ULTRAM) 50 MG tablet   No current facility-administered medications for this encounter.     Konrad Felix, PA-C [facility] [phone]

## 2020-12-05 ENCOUNTER — Encounter (HOSPITAL_COMMUNITY): Payer: Self-pay | Admitting: Urology

## 2020-12-05 ENCOUNTER — Ambulatory Visit (HOSPITAL_COMMUNITY): Payer: Medicare Other | Admitting: Physician Assistant

## 2020-12-05 ENCOUNTER — Encounter (HOSPITAL_COMMUNITY)
Admission: RE | Disposition: A | Discharge status: Home / Self Care | Payer: Self-pay | Source: Home / Self Care | Attending: Urology

## 2020-12-05 ENCOUNTER — Ambulatory Visit (HOSPITAL_COMMUNITY): Payer: Medicare Other | Admitting: Anesthesiology

## 2020-12-05 ENCOUNTER — Ambulatory Visit (HOSPITAL_COMMUNITY)
Admission: RE | Admit: 2020-12-05 | Discharge: 2020-12-05 | Disposition: A | Discharge status: Home / Self Care | Payer: Medicare Other | Attending: Urology | Admitting: Urology

## 2020-12-05 ENCOUNTER — Ambulatory Visit (HOSPITAL_COMMUNITY): Payer: Medicare Other

## 2020-12-05 DIAGNOSIS — N261 Atrophy of kidney (terminal): Secondary | ICD-10-CM | POA: Diagnosis not present

## 2020-12-05 DIAGNOSIS — N183 Chronic kidney disease, stage 3 unspecified: Secondary | ICD-10-CM | POA: Diagnosis not present

## 2020-12-05 DIAGNOSIS — E1122 Type 2 diabetes mellitus with diabetic chronic kidney disease: Secondary | ICD-10-CM | POA: Diagnosis not present

## 2020-12-05 DIAGNOSIS — Z87442 Personal history of urinary calculi: Secondary | ICD-10-CM | POA: Insufficient documentation

## 2020-12-05 DIAGNOSIS — Z7902 Long term (current) use of antithrombotics/antiplatelets: Secondary | ICD-10-CM | POA: Insufficient documentation

## 2020-12-05 DIAGNOSIS — Z794 Long term (current) use of insulin: Secondary | ICD-10-CM | POA: Diagnosis not present

## 2020-12-05 DIAGNOSIS — N202 Calculus of kidney with calculus of ureter: Secondary | ICD-10-CM | POA: Insufficient documentation

## 2020-12-05 DIAGNOSIS — Z79899 Other long term (current) drug therapy: Secondary | ICD-10-CM | POA: Insufficient documentation

## 2020-12-05 DIAGNOSIS — N179 Acute kidney failure, unspecified: Secondary | ICD-10-CM | POA: Diagnosis not present

## 2020-12-05 DIAGNOSIS — Z885 Allergy status to narcotic agent status: Secondary | ICD-10-CM | POA: Diagnosis not present

## 2020-12-05 DIAGNOSIS — Z955 Presence of coronary angioplasty implant and graft: Secondary | ICD-10-CM | POA: Insufficient documentation

## 2020-12-05 DIAGNOSIS — Z951 Presence of aortocoronary bypass graft: Secondary | ICD-10-CM | POA: Insufficient documentation

## 2020-12-05 DIAGNOSIS — I129 Hypertensive chronic kidney disease with stage 1 through stage 4 chronic kidney disease, or unspecified chronic kidney disease: Secondary | ICD-10-CM | POA: Diagnosis not present

## 2020-12-05 HISTORY — PX: CYSTOSCOPY/URETEROSCOPY/HOLMIUM LASER/STENT PLACEMENT: SHX6546

## 2020-12-05 LAB — GLUCOSE, CAPILLARY: Glucose-Capillary: 104 mg/dL — ABNORMAL HIGH (ref 70–99)

## 2020-12-05 SURGERY — CYSTOSCOPY/URETEROSCOPY/HOLMIUM LASER/STENT PLACEMENT
Anesthesia: General | Laterality: Bilateral

## 2020-12-05 MED ORDER — FENTANYL CITRATE (PF) 100 MCG/2ML IJ SOLN
INTRAMUSCULAR | Status: AC
  Filled 2020-12-05: qty 2

## 2020-12-05 MED ORDER — DROPERIDOL 2.5 MG/ML IJ SOLN: 0.6250 mg | Freq: Once | INTRAMUSCULAR | Status: DC | PRN

## 2020-12-05 MED ORDER — EPHEDRINE 5 MG/ML INJ
INTRAVENOUS | Status: AC
  Filled 2020-12-05: qty 20

## 2020-12-05 MED ORDER — EPHEDRINE SULFATE 50 MG/ML IJ SOLN
INTRAMUSCULAR | Status: DC | PRN
  Administered 2020-12-05: 5 mg via INTRAVENOUS
  Administered 2020-12-05: 10 mg via INTRAVENOUS
  Administered 2020-12-05: 5 mg via INTRAVENOUS

## 2020-12-05 MED ORDER — CEFAZOLIN SODIUM-DEXTROSE 2-4 GM/100ML-% IV SOLN
2.0000 g | INTRAVENOUS | Status: AC
  Administered 2020-12-05: 2 g via INTRAVENOUS
  Filled 2020-12-05: qty 100

## 2020-12-05 MED ORDER — ONDANSETRON HCL 4 MG/2ML IJ SOLN
INTRAMUSCULAR | Status: DC | PRN
  Administered 2020-12-05: 4 mg via INTRAVENOUS

## 2020-12-05 MED ORDER — GLYCOPYRROLATE 0.2 MG/ML IJ SOLN
INTRAMUSCULAR | Status: DC | PRN
  Administered 2020-12-05: .2 mg via INTRAVENOUS

## 2020-12-05 MED ORDER — SODIUM CHLORIDE 0.9% FLUSH: 3.0000 mL | Freq: Two times a day (BID) | INTRAVENOUS | Status: DC

## 2020-12-05 MED ORDER — FENTANYL CITRATE (PF) 100 MCG/2ML IJ SOLN: 25.0000 ug | INTRAMUSCULAR | Status: DC | PRN

## 2020-12-05 MED ORDER — LIDOCAINE 2% (20 MG/ML) 5 ML SYRINGE
INTRAMUSCULAR | Status: DC | PRN
  Administered 2020-12-05: 80 mg via INTRAVENOUS

## 2020-12-05 MED ORDER — GLYCOPYRROLATE PF 0.2 MG/ML IJ SOSY
PREFILLED_SYRINGE | INTRAMUSCULAR | Status: AC
  Filled 2020-12-05: qty 1

## 2020-12-05 MED ORDER — TRAMADOL HCL 50 MG PO TABS: 50.0000 mg | ORAL_TABLET | Freq: Four times a day (QID) | ORAL | Status: DC | PRN

## 2020-12-05 MED ORDER — PHENYLEPHRINE 40 MCG/ML (10ML) SYRINGE FOR IV PUSH (FOR BLOOD PRESSURE SUPPORT)
PREFILLED_SYRINGE | INTRAVENOUS | Status: AC
  Filled 2020-12-05: qty 10

## 2020-12-05 MED ORDER — SODIUM CHLORIDE 0.9 % IR SOLN
Status: DC | PRN
  Administered 2020-12-05: 6000 mL

## 2020-12-05 MED ORDER — ORAL CARE MOUTH RINSE
15.0000 mL | Freq: Once | OROMUCOSAL | Status: AC
  Administered 2020-12-05: 15 mL via OROMUCOSAL

## 2020-12-05 MED ORDER — CHLORHEXIDINE GLUCONATE 0.12 % MT SOLN: 15.0000 mL | Freq: Once | OROMUCOSAL | Status: AC

## 2020-12-05 MED ORDER — 0.9 % SODIUM CHLORIDE (POUR BTL) OPTIME
TOPICAL | Status: DC | PRN
  Administered 2020-12-05: 1000 mL

## 2020-12-05 MED ORDER — FENTANYL CITRATE (PF) 100 MCG/2ML IJ SOLN
INTRAMUSCULAR | Status: DC | PRN
  Administered 2020-12-05 (×2): 25 ug via INTRAVENOUS
  Administered 2020-12-05: 50 ug via INTRAVENOUS
  Administered 2020-12-05 (×2): 25 ug via INTRAVENOUS

## 2020-12-05 MED ORDER — PROPOFOL 10 MG/ML IV BOLUS
INTRAVENOUS | Status: DC | PRN
  Administered 2020-12-05: 150 mg via INTRAVENOUS

## 2020-12-05 MED ORDER — LACTATED RINGERS IV SOLN: INTRAVENOUS | Status: DC

## 2020-12-05 MED ORDER — DEXAMETHASONE SODIUM PHOSPHATE 4 MG/ML IJ SOLN
INTRAMUSCULAR | Status: DC | PRN
  Administered 2020-12-05: 8 mg via INTRAVENOUS

## 2020-12-05 SURGICAL SUPPLY — 22 items
BAG URO CATCHER STRL LF (MISCELLANEOUS) ×2 IMPLANT
BASKET STONE NCOMPASS (UROLOGICAL SUPPLIES) IMPLANT
CATH URET 5FR 28IN OPEN ENDED (CATHETERS) ×1 IMPLANT
CATH URET DUAL LUMEN 6-10FR 50 (CATHETERS) IMPLANT
CLOTH BEACON ORANGE TIMEOUT ST (SAFETY) ×2 IMPLANT
EXTRACTOR STONE NITINOL NGAGE (UROLOGICAL SUPPLIES) ×1 IMPLANT
GLOVE SURG POLYISO LF SZ8 (GLOVE) ×2 IMPLANT
GOWN STRL REUS W/TWL XL LVL3 (GOWN DISPOSABLE) ×3 IMPLANT
GUIDEWIRE STR DUAL SENSOR (WIRE) ×2 IMPLANT
IV NS IRRIG 3000ML ARTHROMATIC (IV SOLUTION) ×2 IMPLANT
KIT TURNOVER KIT A (KITS) ×2 IMPLANT
LASER FIB FLEXIVA PULSE ID 365 (Laser) IMPLANT
LASER FIB FLEXIVA PULSE ID 550 (Laser) IMPLANT
LASER FIB FLEXIVA PULSE ID 910 (Laser) IMPLANT
MANIFOLD NEPTUNE II (INSTRUMENTS) ×2 IMPLANT
PACK CYSTO (CUSTOM PROCEDURE TRAY) ×2 IMPLANT
SHEATH URETERAL 12FRX35CM (MISCELLANEOUS) ×1 IMPLANT
STENT URET 6FRX24 CONTOUR (STENTS) ×2 IMPLANT
TRACTIP FLEXIVA PULS ID 200XHI (Laser) IMPLANT
TRACTIP FLEXIVA PULSE ID 200 (Laser) ×2
TUBING CONNECTING 10 (TUBING) ×2 IMPLANT
TUBING UROLOGY SET (TUBING) ×2 IMPLANT

## 2020-12-05 NOTE — Anesthesia Postprocedure Evaluation (Signed)
Anesthesia Post Note  Patient: Jeffrey Holmes  Procedure(s) Performed: CYSTOSCOPY BILATERAL /URETEROSCOPY/HOLMIUM LASER/STENT PLACEMENT (Bilateral )     Patient location during evaluation: PACU Anesthesia Type: General Level of consciousness: sedated and patient cooperative Pain management: pain level controlled Vital Signs Assessment: post-procedure vital signs reviewed and stable Respiratory status: spontaneous breathing Cardiovascular status: stable Anesthetic complications: no   No complications documented.  Last Vitals:  Vitals:   12/05/20 1415 12/05/20 1447  BP: (!) 167/97 (!) 183/94  Pulse: 72 60  Resp: 17 16  Temp: 36.6 C 36.6 C  SpO2: 92% 96%    Last Pain:  Vitals:   12/05/20 1447  TempSrc: Oral  PainSc:                  Nolon Nations

## 2020-12-05 NOTE — Interval H&P Note (Signed)
History and Physical Interval Note:  He has a 46mm left ureteral stone and a possible left ureteral stone with renal stones.   12/05/2020 10:32 AM  Jeffrey Holmes  has presented today for surgery, with the diagnosis of bilateral ureteral stones.  The various methods of treatment have been discussed with the patient and family. After consideration of risks, benefits and other options for treatment, the patient has consented to  Procedure(s): CYSTOSCOPY BILATERAL /URETEROSCOPY/HOLMIUM LASER/STENT PLACEMENT (Bilateral) as a surgical intervention.  The patient's history has been reviewed, patient examined, no change in status, stable for surgery.  I have reviewed the patient's chart and labs.  Questions were answered to the patient's satisfaction.     [name]

## 2020-12-05 NOTE — Anesthesia Procedure Notes (Signed)
Procedure Name: LMA Insertion Date/Time: 12/05/2020 12:24 PM Performed by: Lavina Hamman, CRNA Pre-anesthesia Checklist: Patient identified, Emergency Drugs available, Suction available and Patient being monitored Patient Re-evaluated:Patient Re-evaluated prior to induction Oxygen Delivery Method: Circle System Utilized Preoxygenation: Pre-oxygenation with 100% oxygen Induction Type: IV induction Ventilation: Mask ventilation without difficulty LMA: LMA inserted LMA Size: 4.0 Number of attempts: 1 Airway Equipment and Method: Bite block Placement Confirmation: positive ETCO2 Tube secured with: Tape Dental Injury: Teeth and Oropharynx as per pre-operative assessment

## 2020-12-05 NOTE — Transfer of Care (Signed)
Immediate Anesthesia Transfer of Care Note  Patient: Jeffrey Holmes  Procedure(s) Performed: Procedure(s): CYSTOSCOPY BILATERAL /URETEROSCOPY/HOLMIUM LASER/STENT PLACEMENT (Bilateral)  Patient Location: PACU  Anesthesia Type:General  Level of Consciousness:  sedated, patient cooperative and responds to stimulation  Airway & Oxygen Therapy:Patient Spontanous Breathing and Patient connected to face mask oxgen  Post-op Assessment:  Report given to PACU RN and Post -op Vital signs reviewed and stable  Post vital signs:  Reviewed and stable  Last Vitals:  Vitals:   12/05/20 0913 12/05/20 1335  BP: (!) 164/85 (!) 141/84  Pulse: 63 73  Resp: 16 11  Temp: 36.7 C (P) 37 C  SpO2: 25% 05%    Complications: No apparent anesthesia complications

## 2020-12-05 NOTE — Discharge Instructions (Addendum)
Ureteral Stent Implantation, Care After This sheet gives you information about how to care for yourself after your procedure. Your health care provider may also give you more specific instructions. If you have problems or questions, contact your health care provider. What can I expect after the procedure? After the procedure, it is common to have:  Nausea.  Mild pain when you urinate. You may feel this pain in your lower back or lower abdomen. The pain should stop within a few minutes after you urinate. This may last for up to 1 week.  A small amount of blood in your urine for several days. Follow these instructions at home: Medicines  Take over-the-counter and prescription medicines only as told by your health care provider.  If you were prescribed an antibiotic medicine, take it as told by your health care provider. Do not stop taking the antibiotic even if you start to feel better.  Do not drive for 24 hours if you were given a sedative during your procedure.  Ask your health care provider if the medicine prescribed to you requires you to avoid driving or using heavy machinery. Activity  Rest as told by your health care provider.  Avoid sitting for a long time without moving. Get up to take short walks every 1-2 hours. This is important to improve blood flow and breathing. Ask for help if you feel weak or unsteady.  Return to your normal activities as told by your health care provider. Ask your health care provider what activities are safe for you. General instructions  Watch for any blood in your urine. Call your health care provider if the amount of blood in your urine increases.  If you have a catheter: ? Follow instructions from your health care provider about taking care of your catheter and collection bag. ? Do not take baths, swim, or use a hot tub until your health care provider approves. Ask your health care provider if you may take showers. You may only be allowed to  take sponge baths.  Drink enough fluid to keep your urine pale yellow.  Do not use any products that contain nicotine or tobacco, such as cigarettes, e-cigarettes, and chewing tobacco. These can delay healing after surgery. If you need help quitting, ask your health care provider.  Keep all follow-up visits as told by your health care provider. This is important.   Contact a health care provider if:  You have pain that gets worse or does not get better with medicine, especially pain when you urinate.  You have difficulty urinating.  You feel nauseous or you vomit repeatedly during a period of more than 2 days after the procedure. Get help right away if:  Your urine is dark red or has blood clots in it.  You are leaking urine (have incontinence).  The end of the stent comes out of your urethra.  You cannot urinate.  You have sudden, sharp, or severe pain in your abdomen or lower back.  You have a fever.  You have swelling or pain in your legs.  You have difficulty breathing. Summary  After the procedure, it is common to have mild pain when you urinate that goes away within a few minutes after you urinate. This may last for up to 1 week.  Watch for any blood in your urine. Call your health care provider if the amount of blood in your urine increases.  Take over-the-counter and prescription medicines only as told by your health care provider.  Drink enough fluid to keep your urine pale yellow. This information is not intended to replace advice given to you by your health care provider. Make sure you discuss any questions you have with your health care provider.  You may removed the stents on Friday morning by pulling the attached strings.  If you don't feel you can do that, please call the office to come and have them removed.    Document Revised: 04/21/2018 Document Reviewed: 04/22/2018 Elsevier Patient Education  2021 [employer].

## 2020-12-06 ENCOUNTER — Encounter (HOSPITAL_COMMUNITY): Payer: Self-pay | Admitting: Urology

## 2020-12-06 NOTE — Op Note (Signed)
Procedure: 1.  Cystoscopy with right ureteroscopy with holmium laser application, stone extraction and stent exchange. 2.  Cystoscopy with left ureteroscopy with holmium laser application, stone extraction and stent exchange. 3.  Application of fluoroscopy.  Preop diagnosis: 9 mm right renal stone and 5 mm left mid ureteral stone with small intrarenal stones.  Postop diagnosis: Same.  Surgeon: Dr. [name].  Anesthesia: General.  Drains: Bilateral 6 French by 24 cm contour double-J stent.  Specimen: Stone fragments.  EBL: None.  Complications: None.  Indications: Jeffrey Holmes is a 70 year old male with a history of recurrent uric acid urolithiasis who recently presented with bilateral flank pain and acute kidney injury and was found to have bilateral obstructing stones with a 9 mm right UPJ stone and a 5 mm left mid ureteral stone with additional renal stones and is atrophied left kidney.  He had undergone a prior stent placement bilaterally with recovery of his renal function to a creatinine of 1.99.  Procedure: He was given 2 g of Ancef.  He was taken operating room and general anesthetic was induced.  He was placed in lithotomy position and fitted with PAS hose.  He was prepped with Betadine solution and draped in the usual sterile fashion.  Cystoscopy was performed using a 21 [place] scope and 30 degree lens.  Examination revealed a normal urethra.  The external sphincter was intact.  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  Examination of bladder revealed stents of both ureteral orifices.  There were some irritation from the stents.  No other mucosal lesions were identified.  The right stent was grasped with a grasping forceps and pulled the urethral meatus.  Unfortunately came out beyond the meatus so the cystoscope was reinserted and a sensor wire was advanced directly to the kidney.  The cystoscope was removed and the 12/14 French 35 cm digital access  sheath was advanced the kidney over the wire without difficulty.  The wire was removed along with the inner core.  Dual-lumen digital flexible scope was then passed to the kidney and a 9 mm stone was identified in the mid upper Pole calyx.  The stone was then fragmented using the 200 m laser fiber set on 0.5 J and 20 Hz.  Once adequately fragmented, the stone fragments were removed using an engage basket leaving only grit and sand.  The guidewire was then replaced to the kidney through the access sheath and the access sheath was removed.  A fresh 6 French by 24 cm contour double-J stent was advanced the kidney under fluoroscopic guidance.  The wire was removed, leaving a good coil in the kidney and a good coil in the bladder.  The stent string was left exiting the urethra.  The cystoscope was then reinserted and the left stent was then grasped and pulled the urethral meatus.  A sensor wire was passed to the kidney and the stent was removed.  The single-lumen semirigid short ureteroscope was then advanced per urethra alongside the wire into the ureter.  The 5 mm stone was noted in the upper mid ureter.  It was fragmented with the 200 m laser with the same settings as before.  The fragments were removed with the engage basket and placed in the bladder.  Ureteroscope was removed and the access sheath was reinserted over the wire to the proximal ureter.  The wire and inner core were removed and the dual-lumen scope was advanced into the kidney.  There were a few  small adherent stones in the upper pole and lower pole which were fragmented with the laser but no large stones were identified and no free-floating stones were identified.  None of the fragments were large enough to require retrieval.  After removal of the ureteroscope the wire was reinserted to the kidney and the access sheath was removed.  A 6 French by 24 cm contour left ureteral stent with tether was advanced to the kidney under fluoroscopic  guidance.  The wire was removed leaving good coil in the kidney and a good coil in the bladder.  The stent string was left exiting urethra.  The cystoscope was then reinserted and the stone fragments were removed.  The cystoscope was then removed.  The stent strings were secured to the patient's penis.  He was taken down from lithotomy position, his anesthetic was reversed and he was moved recovery in stable condition.  There were no complications.  The stone fragments were given to his wife.

## 2020-12-07 DIAGNOSIS — H401112 Primary open-angle glaucoma, right eye, moderate stage: Secondary | ICD-10-CM | POA: Diagnosis not present

## 2020-12-07 DIAGNOSIS — H2511 Age-related nuclear cataract, right eye: Secondary | ICD-10-CM | POA: Diagnosis not present

## 2020-12-07 DIAGNOSIS — H401111 Primary open-angle glaucoma, right eye, mild stage: Secondary | ICD-10-CM | POA: Diagnosis not present

## 2020-12-12 DIAGNOSIS — N201 Calculus of ureter: Secondary | ICD-10-CM | POA: Diagnosis not present

## 2020-12-12 DIAGNOSIS — N270 Small kidney, unilateral: Secondary | ICD-10-CM | POA: Diagnosis not present

## 2020-12-12 DIAGNOSIS — N202 Calculus of kidney with calculus of ureter: Secondary | ICD-10-CM | POA: Diagnosis not present

## 2020-12-13 ENCOUNTER — Inpatient Hospital Stay (HOSPITAL_COMMUNITY): Payer: Medicare Other | Admitting: Anesthesiology

## 2020-12-13 ENCOUNTER — Other Ambulatory Visit: Payer: Self-pay | Admitting: Urology

## 2020-12-13 ENCOUNTER — Observation Stay (HOSPITAL_COMMUNITY)
Admission: AD | Admit: 2020-12-13 | Discharge: 2020-12-14 | Disposition: A | Discharge status: Home / Self Care | Payer: Medicare Other | Source: Ambulatory Visit | Attending: Urology | Admitting: Urology

## 2020-12-13 ENCOUNTER — Encounter (HOSPITAL_COMMUNITY)
Admission: AD | Disposition: A | Discharge status: Home / Self Care | Payer: Self-pay | Source: Ambulatory Visit | Attending: Urology

## 2020-12-13 ENCOUNTER — Inpatient Hospital Stay (HOSPITAL_COMMUNITY): Payer: Medicare Other

## 2020-12-13 ENCOUNTER — Encounter (HOSPITAL_COMMUNITY): Payer: Self-pay | Admitting: Urology

## 2020-12-13 ENCOUNTER — Other Ambulatory Visit: Payer: Self-pay

## 2020-12-13 DIAGNOSIS — E785 Hyperlipidemia, unspecified: Secondary | ICD-10-CM | POA: Diagnosis not present

## 2020-12-13 DIAGNOSIS — N179 Acute kidney failure, unspecified: Secondary | ICD-10-CM | POA: Insufficient documentation

## 2020-12-13 DIAGNOSIS — N200 Calculus of kidney: Secondary | ICD-10-CM | POA: Diagnosis not present

## 2020-12-13 DIAGNOSIS — N183 Chronic kidney disease, stage 3 unspecified: Secondary | ICD-10-CM | POA: Insufficient documentation

## 2020-12-13 DIAGNOSIS — N1330 Unspecified hydronephrosis: Secondary | ICD-10-CM | POA: Diagnosis not present

## 2020-12-13 DIAGNOSIS — E1122 Type 2 diabetes mellitus with diabetic chronic kidney disease: Secondary | ICD-10-CM | POA: Insufficient documentation

## 2020-12-13 DIAGNOSIS — N201 Calculus of ureter: Secondary | ICD-10-CM | POA: Diagnosis not present

## 2020-12-13 DIAGNOSIS — N132 Hydronephrosis with renal and ureteral calculous obstruction: Secondary | ICD-10-CM | POA: Diagnosis not present

## 2020-12-13 DIAGNOSIS — Z20822 Contact with and (suspected) exposure to covid-19: Secondary | ICD-10-CM | POA: Insufficient documentation

## 2020-12-13 DIAGNOSIS — I2510 Atherosclerotic heart disease of native coronary artery without angina pectoris: Secondary | ICD-10-CM | POA: Diagnosis not present

## 2020-12-13 DIAGNOSIS — N202 Calculus of kidney with calculus of ureter: Secondary | ICD-10-CM | POA: Diagnosis not present

## 2020-12-13 DIAGNOSIS — Z951 Presence of aortocoronary bypass graft: Secondary | ICD-10-CM | POA: Diagnosis not present

## 2020-12-13 DIAGNOSIS — I129 Hypertensive chronic kidney disease with stage 1 through stage 4 chronic kidney disease, or unspecified chronic kidney disease: Secondary | ICD-10-CM | POA: Insufficient documentation

## 2020-12-13 DIAGNOSIS — E1165 Type 2 diabetes mellitus with hyperglycemia: Secondary | ICD-10-CM | POA: Diagnosis not present

## 2020-12-13 DIAGNOSIS — N170 Acute kidney failure with tubular necrosis: Secondary | ICD-10-CM | POA: Diagnosis not present

## 2020-12-13 DIAGNOSIS — N178 Other acute kidney failure: Secondary | ICD-10-CM | POA: Diagnosis not present

## 2020-12-13 HISTORY — PX: CYSTOSCOPY W/ URETERAL STENT PLACEMENT: SHX1429

## 2020-12-13 HISTORY — PX: URETEROSCOPY WITH HOLMIUM LASER LITHOTRIPSY: SHX6645

## 2020-12-13 LAB — BASIC METABOLIC PANEL
Anion gap: 13 (ref 5–15)
BUN: 73 mg/dL — ABNORMAL HIGH (ref 8–23)
CO2: 19 mmol/L — ABNORMAL LOW (ref 22–32)
Calcium: 8.8 mg/dL — ABNORMAL LOW (ref 8.9–10.3)
Chloride: 109 mmol/L (ref 98–111)
Creatinine, Ser: 9.84 mg/dL — ABNORMAL HIGH (ref 0.61–1.24)
GFR, Estimated: 5 mL/min — ABNORMAL LOW (ref >60)
Glucose, Bld: 104 mg/dL — ABNORMAL HIGH (ref 70–99)
Potassium: 4 mmol/L (ref 3.5–5.1)
Sodium: 141 mmol/L (ref 135–145)

## 2020-12-13 LAB — SARS CORONAVIRUS 2 BY RT PCR (HOSPITAL ORDER, PERFORMED IN ~~LOC~~ HOSPITAL LAB): SARS Coronavirus 2: NEGATIVE

## 2020-12-13 LAB — CBC
HCT: 44.6 % (ref 39.0–52.0)
Hemoglobin: 14.9 g/dL (ref 13.0–17.0)
MCH: 30.9 pg (ref 26.0–34.0)
MCHC: 33.4 g/dL (ref 30.0–36.0)
MCV: 92.5 fL (ref 80.0–100.0)
Platelets: 229 10*3/uL (ref 150–400)
RBC: 4.82 MIL/uL (ref 4.22–5.81)
RDW: 13.1 % (ref 11.5–15.5)
WBC: 12 10*3/uL — ABNORMAL HIGH (ref 4.0–10.5)
nRBC: 0 % (ref 0.0–0.2)

## 2020-12-13 LAB — GLUCOSE, CAPILLARY
Glucose-Capillary: 100 mg/dL — ABNORMAL HIGH (ref 70–99)
Glucose-Capillary: 102 mg/dL — ABNORMAL HIGH (ref 70–99)
Glucose-Capillary: 280 mg/dL — ABNORMAL HIGH (ref 70–99)

## 2020-12-13 SURGERY — CYSTOSCOPY, WITH RETROGRADE PYELOGRAM AND URETERAL STENT INSERTION
Anesthesia: General | Site: Ureter | Laterality: Right

## 2020-12-13 MED ORDER — PHENYLEPHRINE HCL-NACL 10-0.9 MG/250ML-% IV SOLN
INTRAVENOUS | Status: DC | PRN
  Administered 2020-12-13: 25 ug/min via INTRAVENOUS

## 2020-12-13 MED ORDER — SENNA 8.6 MG PO TABS
1.0000 | ORAL_TABLET | Freq: Two times a day (BID) | ORAL | Status: DC
  Filled 2020-12-13 (×3): qty 1

## 2020-12-13 MED ORDER — SODIUM CHLORIDE 0.9 % IR SOLN
Status: DC | PRN
  Administered 2020-12-13: 3000 mL

## 2020-12-13 MED ORDER — ONDANSETRON HCL 4 MG/2ML IJ SOLN
INTRAMUSCULAR | Status: DC | PRN
  Administered 2020-12-13: 4 mg via INTRAVENOUS

## 2020-12-13 MED ORDER — FENTANYL CITRATE (PF) 250 MCG/5ML IJ SOLN
INTRAMUSCULAR | Status: DC | PRN
  Administered 2020-12-13: 50 ug via INTRAVENOUS

## 2020-12-13 MED ORDER — IOHEXOL 300 MG/ML  SOLN
INTRAMUSCULAR | Status: DC | PRN
  Administered 2020-12-13: 10 mL via URETHRAL

## 2020-12-13 MED ORDER — ONDANSETRON HCL 4 MG/2ML IJ SOLN: 4.0000 mg | Freq: Four times a day (QID) | INTRAMUSCULAR | Status: DC | PRN

## 2020-12-13 MED ORDER — ONDANSETRON HCL 4 MG/2ML IJ SOLN
INTRAMUSCULAR | Status: AC
  Filled 2020-12-13: qty 2

## 2020-12-13 MED ORDER — PHENYLEPHRINE HCL (PRESSORS) 10 MG/ML IV SOLN
INTRAVENOUS | Status: AC
  Filled 2020-12-13: qty 1

## 2020-12-13 MED ORDER — DEXAMETHASONE SODIUM PHOSPHATE 10 MG/ML IJ SOLN
INTRAMUSCULAR | Status: AC
  Filled 2020-12-13: qty 1

## 2020-12-13 MED ORDER — FENTANYL CITRATE (PF) 100 MCG/2ML IJ SOLN: 25.0000 ug | INTRAMUSCULAR | Status: DC | PRN

## 2020-12-13 MED ORDER — DEXAMETHASONE SODIUM PHOSPHATE 10 MG/ML IJ SOLN
INTRAMUSCULAR | Status: DC | PRN
  Administered 2020-12-13: 10 mg via INTRAVENOUS

## 2020-12-13 MED ORDER — PROPOFOL 10 MG/ML IV BOLUS
INTRAVENOUS | Status: AC
  Filled 2020-12-13: qty 20

## 2020-12-13 MED ORDER — CEFAZOLIN SODIUM-DEXTROSE 2-4 GM/100ML-% IV SOLN
2.0000 g | Freq: Once | INTRAVENOUS | Status: AC
  Administered 2020-12-13: 2 g via INTRAVENOUS
  Filled 2020-12-13: qty 100

## 2020-12-13 MED ORDER — MONTELUKAST SODIUM 10 MG PO TABS
10.0000 mg | ORAL_TABLET | Freq: Every day | ORAL | Status: DC
  Administered 2020-12-13: 10 mg via ORAL
  Filled 2020-12-13 (×2): qty 1

## 2020-12-13 MED ORDER — NITROGLYCERIN 0.4 MG SL SUBL: 0.4000 mg | SUBLINGUAL_TABLET | SUBLINGUAL | Status: DC | PRN

## 2020-12-13 MED ORDER — LACTATED RINGERS IV SOLN: INTRAVENOUS | Status: DC

## 2020-12-13 MED ORDER — LORATADINE 10 MG PO TABS
10.0000 mg | ORAL_TABLET | Freq: Every day | ORAL | Status: DC | PRN
  Filled 2020-12-13: qty 1

## 2020-12-13 MED ORDER — FENTANYL CITRATE (PF) 100 MCG/2ML IJ SOLN
INTRAMUSCULAR | Status: AC
  Filled 2020-12-13: qty 2

## 2020-12-13 MED ORDER — EPHEDRINE SULFATE 50 MG/ML IJ SOLN
INTRAMUSCULAR | Status: DC | PRN
  Administered 2020-12-13: 10 mg via INTRAVENOUS

## 2020-12-13 MED ORDER — CLOPIDOGREL BISULFATE 75 MG PO TABS
75.0000 mg | ORAL_TABLET | Freq: Every day | ORAL | Status: DC
  Administered 2020-12-14: 75 mg via ORAL
  Filled 2020-12-13 (×2): qty 1

## 2020-12-13 MED ORDER — PROPOFOL 10 MG/ML IV BOLUS
INTRAVENOUS | Status: DC | PRN
  Administered 2020-12-13: 150 mg via INTRAVENOUS
  Administered 2020-12-13: 30 mg via INTRAVENOUS

## 2020-12-13 MED ORDER — LIDOCAINE 2% (20 MG/ML) 5 ML SYRINGE
INTRAMUSCULAR | Status: AC
  Filled 2020-12-13: qty 5

## 2020-12-13 MED ORDER — SODIUM CHLORIDE 0.45 % IV SOLN: INTRAVENOUS | Status: DC

## 2020-12-13 MED ORDER — METOPROLOL SUCCINATE ER 50 MG PO TB24
50.0000 mg | ORAL_TABLET | Freq: Every day | ORAL | Status: DC
  Administered 2020-12-14: 50 mg via ORAL
  Filled 2020-12-13 (×2): qty 1

## 2020-12-13 MED ORDER — INSULIN ASPART 100 UNIT/ML IJ SOLN
0.0000 [IU] | Freq: Every day | INTRAMUSCULAR | Status: DC
  Administered 2020-12-13: 3 [IU] via SUBCUTANEOUS

## 2020-12-13 MED ORDER — TIMOLOL MALEATE 0.5 % OP SOLN
1.0000 [drp] | Freq: Two times a day (BID) | OPHTHALMIC | Status: DC
  Administered 2020-12-14: 1 [drp] via OPHTHALMIC
  Filled 2020-12-13: qty 5

## 2020-12-13 MED ORDER — ATORVASTATIN CALCIUM 40 MG PO TABS
80.0000 mg | ORAL_TABLET | Freq: Every day | ORAL | Status: DC
  Administered 2020-12-14: 80 mg via ORAL
  Filled 2020-12-13: qty 1
  Filled 2020-12-13: qty 2

## 2020-12-13 MED ORDER — SODIUM CHLORIDE 0.9 % IV SOLN: INTRAVENOUS | Status: DC | PRN

## 2020-12-13 MED ORDER — LISINOPRIL 20 MG PO TABS
20.0000 mg | ORAL_TABLET | Freq: Every day | ORAL | Status: DC
  Administered 2020-12-14: 20 mg via ORAL
  Filled 2020-12-13 (×2): qty 1

## 2020-12-13 MED ORDER — OXYBUTYNIN CHLORIDE 5 MG PO TABS: 5.0000 mg | ORAL_TABLET | Freq: Three times a day (TID) | ORAL | Status: DC | PRN

## 2020-12-13 MED ORDER — CHLORHEXIDINE GLUCONATE 0.12 % MT SOLN
15.0000 mL | OROMUCOSAL | Status: AC
  Administered 2020-12-13: 15 mL via OROMUCOSAL

## 2020-12-13 MED ORDER — ACETAMINOPHEN 325 MG PO TABS: 650.0000 mg | ORAL_TABLET | ORAL | Status: DC | PRN

## 2020-12-13 MED ORDER — TRAMADOL HCL 50 MG PO TABS: 50.0000 mg | ORAL_TABLET | Freq: Four times a day (QID) | ORAL | Status: DC | PRN

## 2020-12-13 MED ORDER — EPHEDRINE 5 MG/ML INJ
INTRAVENOUS | Status: AC
  Filled 2020-12-13: qty 10

## 2020-12-13 MED ORDER — LATANOPROST 0.005 % OP SOLN
1.0000 [drp] | Freq: Every day | OPHTHALMIC | Status: DC
  Administered 2020-12-13: 1 [drp] via OPHTHALMIC
  Filled 2020-12-13: qty 2.5

## 2020-12-13 MED ORDER — INSULIN ASPART 100 UNIT/ML IJ SOLN
0.0000 [IU] | Freq: Three times a day (TID) | INTRAMUSCULAR | Status: DC
  Administered 2020-12-14: 2 [IU] via SUBCUTANEOUS
  Administered 2020-12-14: 3 [IU] via SUBCUTANEOUS

## 2020-12-13 MED ORDER — LIDOCAINE 2% (20 MG/ML) 5 ML SYRINGE
INTRAMUSCULAR | Status: DC | PRN
  Administered 2020-12-13 (×2): 25 mg via INTRAVENOUS
  Administered 2020-12-13: 50 mg via INTRAVENOUS
  Administered 2020-12-13: 25 mg via INTRAVENOUS

## 2020-12-13 SURGICAL SUPPLY — 14 items
BAG URO CATCHER STRL LF (MISCELLANEOUS) ×2 IMPLANT
CATH INTERMIT  6FR 70CM (CATHETERS) IMPLANT
CLOTH BEACON ORANGE TIMEOUT ST (SAFETY) ×2 IMPLANT
FIBER LASER MOSES 365 DFL (Laser) IMPLANT
GLOVE BIOGEL M 8.0 STRL (GLOVE) ×2 IMPLANT
GOWN STRL REUS W/TWL XL LVL3 (GOWN DISPOSABLE) ×2 IMPLANT
GUIDEWIRE ANG ZIPWIRE 038X150 (WIRE) IMPLANT
GUIDEWIRE STR DUAL SENSOR (WIRE) ×2 IMPLANT
KIT TURNOVER KIT A (KITS) ×2 IMPLANT
LASER FIB FLEXIVA PULSE ID 365 (Laser) ×1 IMPLANT
MANIFOLD NEPTUNE II (INSTRUMENTS) ×2 IMPLANT
PACK CYSTO (CUSTOM PROCEDURE TRAY) ×2 IMPLANT
STENT URET 6FRX24 CONTOUR (STENTS) ×1 IMPLANT
TUBING CONNECTING 10 (TUBING) ×2 IMPLANT

## 2020-12-13 NOTE — Op Note (Signed)
Preoperative diagnosis: Right distal ureteral stone with hydronephrosis and acute on chronic renal failure  Postoperative diagnosis: Same  Principal procedure: Cystoscopy, right retrograde ureteropyelogram, fluoroscopic interpretation, right ureteroscopy, holmium laser ablation of stone, placement of 6 French by 24 cm contour double-J stent with tether  Surgeon: Samentha Perham  Anesthesia: General with LMA  Complications: None  Specimen: None  Estimated blood loss: None  Drains: 6 French by 24 cm contour double-J stent with tether  Indication: 70 year old male status post recent bilateral ureteroscopic stone management.  Stents were recently removed, he came in yesterday for routine follow-up.  The patient was found to have a creatinine of 8 and he represented today for CT scan which revealed a moderate size right distal ureteral stone with hydronephrosis.  He presents at this time for urgent management of the stone and stent placement.  I discussed the procedure with the patient.  Risks and complications were discussed.  He understands these and desires to proceed.  Findings: Urethra was normal, prostate minimally obstructive.  Bladder was inspected circumferentially.  Mild erythema and edema around both ureteral orifice ease which were normal in location and configuration.  No other urothelial lesions noted.  Retrograde study of the right ureter revealed a normal distal ureter for approximately 4 cm but there was a filling defect which was totally obstructing contrast getting by.  This filling defect was consistent with his known stone.  Description of procedure: The patient was properly identified and marked in the holding area.  He received preoperative IV antibiotics if he was taken to the operating room where general anesthetic was administered with the LMA.  He was placed in the dorsolithotomy position.  Genitalia and perineum were prepped, draped, proper timeout performed.  21 [place]  panendoscope advanced under direct vision.  The above-mentioned findings were noted upon systematic examination of the bladder.  Retrograde study was performed with the above-mentioned findings noted.  Using the 6 [place] open-ended catheter following retrograde study, a sensor tip guidewire was advanced through the catheter, past the stone and into the upper pole calyceal system where a curl was seen fluoroscopically.  The cystoscope and open-ended catheter were removed.  The guidewire was left in place.  I then negotiated the 6 French dual-lumen semirigid ureteroscope into the bladder, and up into the ureter where the stone was encountered.  There was mild stricture of the ureter in this area.  The stone filled the ureter.  I then used a 365 m fiber to apply laser energy- 0.2 J, 70 Hz.  This turned the stone into dust like fragments.  Most of these were actually rinsed out into the bladder as this was a distal stone.  Very few fragments were remaining in the ureter at this point, all of which were quite small and I would easily pass.  Following this, the ureteroscope was removed after it was advanced up the ureter and no further ureteral stones were seen.  The guidewire was left in place.  It was then backloaded through the cystoscope and a 24 cm contour double-J stent, 6 [place] in size was advanced.  Tether was left on.  Once adequately positioned, the guidewire was removed and the stent deployed.  Excellent proximal distal curls were seen using fluoroscopic and cystoscopic guidance, respectively.  At this point, the cystoscope was used to drain the multiple fragments from the bladder.  These were not saved.  The bladder was drained, the cystoscope removed.  The tether was then tied outside the urethra, trimmed,  and taped to the penis.  At this point, the procedure was terminated.  The patient was awakened, taken to the PACU in stable condition, having tolerated the procedure well.  He will be left in the  hospital overnight for monitoring and for creatinine check in the morning

## 2020-12-13 NOTE — Anesthesia Procedure Notes (Signed)
Procedure Name: LMA Insertion Date/Time: 12/13/2020 7:00 PM Performed by: Lissa Morales, CRNA Pre-anesthesia Checklist: Patient identified, Emergency Drugs available, Suction available and Patient being monitored Patient Re-evaluated:Patient Re-evaluated prior to induction Oxygen Delivery Method: Circle system utilized Preoxygenation: Pre-oxygenation with 100% oxygen Induction Type: IV induction Ventilation: Mask ventilation without difficulty LMA: LMA with gastric port inserted LMA Size: 4.0 Tube type: Oral Number of attempts: 1 Airway Equipment and Method: Stylet and Oral airway Placement Confirmation: positive ETCO2 Tube secured with: Tape Dental Injury: Teeth and Oropharynx as per pre-operative assessment

## 2020-12-13 NOTE — Transfer of Care (Signed)
Immediate Anesthesia Transfer of Care Note  Patient: Jeffrey Holmes  Procedure(s) Performed: CYSTOSCOPY WITH RETROGRADE PYELOGRAM/URETERAL STENT PLACEMENT (Right Ureter) URETEROSCOPY WITH HOLMIUM LASER LITHOTRIPSY (Right Ureter)  Patient Location: PACU  Anesthesia Type:General  Level of Consciousness: awake, alert , oriented and patient cooperative  Airway & Oxygen Therapy: Patient Spontanous Breathing and Patient connected to face mask oxygen  Post-op Assessment: Report given to RN, Post -op Vital signs reviewed and stable and Patient moving all extremities X 4  Post vital signs: stable  Last Vitals:  Vitals Value Taken Time  BP 146/81 12/13/20 1945  Temp 37 C 12/13/20 1939  Pulse 78 12/13/20 1950  Resp 19 12/13/20 1950  SpO2 96 % 12/13/20 1950  Vitals shown include unvalidated device data.  Last Pain:  Vitals:   12/13/20 1701  TempSrc: Oral         Complications: No complications documented.

## 2020-12-13 NOTE — H&P (Signed)
H&P  Chief Complaint: Right ureteral stone, hydronephrosis, acute renal insufficiency  History of Present Illness: 70 year old male presents for urgent management of a right ureteral stone with hydronephrosis and creatinine of 8.  He does have underlying chronic renal insufficiency.  He underwent cystoscopy, bilateral ureteroscopic stone management 1 week ago by Dr. Jeffie Pollock.  Stents were placed.  Stents came out a couple of days ago, removed by the patient.  He was followed up yesterday by Jiles Crocker, NP.  Routine labs were drawn including a creatinine which returned just above 8.  CT scan revealed right hydronephrosis and a 4 to 5 mm right distal ureteral stone.  He presents at this time for cystoscopy, retrograde, stone management with stent placement.  Past Medical History:  Diagnosis Date  . Allergy   . Benign essential HTN 08/08/2015  . CAD (coronary artery disease), native coronary artery 08/28/2015   Severe multivessel ASCAD s/p CABG hybrid with LIMA to LAD and diag and PCI of the RCA.   [name] Chronic back pain    L5-S1  . Chronic kidney disease, stage 3 (HCC)    multiple kidney stones  . Diabetes mellitus without complication ([place])    TYPE 2  . GERD (gastroesophageal reflux disease)    OCC  . H/O hiatal hernia   . Hepatic cyst 11/01/2016  . History of acute renal failure 2015   secondary to stones, ELEVATED CREATININE, NO DIALYSIS DONE  . History of kidney stones   . Hx of adenomatous colonic polyps 06/11/2017  . Hyperlipidemia 08/08/2015  . NAFLD (nonalcoholic fatty liver disease) 11/01/2016  . PONV (postoperative nausea and vomiting)    hx of years ago   . Splenic cyst 11/01/2016  . Ureteral calculus 01/06/2014    Past Surgical History:  Procedure Laterality Date  . ANGIOPLASTY N/A 08/21/2015   Procedure: ANGIOPLASTY WITH PERCUTANEOUS CORONARY INTERVENTION WITH DES TO RIGHT CORONARY ARTERY;  Surgeon: Jettie Booze, MD;  Location: [place];  Service: Cardiovascular;  Laterality:  N/A;  . BACK SURGERY  2006   microdisectomy L5-S1  . CARDIAC CATHETERIZATION N/A 08/18/2015   Procedure: Left Heart Cath and Coronary Angiography;  Surgeon: Jettie Booze, MD;  Location: [facility];  Service: Cardiovascular;  Laterality: N/A;  . CARDIAC CATHETERIZATION N/A 08/21/2015   Procedure: Coronary Stent Intervention;  Surgeon: Jettie Booze, MD;  Location: [facility];  Service: Cardiovascular;  Laterality: N/A;  . CARDIAC CATHETERIZATION  08/21/2015   Procedure: Bypass Graft Angiography;  Surgeon: Jettie Booze, MD;  Location: [facility];  Service: Cardiovascular;;  . CARDIOVASCULAR STRESS TEST  08/15/2015  . cataract surgery     . COLONOSCOPY    . CORONARY ARTERY BYPASS GRAFT N/A 08/21/2015   Procedure: OFF PUMP CORONARY ARTERY BYPASS GRAFTING (CABG), TIMES TWO, USING LEFT INTERNAL MAMMARY ARTERY, RIGHT GREATER SAPHENOUS VEIN HARVESTED ENDOSCOPICALLY;  Surgeon: [name], MD;  Location: [facility];  Service: Open Heart Surgery;  Laterality: N/A;  . CYSTOSCOPY W/ URETERAL STENT PLACEMENT Right 03/12/2017   Procedure: CYSTOSCOPY WITH RETROGRADE PYELOGRAM/URETERAL STENT PLACEMENT;  Surgeon: Bjorn Loser, MD;  Location: [facility];  Service: Urology;  Laterality: Right;  . CYSTOSCOPY WITH RETROGRADE PYELOGRAM, URETEROSCOPY AND STENT PLACEMENT Right 01/06/2014   Procedure: CYSTOSCOPY WITH RETROGRADE PYELOGRAM, URETEROSCOPY AND STENT PLACEMENT;  Surgeon: Bernestine Amass, MD;  Location: [facility];  Service: Urology;  Laterality: Right;  . CYSTOSCOPY/URETEROSCOPY/HOLMIUM LASER/STENT PLACEMENT Right 03/25/2017   Procedure: RIGHT URETEROSCOPY WITH HOLMIUM LASER STENT  EXCHANGE;  Surgeon: [name], MD;  Location: [facility];  Service: Urology;  Laterality: Right;  . CYSTOSCOPY/URETEROSCOPY/HOLMIUM LASER/STENT PLACEMENT Bilateral 11/17/2020   Procedure: CYSTOSCOPY/URETEROSCOPY/HOLMIUM LASER/STENT PLACEMENT;  Surgeon: Lucas Mallow, MD;  Location: [facility];   Service: Urology;  Laterality: Bilateral;  . CYSTOSCOPY/URETEROSCOPY/HOLMIUM LASER/STENT PLACEMENT Bilateral 12/05/2020   Procedure: CYSTOSCOPY BILATERAL /URETEROSCOPY/HOLMIUM LASER/STENT PLACEMENT;  Surgeon: [name], MD;  Location: [facility];  Service: Urology;  Laterality: Bilateral;  . ESOPHAGOGASTRODUODENOSCOPY ENDOSCOPY     normal  . LITHOTRIPSY     2-3 times in past  . TEE WITHOUT CARDIOVERSION N/A 08/21/2015   Procedure: TRANSESOPHAGEAL ECHOCARDIOGRAM (TEE);  Surgeon: [name], MD;  Location: [place];  Service: Open Heart Surgery;  Laterality: N/A;  . TONSILLECTOMY    . WISDOM TOOTH EXTRACTION      Home Medications:  Allergies as of 12/13/2020      Reactions   Vicodin [hydrocodone-acetaminophen] Nausea And Vomiting   Hydrocodone Nausea And Vomiting   Oxycontin [oxycodone Hcl] Nausea And Vomiting   Percocet [oxycodone-acetaminophen] Nausea And Vomiting      Medication List    Notice   Cannot display discharge medications because the patient has not yet been admitted.     Allergies:  Allergies  Allergen Reactions  . Vicodin [Hydrocodone-Acetaminophen] Nausea And Vomiting  . Hydrocodone Nausea And Vomiting  . Oxycontin [Oxycodone Hcl] Nausea And Vomiting  . Percocet [Oxycodone-Acetaminophen] Nausea And Vomiting    Family History  Problem Relation Age of Onset  . Kidney failure Mother   . Heart attack Mother        angina   . Diabetes Mother   . Throat cancer Father   . Diabetes Sister   . Colon cancer Neg Hx   . Colon polyps Neg Hx   . Rectal cancer Neg Hx   . Stomach cancer Neg Hx     Social History:  reports that he has never smoked. He has never used smokeless tobacco. He reports that he does not drink alcohol and does not use drugs.  ROS: A complete review of systems was performed.  All systems are negative except for pertinent findings as noted.  Physical Exam:  Vital signs in last 24 hours: There were no vitals taken for this  visit. Constitutional:  Alert and oriented, No acute distress Cardiovascular: Regular rate  Respiratory: Normal respiratory effort GI: Abdomen is soft, nontender, nondistended, no abdominal masses. No CVAT.  Genitourinary: Normal male phallus, testes are descended bilaterally and non-tender and without masses, scrotum is normal in appearance without lesions or masses, perineum is normal on inspection. Lymphatic: No lymphadenopathy Neurologic: Grossly intact, no focal deficits Psychiatric: Normal mood and affect   I reviewed pertinent laboratories and CT images. Impression/Assessment:  Right distal ureteral stone, hydronephrosis, acute on chronic acute renal insufficiency  Plan:  Cystoscopy, right retrograde, right ureteroscopy, laser and extraction of stone with placement of stent.+

## 2020-12-13 NOTE — Anesthesia Preprocedure Evaluation (Signed)
Anesthesia Evaluation  Patient identified by MRN, date of birth, ID band Patient awake    Reviewed: Allergy & Precautions, H&P , NPO status , Patient's Chart, lab work & pertinent test results  History of Anesthesia Complications (+) PONV and history of anesthetic complications  Airway Mallampati: II   Neck ROM: full    Dental   Pulmonary neg pulmonary ROS,    breath sounds clear to auscultation       Cardiovascular hypertension, + CAD and + CABG   Rhythm:regular Rate:Normal     Neuro/Psych    GI/Hepatic hiatal hernia, GERD  ,  Endo/Other  diabetes, Type 2  Renal/GU Renal InsufficiencyRenal disease     Musculoskeletal   Abdominal   Peds  Hematology   Anesthesia Other Findings   Reproductive/Obstetrics                             Anesthesia Physical Anesthesia Plan  ASA: III  Anesthesia Plan: General   Post-op Pain Management:    Induction: Intravenous  PONV Risk Score and Plan: 3 and Ondansetron, Dexamethasone, Midazolam and Treatment may vary due to age or medical condition  Airway Management Planned: LMA  Additional Equipment:   Intra-op Plan:   Post-operative Plan: Extubation in OR  Informed Consent: I have reviewed the patients History and Physical, chart, labs and discussed the procedure including the risks, benefits and alternatives for the proposed anesthesia with the patient or authorized representative who has indicated his/her understanding and acceptance.     Dental advisory given  Plan Discussed with: CRNA, Anesthesiologist and Surgeon  Anesthesia Plan Comments:         Anesthesia Quick Evaluation

## 2020-12-14 ENCOUNTER — Encounter (HOSPITAL_COMMUNITY): Payer: Self-pay | Admitting: Urology

## 2020-12-14 DIAGNOSIS — N132 Hydronephrosis with renal and ureteral calculous obstruction: Secondary | ICD-10-CM | POA: Diagnosis not present

## 2020-12-14 DIAGNOSIS — I129 Hypertensive chronic kidney disease with stage 1 through stage 4 chronic kidney disease, or unspecified chronic kidney disease: Secondary | ICD-10-CM | POA: Diagnosis not present

## 2020-12-14 DIAGNOSIS — I2510 Atherosclerotic heart disease of native coronary artery without angina pectoris: Secondary | ICD-10-CM | POA: Diagnosis not present

## 2020-12-14 DIAGNOSIS — N179 Acute kidney failure, unspecified: Secondary | ICD-10-CM | POA: Diagnosis not present

## 2020-12-14 DIAGNOSIS — N183 Chronic kidney disease, stage 3 unspecified: Secondary | ICD-10-CM | POA: Diagnosis not present

## 2020-12-14 DIAGNOSIS — Z20822 Contact with and (suspected) exposure to covid-19: Secondary | ICD-10-CM | POA: Diagnosis not present

## 2020-12-14 LAB — BASIC METABOLIC PANEL
Anion gap: 10 (ref 5–15)
Anion gap: 15 (ref 5–15)
BUN: 69 mg/dL — ABNORMAL HIGH (ref 8–23)
BUN: 69 mg/dL — ABNORMAL HIGH (ref 8–23)
CO2: 19 mmol/L — ABNORMAL LOW (ref 22–32)
CO2: 20 mmol/L — ABNORMAL LOW (ref 22–32)
Calcium: 8.7 mg/dL — ABNORMAL LOW (ref 8.9–10.3)
Calcium: 8.4 mg/dL — ABNORMAL LOW (ref 8.9–10.3)
Chloride: 110 mmol/L (ref 98–111)
Chloride: 106 mmol/L (ref 98–111)
Creatinine, Ser: 8.44 mg/dL — ABNORMAL HIGH (ref 0.61–1.24)
Creatinine, Ser: 7.49 mg/dL — ABNORMAL HIGH (ref 0.61–1.24)
GFR, Estimated: 6 mL/min — ABNORMAL LOW (ref >60)
GFR, Estimated: 7 mL/min — ABNORMAL LOW (ref >60)
Glucose, Bld: 218 mg/dL — ABNORMAL HIGH (ref 70–99)
Glucose, Bld: 183 mg/dL — ABNORMAL HIGH (ref 70–99)
Potassium: 4.8 mmol/L (ref 3.5–5.1)
Potassium: 4 mmol/L (ref 3.5–5.1)
Sodium: 139 mmol/L (ref 135–145)
Sodium: 141 mmol/L (ref 135–145)

## 2020-12-14 LAB — GLUCOSE, CAPILLARY
Glucose-Capillary: 181 mg/dL — ABNORMAL HIGH (ref 70–99)
Glucose-Capillary: 131 mg/dL — ABNORMAL HIGH (ref 70–99)

## 2020-12-14 MED ORDER — CEPHALEXIN 250 MG PO CAPS: ORAL_CAPSULE | ORAL | 0 refills | Status: DC

## 2020-12-14 NOTE — Care Management Obs Status (Signed)
[facility] NOTIFICATION   Patient Details  Name: Jeffrey Holmes MRN: [id number] Date of Birth: 03/14/1951   Medicare Observation Status Notification Given:  Yes    MahabirJuliann Pulse, RN 12/14/2020, 2:46 PM

## 2020-12-14 NOTE — Plan of Care (Signed)
  Problem: Coping: Goal: Level of anxiety will decrease Outcome: Progressing   Problem: Education: Goal: Knowledge of General Education information will improve Description: Including pain rating scale, medication(s)/side effects and non-pharmacologic comfort measures Outcome: Completed/Met   Problem: Health Behavior/Discharge Planning: Goal: Ability to manage health-related needs will improve Outcome: Completed/Met   Problem: Clinical Measurements: Goal: Will remain free from infection Outcome: Completed/Met Goal: Respiratory complications will improve Outcome: Completed/Met Goal: Cardiovascular complication will be avoided Outcome: Completed/Met   Problem: Activity: Goal: Risk for activity intolerance will decrease Outcome: Completed/Met   Problem: Nutrition: Goal: Adequate nutrition will be maintained Outcome: Completed/Met   Problem: Elimination: Goal: Will not experience complications related to bowel motility Outcome: Completed/Met Goal: Will not experience complications related to urinary retention Outcome: Completed/Met   Problem: Pain Managment: Goal: General experience of comfort will improve Outcome: Completed/Met   Problem: Safety: Goal: Ability to remain free from injury will improve Outcome: Completed/Met   Problem: Skin Integrity: Goal: Risk for impaired skin integrity will decrease Outcome: Completed/Met

## 2020-12-14 NOTE — Progress Notes (Signed)
1 Day Post-Op Subjective: Patient reports that he feels fine. Voiding well w/o discomfort.  Objective: Vital signs in last 24 hours: Temp:  [98 F (36.7 C)-98.6 F (37 C)] 98.1 F (36.7 C) (05/19 0650) Pulse Rate:  [55-79] 57 (05/19 0650) Resp:  [17-21] 18 (05/19 0216) BP: (124-179)/(65-104) 152/75 (05/19 0650) SpO2:  [96 %-100 %] 98 % (05/19 0650) Weight:  [81.1 kg] 81.1 kg (05/18 1701)  Intake/Output from previous day: 05/18 0701 - 05/19 0700 In: 870 [P.O.:470] Out: 1550 [Urine:1550] Intake/Output this shift: No intake/output data recorded.  Physical Exam:  Constitutional: Vital signs reviewed. WD WN in NAD   Eyes: PERRL, No scleral icterus.   Cardiovascular: RRR Pulmonary/Chest: Normal effort Extremities: No cyanosis or edema   Lab Results: Recent Labs    12/13/20 1705  HGB 14.9  HCT 44.6   BMET Recent Labs    12/13/20 1705 12/14/20 0442  NA 141 139  K 4.0 4.8  CL 109 110  CO2 19* 19*  GLUCOSE 104* 218*  BUN 73* 69*  CREATININE 9.84* 8.44*  CALCIUM 8.8* 8.7*   No results for input(s): LABPT, INR in the last 72 hours. No results for input(s): LABURIN in the last 72 hours. Results for orders placed or performed during the hospital encounter of 12/13/20  SARS Coronavirus 2 by RT PCR (hospital order, performed in [facility] hospital lab) Nasopharyngeal Nasopharyngeal Swab     Status: None   Collection Time: 12/13/20  5:05 PM   Specimen: Nasopharyngeal Swab  Result Value Ref Range Status   SARS Coronavirus 2 NEGATIVE NEGATIVE Final    Comment: (NOTE) SARS-CoV-2 target nucleic acids are NOT DETECTED.  The SARS-CoV-2 RNA is generally detectable in upper and lower respiratory specimens during the acute phase of infection. The lowest concentration of SARS-CoV-2 viral copies this assay can detect is 250 copies / mL. A negative result does not preclude SARS-CoV-2 infection and should not be used as the sole basis for treatment or other patient management  decisions.  A negative result may occur with improper specimen collection / handling, submission of specimen other than nasopharyngeal swab, presence of viral mutation(s) within the areas targeted by this assay, and inadequate number of viral copies (<250 copies / mL). A negative result must be combined with clinical observations, patient history, and epidemiological information.  Fact Sheet for Patients:   StrictlyIdeas.no  Fact Sheet for Healthcare Providers: BankingDealers.co.za  This test is not yet approved or  cleared by the [place] FDA and has been authorized for detection and/or diagnosis of SARS-CoV-2 by FDA under an Emergency Use Authorization (EUA).  This EUA will remain in effect (meaning this test can be used) for the duration of the COVID-19 declaration under Section 564(b)(1) of the Act, 21 U.S.C. section 360bbb-3(b)(1), unless the authorization is terminated or revoked sooner.  Performed at [facility], [place] [address], [place], [place] [address]     Studies/Results: DG C-Arm 1-60 Min-No Report  Result Date: 12/13/2020 Fluoroscopy was utilized by the requesting physician.  No radiographic interpretation.    Assessment/Plan:   POD 1 urgent Rt URS/stone mgmt and stent. He has significant ARF but excellent UOP and nml SCr.   I spoke w/ his nephrologist, Dr Boyce Medici. Will recheck labs later today and if continued downward trend will let go.   LOS: 1 day   Jorja Loa 12/14/2020, 8:42 AM

## 2020-12-15 NOTE — Anesthesia Postprocedure Evaluation (Signed)
Anesthesia Post Note  Patient: Jeffrey Holmes  Procedure(s) Performed: CYSTOSCOPY WITH RETROGRADE PYELOGRAM/URETERAL STENT PLACEMENT (Right Ureter) URETEROSCOPY WITH HOLMIUM LASER LITHOTRIPSY (Right Ureter)     Patient location during evaluation: PACU Anesthesia Type: General Level of consciousness: awake and alert Pain management: pain level controlled Vital Signs Assessment: post-procedure vital signs reviewed and stable Respiratory status: spontaneous breathing, nonlabored ventilation, respiratory function stable and patient connected to nasal cannula oxygen Cardiovascular status: blood pressure returned to baseline and stable Postop Assessment: no apparent nausea or vomiting Anesthetic complications: no   No complications documented.  Last Vitals:  Vitals:   12/14/20 1022 12/14/20 1244  BP: (!) 145/70 (!) 149/73  Pulse: 69 70  Resp:    Temp: 37 C 37 C  SpO2: 98% 96%    Last Pain:  Vitals:   12/14/20 1244  TempSrc: Oral  PainSc:                  [place]

## 2020-12-28 DIAGNOSIS — N202 Calculus of kidney with calculus of ureter: Secondary | ICD-10-CM | POA: Diagnosis not present

## 2020-12-28 DIAGNOSIS — N170 Acute kidney failure with tubular necrosis: Secondary | ICD-10-CM | POA: Diagnosis not present

## 2020-12-28 NOTE — Discharge Summary (Signed)
Patient ID: Jeffrey Holmes MRN: [id number] DOB/AGE: 71-01-1951 71 y.o.  Admit date: 12/13/2020 Discharge date: 12/28/2020  Primary Care Physician:  Pcp, No  Discharge Diagnoses:  Rt ureteral stone, hydronephrosis, ARF   Consults:  Dr Boyce Medici   Discharge Medications: Allergies as of 12/14/2020      Reactions   Vicodin [hydrocodone-acetaminophen] Nausea And Vomiting   Hydrocodone Nausea And Vomiting   Oxycontin [oxycodone Hcl] Nausea And Vomiting   Percocet [oxycodone-acetaminophen] Nausea And Vomiting      Medication List    TAKE these medications   atorvastatin 80 MG tablet Commonly known as: LIPITOR Take 1 tablet by mouth once daily   Basaglar KwikPen 100 UNIT/ML Inject 0.2 mLs (20 Units total) into the skin at bedtime. What changed: how much to take   cephALEXin 250 MG capsule Commonly known as: Keflex 1 capsule daily   clopidogrel 75 MG tablet Commonly known as: PLAVIX Take 1 tablet by mouth once daily   Durezol 0.05 % Emul Generic drug: Difluprednate Place 1 drop into the left eye daily. Use until bottle is empty   gatifloxacin 0.5 % Soln Commonly known as: ZYMAXID Place 1 drop into the left eye daily. Use until bottle is empty   insulin lispro 100 UNIT/ML injection Commonly known as: HUMALOG Inject 10-20 Units into the skin daily as needed for high blood sugar. With meals as needed  15-20 units with meals as needed  > 150= 20 units  ? 100= 15 units   latanoprost 0.005 % ophthalmic solution Commonly known as: XALATAN Place 1 drop into both eyes at bedtime.   lisinopril 20 MG tablet Commonly known as: ZESTRIL Take 20 mg by mouth daily.   loratadine 10 MG tablet Commonly known as: CLARITIN Take 10 mg by mouth daily as needed for allergies.   metoprolol succinate 50 MG 24 hr tablet Commonly known as: TOPROL-XL Take 1 tablet by mouth twice daily What changed: when to take this   montelukast 10 MG tablet Commonly known as: SINGULAIR Take 10 mg by  mouth at bedtime.   nitroGLYCERIN 0.4 MG SL tablet Commonly known as: NITROSTAT DISSOLVE ONE TABLET UNDER THE TONGUE EVERY 5 MINUTES AS NEEDED FOR CHEST PAIN.  DO NOT EXCEED A TOTAL OF 3 DOSES IN 15 MINUTES NOW What changed: See the new instructions.   ondansetron 4 MG tablet Commonly known as: ZOFRAN Take 4 mg by mouth every 4 (four) hours as needed for nausea or vomiting.   pantoprazole 40 MG tablet Commonly known as: PROTONIX Take 1 tablet (40 mg total) by mouth daily before breakfast. What changed:   when to take this  reasons to take this   Prolensa 0.07 % Soln Generic drug: Bromfenac Sodium Place 1 drop into the left eye daily. Use until bottle is empty   Repatha SureClick 102 MG/ML Soaj Generic drug: Evolocumab Inject 1 pen into the skin every 14 (fourteen) days.   tadalafil 10 MG tablet Commonly known as: CIALIS Take 10 mg by mouth daily as needed for erectile dysfunction.   timolol 0.5 % ophthalmic solution Commonly known as: TIMOPTIC Place 1 drop into both eyes 2 (two) times daily.   traMADol 50 MG tablet Commonly known as: ULTRAM Take 50 mg by mouth every 6 (six) hours as needed for moderate pain.        Significant Diagnostic Studies:  DG C-Arm 1-60 Min-No Report  Result Date: 12/13/2020 Fluoroscopy was utilized by the requesting physician.  No radiographic interpretation.  Brief H and P: For complete details please refer to admission H and P, but in brief pt admitted for urgent mgmt of an obstructing  Rt ureteral stone w/ hydro and acute on chronic renal failure  Hospital Course: Pt taken urgently to the OR for URS and stenting. No problems related to procedure. Following the procedure, SCr trended down and will be followed post-discharge by Dr Boyce Medici.[name] Active Problems:   Kidney stone on right side   Day of Discharge BP (!) 149/73 (BP Location: Right Arm)   Pulse 70   Temp 98.6 F (37 C) (Oral)   Resp 18   Ht 5\' 5"  (1.651 m)   Wt 81.1  kg   SpO2 96%   BMI 29.75 kg/m   No results found for this or any previous visit (from the past 24 hour(s)).  Physical Exam: General: Alert and awake oriented x3 not in any acute distress. HEENT: anicteric sclera, pupils reactive to light and accommodation CVS: S1-S2 clear no murmur rubs or gallops Chest: clear to auscultation bilaterally, no wheezing rales or rhonchi Abdomen: soft nontender, nondistended, normal bowel sounds, no organomegaly Extremities: no cyanosis, clubbing or edema noted bilaterally Neuro: Cranial nerves II-XII intact, no focal neurological deficits  Disposition:  Home  Diet:  Regular  Activity:  No restrictions   TESTS THAT NEED FOLLOW-UP   Followup BMP  DISCHARGE FOLLOW-UP    Time spent on Discharge:   15 mins  Signed: Lillette Boxer Marcellous Snarski 12/28/2020, 7:32 AM

## 2021-01-03 DIAGNOSIS — N1832 Chronic kidney disease, stage 3b: Secondary | ICD-10-CM | POA: Diagnosis not present

## 2021-01-08 DIAGNOSIS — R809 Proteinuria, unspecified: Secondary | ICD-10-CM | POA: Diagnosis not present

## 2021-01-08 DIAGNOSIS — N261 Atrophy of kidney (terminal): Secondary | ICD-10-CM | POA: Diagnosis not present

## 2021-01-08 DIAGNOSIS — I129 Hypertensive chronic kidney disease with stage 1 through stage 4 chronic kidney disease, or unspecified chronic kidney disease: Secondary | ICD-10-CM | POA: Diagnosis not present

## 2021-01-08 DIAGNOSIS — N135 Crossing vessel and stricture of ureter without hydronephrosis: Secondary | ICD-10-CM | POA: Diagnosis not present

## 2021-01-08 DIAGNOSIS — M109 Gout, unspecified: Secondary | ICD-10-CM | POA: Diagnosis not present

## 2021-01-08 DIAGNOSIS — N1832 Chronic kidney disease, stage 3b: Secondary | ICD-10-CM | POA: Diagnosis not present

## 2021-01-08 DIAGNOSIS — E559 Vitamin D deficiency, unspecified: Secondary | ICD-10-CM | POA: Diagnosis not present

## 2021-02-07 DIAGNOSIS — K219 Gastro-esophageal reflux disease without esophagitis: Secondary | ICD-10-CM | POA: Diagnosis not present

## 2021-02-07 DIAGNOSIS — J3089 Other allergic rhinitis: Secondary | ICD-10-CM | POA: Diagnosis not present

## 2021-02-07 DIAGNOSIS — H1045 Other chronic allergic conjunctivitis: Secondary | ICD-10-CM | POA: Diagnosis not present

## 2021-03-30 ENCOUNTER — Other Ambulatory Visit: Payer: Self-pay | Admitting: Cardiology

## 2021-05-08 ENCOUNTER — Ambulatory Visit (INDEPENDENT_AMBULATORY_CARE_PROVIDER_SITE_OTHER): Payer: Medicare Other | Admitting: Cardiology

## 2021-05-08 ENCOUNTER — Encounter: Payer: Self-pay | Admitting: Cardiology

## 2021-05-08 ENCOUNTER — Other Ambulatory Visit: Payer: Self-pay

## 2021-05-08 VITALS — BP 160/84 | HR 73 | Ht 65.0 in | Wt 188.8 lb

## 2021-05-08 DIAGNOSIS — I10 Essential (primary) hypertension: Secondary | ICD-10-CM | POA: Diagnosis not present

## 2021-05-08 DIAGNOSIS — I2510 Atherosclerotic heart disease of native coronary artery without angina pectoris: Secondary | ICD-10-CM

## 2021-05-08 DIAGNOSIS — R072 Precordial pain: Secondary | ICD-10-CM

## 2021-05-08 DIAGNOSIS — E7800 Pure hypercholesterolemia, unspecified: Secondary | ICD-10-CM | POA: Diagnosis not present

## 2021-05-08 MED ORDER — REPATHA SURECLICK 140 MG/ML ~~LOC~~ SOAJ: 1.0000 "pen " | SUBCUTANEOUS | 3 refills | Status: DC

## 2021-05-08 MED ORDER — ATORVASTATIN CALCIUM 80 MG PO TABS: 80.0000 mg | ORAL_TABLET | Freq: Every day | ORAL | 3 refills | Status: DC

## 2021-05-08 MED ORDER — METOPROLOL SUCCINATE ER 50 MG PO TB24: 50.0000 mg | ORAL_TABLET | Freq: Two times a day (BID) | ORAL | 2 refills | Status: DC

## 2021-05-08 NOTE — Addendum Note (Signed)
Addended by: Antonieta Iba on: 05/08/2021 02:41 PM   Modules accepted: Orders

## 2021-05-08 NOTE — Progress Notes (Signed)
Date:  05/08/2021   ID:  Jeffrey Holmes, DOB Dec 23, 1950, MRN [id number]  PCP:  Pcp, No  Cardiologist:  Fransico Him, MD Electrophysiologist:  None   Chief Complaint:  CAD, HTN, Hyperlipidemia  History of Present Illness:     Jeffrey Holmes is a 70 y.o. 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  He also has a history of HTN, dyslipidemia and CKD stage 3.  He had a nuclear stress test a year ago that was normal.   He is here today for followup and is doing well.  Recently he has had some anginal chest pain that is new since his CABG.  It occurs once monthly and lasts about a day off and on.  There is no associated nausea or diaphoresis.  He also has developed DOE recently with walking but it resolves while he is still walking. He denies any PND, orthopnea, LE edema, dizziness, palpitations or syncope. He is compliant with his meds and is tolerating meds with no SE.     Prior CV studies:   The following studies were reviewed today:  None   Past Medical History:  Diagnosis Date   Allergy    Benign essential HTN 08/08/2015   CAD (coronary artery disease), native coronary artery 08/28/2015   Severe multivessel ASCAD s/p CABG hybrid with LIMA to LAD and diag and PCI of the RCA.    Chronic back pain    L5-S1   Chronic kidney disease, stage 3 (HCC)    multiple kidney stones   Diabetes mellitus without complication (HCC)    TYPE 2   GERD (gastroesophageal reflux disease)    OCC   H/O hiatal hernia    Hepatic cyst 11/01/2016   History of acute renal failure 2015   secondary to stones, ELEVATED CREATININE, NO DIALYSIS DONE   History of kidney stones    Hx of adenomatous colonic polyps 06/11/2017   Hyperlipidemia 08/08/2015   NAFLD (nonalcoholic fatty liver disease) 11/01/2016   PONV (postoperative nausea and vomiting)    hx of years ago    Splenic cyst 11/01/2016   Ureteral calculus 01/06/2014   Past Surgical History:   Procedure Laterality Date   ANGIOPLASTY N/A 08/21/2015   Procedure: ANGIOPLASTY WITH PERCUTANEOUS CORONARY INTERVENTION WITH DES TO RIGHT CORONARY ARTERY;  Surgeon: Jettie Booze, MD;  Location: [place];  Service: Cardiovascular;  Laterality: N/A;   BACK SURGERY  2006   microdisectomy L5-S1   CARDIAC CATHETERIZATION N/A 08/18/2015   Procedure: Left Heart Cath and Coronary Angiography;  Surgeon: Jettie Booze, MD;  Location: [place] [facility];  Service: Cardiovascular;  Laterality: N/A;   CARDIAC CATHETERIZATION N/A 08/21/2015   Procedure: Coronary Stent Intervention;  Surgeon: Jettie Booze, MD;  Location: [place] [facility];  Service: Cardiovascular;  Laterality: N/A;   CARDIAC CATHETERIZATION  08/21/2015   Procedure: Bypass Graft Angiography;  Surgeon: Jettie Booze, MD;  Location: [place] [facility];  Service: Cardiovascular;;   CARDIOVASCULAR STRESS TEST  08/15/2015   cataract surgery      COLONOSCOPY     CORONARY ARTERY BYPASS GRAFT N/A 08/21/2015   Procedure: OFF PUMP CORONARY ARTERY BYPASS GRAFTING (CABG), TIMES TWO, USING LEFT INTERNAL MAMMARY ARTERY, RIGHT GREATER SAPHENOUS VEIN HARVESTED ENDOSCOPICALLY;  Surgeon: [name], MD;  Location: [place];  Service: Open Heart Surgery;  Laterality: N/A;   CYSTOSCOPY W/ URETERAL  STENT PLACEMENT Right 03/12/2017   Procedure: CYSTOSCOPY WITH RETROGRADE PYELOGRAM/URETERAL STENT PLACEMENT;  Surgeon: Bjorn Loser, MD;  Location: [facility];  Service: Urology;  Laterality: Right;   CYSTOSCOPY W/ URETERAL STENT PLACEMENT Right 12/13/2020   Procedure: CYSTOSCOPY WITH RETROGRADE PYELOGRAM/URETERAL STENT PLACEMENT;  Surgeon: Franchot Gallo, MD;  Location: [facility];  Service: Urology;  Laterality: Right;   CYSTOSCOPY WITH RETROGRADE PYELOGRAM, URETEROSCOPY AND STENT PLACEMENT Right 01/06/2014   Procedure: CYSTOSCOPY WITH RETROGRADE PYELOGRAM, URETEROSCOPY AND STENT PLACEMENT;  Surgeon: Bernestine Amass, MD;  Location: [facility];   Service: Urology;  Laterality: Right;   CYSTOSCOPY/URETEROSCOPY/HOLMIUM LASER/STENT PLACEMENT Right 03/25/2017   Procedure: RIGHT URETEROSCOPY WITH HOLMIUM LASER STENT EXCHANGE;  Surgeon: [name], MD;  Location: [facility];  Service: Urology;  Laterality: Right;   CYSTOSCOPY/URETEROSCOPY/HOLMIUM LASER/STENT PLACEMENT Bilateral 11/17/2020   Procedure: CYSTOSCOPY/URETEROSCOPY/HOLMIUM LASER/STENT PLACEMENT;  Surgeon: Lucas Mallow, MD;  Location: [facility];  Service: Urology;  Laterality: Bilateral;   CYSTOSCOPY/URETEROSCOPY/HOLMIUM LASER/STENT PLACEMENT Bilateral 12/05/2020   Procedure: CYSTOSCOPY BILATERAL /URETEROSCOPY/HOLMIUM LASER/STENT PLACEMENT;  Surgeon: [name], MD;  Location: [facility];  Service: Urology;  Laterality: Bilateral;   ESOPHAGOGASTRODUODENOSCOPY ENDOSCOPY     normal   LITHOTRIPSY     2-3 times in past   TEE WITHOUT CARDIOVERSION N/A 08/21/2015   Procedure: TRANSESOPHAGEAL ECHOCARDIOGRAM (TEE);  Surgeon: [name], MD;  Location: [place];  Service: Open Heart Surgery;  Laterality: N/A;   TONSILLECTOMY     URETEROSCOPY WITH HOLMIUM LASER LITHOTRIPSY Right 12/13/2020   Procedure: URETEROSCOPY WITH HOLMIUM LASER LITHOTRIPSY;  Surgeon: Franchot Gallo, MD;  Location: [facility];  Service: Urology;  Laterality: Right;   WISDOM TOOTH EXTRACTION       Current Meds  Medication Sig   atorvastatin (LIPITOR) 80 MG tablet Take 1 tablet by mouth once daily   clopidogrel (PLAVIX) 75 MG tablet Take 1 tablet by mouth once daily   Evolocumab (REPATHA SURECLICK) 268 MG/ML SOAJ Inject 1 pen into the skin every 14 (fourteen) days.   Insulin Glargine (BASAGLAR KWIKPEN) 100 UNIT/ML SOPN Inject 0.2 mLs (20 Units total) into the skin at bedtime. (Patient taking differently: Inject 24 Units into the skin at bedtime.)   insulin lispro (HUMALOG) 100 UNIT/ML injection Inject 10-20 Units into the skin daily as needed for high blood sugar. With meals as needed  15-20 units with meals as needed  >  150= 20 units  ? 100= 15 units   lisinopril (ZESTRIL) 20 MG tablet Take 20 mg by mouth daily.   loratadine (CLARITIN) 10 MG tablet Take 10 mg by mouth daily as needed for allergies.   metoprolol succinate (TOPROL-XL) 50 MG 24 hr tablet Take 1 tablet by mouth twice daily (Patient taking differently: Take 50 mg by mouth daily.)   montelukast (SINGULAIR) 10 MG tablet Take 10 mg by mouth at bedtime.   nitroGLYCERIN (NITROSTAT) 0.4 MG SL tablet DISSOLVE ONE TABLET UNDER THE TONGUE EVERY 5 MINUTES AS NEEDED FOR CHEST PAIN.  DO NOT EXCEED A TOTAL OF 3 DOSES IN 15 MINUTES NOW (Patient taking differently: Place 0.4 mg under the tongue every 5 (five) minutes as needed for chest pain.)   ondansetron (ZOFRAN) 4 MG tablet Take 4 mg by mouth every 4 (four) hours as needed for nausea or vomiting.   pantoprazole (PROTONIX) 40 MG tablet Take 1 tablet (40 mg total) by mouth daily before breakfast. (Patient taking differently: Take 40 mg by mouth daily as needed (indigestion).)   tadalafil (CIALIS) 10 MG tablet Take  10 mg by mouth daily as needed for erectile dysfunction.   timolol (TIMOPTIC) 0.5 % ophthalmic solution Place 1 drop into both eyes 2 (two) times daily.    traMADol (ULTRAM) 50 MG tablet Take 50 mg by mouth every 6 (six) hours as needed for moderate pain.     Allergies:   Vicodin [hydrocodone-acetaminophen], Hydrocodone, Oxycontin [oxycodone hcl], and Percocet [oxycodone-acetaminophen]   Social History   Tobacco Use   Smoking status: Never   Smokeless tobacco: Never  Vaping Use   Vaping Use: Never used  Substance Use Topics   Alcohol use: Never   Drug use: No     Family Hx: The patient's family history includes Diabetes in his mother and sister; Heart attack in his mother; Kidney failure in his mother; Throat cancer in his father. There is no history of Colon cancer, Colon polyps, Rectal cancer, or Stomach cancer.  ROS:   Please see the history of present illness.     All other systems  reviewed and are negative.   Labs/Other Tests and Data Reviewed:    Recent Labs: 11/17/2020: ALT 14 12/13/2020: Hemoglobin 14.9; Platelets 229 12/14/2020: BUN 69; Creatinine, Ser 7.49; Potassium 4.0; Sodium 141   Recent Lipid Panel Lab Results  Component Value Date/Time   CHOL 83 (L) 06/02/2020 07:41 AM   TRIG 150 (H) 06/02/2020 07:41 AM   HDL 38 (L) 06/02/2020 07:41 AM   CHOLHDL 2.2 06/02/2020 07:41 AM   CHOLHDL 1.6 07/08/2016 08:55 AM   LDLCALC 20 06/02/2020 07:41 AM   LDLDIRECT 13 07/08/2016 08:55 AM    Wt Readings from Last 3 Encounters:  05/08/21 188 lb 12.8 oz (85.6 kg)  12/13/20 178 lb 12.8 oz (81.1 kg)  12/01/20 161 lb (73 kg)     Objective:    Vital Signs:  BP (!) 160/84   Pulse 73   Ht 5\' 5"  (1.651 m)   Wt 188 lb 12.8 oz (85.6 kg)   SpO2 95%   BMI 31.42 kg/m    GEN: Well nourished, well developed in no acute distress HEENT: Normal NECK: No JVD; No carotid bruits LYMPHATICS: No lymphadenopathy CARDIAC:RRR, no murmurs, rubs, gallops RESPIRATORY:  Clear to auscultation without rales, wheezing or rhonchi  ABDOMEN: Soft, non-tender, non-distended MUSCULOSKELETAL:  No edema; No deformity  SKIN: Warm and dry NEUROLOGIC:  Alert and oriented x 3 PSYCHIATRIC:  Normal affect    EKG performed today and showed NSR  ASSESSMENT & PLAN:    1.  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   -nuclear stress test a year ago showed no ischemia. -he recently has started having anginal CP over the past year which is very sporadic and is also now having DOE -I will get a Lexiscan myoview to rule out ischemia -Shared Decision Making/Informed Consent The risks [chest pain, shortness of breath, cardiac arrhythmias, dizziness, blood pressure fluctuations, myocardial infarction, stroke/transient ischemic attack, nausea, vomiting, allergic reaction, radiation exposure, metallic taste sensation and life-threatening  complications (estimated to be 1 in 10,000)], benefits (risk stratification, diagnosing coronary artery disease, treatment guidance) and alternatives of a nuclear stress test were discussed in detail with Mr. Sisneros and he agrees to proceed. -continue ASA, Plavix, BB and statin   2.  Hypertension  -BP is elevated on exam today>he did not take his BP meds this am -continue prescription drug management with Toprol XL 50mg  BID, Lisinopril 20mg  daily with PRN refills  3.  Hyperlipidemia  -  his LDL goal is < 70.  -check FLP and ALT -He will continue on prescription drug management with atorvastatin 80mg  daily and Repatha and >refilled   Medication Adjustments/Labs and Tests Ordered: Current medicines are reviewed at length with the patient today.  Concerns regarding medicines are outlined above.  Tests Ordered: Orders Placed This Encounter  Procedures   EKG 12-Lead    Medication Changes: No orders of the defined types were placed in this encounter.   Disposition:  Follow up in 1 year(s)  Signed, Fransico Him, MD  05/08/2021 2:29 PM    [place]

## 2021-05-08 NOTE — Patient Instructions (Addendum)
Medication Instructions:  Your physician recommends that you continue on your current medications as directed. Please refer to the Current Medication list given to you today.  *If you need a refill on your cardiac medications before your next appointment, please call your pharmacy*   Lab Work: Fastin lipids and ALT on same day as stress test.   If you have labs (blood work) drawn today and your tests are completely normal, you will receive your results only by: [place] (if you have MyChart) OR A paper copy in the mail If you have any lab test that is abnormal or we need to change your treatment, we will call you to review the results.  Testing/Procedures: Your physician has requested that you have en exercise stress myoview. For further information please visit [email or web address]. Please follow instruction sheet, as given.   Follow-Up: At [facility], you and your health needs are our priority.  As part of our continuing mission to provide you with exceptional heart care, we have created designated Provider Care Teams.  These Care Teams include your primary Cardiologist (physician) and Advanced Practice Providers (APPs -  Physician Assistants and Nurse Practitioners) who all work together to provide you with the care you need, when you need it.  Your next appointment:   1 year(s)  The format for your next appointment:   In Person  Provider:   You may see Fransico Him, MD or one of the following Advanced Practice Providers on your designated Care Team:   Melina Copa, PA-C Ermalinda Barrios, PA-C

## 2021-05-08 NOTE — Addendum Note (Signed)
Addended by: Fransico Him R on: 05/08/2021 02:54 PM   Modules accepted: Orders

## 2021-05-09 ENCOUNTER — Telehealth (HOSPITAL_COMMUNITY): Payer: Self-pay | Admitting: *Deleted

## 2021-05-09 NOTE — Telephone Encounter (Signed)
Left message on voicemail per DPR in reference to upcoming appointment scheduled on 05/14/21 at 7:30 with detailed instructions given per Myocardial Perfusion Study Information Sheet for the test. LM to arrive 15 minutes early, and that it is imperative to arrive on time for appointment to keep from having the test rescheduled. If you need to cancel or reschedule your appointment, please call the office within 24 hours of your appointment. Failure to do so may result in a cancellation of your appointment, and a $50 no show fee. Phone number given for call back for any questions.

## 2021-05-10 DIAGNOSIS — Z794 Long term (current) use of insulin: Secondary | ICD-10-CM | POA: Diagnosis not present

## 2021-05-10 DIAGNOSIS — E1151 Type 2 diabetes mellitus with diabetic peripheral angiopathy without gangrene: Secondary | ICD-10-CM | POA: Diagnosis not present

## 2021-05-10 DIAGNOSIS — E785 Hyperlipidemia, unspecified: Secondary | ICD-10-CM | POA: Diagnosis not present

## 2021-05-10 DIAGNOSIS — I10 Essential (primary) hypertension: Secondary | ICD-10-CM | POA: Diagnosis not present

## 2021-05-10 DIAGNOSIS — E1165 Type 2 diabetes mellitus with hyperglycemia: Secondary | ICD-10-CM | POA: Diagnosis not present

## 2021-05-10 DIAGNOSIS — I2510 Atherosclerotic heart disease of native coronary artery without angina pectoris: Secondary | ICD-10-CM | POA: Diagnosis not present

## 2021-05-10 DIAGNOSIS — N1832 Chronic kidney disease, stage 3b: Secondary | ICD-10-CM | POA: Diagnosis not present

## 2021-05-14 ENCOUNTER — Ambulatory Visit (HOSPITAL_COMMUNITY): Payer: Medicare Other | Attending: Cardiology

## 2021-05-14 ENCOUNTER — Other Ambulatory Visit: Payer: Self-pay

## 2021-05-14 ENCOUNTER — Other Ambulatory Visit: Payer: Medicare Other | Admitting: *Deleted

## 2021-05-14 DIAGNOSIS — R072 Precordial pain: Secondary | ICD-10-CM | POA: Diagnosis not present

## 2021-05-14 DIAGNOSIS — I10 Essential (primary) hypertension: Secondary | ICD-10-CM

## 2021-05-14 DIAGNOSIS — I2510 Atherosclerotic heart disease of native coronary artery without angina pectoris: Secondary | ICD-10-CM | POA: Diagnosis not present

## 2021-05-14 DIAGNOSIS — E7800 Pure hypercholesterolemia, unspecified: Secondary | ICD-10-CM

## 2021-05-14 LAB — MYOCARDIAL PERFUSION IMAGING
Angina Index: 0
Duke Treadmill Score: 6
Estimated workload: 7
Exercise duration (min): 6 min
Exercise duration (sec): 0 s
LV dias vol: 78 mL (ref 62–150)
LV sys vol: 30 mL
MPHR: 149 {beats}/min
Nuc Stress EF: 61 %
Peak HR: 136 {beats}/min
Percent HR: 91 %
Rest HR: 61 {beats}/min
Rest Nuclear Isotope Dose: 10.4 mCi
SDS: 0
SRS: 0
SSS: 0
ST Elevation (mm): 0.5 mm
Stress Nuclear Isotope Dose: 31.7 mCi
TID: 1.08

## 2021-05-14 LAB — ALT: ALT: 13 IU/L (ref 0–44)

## 2021-05-14 LAB — LIPID PANEL
Chol/HDL Ratio: 5.5 ratio — ABNORMAL HIGH (ref 0.0–5.0)
Cholesterol, Total: 224 mg/dL — ABNORMAL HIGH (ref 100–199)
HDL: 41 mg/dL (ref >39)
LDL Chol Calc (NIH): 140 mg/dL — ABNORMAL HIGH (ref 0–99)
Triglycerides: 239 mg/dL — ABNORMAL HIGH (ref 0–149)
VLDL Cholesterol Cal: 43 mg/dL — ABNORMAL HIGH (ref 5–40)

## 2021-05-14 MED ORDER — TECHNETIUM TC 99M TETROFOSMIN IV KIT
10.4000 | PACK | Freq: Once | INTRAVENOUS | Status: AC | PRN
  Administered 2021-05-14: 10.4 via INTRAVENOUS
  Filled 2021-05-14: qty 11

## 2021-05-14 MED ORDER — TECHNETIUM TC 99M TETROFOSMIN IV KIT
31.7000 | PACK | Freq: Once | INTRAVENOUS | Status: AC | PRN
  Administered 2021-05-14: 31.7 via INTRAVENOUS
  Filled 2021-05-14: qty 32

## 2021-05-15 ENCOUNTER — Telehealth: Payer: Self-pay

## 2021-05-15 DIAGNOSIS — R030 Elevated blood-pressure reading, without diagnosis of hypertension: Secondary | ICD-10-CM

## 2021-05-15 DIAGNOSIS — I10 Essential (primary) hypertension: Secondary | ICD-10-CM

## 2021-05-15 NOTE — Telephone Encounter (Signed)
Left message for patient to call back  

## 2021-05-15 NOTE — Telephone Encounter (Signed)
Sueanne Margarita, MD  05/14/2021 11:11 PM EDT     Please find out if he is still having SOB   Sueanne Margarita, MD  05/14/2021 11:10 PM EDT     Please let patient know that stress test was fine   The patient has been notified of the result and verbalized understanding.  All questions (if any) were answered. Antonieta Iba, RN 05/15/2021 10:06 AM   BP monitor has been ordered.   Patient reports that he is still having some shortness of breath with exertion.

## 2021-05-15 NOTE — Telephone Encounter (Signed)
-----   Message from Sueanne Margarita, MD sent at 05/14/2021 11:11 PM EDT ----- Hypertensive BP response to exercise but likely held his BB - please get a 48 hour BP monitor to assess adequacy of Bp control

## 2021-06-05 DIAGNOSIS — N1832 Chronic kidney disease, stage 3b: Secondary | ICD-10-CM | POA: Diagnosis not present

## 2021-06-05 DIAGNOSIS — M109 Gout, unspecified: Secondary | ICD-10-CM | POA: Diagnosis not present

## 2021-06-05 DIAGNOSIS — E559 Vitamin D deficiency, unspecified: Secondary | ICD-10-CM | POA: Diagnosis not present

## 2021-06-05 DIAGNOSIS — I129 Hypertensive chronic kidney disease with stage 1 through stage 4 chronic kidney disease, or unspecified chronic kidney disease: Secondary | ICD-10-CM | POA: Diagnosis not present

## 2021-06-05 DIAGNOSIS — N261 Atrophy of kidney (terminal): Secondary | ICD-10-CM | POA: Diagnosis not present

## 2021-06-05 DIAGNOSIS — R809 Proteinuria, unspecified: Secondary | ICD-10-CM | POA: Diagnosis not present

## 2021-06-07 ENCOUNTER — Telehealth: Payer: Self-pay | Admitting: *Deleted

## 2021-06-07 NOTE — Telephone Encounter (Signed)
LMVM calling to schedule 24 hour ambulatory blood pressure monitor.  Please call Darrick Penna or Valetta Fuller in monitors at [phone].

## 2021-06-08 NOTE — Telephone Encounter (Signed)
Patient is calling about scheduling an appt to be fitted for his heart monitor.

## 2021-06-11 NOTE — Telephone Encounter (Signed)
Scheduled 24 hour ambulatory blood pressure monitor Tuesday , 06/26/21, 9:00 AM.

## 2021-06-26 ENCOUNTER — Other Ambulatory Visit: Payer: Self-pay

## 2021-06-26 ENCOUNTER — Ambulatory Visit (INDEPENDENT_AMBULATORY_CARE_PROVIDER_SITE_OTHER): Payer: Medicare Other

## 2021-06-26 DIAGNOSIS — I10 Essential (primary) hypertension: Secondary | ICD-10-CM | POA: Diagnosis not present

## 2021-06-26 DIAGNOSIS — R030 Elevated blood-pressure reading, without diagnosis of hypertension: Secondary | ICD-10-CM | POA: Diagnosis not present

## 2021-06-27 ENCOUNTER — Telehealth: Payer: Self-pay

## 2021-06-27 DIAGNOSIS — I10 Essential (primary) hypertension: Secondary | ICD-10-CM

## 2021-06-27 MED ORDER — AMLODIPINE BESYLATE 5 MG PO TABS: 5.0000 mg | ORAL_TABLET | Freq: Every day | ORAL | 3 refills | Status: DC

## 2021-06-27 NOTE — Telephone Encounter (Signed)
The patient has been notified of the result and verbalized understanding.  All questions (if any) were answered. Antonieta Iba, RN 06/27/2021 4:47 PM  Rx has been sent in. Patient has been schedule in HTN clinic

## 2021-06-27 NOTE — Telephone Encounter (Signed)
-----   Message from Sueanne Margarita, MD sent at 06/27/2021  4:02 PM EST ----- BP is not adequately controlled.  Please start on amlodipine 5 mg daily and follow-up with Pharm.D. in 2 weeks in hypertension clinic

## 2021-07-18 ENCOUNTER — Ambulatory Visit: Payer: Medicare Other | Admitting: Pharmacist

## 2021-07-18 NOTE — Progress Notes (Deleted)
Patient ID: Jeffrey Holmes                 DOB: Apr 12, 1951                      MRN: [id number]     HPI: Jeffrey Holmes is a 70 y.o. male referred by Dr. Radford Pax to HTN clinic. 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 I previously followed with pt in 2017 for his BP and his lipids and started him on Repatha. Pt reported some chest pain at most recent visit with Dr Radford Pax in October. Underwent stress test which was low risk but pt had hypertensive BP response to exercise with peak BP at 223/70. He had a 24 home BP monitor with results as follows, and was started on amlodipine 5mg  daily and referred to HTN clinic: Overall average blood pressure 157/80 mmHg. Average awake blood pressure 159/80 mmHg Average asleep blood pressure 148/80 mmHg. 30% of systolic blood pressure was greater than 140 mmHg during waking hours and greater than 120 mg while asleep. 16% diastolic blood pressures were greater than 90 mmHg awake and greater than 80 mmHg asleep.   Tolerating amlo 5 well? Any LEE? Inc dose of amlo, could inc lisin but would need bmet first (SCr shot up to 7 when he had a kidneystone in may)  Current HTN meds: amlodipine 5mg  daily, lisinopril 20mg  daily, Toprol 50mg  BID  BP goal: <130/7mmHg  Family History: Diabetes in his mother and sister; Heart attack in his mother; Kidney failure in his mother; Throat cancer in his father.  Social History: Denies tobacco, alcohol, and drug use.  Diet:   Exercise:   Home BP readings:   Wt Readings from Last 3 Encounters:  05/14/21 188 lb (85.3 kg)  05/08/21 188 lb 12.8 oz (85.6 kg)  12/13/20 178 lb 12.8 oz (81.1 kg)   BP Readings from Last 3 Encounters:  05/08/21 (!) 160/84  12/14/20 (!) 149/73  12/05/20 (!) 186/96   Pulse Readings from Last 3 Encounters:  05/08/21 73  12/14/20 70  12/05/20 60    Renal function: CrCl cannot be calculated (Patient's  most recent lab result is older than the maximum 21 days allowed.).  Past Medical History:  Diagnosis Date   Allergy    Benign essential HTN 08/08/2015   CAD (coronary artery disease), native coronary artery 08/28/2015   Severe multivessel ASCAD s/p CABG hybrid with LIMA to LAD and diag and PCI of the RCA.    Chronic back pain    L5-S1   Chronic kidney disease, stage 3 (HCC)    multiple kidney stones   Diabetes mellitus without complication (HCC)    TYPE 2   GERD (gastroesophageal reflux disease)    OCC   H/O hiatal hernia    Hepatic cyst 11/01/2016   History of acute renal failure 2015   secondary to stones, ELEVATED CREATININE, NO DIALYSIS DONE   History of kidney stones    Hx of adenomatous colonic polyps 06/11/2017   Hyperlipidemia 08/08/2015   NAFLD (nonalcoholic fatty liver disease) 11/01/2016   PONV (postoperative nausea and vomiting)    hx of years ago    Splenic cyst 11/01/2016   Ureteral calculus 01/06/2014    Current Outpatient Medications on File Prior to Visit  Medication Sig Dispense Refill   amLODipine (NORVASC) 5 MG tablet Take  1 tablet (5 mg total) by mouth daily. 90 tablet 3   atorvastatin (LIPITOR) 80 MG tablet Take 1 tablet (80 mg total) by mouth daily. 90 tablet 3   cephALEXin (KEFLEX) 250 MG capsule 1 capsule daily (Patient not taking: Reported on 05/08/2021) 3 capsule 0   clopidogrel (PLAVIX) 75 MG tablet Take 1 tablet by mouth once daily 90 tablet 3   DUREZOL 0.05 % EMUL Place 1 drop into the left eye daily. Use until bottle is empty (Patient not taking: Reported on 05/08/2021)     Evolocumab (REPATHA SURECLICK) 350 MG/ML SOAJ Inject 1 pen into the skin every 14 (fourteen) days. 6 mL 3   gatifloxacin (ZYMAXID) 0.5 % SOLN Place 1 drop into the left eye daily. Use until bottle is empty (Patient not taking: Reported on 05/08/2021)     Insulin Glargine (BASAGLAR KWIKPEN) 100 UNIT/ML SOPN Inject 0.2 mLs (20 Units total) into the skin at bedtime. (Patient taking  differently: Inject 24 Units into the skin at bedtime.) 5 pen 11   insulin lispro (HUMALOG) 100 UNIT/ML injection Inject 10-20 Units into the skin daily as needed for high blood sugar. With meals as needed  15-20 units with meals as needed  > 150= 20 units  ? 100= 15 units     latanoprost (XALATAN) 0.005 % ophthalmic solution Place 1 drop into both eyes at bedtime. (Patient not taking: Reported on 05/08/2021)     lisinopril (ZESTRIL) 20 MG tablet Take 20 mg by mouth daily.     loratadine (CLARITIN) 10 MG tablet Take 10 mg by mouth daily as needed for allergies.     metoprolol succinate (TOPROL-XL) 50 MG 24 hr tablet Take 1 tablet (50 mg total) by mouth 2 (two) times daily. Take with or immediately following a meal. 180 tablet 2   montelukast (SINGULAIR) 10 MG tablet Take 10 mg by mouth at bedtime.     nitroGLYCERIN (NITROSTAT) 0.4 MG SL tablet DISSOLVE ONE TABLET UNDER THE TONGUE EVERY 5 MINUTES AS NEEDED FOR CHEST PAIN.  DO NOT EXCEED A TOTAL OF 3 DOSES IN 15 MINUTES NOW (Patient taking differently: Place 0.4 mg under the tongue every 5 (five) minutes as needed for chest pain.) 25 tablet 1   ondansetron (ZOFRAN) 4 MG tablet Take 4 mg by mouth every 4 (four) hours as needed for nausea or vomiting.     pantoprazole (PROTONIX) 40 MG tablet Take 1 tablet (40 mg total) by mouth daily before breakfast. (Patient taking differently: Take 40 mg by mouth daily as needed (indigestion).) 30 tablet 11   PROLENSA 0.07 % SOLN Place 1 drop into the left eye daily. Use until bottle is empty (Patient not taking: Reported on 05/08/2021)     tadalafil (CIALIS) 10 MG tablet Take 10 mg by mouth daily as needed for erectile dysfunction.     timolol (TIMOPTIC) 0.5 % ophthalmic solution Place 1 drop into both eyes 2 (two) times daily.      traMADol (ULTRAM) 50 MG tablet Take 50 mg by mouth every 6 (six) hours as needed for moderate pain.     No current facility-administered medications on file prior to visit.     Allergies  Allergen Reactions   Vicodin [Hydrocodone-Acetaminophen] Nausea And Vomiting   Hydrocodone Nausea And Vomiting   Oxycontin [Oxycodone Hcl] Nausea And Vomiting   Percocet [Oxycodone-Acetaminophen] Nausea And Vomiting     Assessment/Plan:  1. Hypertension -

## 2021-08-10 ENCOUNTER — Other Ambulatory Visit: Payer: Self-pay

## 2021-08-10 ENCOUNTER — Ambulatory Visit (INDEPENDENT_AMBULATORY_CARE_PROVIDER_SITE_OTHER): Payer: Medicare Other | Admitting: Pharmacist

## 2021-08-10 VITALS — BP 152/82 | HR 75

## 2021-08-10 DIAGNOSIS — I10 Essential (primary) hypertension: Secondary | ICD-10-CM | POA: Diagnosis not present

## 2021-08-10 MED ORDER — AMLODIPINE BESYLATE 10 MG PO TABS: 10.0000 mg | ORAL_TABLET | Freq: Every day | ORAL | 3 refills | Status: DC

## 2021-08-10 NOTE — Progress Notes (Signed)
Patient ID: [name]                 DOB: 1951/02/14                      MRN: [id number]     HPI: Jeffrey Holmes is a 71 y.o. male referred by Dr. Radford Holmes to HTN clinic. PMH is significant for severe multivessel CAD s/p CABG with LIMA to LAD/Diag and PCI of RCA, HTN, HLD, and CKD III. BP was elevated at last MD visit 04/2021 however pt had not taken his BP meds yet that day. He reported some DOE with walking and chest pain at that visit and was scheduled for stress test which was low risk with hypertensive response (resting BP 167/88, increased to 223/70). Hen then wore home BP monitor with results as below and was started on amlodipine 5mg  daily with referral to PharmD for follow up.  Home BP monitor results: Overall average blood pressure 157/80 mmHg. Average awake blood pressure 159/80 mmHg Average asleep blood pressure 148/80 mmHg. 03% of systolic blood pressure was greater than 140 mmHg during waking hours and greater than 120 mg while asleep. 47% diastolic blood pressures were greater than 90 mmHg awake and greater than 80 mmHg asleep.  Pt presents today in good spirits. Reports tolerating medications well. Denies dizziness, headache, blurred vision, and LE edema. Has not taken his BP medications yet today, has been caring for his brother recently. Usually takes them in the morning but spaces them apart by a bit. Home BP readings have remained elevated around 150s/60-70s. High systolic in the 425Z, occasionally has seen in the upper 120s or low 140s. Does not use NSAIDs. Doesn't add salt to his food. Does drink tea but no other caffeine.  Current HTN meds: amlodipine 5mg  daily, lisinopril 20mg  daily, Toprol 50mg  BID  BP goal: <130/73mmHg  Family History: Mother with kidney disease, angina, and DM, sister with DM, father with throat cancer.   Social History: Denies alcohol, tobacco and drug use.  Diet: No coffee, likes sweet tea that's watered down, occasionally a diet soda but rare. Likes  eggs, hash browns in the AM, leftovers for dinner like meat loaf, potatoes. Doesn't add salt to food.  Exercise: Had been walking 5 miles a day, less lately while caring for brother.  Wt Readings from Last 3 Encounters:  05/14/21 188 lb (85.3 kg)  05/08/21 188 lb 12.8 oz (85.6 kg)  12/13/20 178 lb 12.8 oz (81.1 kg)   BP Readings from Last 3 Encounters:  05/08/21 (!) 160/84  12/14/20 (!) 149/73  12/05/20 (!) 186/96   Pulse Readings from Last 3 Encounters:  05/08/21 73  12/14/20 70  12/05/20 60    Renal function: CrCl cannot be calculated (Patient's most recent lab result is older than the maximum 21 days allowed.).  Past Medical History:  Diagnosis Date   Allergy    Benign essential HTN 08/08/2015   CAD (coronary artery disease), native coronary artery 08/28/2015   Severe multivessel ASCAD s/p CABG hybrid with LIMA to LAD and diag and PCI of the RCA.    Chronic back pain    L5-S1   Chronic kidney disease, stage 3 (HCC)    multiple kidney stones   Diabetes mellitus without complication ([place])    TYPE 2   GERD (gastroesophageal reflux disease)    OCC   H/O hiatal hernia    Hepatic cyst 11/01/2016   History of acute renal  failure 2015   secondary to stones, ELEVATED CREATININE, NO DIALYSIS DONE   History of kidney stones    Hx of adenomatous colonic polyps 06/11/2017   Hyperlipidemia 08/08/2015   NAFLD (nonalcoholic fatty liver disease) 11/01/2016   PONV (postoperative nausea and vomiting)    hx of years ago    Splenic cyst 11/01/2016   Ureteral calculus 01/06/2014    Current Outpatient Medications on File Prior to Visit  Medication Sig Dispense Refill   amLODipine (NORVASC) 5 MG tablet Take 1 tablet (5 mg total) by mouth daily. 90 tablet 3   atorvastatin (LIPITOR) 80 MG tablet Take 1 tablet (80 mg total) by mouth daily. 90 tablet 3   cephALEXin (KEFLEX) 250 MG capsule 1 capsule daily (Patient not taking: Reported on 05/08/2021) 3 capsule 0   clopidogrel (PLAVIX) 75 MG tablet  Take 1 tablet by mouth once daily 90 tablet 3   DUREZOL 0.05 % EMUL Place 1 drop into the left eye daily. Use until bottle is empty (Patient not taking: Reported on 05/08/2021)     Evolocumab (REPATHA SURECLICK) 287 MG/ML SOAJ Inject 1 pen into the skin every 14 (fourteen) days. 6 mL 3   gatifloxacin (ZYMAXID) 0.5 % SOLN Place 1 drop into the left eye daily. Use until bottle is empty (Patient not taking: Reported on 05/08/2021)     Insulin Glargine (BASAGLAR KWIKPEN) 100 UNIT/ML SOPN Inject 0.2 mLs (20 Units total) into the skin at bedtime. (Patient taking differently: Inject 24 Units into the skin at bedtime.) 5 pen 11   insulin lispro (HUMALOG) 100 UNIT/ML injection Inject 10-20 Units into the skin daily as needed for high blood sugar. With meals as needed  15-20 units with meals as needed  > 150= 20 units  ? 100= 15 units     latanoprost (XALATAN) 0.005 % ophthalmic solution Place 1 drop into both eyes at bedtime. (Patient not taking: Reported on 05/08/2021)     lisinopril (ZESTRIL) 20 MG tablet Take 20 mg by mouth daily.     loratadine (CLARITIN) 10 MG tablet Take 10 mg by mouth daily as needed for allergies.     metoprolol succinate (TOPROL-XL) 50 MG 24 hr tablet Take 1 tablet (50 mg total) by mouth 2 (two) times daily. Take with or immediately following a meal. 180 tablet 2   montelukast (SINGULAIR) 10 MG tablet Take 10 mg by mouth at bedtime.     nitroGLYCERIN (NITROSTAT) 0.4 MG SL tablet DISSOLVE ONE TABLET UNDER THE TONGUE EVERY 5 MINUTES AS NEEDED FOR CHEST PAIN.  DO NOT EXCEED A TOTAL OF 3 DOSES IN 15 MINUTES NOW (Patient taking differently: Place 0.4 mg under the tongue every 5 (five) minutes as needed for chest pain.) 25 tablet 1   ondansetron (ZOFRAN) 4 MG tablet Take 4 mg by mouth every 4 (four) hours as needed for nausea or vomiting.     pantoprazole (PROTONIX) 40 MG tablet Take 1 tablet (40 mg total) by mouth daily before breakfast. (Patient taking differently: Take 40 mg by mouth  daily as needed (indigestion).) 30 tablet 11   PROLENSA 0.07 % SOLN Place 1 drop into the left eye daily. Use until bottle is empty (Patient not taking: Reported on 05/08/2021)     tadalafil (CIALIS) 10 MG tablet Take 10 mg by mouth daily as needed for erectile dysfunction.     timolol (TIMOPTIC) 0.5 % ophthalmic solution Place 1 drop into both eyes 2 (two) times daily.      traMADol (  ULTRAM) 50 MG tablet Take 50 mg by mouth every 6 (six) hours as needed for moderate pain.     No current facility-administered medications on file prior to visit.    Allergies  Allergen Reactions   Vicodin [Hydrocodone-Acetaminophen] Nausea And Vomiting   Hydrocodone Nausea And Vomiting   Oxycontin [Oxycodone Hcl] Nausea And Vomiting   Percocet [Oxycodone-Acetaminophen] Nausea And Vomiting     Assessment/Plan:  1. Hypertension - BP remains elevated above goal < 130/71mmHg in clinic (hasn't taken meds today) and with home readings. Will increase amlodipine from 5mg  to 10mg  daily and continue lisinopril 20mg  daily and Toprol 50mg  BID. Pt encouraged to increase walking and limit daily sodium intake to 000mg  daily. Will follow up in 3-4 weeks for BP check. Pt advised to bring in home cuff and readings to appt.  Gara Kincade E. Jazlyne Gauger, PharmD, BCACP, [facility] [address], [place], [place] [address] Phone: [phone]; Fax: [phone] 08/10/2021 1:55 PM

## 2021-08-10 NOTE — Patient Instructions (Addendum)
It was nice to see you today!  Your blood pressure goal is < 130/36mmHg  Increase your amlodipine to 10mg  daily  Continue taking your other medications  Limit daily sodium to < 2,000mg   Increase walking as able  Check your blood pressure at home and bring in your log of readings and home blood pressure cuff to your next visit   Take your blood pressure medications in the morning the day of your next visit before you come in

## 2021-09-03 NOTE — Progress Notes (Deleted)
Patient ID: [name]                 DOB: 1950-09-10                      MRN: [id number]     HPI: Jeffrey Holmes is a 71 y.o. male referred by Dr. Radford Pax to HTN clinic. PMH is significant for severe multivessel CAD s/p CABG with LIMA to LAD/Diag and PCI of RCA, HTN, HLD, and CKD III. BP was elevated at last MD visit 04/2021 however pt had not taken his BP meds yet that day. He reported some DOE with walking and chest pain at that visit and was scheduled for stress test which was low risk with hypertensive response (resting BP 167/88, increased to 223/70). Hen then wore home BP monitor with results as below and was started on amlodipine 5mg  daily with referral to PharmD for follow up.  Home BP monitor results: Overall average blood pressure 157/80 mmHg. Average awake blood pressure 159/80 mmHg Average asleep blood pressure 148/80 mmHg. 24% of systolic blood pressure was greater than 140 mmHg during waking hours and greater than 120 mg while asleep. 40% diastolic blood pressures were greater than 90 mmHg awake and greater than 80 mmHg asleep.  At last visit with me on 08/10/21, BP remained elevated and amlodipine was increased to 10mg  daily.  Pt presents today in good spirits. Reports tolerating medications well. Denies dizziness, headache, blurred vision, and LE edema. Has not taken his BP medications yet today, has been caring for his brother recently. Usually takes them in the morning but spaces them apart by a bit. Home BP readings have remained elevated around 150s/60-70s. High systolic in the 102V, occasionally has seen in the upper 120s or low 140s. Does not use NSAIDs. Doesn't add salt to his food. Does drink tea but no other caffeine.  Home cuff and readings today? Did pt take meds before visit? Tolerating amlo 10 well? LEE? Would need bmet today before increasing lisinopril Could change toprol to coreg - equiv dose 12.5 BID if HR high, could go to 25 BID  Current HTN meds: amlodipine 10mg   daily, lisinopril 20mg  daily, Toprol 50mg  BID  BP goal: <130/3mmHg  Family History: Mother with kidney disease, angina, and DM, sister with DM, father with throat cancer.   Social History: Denies alcohol, tobacco and drug use.  Diet: No coffee, likes sweet tea that's watered down, occasionally a diet soda but rare. Likes eggs, hash browns in the AM, leftovers for dinner like meat loaf, potatoes. Doesn't add salt to food.  Exercise: Had been walking 5 miles a day, less lately while caring for brother.  Labs: 06/05/21: SCr 1.99  Wt Readings from Last 3 Encounters:  05/14/21 188 lb (85.3 kg)  05/08/21 188 lb 12.8 oz (85.6 kg)  12/13/20 178 lb 12.8 oz (81.1 kg)   BP Readings from Last 3 Encounters:  08/10/21 (!) 152/82  05/08/21 (!) 160/84  12/14/20 (!) 149/73   Pulse Readings from Last 3 Encounters:  08/10/21 75  05/08/21 73  12/14/20 70    Renal function: CrCl cannot be calculated (Patient's most recent lab result is older than the maximum 21 days allowed.).  Past Medical History:  Diagnosis Date   Allergy    Benign essential HTN 08/08/2015   CAD (coronary artery disease), native coronary artery 08/28/2015   Severe multivessel ASCAD s/p CABG hybrid with LIMA to LAD and diag and PCI of  the RCA.    Chronic back pain    L5-S1   Chronic kidney disease, stage 3 (HCC)    multiple kidney stones   Diabetes mellitus without complication (HCC)    TYPE 2   GERD (gastroesophageal reflux disease)    OCC   H/O hiatal hernia    Hepatic cyst 11/01/2016   History of acute renal failure 2015   secondary to stones, ELEVATED CREATININE, NO DIALYSIS DONE   History of kidney stones    Hx of adenomatous colonic polyps 06/11/2017   Hyperlipidemia 08/08/2015   NAFLD (nonalcoholic fatty liver disease) 11/01/2016   PONV (postoperative nausea and vomiting)    hx of years ago    Splenic cyst 11/01/2016   Ureteral calculus 01/06/2014    Current Outpatient Medications on File Prior to Visit   Medication Sig Dispense Refill   amLODipine (NORVASC) 10 MG tablet Take 1 tablet (10 mg total) by mouth daily. 90 tablet 3   atorvastatin (LIPITOR) 80 MG tablet Take 1 tablet (80 mg total) by mouth daily. 90 tablet 3   clopidogrel (PLAVIX) 75 MG tablet Take 1 tablet by mouth once daily 90 tablet 3   Evolocumab (REPATHA SURECLICK) 824 MG/ML SOAJ Inject 1 pen into the skin every 14 (fourteen) days. 6 mL 3   Insulin Glargine (BASAGLAR KWIKPEN) 100 UNIT/ML SOPN Inject 0.2 mLs (20 Units total) into the skin at bedtime. (Patient taking differently: Inject 24 Units into the skin at bedtime.) 5 pen 11   insulin lispro (HUMALOG) 100 UNIT/ML injection Inject 10-20 Units into the skin daily as needed for high blood sugar. With meals as needed  15-20 units with meals as needed  > 150= 20 units  ? 100= 15 units     latanoprost (XALATAN) 0.005 % ophthalmic solution Place 1 drop into both eyes at bedtime.     lisinopril (ZESTRIL) 20 MG tablet Take 20 mg by mouth daily.     loratadine (CLARITIN) 10 MG tablet Take 10 mg by mouth daily as needed for allergies.     metoprolol succinate (TOPROL-XL) 50 MG 24 hr tablet Take 1 tablet (50 mg total) by mouth 2 (two) times daily. Take with or immediately following a meal. 180 tablet 2   montelukast (SINGULAIR) 10 MG tablet Take 10 mg by mouth at bedtime.     nitroGLYCERIN (NITROSTAT) 0.4 MG SL tablet DISSOLVE ONE TABLET UNDER THE TONGUE EVERY 5 MINUTES AS NEEDED FOR CHEST PAIN.  DO NOT EXCEED A TOTAL OF 3 DOSES IN 15 MINUTES NOW (Patient taking differently: Place 0.4 mg under the tongue every 5 (five) minutes as needed for chest pain.) 25 tablet 1   ondansetron (ZOFRAN) 4 MG tablet Take 4 mg by mouth every 4 (four) hours as needed for nausea or vomiting.     pantoprazole (PROTONIX) 40 MG tablet Take 1 tablet (40 mg total) by mouth daily before breakfast. (Patient taking differently: Take 40 mg by mouth daily as needed (indigestion).) 30 tablet 11   tadalafil (CIALIS) 10  MG tablet Take 10 mg by mouth daily as needed for erectile dysfunction.     timolol (TIMOPTIC) 0.5 % ophthalmic solution Place 1 drop into both eyes 2 (two) times daily.      traMADol (ULTRAM) 50 MG tablet Take 50 mg by mouth every 6 (six) hours as needed for moderate pain.     No current facility-administered medications on file prior to visit.    Allergies  Allergen Reactions   Vicodin [Hydrocodone-Acetaminophen]  Nausea And Vomiting   Hydrocodone Nausea And Vomiting   Oxycontin [Oxycodone Hcl] Nausea And Vomiting   Percocet [Oxycodone-Acetaminophen] Nausea And Vomiting     Assessment/Plan:  1. Hypertension - BP remains elevated above goal < 130/48mmHg in clinic (hasn't taken meds today) and with home readings. Will increase amlodipine from 5mg  to 10mg  daily and continue lisinopril 20mg  daily and Toprol 50mg  BID. Pt encouraged to increase walking and limit daily sodium intake to 000mg  daily. Will follow up in 3-4 weeks for BP check. Pt advised to bring in home cuff and readings to appt.  Rache Klimaszewski E. Scarlette Hogston, PharmD, BCACP, [facility] [address], [place], [place] [address] Phone: [phone]; Fax: [phone] 09/03/2021 4:10 PM

## 2021-09-04 ENCOUNTER — Ambulatory Visit: Payer: Medicare Other

## 2021-09-10 DIAGNOSIS — I2510 Atherosclerotic heart disease of native coronary artery without angina pectoris: Secondary | ICD-10-CM | POA: Diagnosis not present

## 2021-09-10 DIAGNOSIS — E1151 Type 2 diabetes mellitus with diabetic peripheral angiopathy without gangrene: Secondary | ICD-10-CM | POA: Diagnosis not present

## 2021-09-10 DIAGNOSIS — Z794 Long term (current) use of insulin: Secondary | ICD-10-CM | POA: Diagnosis not present

## 2021-09-10 DIAGNOSIS — R634 Abnormal weight loss: Secondary | ICD-10-CM | POA: Diagnosis not present

## 2021-09-10 DIAGNOSIS — E785 Hyperlipidemia, unspecified: Secondary | ICD-10-CM | POA: Diagnosis not present

## 2021-09-10 DIAGNOSIS — I10 Essential (primary) hypertension: Secondary | ICD-10-CM | POA: Diagnosis not present

## 2021-09-10 DIAGNOSIS — N1832 Chronic kidney disease, stage 3b: Secondary | ICD-10-CM | POA: Diagnosis not present

## 2021-09-10 DIAGNOSIS — R946 Abnormal results of thyroid function studies: Secondary | ICD-10-CM | POA: Diagnosis not present

## 2021-09-12 ENCOUNTER — Encounter (HOSPITAL_COMMUNITY): Payer: Self-pay | Admitting: Emergency Medicine

## 2021-09-12 ENCOUNTER — Other Ambulatory Visit: Payer: Self-pay

## 2021-09-12 ENCOUNTER — Ambulatory Visit (HOSPITAL_COMMUNITY)
Admission: EM | Admit: 2021-09-12 | Discharge: 2021-09-12 | Disposition: A | Discharge status: Home / Self Care | Payer: Medicare Other | Attending: Internal Medicine | Admitting: Internal Medicine

## 2021-09-12 DIAGNOSIS — E1165 Type 2 diabetes mellitus with hyperglycemia: Secondary | ICD-10-CM

## 2021-09-12 DIAGNOSIS — R739 Hyperglycemia, unspecified: Secondary | ICD-10-CM | POA: Diagnosis not present

## 2021-09-12 DIAGNOSIS — Z794 Long term (current) use of insulin: Secondary | ICD-10-CM

## 2021-09-12 LAB — POCT URINALYSIS DIPSTICK, ED / UC
Glucose, UA: >=1000 mg/dL — AB
Leukocytes,Ua: NEGATIVE
Nitrite: NEGATIVE
Protein, ur: 100 mg/dL — AB
Specific Gravity, Urine: 1.02 (ref 1.005–1.030)
Urobilinogen, UA: 0.2 mg/dL (ref 0.0–1.0)
pH: 5 (ref 5.0–8.0)

## 2021-09-12 LAB — BASIC METABOLIC PANEL
Anion gap: 15 (ref 5–15)
BUN: 63 mg/dL — ABNORMAL HIGH (ref 8–23)
CO2: 19 mmol/L — ABNORMAL LOW (ref 22–32)
Calcium: 9.5 mg/dL (ref 8.9–10.3)
Chloride: 104 mmol/L (ref 98–111)
Creatinine, Ser: 5.03 mg/dL — ABNORMAL HIGH (ref 0.61–1.24)
GFR, Estimated: 12 mL/min — ABNORMAL LOW (ref >60)
Glucose, Bld: 413 mg/dL — ABNORMAL HIGH (ref 70–99)
Potassium: 4.4 mmol/L (ref 3.5–5.1)
Sodium: 138 mmol/L (ref 135–145)

## 2021-09-12 LAB — CBG MONITORING, ED: Glucose-Capillary: 380 mg/dL — ABNORMAL HIGH (ref 70–99)

## 2021-09-12 NOTE — Discharge Instructions (Signed)
We did blood work today but will not have these results until tomorrow.  I recommend calling your endocrinologist to follow-up on the blood work that you had yesterday and see if you need to change anything with your management.  Continue taking your insulin.  If you significantly worsen, especially if your blood sugars continue to climb, you are feeling very ill, fatigued, confused, having abdominal pain and vomiting, you should be seen at the emergency room right away.

## 2021-09-12 NOTE — ED Triage Notes (Signed)
Pt reports hyperglycemia x 6 days. Pt states glucose has been decreasing over the past 6 days but was hoping he could get a "diabetic drip".

## 2021-09-12 NOTE — ED Provider Notes (Signed)
[place]    CSN: [id number] Arrival date & time: 09/12/21  7026      History   Chief Complaint Chief Complaint  Patient presents with   Hyperglycemia    HPI Jeffrey Holmes is a 71 y.o. male.   Hyperglyemcia in setting of DM Takes insulin regularly Last week started to notice elevations in sugars without any other change Had some dry mouth and fatigue last week Sugars have been improving slowly Saw his endocrinologist yesterday and had blood work, is waiting on the results Endocrinologist also increase his insulin regimen States that this morning when he woke up his sugar was in the 200s He is wondering if he needs a "diabetes drip" to get this under better control States that he still feels slightly fatigued, but otherwise feeling much improved He denies any abdominal pain, vomiting States that he is urinating normally   Past Medical History:  Diagnosis Date   Allergy    Benign essential HTN 08/08/2015   CAD (coronary artery disease), native coronary artery 08/28/2015   Severe multivessel ASCAD s/p CABG hybrid with LIMA to LAD and diag and PCI of the RCA.    Chronic back pain    L5-S1   Chronic kidney disease, stage 3 (HCC)    multiple kidney stones   Diabetes mellitus without complication (HCC)    TYPE 2   GERD (gastroesophageal reflux disease)    OCC   H/O hiatal hernia    Hepatic cyst 11/01/2016   History of acute renal failure 2015   secondary to stones, ELEVATED CREATININE, NO DIALYSIS DONE   History of kidney stones    Hx of adenomatous colonic polyps 06/11/2017   Hyperlipidemia 08/08/2015   NAFLD (nonalcoholic fatty liver disease) 11/01/2016   PONV (postoperative nausea and vomiting)    hx of years ago    Splenic cyst 11/01/2016   Ureteral calculus 01/06/2014    Patient Active Problem List   Diagnosis Date Noted   Kidney stone on right side 12/13/2020   AKI (acute kidney injury) ([place]) 11/17/2020   Uncontrolled type 2 diabetes mellitus with  hyperglycemia ([place]) 04/05/2020   Hx of adenomatous colonic polyps 06/11/2017   Ureteral stone 03/12/2017   Diabetes ([place]) 11/11/2016   Hepatic cyst 11/01/2016   NAFLD (nonalcoholic fatty liver disease) 11/01/2016   Splenic cyst 11/01/2016   CAD (coronary artery disease), native coronary artery 11/03/2015   Abnormal result of cardiovascular function study 08/16/2015   Chronic renal insufficiency, stage III (moderate) ([place]) 08/16/2015   Benign essential HTN 08/08/2015   Hyperlipidemia 08/08/2015    Past Surgical History:  Procedure Laterality Date   ANGIOPLASTY N/A 08/21/2015   Procedure: ANGIOPLASTY WITH PERCUTANEOUS CORONARY INTERVENTION WITH DES TO RIGHT CORONARY ARTERY;  Surgeon: Jettie Booze, MD;  Location: [facility];  Service: Cardiovascular;  Laterality: N/A;   BACK SURGERY  2006   microdisectomy L5-S1   CARDIAC CATHETERIZATION N/A 08/18/2015   Procedure: Left Heart Cath and Coronary Angiography;  Surgeon: Jettie Booze, MD;  Location: [facility];  Service: Cardiovascular;  Laterality: N/A;   CARDIAC CATHETERIZATION N/A 08/21/2015   Procedure: Coronary Stent Intervention;  Surgeon: Jettie Booze, MD;  Location: [facility];  Service: Cardiovascular;  Laterality: N/A;   CARDIAC CATHETERIZATION  08/21/2015   Procedure: Bypass Graft Angiography;  Surgeon: Jettie Booze, MD;  Location: [facility];  Service: Cardiovascular;;   CARDIOVASCULAR STRESS TEST  08/15/2015   cataract surgery  COLONOSCOPY     CORONARY ARTERY BYPASS GRAFT N/A 08/21/2015   Procedure: OFF PUMP CORONARY ARTERY BYPASS GRAFTING (CABG), TIMES TWO, USING LEFT INTERNAL MAMMARY ARTERY, RIGHT GREATER SAPHENOUS VEIN HARVESTED ENDOSCOPICALLY;  Surgeon: [name], MD;  Location: [place] [facility];  Service: Open Heart Surgery;  Laterality: N/A;   CYSTOSCOPY W/ URETERAL STENT PLACEMENT Right 03/12/2017   Procedure: CYSTOSCOPY WITH RETROGRADE PYELOGRAM/URETERAL STENT PLACEMENT;  Surgeon:  Bjorn Loser, MD;  [facility];  Service: Urology;  Laterality: Right;   CYSTOSCOPY W/ URETERAL STENT PLACEMENT Right 12/13/2020   Procedure: CYSTOSCOPY WITH RETROGRADE PYELOGRAM/URETERAL STENT PLACEMENT;  Surgeon: Franchot Gallo, MD;  [facility];  Service: Urology;  Laterality: Right;   CYSTOSCOPY WITH RETROGRADE PYELOGRAM, URETEROSCOPY AND STENT PLACEMENT Right 01/06/2014   Procedure: CYSTOSCOPY WITH RETROGRADE PYELOGRAM, URETEROSCOPY AND STENT PLACEMENT;  Surgeon: Bernestine Amass, MD;  [facility];  Service: Urology;  Laterality: Right;   CYSTOSCOPY/URETEROSCOPY/HOLMIUM LASER/STENT PLACEMENT Right 03/25/2017   Procedure: RIGHT URETEROSCOPY WITH HOLMIUM LASER STENT EXCHANGE;  Surgeon: [name], MD;  [facility];  Service: Urology;  Laterality: Right;   CYSTOSCOPY/URETEROSCOPY/HOLMIUM LASER/STENT PLACEMENT Bilateral 11/17/2020   Procedure: CYSTOSCOPY/URETEROSCOPY/HOLMIUM LASER/STENT PLACEMENT;  Surgeon: Lucas Mallow, MD;  [facility];  Service: Urology;  Laterality: Bilateral;   CYSTOSCOPY/URETEROSCOPY/HOLMIUM LASER/STENT PLACEMENT Bilateral 12/05/2020   Procedure: CYSTOSCOPY BILATERAL /URETEROSCOPY/HOLMIUM LASER/STENT PLACEMENT;  Surgeon: [name], MD;  [facility];  Service: Urology;  Laterality: Bilateral;   ESOPHAGOGASTRODUODENOSCOPY ENDOSCOPY     normal   LITHOTRIPSY     2-3 times in past   TEE WITHOUT CARDIOVERSION N/A 08/21/2015   Procedure: TRANSESOPHAGEAL ECHOCARDIOGRAM (TEE);  Surgeon: [name], MD;  Location: [place] [facility];  Service: Open Heart Surgery;  Laterality: N/A;   TONSILLECTOMY     URETEROSCOPY WITH HOLMIUM LASER LITHOTRIPSY Right 12/13/2020   Procedure: URETEROSCOPY WITH HOLMIUM LASER LITHOTRIPSY;  Surgeon: Franchot Gallo, MD;  [facility];  Service: Urology;  Laterality: Right;   WISDOM TOOTH EXTRACTION         Home Medications    Prior to Admission medications   Medication Sig Start Date End Date Taking?  Authorizing Provider  amLODipine (NORVASC) 10 MG tablet Take 1 tablet (10 mg total) by mouth daily. 08/10/21   Sueanne Margarita, MD  atorvastatin (LIPITOR) 80 MG tablet Take 1 tablet (80 mg total) by mouth daily. 05/08/21   Sueanne Margarita, MD  clopidogrel (PLAVIX) 75 MG tablet Take 1 tablet by mouth once daily 03/30/21   Turner, Eber Hong, MD  Evolocumab (REPATHA SURECLICK) 397 MG/ML SOAJ Inject 1 pen into the skin every 14 (fourteen) days. 05/08/21   Sueanne Margarita, MD  Insulin Glargine (BASAGLAR KWIKPEN) 100 UNIT/ML SOPN Inject 0.2 mLs (20 Units total) into the skin at bedtime. Patient taking differently: Inject 24 Units into the skin at bedtime. 10/10/16   Renato Shin, MD  insulin lispro (HUMALOG) 100 UNIT/ML injection Inject 10-20 Units into the skin daily as needed for high blood sugar. With meals as needed  15-20 units with meals as needed  > 150= 20 units  ? 100= 15 units    [provider]  latanoprost (XALATAN) 0.005 % ophthalmic solution Place 1 drop into both eyes at bedtime. 10/28/20   [provider]  lisinopril (ZESTRIL) 20 MG tablet Take 20 mg by mouth daily. 10/19/20   [provider]  loratadine (CLARITIN) 10 MG tablet Take 10 mg by mouth daily as needed for allergies. 09/19/18  [provider]  metoprolol succinate (TOPROL-XL) 50 MG 24 hr tablet Take 1 tablet (50 mg total) by mouth 2 (two) times daily. Take with or immediately following a meal. 05/08/21   Turner, Eber Hong, MD  montelukast (SINGULAIR) 10 MG tablet Take 10 mg by mouth at bedtime. 12/10/18   [provider]  nitroGLYCERIN (NITROSTAT) 0.4 MG SL tablet DISSOLVE ONE TABLET UNDER THE TONGUE EVERY 5 MINUTES AS NEEDED FOR CHEST PAIN.  DO NOT EXCEED A TOTAL OF 3 DOSES IN 15 MINUTES NOW Patient taking differently: Place 0.4 mg under the tongue every 5 (five) minutes as needed for chest pain. 12/01/19   Sueanne Margarita, MD  ondansetron (ZOFRAN) 4 MG tablet Take 4 mg by mouth every 4 (four)  hours as needed for nausea or vomiting. 11/13/20   [provider]  pantoprazole (PROTONIX) 40 MG tablet Take 1 tablet (40 mg total) by mouth daily before breakfast. Patient taking differently: Take 40 mg by mouth daily as needed (indigestion). 05/18/19   Gatha Mayer, MD  tadalafil (CIALIS) 10 MG tablet Take 10 mg by mouth daily as needed for erectile dysfunction. 11/09/20   [provider]  timolol (TIMOPTIC) 0.5 % ophthalmic solution Place 1 drop into both eyes 2 (two) times daily.  07/19/15   [provider]  traMADol (ULTRAM) 50 MG tablet Take 50 mg by mouth every 6 (six) hours as needed for moderate pain. 11/13/20   [provider]    Family History Family History  Problem Relation Age of Onset   Kidney failure Mother    Heart attack Mother        angina    Diabetes Mother    Throat cancer Father    Diabetes Sister    Colon cancer Neg Hx    Colon polyps Neg Hx    Rectal cancer Neg Hx    Stomach cancer Neg Hx     Social History Social History   Tobacco Use   Smoking status: Never   Smokeless tobacco: Never  Vaping Use   Vaping Use: Never used  Substance Use Topics   Alcohol use: Never   Drug use: No     Allergies   Vicodin [hydrocodone-acetaminophen], Hydrocodone, Oxycontin [oxycodone hcl], and Percocet [oxycodone-acetaminophen]   Review of Systems Review of Systems  All other systems reviewed and are negative.  Per HPI Physical Exam Triage Vital Signs ED Triage Vitals [09/12/21 0953]  Enc Vitals Group     BP      Pulse Rate 85     Resp 16     Temp 98.2 F (36.8 C)     Temp Source Oral     SpO2 96 %     Weight 188 lb 0.8 oz (85.3 kg)     Height 5\' 5"  (1.651 m)     Head Circumference      Peak Flow      Pain Score 0     Pain Loc      Pain Edu?      Excl. in [place]?    No data found.  Updated Vital Signs BP 101/68 (BP Location: Left Arm)    Pulse 85    Temp 98.2 F (36.8 C) (Oral)    Resp 16    Ht 5\' 5"  (1.651 m)     Wt 188 lb 0.8 oz (85.3 kg)    SpO2 96%    BMI 31.29 kg/m   Visual Acuity Right Eye Distance:  Left Eye Distance:   Bilateral Distance:    Right Eye Near:   Left Eye Near:    Bilateral Near:     Physical Exam Constitutional:      General: He is not in acute distress.    Appearance: Normal appearance. He is not ill-appearing.  HENT:     Head: Normocephalic and atraumatic.     Mouth/Throat:     Mouth: Mucous membranes are moist.  Eyes:     Conjunctiva/sclera: Conjunctivae normal.  Cardiovascular:     Rate and Rhythm: Normal rate and regular rhythm.     Heart sounds: No murmur heard.   No friction rub.  Pulmonary:     Effort: Pulmonary effort is normal. No respiratory distress.     Breath sounds: Normal breath sounds.  Abdominal:     General: There is no distension.  Musculoskeletal:     Cervical back: Neck supple.  Skin:    General: Skin is warm and dry.     Capillary Refill: Capillary refill takes less than 2 seconds.  Neurological:     Mental Status: He is alert and oriented to person, place, and time.  Psychiatric:        Mood and Affect: Mood normal.        Behavior: Behavior normal.     UC Treatments / Results  Labs (all labs ordered are listed, but only abnormal results are displayed) Labs Reviewed  CBG MONITORING, ED - Abnormal; Notable for the following components:      Result Value   Glucose-Capillary 380 (*)    All other components within normal limits  POCT URINALYSIS DIPSTICK, ED / UC - Abnormal; Notable for the following components:   Glucose, UA >=1000 (*)    Bilirubin Urine SMALL (*)    Ketones, ur TRACE (*)    Hgb urine dipstick TRACE (*)    Protein, ur 100 (*)    All other components within normal limits  URINALYSIS, DIPSTICK ONLY  BASIC METABOLIC PANEL    EKG   Radiology No results found.  Procedures Procedures (including critical care time)  Medications Ordered in UC Medications - No data to display  Initial Impression /  Assessment and Plan / UC Course  I have reviewed the triage vital signs and the nursing notes.  Pertinent labs & imaging results that were available during my care of the patient were reviewed by me and considered in my medical decision making (see chart for details).     Patient with stable vital signs.  He has been having hyperglycemia, is only on insulin.  He is actively being followed by his endocrinologist and is waiting on a call today in regards to his blood work from yesterday.  This is unable to be seen in the chart.  UA performed showing glucosuria and trace ketones.  His glucose today is 380.  On exam very unlikely that he has DKA.  BMP performed, but recommended calling his endocrinologist today to follow-up on his blood work.  Discussed with him that he cannot be placed on a insulin drip here at urgent care and if he is feeling sick enough to think he needs this he warrants ED evaluation.  He was discharged home in stable condition. Final Clinical Impressions(s) / UC Diagnoses   Final diagnoses:  Hyperglycemia     Discharge Instructions      We did blood work today but will not have these results until tomorrow.  I recommend calling your endocrinologist to  follow-up on the blood work that you had yesterday and see if you need to change anything with your management.  Continue taking your insulin.  If you significantly worsen, especially if your blood sugars continue to climb, you are feeling very ill, fatigued, confused, having abdominal pain and vomiting, you should be seen at the emergency room right away.     ED Prescriptions   None    PDMP not reviewed this encounter.   MeccarielloBernita Raisin, DO 09/12/21 1030

## 2021-09-13 ENCOUNTER — Emergency Department (HOSPITAL_COMMUNITY)
Admission: EM | Admit: 2021-09-13 | Discharge: 2021-09-13 | Disposition: A | Discharge status: Home / Self Care | Payer: Medicare Other | Attending: Emergency Medicine | Admitting: Emergency Medicine

## 2021-09-13 ENCOUNTER — Other Ambulatory Visit: Payer: Self-pay

## 2021-09-13 DIAGNOSIS — Z5321 Procedure and treatment not carried out due to patient leaving prior to being seen by health care provider: Secondary | ICD-10-CM | POA: Insufficient documentation

## 2021-09-13 DIAGNOSIS — E162 Hypoglycemia, unspecified: Secondary | ICD-10-CM | POA: Diagnosis not present

## 2021-09-13 LAB — CBC
HCT: 51.9 % (ref 39.0–52.0)
Hemoglobin: 17.3 g/dL — ABNORMAL HIGH (ref 13.0–17.0)
MCH: 30.6 pg (ref 26.0–34.0)
MCHC: 33.3 g/dL (ref 30.0–36.0)
MCV: 91.7 fL (ref 80.0–100.0)
Platelets: 176 10*3/uL (ref 150–400)
RBC: 5.66 MIL/uL (ref 4.22–5.81)
RDW: 12.5 % (ref 11.5–15.5)
WBC: 9.8 10*3/uL (ref 4.0–10.5)
nRBC: 0 % (ref 0.0–0.2)

## 2021-09-13 LAB — BASIC METABOLIC PANEL
Anion gap: 12 (ref 5–15)
BUN: 79 mg/dL — ABNORMAL HIGH (ref 8–23)
CO2: 23 mmol/L (ref 22–32)
Calcium: 8.7 mg/dL — ABNORMAL LOW (ref 8.9–10.3)
Chloride: 101 mmol/L (ref 98–111)
Creatinine, Ser: 5.21 mg/dL — ABNORMAL HIGH (ref 0.61–1.24)
GFR, Estimated: 11 mL/min — ABNORMAL LOW (ref >60)
Glucose, Bld: 508 mg/dL (ref 70–99)
Potassium: 3.6 mmol/L (ref 3.5–5.1)
Sodium: 136 mmol/L (ref 135–145)

## 2021-09-13 LAB — URINALYSIS, ROUTINE W REFLEX MICROSCOPIC
Bilirubin Urine: NEGATIVE
Glucose, UA: >=500 mg/dL — AB
Hgb urine dipstick: NEGATIVE
Ketones, ur: NEGATIVE mg/dL
Nitrite: NEGATIVE
Protein, ur: 30 mg/dL — AB
Specific Gravity, Urine: 1.014 (ref 1.005–1.030)
pH: 5 (ref 5.0–8.0)

## 2021-09-13 LAB — CBG MONITORING, ED: Glucose-Capillary: 446 mg/dL — ABNORMAL HIGH (ref 70–99)

## 2021-09-13 NOTE — ED Notes (Signed)
PT was called for vitals at 14:11 PT di not answer.  PT was called  for vitals at 13:45 PT did not answer

## 2021-09-13 NOTE — ED Notes (Signed)
PT was called fo vitals 14:20. Removing from the floor.

## 2021-09-13 NOTE — ED Triage Notes (Signed)
Pt. Stated, Jeffrey Holmes had problems with my sugar going down, I went to see my Dr. At [facility] and he increased my Insulin but it doesn't seem to be coming down.

## 2021-09-20 DIAGNOSIS — N1832 Chronic kidney disease, stage 3b: Secondary | ICD-10-CM | POA: Diagnosis not present

## 2021-10-09 DIAGNOSIS — I2510 Atherosclerotic heart disease of native coronary artery without angina pectoris: Secondary | ICD-10-CM | POA: Diagnosis not present

## 2021-10-09 DIAGNOSIS — E1151 Type 2 diabetes mellitus with diabetic peripheral angiopathy without gangrene: Secondary | ICD-10-CM | POA: Diagnosis not present

## 2021-10-09 DIAGNOSIS — N1832 Chronic kidney disease, stage 3b: Secondary | ICD-10-CM | POA: Diagnosis not present

## 2021-10-09 DIAGNOSIS — I10 Essential (primary) hypertension: Secondary | ICD-10-CM | POA: Diagnosis not present

## 2021-10-09 DIAGNOSIS — E039 Hypothyroidism, unspecified: Secondary | ICD-10-CM | POA: Diagnosis not present

## 2021-10-09 DIAGNOSIS — Z794 Long term (current) use of insulin: Secondary | ICD-10-CM | POA: Diagnosis not present

## 2021-10-09 DIAGNOSIS — E785 Hyperlipidemia, unspecified: Secondary | ICD-10-CM | POA: Diagnosis not present

## 2021-10-09 DIAGNOSIS — Z8349 Family history of other endocrine, nutritional and metabolic diseases: Secondary | ICD-10-CM | POA: Diagnosis not present

## 2021-11-20 ENCOUNTER — Encounter (HOSPITAL_COMMUNITY): Payer: Self-pay

## 2021-11-20 ENCOUNTER — Ambulatory Visit (HOSPITAL_COMMUNITY)
Admission: EM | Admit: 2021-11-20 | Discharge: 2021-11-20 | Disposition: A | Discharge status: Home / Self Care | Payer: Medicare Other | Attending: Family Medicine | Admitting: Family Medicine

## 2021-11-20 DIAGNOSIS — U071 COVID-19: Secondary | ICD-10-CM

## 2021-11-20 MED ORDER — MOLNUPIRAVIR EUA 200MG CAPSULE: 4.0000 | ORAL_CAPSULE | Freq: Two times a day (BID) | ORAL | 0 refills | Status: AC

## 2021-11-20 MED ORDER — ONDANSETRON 4 MG PO TBDP: 4.0000 mg | ORAL_TABLET | Freq: Three times a day (TID) | ORAL | 0 refills | Status: DC | PRN

## 2021-11-20 MED ORDER — BENZONATATE 100 MG PO CAPS: 100.0000 mg | ORAL_CAPSULE | Freq: Three times a day (TID) | ORAL | 0 refills | Status: DC | PRN

## 2021-11-20 NOTE — Discharge Instructions (Addendum)
Molnupiravir 200 mg--take 4 capsules 2 times daily for 5 days.  This is your antiviral medication for the COVID infection ? ?Take benzonatate 100 mg--1 capsule 3 times daily as needed for cough ? ? ?Ondansetron dissolved in the mouth every 8 hours as needed for nausea or vomiting. ? ?

## 2021-11-20 NOTE — ED Triage Notes (Signed)
Pt tested positive for covid this morning and complains of non productive cough & congestion. ?

## 2021-11-20 NOTE — ED Provider Notes (Signed)
?[facility] ? ? ? ?CSN: [id number] ?Arrival date & time: 11/20/21  1735 ? ? ?  ? ?History   ?Chief Complaint ?Chief Complaint  ?Patient presents with  ? Covid +  ? ? ?HPI ?Jeffrey Holmes is a 71 y.o. male.  ? ?HPI ?Here for a 2-day history of cough and some nasal congestion.  He feels a little irritated in his throat.  No fever or chills.  No shortness of breath.  He had a negative home COVID test yesterday, and then he repeated it today and it was positive. ? ?Past medical history includes diabetes, which she says is now well controlled, coronary artery disease, and stage III kidney disease.  His last creatinine was in the 5 range ? ?Past Medical History:  ?Diagnosis Date  ? Allergy   ? Benign essential HTN 08/08/2015  ? CAD (coronary artery disease), native coronary artery 08/28/2015  ? Severe multivessel ASCAD s/p CABG hybrid with LIMA to LAD and diag and PCI of the RCA.   ? Chronic back pain   ? L5-S1  ? Chronic kidney disease, stage 3 (HCC)   ? multiple kidney stones  ? Diabetes mellitus without complication ([place])   ? TYPE 2  ? GERD (gastroesophageal reflux disease)   ? OCC  ? H/O hiatal hernia   ? Hepatic cyst 11/01/2016  ? History of acute renal failure 2015  ? secondary to stones, ELEVATED CREATININE, NO DIALYSIS DONE  ? History of kidney stones   ? Hx of adenomatous colonic polyps 06/11/2017  ? Hyperlipidemia 08/08/2015  ? NAFLD (nonalcoholic fatty liver disease) 11/01/2016  ? PONV (postoperative nausea and vomiting)   ? hx of years ago   ? Splenic cyst 11/01/2016  ? Ureteral calculus 01/06/2014  ? ? ?Patient Active Problem List  ? Diagnosis Date Noted  ? Kidney stone on right side 12/13/2020  ? AKI (acute kidney injury) ([place]) 11/17/2020  ? Uncontrolled type 2 diabetes mellitus with hyperglycemia ([place]) 04/05/2020  ? Hx of adenomatous colonic polyps 06/11/2017  ? Ureteral stone 03/12/2017  ? Diabetes ([place]) 11/11/2016  ? Hepatic cyst 11/01/2016  ? NAFLD (nonalcoholic fatty liver disease) 11/01/2016  ? Splenic cyst  11/01/2016  ? CAD (coronary artery disease), native coronary artery 11/03/2015  ? Abnormal result of cardiovascular function study 08/16/2015  ? Chronic renal insufficiency, stage III (moderate) ([place]) 08/16/2015  ? Benign essential HTN 08/08/2015  ? Hyperlipidemia 08/08/2015  ? ? ?Past Surgical History:  ?Procedure Laterality Date  ? ANGIOPLASTY N/A 08/21/2015  ? Procedure: ANGIOPLASTY WITH PERCUTANEOUS CORONARY INTERVENTION WITH DES TO RIGHT CORONARY ARTERY;  Surgeon: Jettie Booze, MD;  Location: [facility];  Service: Cardiovascular;  Laterality: N/A;  ? BACK SURGERY  2006  ? microdisectomy L5-S1  ? CARDIAC CATHETERIZATION N/A 08/18/2015  ? Procedure: Left Heart Cath and Coronary Angiography;  Surgeon: Jettie Booze, MD;  Location: [facility];  Service: Cardiovascular;  Laterality: N/A;  ? CARDIAC CATHETERIZATION N/A 08/21/2015  ? Procedure: Coronary Stent Intervention;  Surgeon: Jettie Booze, MD;  Location: [facility];  Service: Cardiovascular;  Laterality: N/A;  ? CARDIAC CATHETERIZATION  08/21/2015  ? Procedure: Bypass Graft Angiography;  Surgeon: Jettie Booze, MD;  Location: [facility];  Service: Cardiovascular;;  ? CARDIOVASCULAR STRESS TEST  08/15/2015  ? cataract surgery     ? COLONOSCOPY    ? CORONARY ARTERY BYPASS GRAFT N/A 08/21/2015  ? Procedure: OFF PUMP CORONARY ARTERY BYPASS GRAFTING (CABG), TIMES  TWO, USING LEFT INTERNAL MAMMARY ARTERY, RIGHT GREATER SAPHENOUS VEIN HARVESTED ENDOSCOPICALLY;  Surgeon: [name], MD;  Location: [facility];  Service: Open Heart Surgery;  Laterality: N/A;  ? CYSTOSCOPY W/ URETERAL STENT PLACEMENT Right 03/12/2017  ? Procedure: CYSTOSCOPY WITH RETROGRADE PYELOGRAM/URETERAL STENT PLACEMENT;  Surgeon: Bjorn Loser, MD;  [facility];  Service: Urology;  Laterality: Right;  ? CYSTOSCOPY W/ URETERAL STENT PLACEMENT Right 12/13/2020  ? Procedure: CYSTOSCOPY WITH RETROGRADE PYELOGRAM/URETERAL STENT PLACEMENT;  Surgeon:  Franchot Gallo, MD;  [facility];  Service: Urology;  Laterality: Right;  ? CYSTOSCOPY WITH RETROGRADE PYELOGRAM, URETEROSCOPY AND STENT PLACEMENT Right 01/06/2014  ? Procedure: CYSTOSCOPY WITH RETROGRADE PYELOGRAM, URETEROSCOPY AND STENT PLACEMENT;  Surgeon: Bernestine Amass, MD;  [facility];  Service: Urology;  Laterality: Right;  ? CYSTOSCOPY/URETEROSCOPY/HOLMIUM LASER/STENT PLACEMENT Right 03/25/2017  ? Procedure: RIGHT URETEROSCOPY WITH HOLMIUM LASER STENT EXCHANGE;  Surgeon: [name], MD;  [facility];  Service: Urology;  Laterality: Right;  ? CYSTOSCOPY/URETEROSCOPY/HOLMIUM LASER/STENT PLACEMENT Bilateral 11/17/2020  ? Procedure: CYSTOSCOPY/URETEROSCOPY/HOLMIUM LASER/STENT PLACEMENT;  Surgeon: Lucas Mallow, MD;  [facility];  Service: Urology;  Laterality: Bilateral;  ? CYSTOSCOPY/URETEROSCOPY/HOLMIUM LASER/STENT PLACEMENT Bilateral 12/05/2020  ? Procedure: CYSTOSCOPY BILATERAL /URETEROSCOPY/HOLMIUM LASER/STENT PLACEMENT;  Surgeon: [name], MD;  [facility];  Service: Urology;  Laterality: Bilateral;  ? ESOPHAGOGASTRODUODENOSCOPY ENDOSCOPY    ? normal  ? LITHOTRIPSY    ? 2-3 times in past  ? TEE WITHOUT CARDIOVERSION N/A 08/21/2015  ? Procedure: TRANSESOPHAGEAL ECHOCARDIOGRAM (TEE);  Surgeon: [name], MD;  Location: [facility];  Service: Open Heart Surgery;  Laterality: N/A;  ? TONSILLECTOMY    ? URETEROSCOPY WITH HOLMIUM LASER LITHOTRIPSY Right 12/13/2020  ? Procedure: URETEROSCOPY WITH HOLMIUM LASER LITHOTRIPSY;  Surgeon: Franchot Gallo, MD;  [facility];  Service: Urology;  Laterality: Right;  ? WISDOM TOOTH EXTRACTION    ? ? ? ? ? ?Home Medications   ? ?Prior to Admission medications   ?Medication Sig Start Date End Date Taking? Authorizing Provider  ?benzonatate (TESSALON) 100 MG capsule Take 1 capsule (100 mg total) by mouth 3 (three) times daily as needed for cough. 11/20/21  Yes Barrett Henle, MD  ?molnupiravir EUA (LAGEVRIO) 200 mg CAPS capsule  Take 4 capsules (800 mg total) by mouth 2 (two) times daily for 5 days. 11/20/21 11/25/21 Yes Barrett Henle, MD  ?ondansetron (ZOFRAN-ODT) 4 MG disintegrating tablet Take 1 tablet (4 mg total) by mouth every 8 (eight) hours as needed for nausea or vomiting. 11/20/21  Yes Barrett Henle, MD  ?amLODipine (NORVASC) 10 MG tablet Take 1 tablet (10 mg total) by mouth daily. 08/10/21   Sueanne Margarita, MD  ?atorvastatin (LIPITOR) 80 MG tablet Take 1 tablet (80 mg total) by mouth daily. 05/08/21   Sueanne Margarita, MD  ?clopidogrel (PLAVIX) 75 MG tablet Take 1 tablet by mouth once daily 03/30/21   Sueanne Margarita, MD  ?Evolocumab (REPATHA SURECLICK) 193 MG/ML SOAJ Inject 1 pen into the skin every 14 (fourteen) days. 05/08/21   Sueanne Margarita, MD  ?Insulin Glargine (BASAGLAR KWIKPEN) 100 UNIT/ML SOPN Inject 0.2 mLs (20 Units total) into the skin at bedtime. ?Patient taking differently: Inject 24 Units into the skin at bedtime. 10/10/16   Renato Shin, MD  ?insulin lispro (HUMALOG) 100 UNIT/ML injection Inject 10-20 Units into the skin daily as needed for high blood sugar. With meals as needed  ?15-20 units with meals as needed  ?> 150= 20  units  ?? 100= 15 units    [provider]  ?latanoprost (XALATAN) 0.005 % ophthalmic solution Place 1 drop into both eyes at bedtime. 10/28/20   [provider]  ?lisinopril (ZESTRIL) 20 MG tablet Take 20 mg by mouth daily. 10/19/20   [provider]  ?loratadine (CLARITIN) 10 MG tablet Take 10 mg by mouth daily as needed for allergies. 09/19/18   [provider]  ?metoprolol succinate (TOPROL-XL) 50 MG 24 hr tablet Take 1 tablet (50 mg total) by mouth 2 (two) times daily. Take with or immediately following a meal. 05/08/21   Turner, Eber Hong, MD  ?montelukast (SINGULAIR) 10 MG tablet Take 10 mg by mouth at bedtime. 12/10/18   [provider]  ?nitroGLYCERIN (NITROSTAT) 0.4 MG SL tablet DISSOLVE ONE TABLET UNDER THE TONGUE EVERY 5 MINUTES AS NEEDED  FOR CHEST PAIN.  DO NOT EXCEED A TOTAL OF 3 DOSES IN 15 MINUTES NOW ?Patient taking differently: Place 0.4 mg under the tongue every 5 (five) minutes as needed for chest pain. 12/01/19   Sueanne Margarita, MD  ?pa

## 2021-11-29 ENCOUNTER — Ambulatory Visit
Admission: RE | Admit: 2021-11-29 | Discharge: 2021-11-29 | Disposition: A | Discharge status: Home / Self Care | Payer: Medicare Other | Source: Ambulatory Visit | Attending: Physician Assistant | Admitting: Physician Assistant

## 2021-11-29 VITALS — BP 163/87 | HR 68 | Temp 98.4°F | Resp 18

## 2021-11-29 DIAGNOSIS — R0981 Nasal congestion: Secondary | ICD-10-CM

## 2021-11-29 DIAGNOSIS — R0989 Other specified symptoms and signs involving the circulatory and respiratory systems: Secondary | ICD-10-CM | POA: Diagnosis not present

## 2021-11-29 DIAGNOSIS — U071 COVID-19: Secondary | ICD-10-CM

## 2021-11-29 NOTE — ED Provider Notes (Signed)
?[place] ? ? ? ?CSN: [id number] ?Arrival date & time: 11/29/21  0847 ? ? ?  ? ?History   ?Chief Complaint ?Chief Complaint  ?Patient presents with  ? Cough  ?  Re-test-covid 19 - Entered by patient  ? Covid Positive  ? ? ?HPI ?Jeffrey Holmes is a 71 y.o. male.  ? ?71 year old male presents with COVID symptoms.  Patient relates that he and his wife probably picked up COVID from his breath.  Patient relates he is currently feeling well he is without fever, chills, minimal congestion with cough.  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  Patient does have some mild postnasal drip, without sore throat. Patient relates he has not had any wheezing, or shortness of breath.  Patient relates that he is on day 10 of COVID isolation as recommended by CDC.  Patient requested a repeat COVID test before he removes his mask. ? ? ?Cough ? ?Past Medical History:  ?Diagnosis Date  ? Allergy   ? Benign essential HTN 08/08/2015  ? CAD (coronary artery disease), native coronary artery 08/28/2015  ? Severe multivessel ASCAD s/p CABG hybrid with LIMA to LAD and diag and PCI of the RCA.   ? Chronic back pain   ? L5-S1  ? Chronic kidney disease, stage 3 (HCC)   ? multiple kidney stones  ? Diabetes mellitus without complication ([place])   ? TYPE 2  ? GERD (gastroesophageal reflux disease)   ? OCC  ? H/O hiatal hernia   ? Hepatic cyst 11/01/2016  ? History of acute renal failure 2015  ? secondary to stones, ELEVATED CREATININE, NO DIALYSIS DONE  ? History of kidney stones   ? Hx of adenomatous colonic polyps 06/11/2017  ? Hyperlipidemia 08/08/2015  ? NAFLD (nonalcoholic fatty liver disease) 11/01/2016  ? PONV (postoperative nausea and vomiting)   ? hx of years ago   ? Splenic cyst 11/01/2016  ? Ureteral calculus 01/06/2014  ? ? ?Patient Active Problem List  ? Diagnosis Date Noted  ? Kidney stone on right side 12/13/2020  ? AKI (acute kidney injury) ([place]) 11/17/2020  ? Uncontrolled type 2  diabetes mellitus with hyperglycemia ([place]) 04/05/2020  ? Hx of adenomatous colonic polyps 06/11/2017  ? Ureteral stone 03/12/2017  ? Diabetes ([place]) 11/11/2016  ? Hepatic cyst 11/01/2016  ? NAFLD (nonalcoholic fatty liver disease) 11/01/2016  ? Splenic cyst 11/01/2016  ? CAD (coronary artery disease), native coronary artery 11/03/2015  ? Abnormal result of cardiovascular function study 08/16/2015  ? Chronic renal insufficiency, stage III (moderate) ([place]) 08/16/2015  ? Benign essential HTN 08/08/2015  ? Hyperlipidemia 08/08/2015  ? ? ?Past Surgical History:  ?Procedure Laterality Date  ? ANGIOPLASTY N/A 08/21/2015  ? Procedure: ANGIOPLASTY WITH PERCUTANEOUS CORONARY INTERVENTION WITH DES TO RIGHT CORONARY ARTERY;  Surgeon: Jettie Booze, MD;  Location: [facility];  Service: Cardiovascular;  Laterality: N/A;  ? BACK SURGERY  2006  ? microdisectomy L5-S1  ? CARDIAC CATHETERIZATION N/A 08/18/2015  ? Procedure: Left Heart Cath and Coronary Angiography;  Surgeon: Jettie Booze, MD;  Location: [facility];  Service: Cardiovascular;  Laterality: N/A;  ? CARDIAC CATHETERIZATION N/A 08/21/2015  ? Procedure: Coronary Stent Intervention;  Surgeon: Jettie Booze, MD;  Location: [facility];  Service: Cardiovascular;  Laterality: N/A;  ? CARDIAC CATHETERIZATION  08/21/2015  ? Procedure: Bypass Graft Angiography;  Surgeon: Jettie Booze, MD;  Location: [facility];  Service:  Cardiovascular;;  ? CARDIOVASCULAR STRESS TEST  08/15/2015  ? cataract surgery     ? COLONOSCOPY    ? CORONARY ARTERY BYPASS GRAFT N/A 08/21/2015  ? Procedure: OFF PUMP CORONARY ARTERY BYPASS GRAFTING (CABG), TIMES TWO, USING LEFT INTERNAL MAMMARY ARTERY, RIGHT GREATER SAPHENOUS VEIN HARVESTED ENDOSCOPICALLY;  Surgeon: [name], MD;  Location: [facility];  Service: Open Heart Surgery;  Laterality: N/A;  ? CYSTOSCOPY W/ URETERAL STENT PLACEMENT Right 03/12/2017  ? Procedure: CYSTOSCOPY WITH RETROGRADE PYELOGRAM/URETERAL  STENT PLACEMENT;  Surgeon: Bjorn Loser, MD;  [facility];  Service: Urology;  Laterality: Right;  ? CYSTOSCOPY W/ URETERAL STENT PLACEMENT Right 12/13/2020  ? Procedure: CYSTOSCOPY WITH RETROGRADE PYELOGRAM/URETERAL STENT PLACEMENT;  Surgeon: Franchot Gallo, MD;  [facility];  Service: Urology;  Laterality: Right;  ? CYSTOSCOPY WITH RETROGRADE PYELOGRAM, URETEROSCOPY AND STENT PLACEMENT Right 01/06/2014  ? Procedure: CYSTOSCOPY WITH RETROGRADE PYELOGRAM, URETEROSCOPY AND STENT PLACEMENT;  Surgeon: Bernestine Amass, MD;  [facility];  Service: Urology;  Laterality: Right;  ? CYSTOSCOPY/URETEROSCOPY/HOLMIUM LASER/STENT PLACEMENT Right 03/25/2017  ? Procedure: RIGHT URETEROSCOPY WITH HOLMIUM LASER STENT EXCHANGE;  Surgeon: [name], MD;  [facility];  Service: Urology;  Laterality: Right;  ? CYSTOSCOPY/URETEROSCOPY/HOLMIUM LASER/STENT PLACEMENT Bilateral 11/17/2020  ? Procedure: CYSTOSCOPY/URETEROSCOPY/HOLMIUM LASER/STENT PLACEMENT;  Surgeon: Lucas Mallow, MD;  [facility];  Service: Urology;  Laterality: Bilateral;  ? CYSTOSCOPY/URETEROSCOPY/HOLMIUM LASER/STENT PLACEMENT Bilateral 12/05/2020  ? Procedure: CYSTOSCOPY BILATERAL /URETEROSCOPY/HOLMIUM LASER/STENT PLACEMENT;  Surgeon: [name], MD;  [facility];  Service: Urology;  Laterality: Bilateral;  ? ESOPHAGOGASTRODUODENOSCOPY ENDOSCOPY    ? normal  ? LITHOTRIPSY    ? 2-3 times in past  ? TEE WITHOUT CARDIOVERSION N/A 08/21/2015  ? Procedure: TRANSESOPHAGEAL ECHOCARDIOGRAM (TEE);  Surgeon: [name], MD;  Location: [facility];  Service: Open Heart Surgery;  Laterality: N/A;  ? TONSILLECTOMY    ? URETEROSCOPY WITH HOLMIUM LASER LITHOTRIPSY Right 12/13/2020  ? Procedure: URETEROSCOPY WITH HOLMIUM LASER LITHOTRIPSY;  Surgeon: Franchot Gallo, MD;  [facility];  Service: Urology;  Laterality: Right;  ? WISDOM TOOTH EXTRACTION    ? ? ? ? ? ?Home Medications   ? ?Prior to Admission medications   ?Medication Sig Start  Date End Date Taking? Authorizing Provider  ?amLODipine (NORVASC) 10 MG tablet Take 1 tablet (10 mg total) by mouth daily. 08/10/21   Sueanne Margarita, MD  ?atorvastatin (LIPITOR) 80 MG tablet Take 1 tablet (80 mg total) by mouth daily. 05/08/21   Sueanne Margarita, MD  ?benzonatate (TESSALON) 100 MG capsule Take 1 capsule (100 mg total) by mouth 3 (three) times daily as needed for cough. 11/20/21   Barrett Henle, MD  ?clopidogrel (PLAVIX) 75 MG tablet Take 1 tablet by mouth once daily 03/30/21   Turner, Eber Hong, MD  ?Evolocumab (REPATHA SURECLICK) 338 MG/ML SOAJ Inject 1 pen into the skin every 14 (fourteen) days. 05/08/21   Sueanne Margarita, MD  ?Insulin Glargine (BASAGLAR KWIKPEN) 100 UNIT/ML SOPN Inject 0.2 mLs (20 Units total) into the skin at bedtime. ?Patient taking differently: Inject 24 Units into the skin at bedtime. 10/10/16   Renato Shin, MD  ?insulin lispro (HUMALOG) 100 UNIT/ML injection Inject 10-20 Units into the skin daily as needed for high blood sugar. With meals as needed  ?15-20 units with meals as needed  ?> 150= 20 units  ?? 100= 15 units    [provider]  ?latanoprost (XALATAN) 0.005 % ophthalmic solution Place 1 drop into  both eyes at bedtime. 10/28/20   [provider]  ?lisinopril (ZESTRIL) 20 MG tablet Take 20 mg by mouth daily. 10/19/20   [provider]  ?loratadine (CLARITIN) 10 MG tablet Take 10 mg by mouth daily as needed for allergies. 09/19/18   [provider]  ?metoprolol succinate (TOPROL-XL) 50 MG 24 hr tablet Take 1 tablet (50 mg total) by mouth 2 (two) times daily. Take with or immediately following a meal. 05/08/21   Turner, Eber Hong, MD  ?montelukast (SINGULAIR) 10 MG tablet Take 10 mg by mouth at bedtime. 12/10/18   [provider]  ?nitroGLYCERIN (NITROSTAT) 0.4 MG SL tablet DISSOLVE ONE TABLET UNDER THE TONGUE EVERY 5 MINUTES AS NEEDED FOR CHEST PAIN.  DO NOT EXCEED A TOTAL OF 3 DOSES IN 15 MINUTES NOW ?Patient taking differently:  Place 0.4 mg under the tongue every 5 (five) minutes as needed for chest pain. 12/01/19   Sueanne Margarita, MD  ?ondansetron (ZOFRAN-ODT) 4 MG disintegrating tablet Take 1 tablet (4 mg total) by mouth every 8 (

## 2021-11-29 NOTE — Discharge Instructions (Signed)
Continue present OTC medications. ?Fluids, observe, and return if symptoms fail to improve. ?

## 2021-11-29 NOTE — ED Triage Notes (Signed)
Pt c/o covid-like sxs, tested COVID(+) last week. States has some "throat congestion," but sxs otherwise resolved. Here for re-test "to make sure everything is ok."  ?

## 2021-11-30 LAB — NOVEL CORONAVIRUS, NAA: SARS-CoV-2, NAA: NOT DETECTED

## 2021-12-17 ENCOUNTER — Ambulatory Visit
Admission: EM | Admit: 2021-12-17 | Discharge: 2021-12-17 | Disposition: A | Discharge status: Home / Self Care | Payer: Medicare Other | Attending: Family Medicine | Admitting: Family Medicine

## 2021-12-17 DIAGNOSIS — J069 Acute upper respiratory infection, unspecified: Secondary | ICD-10-CM | POA: Diagnosis not present

## 2021-12-17 MED ORDER — FLUTICASONE PROPIONATE 50 MCG/ACT NA SUSP: 1.0000 | Freq: Every day | NASAL | 2 refills | Status: DC

## 2021-12-17 MED ORDER — AMOXICILLIN-POT CLAVULANATE 875-125 MG PO TABS: 1.0000 | ORAL_TABLET | Freq: Two times a day (BID) | ORAL | 0 refills | Status: DC

## 2021-12-17 NOTE — ED Provider Notes (Signed)
[facility]    CSN: [id number] Arrival date & time: 12/17/21  0932      History   Chief Complaint Chief Complaint  Patient presents with   URI    HPI Jeffrey Holmes is a 71 y.o. male.   Presenting today with 3-week history of progressively worsening congestion, sinus pressure, hacking productive cough since having COVID-19.  States he took a course of molnupiravir and improved overall with the COVID-19 symptoms 3 weeks ago but the cough and congestion has lingered and worsened despite taking Mucinex, Tessalon Perles and nasal sprays.  Denies fever, chills, chest pain, shortness of breath, abdominal pain, nausea vomiting or diarrhea.  History of similar allergies, no known history of chronic pulmonary disease.   Past Medical History:  Diagnosis Date   Allergy    Benign essential HTN 08/08/2015   CAD (coronary artery disease), native coronary artery 08/28/2015   Severe multivessel ASCAD s/p CABG hybrid with LIMA to LAD and diag and PCI of the RCA.    Chronic back pain    L5-S1   Chronic kidney disease, stage 3 (HCC)    multiple kidney stones   Diabetes mellitus without complication (HCC)    TYPE 2   GERD (gastroesophageal reflux disease)    OCC   H/O hiatal hernia    Hepatic cyst 11/01/2016   History of acute renal failure 2015   secondary to stones, ELEVATED CREATININE, NO DIALYSIS DONE   History of kidney stones    Hx of adenomatous colonic polyps 06/11/2017   Hyperlipidemia 08/08/2015   NAFLD (nonalcoholic fatty liver disease) 11/01/2016   PONV (postoperative nausea and vomiting)    hx of years ago    Splenic cyst 11/01/2016   Ureteral calculus 01/06/2014    Patient Active Problem List   Diagnosis Date Noted   Kidney stone on right side 12/13/2020   AKI (acute kidney injury) ([place]) 11/17/2020   Uncontrolled type 2 diabetes mellitus with hyperglycemia ([place]) 04/05/2020   Hx of adenomatous colonic polyps 06/11/2017   Ureteral stone 03/12/2017   Diabetes ([place])  11/11/2016   Hepatic cyst 11/01/2016   NAFLD (nonalcoholic fatty liver disease) 11/01/2016   Splenic cyst 11/01/2016   CAD (coronary artery disease), native coronary artery 11/03/2015   Abnormal result of cardiovascular function study 08/16/2015   Chronic renal insufficiency, stage III (moderate) ([place]) 08/16/2015   Benign essential HTN 08/08/2015   Hyperlipidemia 08/08/2015    Past Surgical History:  Procedure Laterality Date   ANGIOPLASTY N/A 08/21/2015   Procedure: ANGIOPLASTY WITH PERCUTANEOUS CORONARY INTERVENTION WITH DES TO RIGHT CORONARY ARTERY;  Surgeon: Jettie Booze, MD;  Location: [facility];  Service: Cardiovascular;  Laterality: N/A;   BACK SURGERY  2006   microdisectomy L5-S1   CARDIAC CATHETERIZATION N/A 08/18/2015   Procedure: Left Heart Cath and Coronary Angiography;  Surgeon: Jettie Booze, MD;  Location: [facility];  Service: Cardiovascular;  Laterality: N/A;   CARDIAC CATHETERIZATION N/A 08/21/2015   Procedure: Coronary Stent Intervention;  Surgeon: Jettie Booze, MD;  Location: [facility];  Service: Cardiovascular;  Laterality: N/A;   CARDIAC CATHETERIZATION  08/21/2015   Procedure: Bypass Graft Angiography;  Surgeon: Jettie Booze, MD;  Location: [facility];  Service: Cardiovascular;;   CARDIOVASCULAR STRESS TEST  08/15/2015   cataract surgery      COLONOSCOPY     CORONARY ARTERY BYPASS GRAFT N/A 08/21/2015   Procedure: OFF PUMP CORONARY ARTERY BYPASS GRAFTING (CABG), TIMES TWO,  USING LEFT INTERNAL MAMMARY ARTERY, RIGHT GREATER SAPHENOUS VEIN HARVESTED ENDOSCOPICALLY;  Surgeon: [name], MD;  Location: [facility];  Service: Open Heart Surgery;  Laterality: N/A;   CYSTOSCOPY W/ URETERAL STENT PLACEMENT Right 03/12/2017   Procedure: CYSTOSCOPY WITH RETROGRADE PYELOGRAM/URETERAL STENT PLACEMENT;  Surgeon: Bjorn Loser, MD;  [facility];  Service: Urology;  Laterality: Right;   CYSTOSCOPY W/ URETERAL STENT PLACEMENT  Right 12/13/2020   Procedure: CYSTOSCOPY WITH RETROGRADE PYELOGRAM/URETERAL STENT PLACEMENT;  Surgeon: Franchot Gallo, MD;  [facility];  Service: Urology;  Laterality: Right;   CYSTOSCOPY WITH RETROGRADE PYELOGRAM, URETEROSCOPY AND STENT PLACEMENT Right 01/06/2014   Procedure: CYSTOSCOPY WITH RETROGRADE PYELOGRAM, URETEROSCOPY AND STENT PLACEMENT;  Surgeon: Bernestine Amass, MD;  [facility];  Service: Urology;  Laterality: Right;   CYSTOSCOPY/URETEROSCOPY/HOLMIUM LASER/STENT PLACEMENT Right 03/25/2017   Procedure: RIGHT URETEROSCOPY WITH HOLMIUM LASER STENT EXCHANGE;  Surgeon: [name], MD;  [facility];  Service: Urology;  Laterality: Right;   CYSTOSCOPY/URETEROSCOPY/HOLMIUM LASER/STENT PLACEMENT Bilateral 11/17/2020   Procedure: CYSTOSCOPY/URETEROSCOPY/HOLMIUM LASER/STENT PLACEMENT;  Surgeon: Lucas Mallow, MD;  [facility];  Service: Urology;  Laterality: Bilateral;   CYSTOSCOPY/URETEROSCOPY/HOLMIUM LASER/STENT PLACEMENT Bilateral 12/05/2020   Procedure: CYSTOSCOPY BILATERAL /URETEROSCOPY/HOLMIUM LASER/STENT PLACEMENT;  Surgeon: [name], MD;  [facility];  Service: Urology;  Laterality: Bilateral;   ESOPHAGOGASTRODUODENOSCOPY ENDOSCOPY     normal   LITHOTRIPSY     2-3 times in past   TEE WITHOUT CARDIOVERSION N/A 08/21/2015   Procedure: TRANSESOPHAGEAL ECHOCARDIOGRAM (TEE);  Surgeon: [name], MD;  Location: [facility];  Service: Open Heart Surgery;  Laterality: N/A;   TONSILLECTOMY     URETEROSCOPY WITH HOLMIUM LASER LITHOTRIPSY Right 12/13/2020   Procedure: URETEROSCOPY WITH HOLMIUM LASER LITHOTRIPSY;  Surgeon: Franchot Gallo, MD;  [facility];  Service: Urology;  Laterality: Right;   WISDOM TOOTH EXTRACTION         Home Medications    Prior to Admission medications   Medication Sig Start Date End Date Taking? Authorizing Provider  amoxicillin-clavulanate (AUGMENTIN) 875-125 MG tablet Take 1 tablet by mouth every 12 (twelve) hours.  12/17/21  Yes Volney American, PA-C  fluticasone [facility]) 50 MCG/ACT nasal spray Place 1 spray into both nostrils daily. 12/17/21  Yes Volney American, PA-C  amLODipine (NORVASC) 10 MG tablet Take 1 tablet (10 mg total) by mouth daily. 08/10/21   Sueanne Margarita, MD  atorvastatin (LIPITOR) 80 MG tablet Take 1 tablet (80 mg total) by mouth daily. 05/08/21   Sueanne Margarita, MD  benzonatate (TESSALON) 100 MG capsule Take 1 capsule (100 mg total) by mouth 3 (three) times daily as needed for cough. 11/20/21   Barrett Henle, MD  clopidogrel (PLAVIX) 75 MG tablet Take 1 tablet by mouth once daily 03/30/21   Turner, Eber Hong, MD  Evolocumab (REPATHA SURECLICK) 834 MG/ML SOAJ Inject 1 pen into the skin every 14 (fourteen) days. 05/08/21   Sueanne Margarita, MD  Insulin Glargine (BASAGLAR KWIKPEN) 100 UNIT/ML SOPN Inject 0.2 mLs (20 Units total) into the skin at bedtime. Patient taking differently: Inject 24 Units into the skin at bedtime. 10/10/16   Renato Shin, MD  insulin lispro (HUMALOG) 100 UNIT/ML injection Inject 10-20 Units into the skin daily as needed for high blood sugar. With meals as needed  15-20 units with meals as needed  > 150= 20 units  ? 100= 15 units    [provider]  latanoprost (XALATAN) 0.005 % ophthalmic solution Place  1 drop into both eyes at bedtime. 10/28/20   [provider]  lisinopril (ZESTRIL) 20 MG tablet Take 20 mg by mouth daily. 10/19/20   [provider]  loratadine (CLARITIN) 10 MG tablet Take 10 mg by mouth daily as needed for allergies. 09/19/18   [provider]  metoprolol succinate (TOPROL-XL) 50 MG 24 hr tablet Take 1 tablet (50 mg total) by mouth 2 (two) times daily. Take with or immediately following a meal. 05/08/21   Turner, Eber Hong, MD  montelukast (SINGULAIR) 10 MG tablet Take 10 mg by mouth at bedtime. 12/10/18   [provider]  nitroGLYCERIN (NITROSTAT) 0.4 MG SL tablet DISSOLVE ONE TABLET UNDER THE  TONGUE EVERY 5 MINUTES AS NEEDED FOR CHEST PAIN.  DO NOT EXCEED A TOTAL OF 3 DOSES IN 15 MINUTES NOW Patient taking differently: Place 0.4 mg under the tongue every 5 (five) minutes as needed for chest pain. 12/01/19   Sueanne Margarita, MD  ondansetron (ZOFRAN-ODT) 4 MG disintegrating tablet Take 1 tablet (4 mg total) by mouth every 8 (eight) hours as needed for nausea or vomiting. 11/20/21   Barrett Henle, MD  pantoprazole (PROTONIX) 40 MG tablet Take 1 tablet (40 mg total) by mouth daily before breakfast. Patient taking differently: Take 40 mg by mouth daily as needed (indigestion). 05/18/19   Gatha Mayer, MD  tadalafil (CIALIS) 10 MG tablet Take 10 mg by mouth daily as needed for erectile dysfunction. 11/09/20   [provider]  timolol (TIMOPTIC) 0.5 % ophthalmic solution Place 1 drop into both eyes 2 (two) times daily.  07/19/15   [provider]  traMADol (ULTRAM) 50 MG tablet Take 50 mg by mouth every 6 (six) hours as needed for moderate pain. 11/13/20   [provider]    Family History Family History  Problem Relation Age of Onset   Kidney failure Mother    Heart attack Mother        angina    Diabetes Mother    Throat cancer Father    Diabetes Sister    Colon cancer Neg Hx    Colon polyps Neg Hx    Rectal cancer Neg Hx    Stomach cancer Neg Hx     Social History Social History   Tobacco Use   Smoking status: Never   Smokeless tobacco: Never  Vaping Use   Vaping Use: Never used  Substance Use Topics   Alcohol use: Never   Drug use: No     Allergies   Vicodin [hydrocodone-acetaminophen], Hydrocodone, Oxycontin [oxycodone hcl], and Percocet [oxycodone-acetaminophen]   Review of Systems Review of Systems PER HPI   Physical Exam Triage Vital Signs ED Triage Vitals  Enc Vitals Group     BP 12/17/21 0937 (!) 175/85     Pulse Rate 12/17/21 0937 71     Resp 12/17/21 0937 17     Temp 12/17/21 0937 99 F (37.2 C)     Temp Source  12/17/21 0937 Oral     SpO2 12/17/21 0937 95 %     Weight --      Height --      Head Circumference --      Peak Flow --      Pain Score 12/17/21 0936 0     Pain Loc --      Pain Edu? --      Excl. in [place]? --    No data found.  Updated Vital Signs BP [name])  175/85 (BP Location: Left Arm)   Pulse 71   Temp 99 F (37.2 C) (Oral)   Resp 17   SpO2 95%   Visual Acuity Right Eye Distance:   Left Eye Distance:   Bilateral Distance:    Right Eye Near:   Left Eye Near:    Bilateral Near:     Physical Exam Vitals and nursing note reviewed.  Constitutional:      Appearance: Normal appearance.  HENT:     Head: Atraumatic.     Right Ear: Tympanic membrane normal.     Left Ear: Tympanic membrane normal.     Nose: Congestion present.     Mouth/Throat:     Mouth: Mucous membranes are moist.     Pharynx: Posterior oropharyngeal erythema present. No oropharyngeal exudate.  Eyes:     Extraocular Movements: Extraocular movements intact.     Conjunctiva/sclera: Conjunctivae normal.  Cardiovascular:     Rate and Rhythm: Normal rate and regular rhythm.  Pulmonary:     Effort: Pulmonary effort is normal.     Breath sounds: Normal breath sounds. No wheezing or rales.  Musculoskeletal:        General: Normal range of motion.     Cervical back: Normal range of motion and neck supple.  Skin:    General: Skin is warm and dry.  Neurological:     General: No focal deficit present.     Mental Status: He is oriented to person, place, and time.  Psychiatric:        Mood and Affect: Mood normal.        Thought Content: Thought content normal.        Judgment: Judgment normal.     UC Treatments / Results  Labs (all labs ordered are listed, but only abnormal results are displayed) Labs Reviewed - No data to display  EKG  Radiology No results found.  Procedures Procedures (including critical care time)  Medications Ordered in UC Medications - No data to display  Initial  Impression / Assessment and Plan / UC Course  I have reviewed the triage vital signs and the nursing notes.  Pertinent labs & imaging results that were available during my care of the patient were reviewed by me and considered in my medical decision making (see chart for details).     Given duration and worsening course, treat with Augmentin, Flonase, supportive over-the-counter medications and home care.  Return for worsening symptoms.  Final Clinical Impressions(s) / UC Diagnoses   Final diagnoses:  Upper respiratory tract infection, unspecified type   Discharge Instructions   None    ED Prescriptions     Medication Sig Dispense Auth. Provider   amoxicillin-clavulanate (AUGMENTIN) 875-125 MG tablet Take 1 tablet by mouth every 12 (twelve) hours. 14 tablet Volney American, PA-C   fluticasone [facility]) 50 MCG/ACT nasal spray Place 1 spray into both nostrils daily. 16 g Volney American, [place]      PDMP not reviewed this encounter.   Volney American, [place] 12/17/21 1114

## 2021-12-17 NOTE — ED Triage Notes (Signed)
Pt presents with congestion and cough X 3 weeks post covid.

## 2021-12-28 DIAGNOSIS — E039 Hypothyroidism, unspecified: Secondary | ICD-10-CM | POA: Diagnosis not present

## 2021-12-28 DIAGNOSIS — Z8349 Family history of other endocrine, nutritional and metabolic diseases: Secondary | ICD-10-CM | POA: Diagnosis not present

## 2021-12-28 DIAGNOSIS — E1151 Type 2 diabetes mellitus with diabetic peripheral angiopathy without gangrene: Secondary | ICD-10-CM | POA: Diagnosis not present

## 2021-12-28 DIAGNOSIS — Z794 Long term (current) use of insulin: Secondary | ICD-10-CM | POA: Diagnosis not present

## 2021-12-28 DIAGNOSIS — I2510 Atherosclerotic heart disease of native coronary artery without angina pectoris: Secondary | ICD-10-CM | POA: Diagnosis not present

## 2021-12-28 DIAGNOSIS — N1832 Chronic kidney disease, stage 3b: Secondary | ICD-10-CM | POA: Diagnosis not present

## 2021-12-28 DIAGNOSIS — R799 Abnormal finding of blood chemistry, unspecified: Secondary | ICD-10-CM | POA: Diagnosis not present

## 2021-12-28 DIAGNOSIS — I10 Essential (primary) hypertension: Secondary | ICD-10-CM | POA: Diagnosis not present

## 2021-12-28 DIAGNOSIS — E785 Hyperlipidemia, unspecified: Secondary | ICD-10-CM | POA: Diagnosis not present

## 2022-01-10 DIAGNOSIS — N261 Atrophy of kidney (terminal): Secondary | ICD-10-CM | POA: Diagnosis not present

## 2022-01-10 DIAGNOSIS — N179 Acute kidney failure, unspecified: Secondary | ICD-10-CM | POA: Diagnosis not present

## 2022-01-10 DIAGNOSIS — N200 Calculus of kidney: Secondary | ICD-10-CM | POA: Diagnosis not present

## 2022-01-10 DIAGNOSIS — E559 Vitamin D deficiency, unspecified: Secondary | ICD-10-CM | POA: Diagnosis not present

## 2022-01-10 DIAGNOSIS — I129 Hypertensive chronic kidney disease with stage 1 through stage 4 chronic kidney disease, or unspecified chronic kidney disease: Secondary | ICD-10-CM | POA: Diagnosis not present

## 2022-01-10 DIAGNOSIS — N135 Crossing vessel and stricture of ureter without hydronephrosis: Secondary | ICD-10-CM | POA: Diagnosis not present

## 2022-01-10 DIAGNOSIS — N1832 Chronic kidney disease, stage 3b: Secondary | ICD-10-CM | POA: Diagnosis not present

## 2022-02-27 DIAGNOSIS — H1045 Other chronic allergic conjunctivitis: Secondary | ICD-10-CM | POA: Diagnosis not present

## 2022-02-27 DIAGNOSIS — J3089 Other allergic rhinitis: Secondary | ICD-10-CM | POA: Diagnosis not present

## 2022-02-27 DIAGNOSIS — K219 Gastro-esophageal reflux disease without esophagitis: Secondary | ICD-10-CM | POA: Diagnosis not present

## 2022-03-04 DIAGNOSIS — N200 Calculus of kidney: Secondary | ICD-10-CM | POA: Diagnosis not present

## 2022-03-04 DIAGNOSIS — N5201 Erectile dysfunction due to arterial insufficiency: Secondary | ICD-10-CM | POA: Diagnosis not present

## 2022-03-04 DIAGNOSIS — N270 Small kidney, unilateral: Secondary | ICD-10-CM | POA: Diagnosis not present

## 2022-03-11 ENCOUNTER — Other Ambulatory Visit: Payer: Self-pay | Admitting: Cardiology

## 2022-04-04 DIAGNOSIS — E1151 Type 2 diabetes mellitus with diabetic peripheral angiopathy without gangrene: Secondary | ICD-10-CM | POA: Diagnosis not present

## 2022-04-04 DIAGNOSIS — E039 Hypothyroidism, unspecified: Secondary | ICD-10-CM | POA: Diagnosis not present

## 2022-04-04 DIAGNOSIS — I2510 Atherosclerotic heart disease of native coronary artery without angina pectoris: Secondary | ICD-10-CM | POA: Diagnosis not present

## 2022-04-04 DIAGNOSIS — Z8349 Family history of other endocrine, nutritional and metabolic diseases: Secondary | ICD-10-CM | POA: Diagnosis not present

## 2022-04-04 DIAGNOSIS — N1832 Chronic kidney disease, stage 3b: Secondary | ICD-10-CM | POA: Diagnosis not present

## 2022-04-04 DIAGNOSIS — I10 Essential (primary) hypertension: Secondary | ICD-10-CM | POA: Diagnosis not present

## 2022-04-04 DIAGNOSIS — E785 Hyperlipidemia, unspecified: Secondary | ICD-10-CM | POA: Diagnosis not present

## 2022-04-04 DIAGNOSIS — Z794 Long term (current) use of insulin: Secondary | ICD-10-CM | POA: Diagnosis not present

## 2022-06-22 ENCOUNTER — Other Ambulatory Visit: Payer: Self-pay | Admitting: Cardiology

## 2022-06-24 ENCOUNTER — Other Ambulatory Visit: Payer: Self-pay | Admitting: Cardiology

## 2022-06-24 ENCOUNTER — Telehealth: Payer: Self-pay | Admitting: Cardiology

## 2022-06-24 MED ORDER — CLOPIDOGREL BISULFATE 75 MG PO TABS: 75.0000 mg | ORAL_TABLET | Freq: Every day | ORAL | 0 refills | Status: DC

## 2022-06-24 NOTE — Telephone Encounter (Signed)
Pt scheduled to see Ambrose Pancoast, NP, 07/01/22, will send in a 30 day supply of Plavix to [facility], per pt request.

## 2022-06-24 NOTE — Telephone Encounter (Signed)
*  STAT* If patient is at the pharmacy, call can be transferred to refill team.   1. Which medications need to be refilled? (please list name of each medication and dose if known)   clopidogrel (PLAVIX) 75 MG tablet    2. Which pharmacy/location (including street and city if local pharmacy) is medication to be sent to? [place], [address]   3. Do they need a 30 day or 90 day supply?  90 day   Pt has scheduled appt on 07/01/22

## 2022-06-27 ENCOUNTER — Encounter: Payer: Self-pay | Admitting: Internal Medicine

## 2022-06-27 NOTE — Progress Notes (Signed)
Office Visit    Patient Name: Jeffrey Holmes Date of Encounter: 07/01/2022  Primary Care Provider:  Pcp, No Primary Cardiologist:  Fransico Him, MD Primary Electrophysiologist: None  Chief Complaint    Jeffrey Holmes is a 71 y.o. male with PMH of CAD s/p hybrid CABG with LIMA to LAD/Diag and PCI of the RCA , HTN, HLD, CKD stage III who presents today for follow-up of CAD and HTN.  Past Medical History    Past Medical History:  Diagnosis Date   Allergy    Benign essential HTN 08/08/2015   CAD (coronary artery disease), native coronary artery 08/28/2015   Severe multivessel ASCAD s/p CABG hybrid with LIMA to LAD and diag and PCI of the RCA.    Chronic back pain    L5-S1   Chronic kidney disease, stage 3 (HCC)    multiple kidney stones   Diabetes mellitus without complication (HCC)    TYPE 2   GERD (gastroesophageal reflux disease)    OCC   H/O hiatal hernia    Hepatic cyst 11/01/2016   History of acute renal failure 2015   secondary to stones, ELEVATED CREATININE, NO DIALYSIS DONE   History of kidney stones    Hx of adenomatous colonic polyps 06/11/2017   Hyperlipidemia 08/08/2015   NAFLD (nonalcoholic fatty liver disease) 11/01/2016   PONV (postoperative nausea and vomiting)    hx of years ago    Splenic cyst 11/01/2016   Ureteral calculus 01/06/2014   Past Surgical History:  Procedure Laterality Date   ANGIOPLASTY N/A 08/21/2015   Procedure: ANGIOPLASTY WITH PERCUTANEOUS CORONARY INTERVENTION WITH DES TO RIGHT CORONARY ARTERY;  Surgeon: Jettie Booze, MD;  Location: [facility];  Service: Cardiovascular;  Laterality: N/A;   BACK SURGERY  2006   microdisectomy L5-S1   CARDIAC CATHETERIZATION N/A 08/18/2015   Procedure: Left Heart Cath and Coronary Angiography;  Surgeon: Jettie Booze, MD;  Location: [facility];  Service: Cardiovascular;  Laterality: N/A;   CARDIAC CATHETERIZATION N/A 08/21/2015   Procedure: Coronary Stent Intervention;  Surgeon: Jettie Booze, MD;   Location: [facility];  Service: Cardiovascular;  Laterality: N/A;   CARDIAC CATHETERIZATION  08/21/2015   Procedure: Bypass Graft Angiography;  Surgeon: Jettie Booze, MD;  Location: [facility];  Service: Cardiovascular;;   CARDIOVASCULAR STRESS TEST  08/15/2015   cataract surgery      COLONOSCOPY     CORONARY ARTERY BYPASS GRAFT N/A 08/21/2015   Procedure: OFF PUMP CORONARY ARTERY BYPASS GRAFTING (CABG), TIMES TWO, USING LEFT INTERNAL MAMMARY ARTERY, RIGHT GREATER SAPHENOUS VEIN HARVESTED ENDOSCOPICALLY;  Surgeon: [name], MD;  Location: [facility];  Service: Open Heart Surgery;  Laterality: N/A;   CYSTOSCOPY W/ URETERAL STENT PLACEMENT Right 03/12/2017   Procedure: CYSTOSCOPY WITH RETROGRADE PYELOGRAM/URETERAL STENT PLACEMENT;  Surgeon: Bjorn Loser, MD;  Location: [facility];  Service: Urology;  Laterality: Right;   CYSTOSCOPY W/ URETERAL STENT PLACEMENT Right 12/13/2020   Procedure: CYSTOSCOPY WITH RETROGRADE PYELOGRAM/URETERAL STENT PLACEMENT;  Surgeon: Franchot Gallo, MD;  Location: [facility];  Service: Urology;  Laterality: Right;   CYSTOSCOPY WITH RETROGRADE PYELOGRAM, URETEROSCOPY AND STENT PLACEMENT Right 01/06/2014   Procedure: CYSTOSCOPY WITH RETROGRADE PYELOGRAM, URETEROSCOPY AND STENT PLACEMENT;  Surgeon: Bernestine Amass, MD;  Location: [facility];  Service: Urology;  Laterality: Right;   CYSTOSCOPY/URETEROSCOPY/HOLMIUM LASER/STENT PLACEMENT Right 03/25/2017   Procedure: RIGHT URETEROSCOPY WITH HOLMIUM LASER STENT EXCHANGE;  Surgeon: [name], MD;  Location: [facility];  Service: Urology;  Laterality: Right;   CYSTOSCOPY/URETEROSCOPY/HOLMIUM LASER/STENT PLACEMENT Bilateral 11/17/2020   Procedure: CYSTOSCOPY/URETEROSCOPY/HOLMIUM LASER/STENT PLACEMENT;  Surgeon: Lucas Mallow, MD;  Location: [facility];  Service: Urology;  Laterality: Bilateral;   CYSTOSCOPY/URETEROSCOPY/HOLMIUM LASER/STENT PLACEMENT Bilateral 12/05/2020   Procedure: CYSTOSCOPY BILATERAL  /URETEROSCOPY/HOLMIUM LASER/STENT PLACEMENT;  Surgeon: [name], MD;  Location: [facility];  Service: Urology;  Laterality: Bilateral;   ESOPHAGOGASTRODUODENOSCOPY ENDOSCOPY     normal   LITHOTRIPSY     2-3 times in past   TEE WITHOUT CARDIOVERSION N/A 08/21/2015   Procedure: TRANSESOPHAGEAL ECHOCARDIOGRAM (TEE);  Surgeon: [name], MD;  Location: [facility];  Service: Open Heart Surgery;  Laterality: N/A;   TONSILLECTOMY     URETEROSCOPY WITH HOLMIUM LASER LITHOTRIPSY Right 12/13/2020   Procedure: URETEROSCOPY WITH HOLMIUM LASER LITHOTRIPSY;  Surgeon: Franchot Gallo, MD;  Location: [facility];  Service: Urology;  Laterality: Right;   WISDOM TOOTH EXTRACTION      Allergies  Allergies  Allergen Reactions   Vicodin [Hydrocodone-Acetaminophen] Nausea And Vomiting   Hydrocodone Nausea And Vomiting   Oxycontin [Oxycodone Hcl] Nausea And Vomiting   Percocet [Oxycodone-Acetaminophen] Nausea And Vomiting    History of Present Illness    Jeffrey Holmes  is a 71 year old male with the above mention past medical history who presents today for follow-up of CAD and HTN.  Mr. Pariseau was initially seen in the office by Dr. Radford Pax in 2017 for complaint of chest pain and shortness of breath.  He described the pain as left-sided and occurs with physical exertion.  He completed a stress Myoview which indicated anterior ischemia and was high risk.  He underwent LHC by Dr. Irish Lack that revealed severe three-vessel CAD.  He was referred to CVTS and underwent hybrid CABG with no complications.  He was seen in follow-up and reported doing well and was participating in cardiac rehab.  He continued to do well with no complaints of chest pain or palpitations.  He was seen and 12/2018 and endorsed mild DOE with minimal exertion.  He underwent Lexiscan Myoview that was normal.  He was last seen by Dr. Radford Pax 04/2021 for follow-up.  During visit patient endorsed some anginal chest pain that occurred once last month.  He  underwent stress Myoview that revealed hypertensive BP response with exercise.  He wore a 48-hour BP monitor that revealed poor control of blood pressure.  He was started on amlodipine 5 mg daily with follow-up in hypertension clinic.  He was seen in hypertension clinic and blood pressures were still elevated and amlodipine was increased to 10 mg and patient advised to check blood pressures frequently.  Mr. House presents today for annual follow-up alone.  Since last being seen in the office patient reports that he is doing well from cardiac perspective.  His blood pressure today however is elevated at 160/80 and was 164/74 on recheck.  He reports at home that his blood pressures are usually within normal limits in the 983J to 825K systolically.  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  He reports some shortness of breath with exertion that he feels is related more to deconditioning.  He denies any chest pain or dizziness currently.  He is currently seeing a nephrologist and endocrinologist for managing his kidney function.  He is tolerating his current medicines without any adverse reaction but does report inconsistent dosing with his Repatha.  He states that his current situation with  his brother is one reason for missing doses.  We will plan to recheck his cholesterol later this week when fasting..  Patient denies chest pain, palpitations, dyspnea, PND, orthopnea, nausea, vomiting, dizziness, syncope, edema, weight gain, or early satiety.   Home Medications    Current Outpatient Medications  Medication Sig Dispense Refill   amLODipine (NORVASC) 10 MG tablet Take 1 tablet (10 mg total) by mouth daily. 90 tablet 3   atorvastatin (LIPITOR) 80 MG tablet Take 1 tablet (80 mg total) by mouth daily. Please keep your upcoming appointment for future refills. Thank you. 30 tablet 0   clopidogrel (PLAVIX) 75 MG tablet Take 1 tablet (75 mg total) by mouth  daily. 30 tablet 0   Evolocumab (REPATHA SURECLICK) 332 MG/ML SOAJ Inject 1 pen into the skin every 14 (fourteen) days. 6 mL 3   fluticasone (FLONASE) 50 MCG/ACT nasal spray Place 1 spray into both nostrils daily. 16 g 2   Insulin Glargine (BASAGLAR KWIKPEN) 100 UNIT/ML SOPN Inject 0.2 mLs (20 Units total) into the skin at bedtime. (Patient taking differently: Inject 24 Units into the skin at bedtime.) 5 pen 11   insulin lispro (HUMALOG) 100 UNIT/ML injection Inject 10-20 Units into the skin daily as needed for high blood sugar. With meals as needed  15-20 units with meals as needed  > 150= 20 units  ? 100= 15 units     latanoprost (XALATAN) 0.005 % ophthalmic solution Place 1 drop into both eyes at bedtime.     lisinopril (ZESTRIL) 20 MG tablet Take 20 mg by mouth daily.     loratadine (CLARITIN) 10 MG tablet Take 10 mg by mouth daily as needed for allergies.     metoprolol succinate (TOPROL-XL) 50 MG 24 hr tablet Take 1 tablet (50 mg total) by mouth 2 (two) times daily. Take with or immediately following a meal. 180 tablet 2   montelukast (SINGULAIR) 10 MG tablet Take 10 mg by mouth at bedtime.     nitroGLYCERIN (NITROSTAT) 0.4 MG SL tablet DISSOLVE ONE TABLET UNDER THE TONGUE EVERY 5 MINUTES AS NEEDED FOR CHEST PAIN.  DO NOT EXCEED A TOTAL OF 3 DOSES IN 15 MINUTES NOW (Patient taking differently: Place 0.4 mg under the tongue every 5 (five) minutes as needed for chest pain.) 25 tablet 1   ondansetron (ZOFRAN-ODT) 4 MG disintegrating tablet Take 1 tablet (4 mg total) by mouth every 8 (eight) hours as needed for nausea or vomiting. 10 tablet 0   pantoprazole (PROTONIX) 40 MG tablet Take 1 tablet (40 mg total) by mouth daily before breakfast. (Patient taking differently: Take 40 mg by mouth daily as needed (indigestion).) 30 tablet 11   tadalafil (CIALIS) 10 MG tablet Take 10 mg by mouth daily as needed for erectile dysfunction.     timolol (TIMOPTIC) 0.5 % ophthalmic solution Place 1 drop into both  eyes 2 (two) times daily.      traMADol (ULTRAM) 50 MG tablet Take 50 mg by mouth every 6 (six) hours as needed for moderate pain.     No current facility-administered medications for this visit.     Review of Systems  Please see the history of present illness.    (+) Shortness of breath with heavy exertion (+) Anxiety  All other systems reviewed and are otherwise negative except as noted above.  Physical Exam    Wt Readings from Last 3 Encounters:  07/01/22 198 lb 8 oz (90 kg)  09/12/21 188 lb 0.8 oz (85.3 kg)  05/14/21 188 lb (85.3 kg)   VS: Vitals:   07/01/22 0952 07/01/22 1114  BP: (!) 164/80 (!) 164/74  Pulse: 74   SpO2: 96%   ,Body mass index is 33.03 kg/m.  Constitutional:      Appearance: Healthy appearance. Not in distress.  Neck:     Vascular: JVD normal.  Pulmonary:     Effort: Pulmonary effort is normal.     Breath sounds: No wheezing. No rales. Diminished in the bases Cardiovascular:     Normal rate. Regular rhythm. Normal S1. Normal S2.      Murmurs: There is no murmur.  Edema:    Peripheral edema absent.  Abdominal:     Palpations: Abdomen is soft non tender. There is no hepatomegaly.  Skin:    General: Skin is warm and dry.  Neurological:     General: No focal deficit present.     Mental Status: Alert and oriented to person, place and time.     Cranial Nerves: Cranial nerves are intact.  EKG/LABS/Other Studies Reviewed    ECG personally reviewed by me today -sinus rhythm with nonspecific T wave abnormalities and rate of 74 bpm with no acute changes noted.   Lab Results  Component Value Date   WBC 9.8 09/13/2021   HGB 17.3 (H) 09/13/2021   HCT 51.9 09/13/2021   MCV 91.7 09/13/2021   PLT 176 09/13/2021   Lab Results  Component Value Date   CREATININE 5.21 (H) 09/13/2021   BUN 79 (H) 09/13/2021   NA 136 09/13/2021   K 3.6 09/13/2021   CL 101 09/13/2021   CO2 23 09/13/2021   Lab Results  Component Value Date   ALT 13 05/14/2021    AST 13 (L) 11/17/2020   ALKPHOS 79 11/17/2020   BILITOT 0.9 11/17/2020   Lab Results  Component Value Date   CHOL 224 (H) 05/14/2021   HDL 41 05/14/2021   LDLCALC 140 (H) 05/14/2021   LDLDIRECT 13 07/08/2016   TRIG 239 (H) 05/14/2021   CHOLHDL 5.5 (H) 05/14/2021    Lab Results  Component Value Date   HGBA1C 8.1 (H) 11/18/2020    Assessment & Plan    1.  Coronary artery disease: -s/p hybrid CABG with LIMA to LAD/Diag and PCI of the RCA.  Patient underwent exercise Myoview in 2022 due to stable angina that indicated no ischemia but patient had poor exercise response with blood pressure. -Today patient reports no chest pain or anginal equivalent. -Continue current GDMT with Plavix 75 mg, Lipitor 80 mg, Repatha 140 mg, metoprolol 50 mg twice daily, and as needed Nitrostat 0.4 mg  2.  Essential hypertension: -HYPERTENSION CONTROL Vitals:   07/01/22 0952 07/01/22 1114  BP: (!) 164/80 (!) 164/74    The patient's blood pressure is elevated above target today.  In order to address the patient's elevated BP: The blood pressure is usually elevated in clinic.  Blood pressures monitored at home have been optimal.; Blood pressure will be monitored at home to determine if medication changes need to be made.      -He was referred to hypertension clinic with titration made to current therapy -Continue amlodipine 10 mg, lisinopril 20 mg, Toprol XL 50 mg twice daily -Patient will monitor blood pressures over the next 2 weeks and if elevated we will increase lisinopril to 40 mg daily  3.  Hyperlipidemia: -Patient's last LDL was 140 in 2022 -We will have patient return for fasting LFTs and lipids -He reports inconsistent dosing  with his Repatha and was encouraged to continue normal dosing -Patient currently on Repatha and atorvastatin 80 mg  4.  CKD stage III -He is currently followed by nephrology and endocrinology. -We will plan to add SGLT2 inhibitor after having conversation with his  nephrologist.  Disposition: Follow-up with Fransico Him, MD or APP in 3 months    Medication Adjustments/Labs and Tests Ordered: Current medicines are reviewed at length with the patient today.  Concerns regarding medicines are outlined above.   Signed, Mable Fill, Marissa Nestle, NP 07/01/2022, 11:15 AM [facility]  Note:  This document was prepared using Dragon voice recognition software and may include unintentional dictation errors.

## 2022-07-01 ENCOUNTER — Encounter: Payer: Self-pay | Admitting: Nurse Practitioner

## 2022-07-01 ENCOUNTER — Ambulatory Visit: Payer: Medicare Other | Attending: Nurse Practitioner | Admitting: Nurse Practitioner

## 2022-07-01 VITALS — BP 164/74 | HR 74 | Ht 65.0 in | Wt 198.5 lb

## 2022-07-01 DIAGNOSIS — N183 Chronic kidney disease, stage 3 unspecified: Secondary | ICD-10-CM | POA: Diagnosis not present

## 2022-07-01 DIAGNOSIS — I10 Essential (primary) hypertension: Secondary | ICD-10-CM | POA: Diagnosis not present

## 2022-07-01 DIAGNOSIS — E7800 Pure hypercholesterolemia, unspecified: Secondary | ICD-10-CM | POA: Diagnosis not present

## 2022-07-01 DIAGNOSIS — I2510 Atherosclerotic heart disease of native coronary artery without angina pectoris: Secondary | ICD-10-CM | POA: Insufficient documentation

## 2022-07-01 DIAGNOSIS — Z7689 Persons encountering health services in other specified circumstances: Secondary | ICD-10-CM | POA: Diagnosis not present

## 2022-07-01 NOTE — Patient Instructions (Addendum)
Medication Instructions:  Your physician recommends that you continue on your current medications as directed. Please refer to the Current Medication list given to you today. *If you need a refill on your cardiac medications before your next appointment, please call your pharmacy*   Lab Work: WHEN CONVENIENT LIPID & LFT If you have labs (blood work) drawn today and your tests are completely normal, you will receive your results only by: [place] (if you have MyChart) OR A paper copy in the mail If you have any lab test that is abnormal or we need to change your treatment, we will call you to review the results.   Testing/Procedures: NONE ORDERED   Follow-Up: At [facility], you and your health needs are our priority.  As part of our continuing mission to provide you with exceptional heart care, we have created designated Provider Care Teams.  These Care Teams include your primary Cardiologist (physician) and Advanced Practice Providers (APPs -  Physician Assistants and Nurse Practitioners) who all work together to provide you with the care you need, when you need it.  We recommend signing up for the patient portal called "MyChart".  Sign up information is provided on this After Visit Summary.  MyChart is used to connect with patients for Virtual Visits (Telemedicine).  Patients are able to view lab/test results, encounter notes, upcoming appointments, etc.  Non-urgent messages can be sent to your provider as well.   To learn more about what you can do with MyChart, go to [email or web address].    Your next appointment:   3 month(s)  The format for your next appointment:   In Person  Provider:   Ambrose Pancoast, NP       Other Instructions CHECK YOUR BLOOD PRESSURE DAILY FOR 2 WEEKS THEN CONTACT THE OFFICE WITH YOUR READINGS YOUR GOAL BLOOD PRESSURE IS 130/80 You have been referred to PCP DR POLITE BRING YOUR BLOOD PRESSURE CUFF TO  YOUR NEXT APPOINTMENT   SGLT2 GLP1 (OZEMPIC AND VICTOZA)  Important Information About Sugar

## 2022-07-09 ENCOUNTER — Ambulatory Visit: Payer: Medicare Other | Attending: Nurse Practitioner

## 2022-07-09 DIAGNOSIS — I2510 Atherosclerotic heart disease of native coronary artery without angina pectoris: Secondary | ICD-10-CM | POA: Diagnosis not present

## 2022-07-09 DIAGNOSIS — Z7689 Persons encountering health services in other specified circumstances: Secondary | ICD-10-CM | POA: Diagnosis not present

## 2022-07-09 DIAGNOSIS — E7800 Pure hypercholesterolemia, unspecified: Secondary | ICD-10-CM | POA: Diagnosis not present

## 2022-07-10 LAB — HEPATIC FUNCTION PANEL
ALT: 19 IU/L (ref 0–44)
AST: 19 IU/L (ref 0–40)
Albumin: 4 g/dL (ref 3.8–4.8)
Alkaline Phosphatase: 91 IU/L (ref 44–121)
Bilirubin Total: 0.3 mg/dL (ref 0.0–1.2)
Bilirubin, Direct: 0.12 mg/dL (ref 0.00–0.40)
Total Protein: 6.3 g/dL (ref 6.0–8.5)

## 2022-07-10 LAB — LIPID PANEL
Chol/HDL Ratio: 5 ratio (ref 0.0–5.0)
Cholesterol, Total: 222 mg/dL — ABNORMAL HIGH (ref 100–199)
HDL: 44 mg/dL (ref >39)
LDL Chol Calc (NIH): 137 mg/dL — ABNORMAL HIGH (ref 0–99)
Triglycerides: 226 mg/dL — ABNORMAL HIGH (ref 0–149)
VLDL Cholesterol Cal: 41 mg/dL — ABNORMAL HIGH (ref 5–40)

## 2022-07-12 DIAGNOSIS — R809 Proteinuria, unspecified: Secondary | ICD-10-CM | POA: Diagnosis not present

## 2022-07-12 DIAGNOSIS — N1832 Chronic kidney disease, stage 3b: Secondary | ICD-10-CM | POA: Diagnosis not present

## 2022-07-12 DIAGNOSIS — I129 Hypertensive chronic kidney disease with stage 1 through stage 4 chronic kidney disease, or unspecified chronic kidney disease: Secondary | ICD-10-CM | POA: Diagnosis not present

## 2022-07-12 DIAGNOSIS — M109 Gout, unspecified: Secondary | ICD-10-CM | POA: Diagnosis not present

## 2022-07-12 DIAGNOSIS — N135 Crossing vessel and stricture of ureter without hydronephrosis: Secondary | ICD-10-CM | POA: Diagnosis not present

## 2022-07-12 DIAGNOSIS — E559 Vitamin D deficiency, unspecified: Secondary | ICD-10-CM | POA: Diagnosis not present

## 2022-07-12 DIAGNOSIS — N261 Atrophy of kidney (terminal): Secondary | ICD-10-CM | POA: Diagnosis not present

## 2022-07-24 ENCOUNTER — Other Ambulatory Visit: Payer: Self-pay | Admitting: Cardiology

## 2022-07-28 ENCOUNTER — Other Ambulatory Visit: Payer: Self-pay | Admitting: Cardiology

## 2022-08-05 ENCOUNTER — Encounter: Payer: Self-pay | Admitting: Emergency Medicine

## 2022-08-05 ENCOUNTER — Ambulatory Visit
Admission: EM | Admit: 2022-08-05 | Discharge: 2022-08-05 | Disposition: A | Discharge status: Home / Self Care | Payer: Medicare Other | Attending: Internal Medicine | Admitting: Internal Medicine

## 2022-08-05 DIAGNOSIS — J069 Acute upper respiratory infection, unspecified: Secondary | ICD-10-CM | POA: Diagnosis not present

## 2022-08-05 MED ORDER — BENZONATATE 100 MG PO CAPS: 100.0000 mg | ORAL_CAPSULE | Freq: Three times a day (TID) | ORAL | 0 refills | Status: DC | PRN

## 2022-08-05 MED ORDER — FLUTICASONE PROPIONATE 50 MCG/ACT NA SUSP: 1.0000 | Freq: Every day | NASAL | 0 refills | Status: DC

## 2022-08-05 NOTE — Discharge Instructions (Signed)
You have a viral upper respiratory infection which should run its course and self resolve with symptomatic treatment as we discussed.  I have prescribed you 2 different medications to help alleviate symptoms.  Please follow-up if symptoms persist or worsen.

## 2022-08-05 NOTE — ED Triage Notes (Signed)
Patient c/o congestion, sore throat and cough x 2 days.  Cough can be productive first thing in the mornings.  Denies any OTC cold meds.

## 2022-08-05 NOTE — ED Provider Notes (Signed)
[facility]    CSN: [id number] Arrival date & time: 08/05/22  1204      History   Chief Complaint Chief Complaint  Patient presents with   Congestion    HPI Jeffrey Holmes is a 72 y.o. male.   Patient presents with nasal congestion and nonproductive cough that has been present for 2 days.  Denies any known sick contacts or fever at home.  Denies chest pain, shortness of breath, sore throat, ear pain, nausea, vomiting, diarrhea, abdominal pain.  Denies history of asthma or COPD and patient does not smoke cigarettes.  He has not taken any medications to help alleviate symptoms.     Past Medical History:  Diagnosis Date   Allergy    Benign essential HTN 08/08/2015   CAD (coronary artery disease), native coronary artery 08/28/2015   Severe multivessel ASCAD s/p CABG hybrid with LIMA to LAD and diag and PCI of the RCA.    Chronic back pain    L5-S1   Chronic kidney disease, stage 3 (HCC)    multiple kidney stones   Diabetes mellitus without complication (HCC)    TYPE 2   GERD (gastroesophageal reflux disease)    OCC   H/O hiatal hernia    Hepatic cyst 11/01/2016   History of acute renal failure 2015   secondary to stones, ELEVATED CREATININE, NO DIALYSIS DONE   History of kidney stones    Hx of adenomatous colonic polyps 06/11/2017   Hyperlipidemia 08/08/2015   NAFLD (nonalcoholic fatty liver disease) 11/01/2016   PONV (postoperative nausea and vomiting)    hx of years ago    Splenic cyst 11/01/2016   Ureteral calculus 01/06/2014    Patient Active Problem List   Diagnosis Date Noted   Kidney stone on right side 12/13/2020   AKI (acute kidney injury) ([place]) 11/17/2020   Uncontrolled type 2 diabetes mellitus with hyperglycemia ([place]) 04/05/2020   Hx of adenomatous colonic polyps 06/11/2017   Ureteral stone 03/12/2017   Diabetes ([place]) 11/11/2016   Hepatic cyst 11/01/2016   NAFLD (nonalcoholic fatty liver disease) 11/01/2016   Splenic cyst 11/01/2016   CAD (coronary  artery disease), native coronary artery 11/03/2015   Abnormal result of cardiovascular function study 08/16/2015   Chronic renal insufficiency, stage III (moderate) ([place]) 08/16/2015   Benign essential HTN 08/08/2015   Hyperlipidemia 08/08/2015    Past Surgical History:  Procedure Laterality Date   ANGIOPLASTY N/A 08/21/2015   Procedure: ANGIOPLASTY WITH PERCUTANEOUS CORONARY INTERVENTION WITH DES TO RIGHT CORONARY ARTERY;  Surgeon: Jettie Booze, MD;  Location: [place];  Service: Cardiovascular;  Laterality: N/A;   BACK SURGERY  2006   microdisectomy L5-S1   CARDIAC CATHETERIZATION N/A 08/18/2015   Procedure: Left Heart Cath and Coronary Angiography;  Surgeon: Jettie Booze, MD;  Location: [facility];  Service: Cardiovascular;  Laterality: N/A;   CARDIAC CATHETERIZATION N/A 08/21/2015   Procedure: Coronary Stent Intervention;  Surgeon: Jettie Booze, MD;  Location: [facility];  Service: Cardiovascular;  Laterality: N/A;   CARDIAC CATHETERIZATION  08/21/2015   Procedure: Bypass Graft Angiography;  Surgeon: Jettie Booze, MD;  Location: [facility];  Service: Cardiovascular;;   CARDIOVASCULAR STRESS TEST  08/15/2015   cataract surgery      COLONOSCOPY     CORONARY ARTERY BYPASS GRAFT N/A 08/21/2015   Procedure: OFF PUMP CORONARY ARTERY BYPASS GRAFTING (CABG), TIMES TWO, USING LEFT INTERNAL MAMMARY ARTERY, RIGHT GREATER SAPHENOUS VEIN HARVESTED ENDOSCOPICALLY;  Surgeon: [name], MD;  Location: [facility];  Service: Open Heart Surgery;  Laterality: N/A;   CYSTOSCOPY W/ URETERAL STENT PLACEMENT Right 03/12/2017   Procedure: CYSTOSCOPY WITH RETROGRADE PYELOGRAM/URETERAL STENT PLACEMENT;  Surgeon: Bjorn Loser, MD;  Location: [facility];  Service: Urology;  Laterality: Right;   CYSTOSCOPY W/ URETERAL STENT PLACEMENT Right 12/13/2020   Procedure: CYSTOSCOPY WITH RETROGRADE PYELOGRAM/URETERAL STENT PLACEMENT;  Surgeon: Franchot Gallo, MD;  Location:  [facility];  Service: Urology;  Laterality: Right;   CYSTOSCOPY WITH RETROGRADE PYELOGRAM, URETEROSCOPY AND STENT PLACEMENT Right 01/06/2014   Procedure: CYSTOSCOPY WITH RETROGRADE PYELOGRAM, URETEROSCOPY AND STENT PLACEMENT;  Surgeon: Bernestine Amass, MD;  Location: [facility];  Service: Urology;  Laterality: Right;   CYSTOSCOPY/URETEROSCOPY/HOLMIUM LASER/STENT PLACEMENT Right 03/25/2017   Procedure: RIGHT URETEROSCOPY WITH HOLMIUM LASER STENT EXCHANGE;  Surgeon: [name], MD;  Location: [facility];  Service: Urology;  Laterality: Right;   CYSTOSCOPY/URETEROSCOPY/HOLMIUM LASER/STENT PLACEMENT Bilateral 11/17/2020   Procedure: CYSTOSCOPY/URETEROSCOPY/HOLMIUM LASER/STENT PLACEMENT;  Surgeon: Lucas Mallow, MD;  Location: [facility];  Service: Urology;  Laterality: Bilateral;   CYSTOSCOPY/URETEROSCOPY/HOLMIUM LASER/STENT PLACEMENT Bilateral 12/05/2020   Procedure: CYSTOSCOPY BILATERAL /URETEROSCOPY/HOLMIUM LASER/STENT PLACEMENT;  Surgeon: [name], MD;  Location: [facility];  Service: Urology;  Laterality: Bilateral;   ESOPHAGOGASTRODUODENOSCOPY ENDOSCOPY     normal   LITHOTRIPSY     2-3 times in past   TEE WITHOUT CARDIOVERSION N/A 08/21/2015   Procedure: TRANSESOPHAGEAL ECHOCARDIOGRAM (TEE);  Surgeon: [name], MD;  Location: [facility];  Service: Open Heart Surgery;  Laterality: N/A;   TONSILLECTOMY     URETEROSCOPY WITH HOLMIUM LASER LITHOTRIPSY Right 12/13/2020   Procedure: URETEROSCOPY WITH HOLMIUM LASER LITHOTRIPSY;  Surgeon: Franchot Gallo, MD;  Location: [facility];  Service: Urology;  Laterality: Right;   WISDOM TOOTH EXTRACTION         Home Medications    Prior to Admission medications   Medication Sig Start Date End Date Taking? Authorizing Provider  amLODipine (NORVASC) 10 MG tablet Take 1 tablet (10 mg total) by mouth daily. 08/10/21  Yes Turner, Eber Hong, MD  atorvastatin (LIPITOR) 80 MG tablet Take 1 tablet (80 mg total) by mouth daily. 07/31/22  Yes Turner, Eber Hong, MD  benzonatate  (TESSALON) 100 MG capsule Take 1 capsule (100 mg total) by mouth every 8 (eight) hours as needed for cough. 08/05/22  Yes Oswaldo Conroy E, FNP  clopidogrel (PLAVIX) 75 MG tablet Take 1 tablet by mouth once daily 07/31/22  Yes Turner, Eber Hong, MD  Evolocumab (REPATHA SURECLICK) 169 MG/ML SOAJ Inject 1 pen into the skin every 14 (fourteen) days. 05/08/21  Yes Turner, Eber Hong, MD  fluticasone (FLONASE) 50 MCG/ACT nasal spray Place 1 spray into both nostrils daily. 08/05/22  Yes , Stidham E, FNP  Insulin Glargine (BASAGLAR KWIKPEN) 100 UNIT/ML SOPN Inject 0.2 mLs (20 Units total) into the skin at bedtime. Patient taking differently: Inject 24 Units into the skin at bedtime. 10/10/16  Yes Renato Shin, MD  insulin lispro (HUMALOG) 100 UNIT/ML injection Inject 10-20 Units into the skin daily as needed for high blood sugar. With meals as needed  15-20 units with meals as needed  > 150= 20 units  ? 100= 15 units   Yes [provider]  latanoprost (XALATAN) 0.005 % ophthalmic solution Place 1 drop into both eyes at bedtime. 10/28/20  Yes [provider]  lisinopril (ZESTRIL) 20 MG tablet Take 20 mg by mouth daily. 10/19/20  Yes [provider]  loratadine (CLARITIN)  10 MG tablet Take 10 mg by mouth daily as needed for allergies. 09/19/18  Yes [provider]  metoprolol succinate (TOPROL-XL) 50 MG 24 hr tablet TAKE 1 TABLET BY MOUTH TWICE DAILY WITH MEALS OR  IMMEDIATELY  FOLLOWING  A  MEAL 07/24/22  Yes Marylu Lund., NP  montelukast (SINGULAIR) 10 MG tablet Take 10 mg by mouth at bedtime. 12/10/18  Yes [provider]  nitroGLYCERIN (NITROSTAT) 0.4 MG SL tablet DISSOLVE ONE TABLET UNDER THE TONGUE EVERY 5 MINUTES AS NEEDED FOR CHEST PAIN.  DO NOT EXCEED A TOTAL OF 3 DOSES IN 15 MINUTES NOW Patient taking differently: Place 0.4 mg under the tongue every 5 (five) minutes as needed for chest pain. 12/01/19  Yes Turner, Eber Hong, MD  ondansetron (ZOFRAN-ODT) 4 MG  disintegrating tablet Take 1 tablet (4 mg total) by mouth every 8 (eight) hours as needed for nausea or vomiting. 11/20/21  Yes Banister, Gwenlyn Perking, MD  pantoprazole (PROTONIX) 40 MG tablet Take 1 tablet (40 mg total) by mouth daily before breakfast. Patient taking differently: Take 40 mg by mouth daily as needed (indigestion). 05/18/19  Yes Gatha Mayer, MD  tadalafil (CIALIS) 10 MG tablet Take 10 mg by mouth daily as needed for erectile dysfunction. 11/09/20  Yes [provider]  timolol (TIMOPTIC) 0.5 % ophthalmic solution Place 1 drop into both eyes 2 (two) times daily.  07/19/15  Yes [provider]  traMADol (ULTRAM) 50 MG tablet Take 50 mg by mouth every 6 (six) hours as needed for moderate pain. 11/13/20  Yes [provider]    Family History Family History  Problem Relation Age of Onset   Kidney failure Mother    Heart attack Mother        angina    Diabetes Mother    Throat cancer Father    Diabetes Sister    Colon cancer Neg Hx    Colon polyps Neg Hx    Rectal cancer Neg Hx    Stomach cancer Neg Hx     Social History Social History   Tobacco Use   Smoking status: Never   Smokeless tobacco: Never  Vaping Use   Vaping Use: Never used  Substance Use Topics   Alcohol use: Never   Drug use: No     Allergies   Vicodin [hydrocodone-acetaminophen], Hydrocodone, Oxycontin [oxycodone hcl], and Percocet [oxycodone-acetaminophen]   Review of Systems Review of Systems Per HPI  Physical Exam Triage Vital Signs ED Triage Vitals  Enc Vitals Group     BP 08/05/22 1228 (!) 157/79     Pulse Rate 08/05/22 1228 64     Resp 08/05/22 1228 18     Temp 08/05/22 1228 99 F (37.2 C)     Temp Source 08/05/22 1228 Oral     SpO2 08/05/22 1228 94 %     Weight 08/05/22 1231 195 lb (88.5 kg)     Height 08/05/22 1231 '5\' 5"'$  (1.651 m)     Head Circumference --      Peak Flow --      Pain Score 08/05/22 1230 0     Pain Loc --      Pain Edu? --      Excl.  in [place]? --    No data found.  Updated Vital Signs BP (!) 157/79 (BP Location: Left Arm)   Pulse 64   Temp 99 F (37.2 C) (Oral)   Resp 18   Ht '5\' 5"'$  (1.651  m)   Wt 195 lb (88.5 kg)   SpO2 96%   BMI 32.45 kg/m   Visual Acuity Right Eye Distance:   Left Eye Distance:   Bilateral Distance:    Right Eye Near:   Left Eye Near:    Bilateral Near:     Physical Exam Constitutional:      General: He is not in acute distress.    Appearance: Normal appearance. He is not toxic-appearing or diaphoretic.  HENT:     Head: Normocephalic and atraumatic.     Right Ear: Tympanic membrane and ear canal normal.     Left Ear: Tympanic membrane and ear canal normal.     Nose: Congestion present.     Mouth/Throat:     Mouth: Mucous membranes are moist.     Pharynx: No posterior oropharyngeal erythema.  Eyes:     Extraocular Movements: Extraocular movements intact.     Conjunctiva/sclera: Conjunctivae normal.     Pupils: Pupils are equal, round, and reactive to light.  Cardiovascular:     Rate and Rhythm: Normal rate and regular rhythm.     Pulses: Normal pulses.     Heart sounds: Normal heart sounds.  Pulmonary:     Effort: Pulmonary effort is normal. No respiratory distress.     Breath sounds: Normal breath sounds. No stridor. No wheezing, rhonchi or rales.  Abdominal:     General: Abdomen is flat. Bowel sounds are normal.     Palpations: Abdomen is soft.  Musculoskeletal:        General: Normal range of motion.     Cervical back: Normal range of motion.  Skin:    General: Skin is warm and dry.  Neurological:     General: No focal deficit present.     Mental Status: He is alert and oriented to person, place, and time. Mental status is at baseline.  Psychiatric:        Mood and Affect: Mood normal.        Behavior: Behavior normal.      UC Treatments / Results  Labs (all labs ordered are listed, but only abnormal results are displayed) Labs Reviewed - No data to  display  EKG   Radiology No results found.  Procedures Procedures (including critical care time)  Medications Ordered in UC Medications - No data to display  Initial Impression / Assessment and Plan / UC Course  I have reviewed the triage vital signs and the nursing notes.  Pertinent labs & imaging results that were available during my care of the patient were reviewed by me and considered in my medical decision making (see chart for details).     Patient presents with symptoms likely from a viral upper respiratory infection. Differential includes bacterial pneumonia, sinusitis, allergic rhinitis, COVID-19, flu, RSV. Do not suspect underlying cardiopulmonary process. Symptoms seem unlikely related to ACS, CHF or COPD exacerbations, pneumonia, pneumothorax. Patient is nontoxic appearing and not in need of emergent medical intervention.  Patient declined viral testing.  Recommended symptom control with medications and supportive care.  Patient sent prescriptions.  Return if symptoms fail to improve in 1-2 weeks or you develop shortness of breath, chest pain, severe headache. Patient states understanding and is agreeable.  Discharged with PCP followup.  Final Clinical Impressions(s) / UC Diagnoses   Final diagnoses:  Viral upper respiratory tract infection with cough     Discharge Instructions      You have a viral upper respiratory infection which should run  its course and self resolve with symptomatic treatment as we discussed.  I have prescribed you 2 different medications to help alleviate symptoms.  Please follow-up if symptoms persist or worsen.    ED Prescriptions     Medication Sig Dispense Auth. Provider   fluticasone (FLONASE) 50 MCG/ACT nasal spray Place 1 spray into both nostrils daily. 16 g , Hildred Alamin E, [place]   benzonatate (TESSALON) 100 MG capsule Take 1 capsule (100 mg total) by mouth every 8 (eight) hours as needed for cough. 21 capsule [place], Michele Rockers, [place]       PDMP not reviewed this encounter.   Teodora Medici, [place] 08/05/22 1311

## 2022-08-09 DIAGNOSIS — I129 Hypertensive chronic kidney disease with stage 1 through stage 4 chronic kidney disease, or unspecified chronic kidney disease: Secondary | ICD-10-CM | POA: Diagnosis not present

## 2022-09-12 ENCOUNTER — Ambulatory Visit
Admission: EM | Admit: 2022-09-12 | Discharge: 2022-09-12 | Disposition: A | Discharge status: Home / Self Care | Payer: Medicare Other | Attending: Physician Assistant | Admitting: Physician Assistant

## 2022-09-12 DIAGNOSIS — J069 Acute upper respiratory infection, unspecified: Secondary | ICD-10-CM

## 2022-09-12 DIAGNOSIS — J302 Other seasonal allergic rhinitis: Secondary | ICD-10-CM

## 2022-09-12 MED ORDER — PSEUDOEPH-BROMPHEN-DM 30-2-10 MG/5ML PO SYRP: 5.0000 mL | ORAL_SOLUTION | Freq: Three times a day (TID) | ORAL | 0 refills | Status: DC | PRN

## 2022-09-12 MED ORDER — FLUTICASONE PROPIONATE 50 MCG/ACT NA SUSP: 1.0000 | Freq: Every day | NASAL | 0 refills | Status: DC

## 2022-09-12 NOTE — ED Triage Notes (Signed)
Pt present cough with congestion and runny nose. Symptoms started two days ago.

## 2022-09-12 NOTE — ED Provider Notes (Signed)
[facility]    CSN: [id number] Arrival date & time: 09/12/22  [id number]      History   Chief Complaint Chief Complaint  Patient presents with   Nasal Congestion   Cough    HPI Jeffrey Holmes is a 72 y.o. male.   72 year old male presents with seasonal allergies and nasal congestion.  Patient indicates for the past several days he has been having some upper respiratory sinus and nasal congestion with postnasal drip rhinitis mainly clear production.  He also indicates he has been having some bilateral ear congestion without pain.  He has also been having chest congestion with intermittent cough without production.  He indicates he does have some postnasal drip which is causing him to have scratchy throat which has been intermittent.  He has not been taking any over-the-counter medicines.  He is without fever, and relates there has been no wheezing or shortness of breath.  He is tolerating fluids well.  Patient indicates he does use Flonase but only during the spring to help control his seasonal allergies.   Cough   Past Medical History:  Diagnosis Date   Allergy    Benign essential HTN 08/08/2015   CAD (coronary artery disease), native coronary artery 08/28/2015   Severe multivessel ASCAD s/p CABG hybrid with LIMA to LAD and diag and PCI of the RCA.    Chronic back pain    L5-S1   Chronic kidney disease, stage 3 (HCC)    multiple kidney stones   Diabetes mellitus without complication (HCC)    TYPE 2   GERD (gastroesophageal reflux disease)    OCC   H/O hiatal hernia    Hepatic cyst 11/01/2016   History of acute renal failure 2015   secondary to stones, ELEVATED CREATININE, NO DIALYSIS DONE   History of kidney stones    Hx of adenomatous colonic polyps 06/11/2017   Hyperlipidemia 08/08/2015   NAFLD (nonalcoholic fatty liver disease) 11/01/2016   PONV (postoperative nausea and vomiting)    hx of years ago    Splenic cyst 11/01/2016   Ureteral calculus 01/06/2014    Patient  Active Problem List   Diagnosis Date Noted   Kidney stone on right side 12/13/2020   AKI (acute kidney injury) ([place]) 11/17/2020   Uncontrolled type 2 diabetes mellitus with hyperglycemia ([place]) 04/05/2020   Hx of adenomatous colonic polyps 06/11/2017   Ureteral stone 03/12/2017   Diabetes ([place]) 11/11/2016   Hepatic cyst 11/01/2016   NAFLD (nonalcoholic fatty liver disease) 11/01/2016   Splenic cyst 11/01/2016   CAD (coronary artery disease), native coronary artery 11/03/2015   Abnormal result of cardiovascular function study 08/16/2015   Chronic renal insufficiency, stage III (moderate) ([place]) 08/16/2015   Benign essential HTN 08/08/2015   Hyperlipidemia 08/08/2015    Past Surgical History:  Procedure Laterality Date   ANGIOPLASTY N/A 08/21/2015   Procedure: ANGIOPLASTY WITH PERCUTANEOUS CORONARY INTERVENTION WITH DES TO RIGHT CORONARY ARTERY;  Surgeon: Jettie Booze, MD;  Location: [place];  Service: Cardiovascular;  Laterality: N/A;   BACK SURGERY  2006   microdisectomy L5-S1   CARDIAC CATHETERIZATION N/A 08/18/2015   Procedure: Left Heart Cath and Coronary Angiography;  Surgeon: Jettie Booze, MD;  Location: [place] [facility];  Service: Cardiovascular;  Laterality: N/A;   CARDIAC CATHETERIZATION N/A 08/21/2015   Procedure: Coronary Stent Intervention;  Surgeon: Jettie Booze, MD;  Location: [facility];  Service: Cardiovascular;  Laterality: N/A;   CARDIAC CATHETERIZATION  08/21/2015  Procedure: Bypass Graft Angiography;  Surgeon: Jettie Booze, MD;  Location: [place] [facility];  Service: Cardiovascular;;   CARDIOVASCULAR STRESS TEST  08/15/2015   cataract surgery      COLONOSCOPY     CORONARY ARTERY BYPASS GRAFT N/A 08/21/2015   Procedure: OFF PUMP CORONARY ARTERY BYPASS GRAFTING (CABG), TIMES TWO, USING LEFT INTERNAL MAMMARY ARTERY, RIGHT GREATER SAPHENOUS VEIN HARVESTED ENDOSCOPICALLY;  Surgeon: [name], MD;  Location: [facility];  Service: Open  Heart Surgery;  Laterality: N/A;   CYSTOSCOPY W/ URETERAL STENT PLACEMENT Right 03/12/2017   Procedure: CYSTOSCOPY WITH RETROGRADE PYELOGRAM/URETERAL STENT PLACEMENT;  Surgeon: Bjorn Loser, MD;  Location: [facility];  Service: Urology;  Laterality: Right;   CYSTOSCOPY W/ URETERAL STENT PLACEMENT Right 12/13/2020   Procedure: CYSTOSCOPY WITH RETROGRADE PYELOGRAM/URETERAL STENT PLACEMENT;  Surgeon: Franchot Gallo, MD;  Location: [facility];  Service: Urology;  Laterality: Right;   CYSTOSCOPY WITH RETROGRADE PYELOGRAM, URETEROSCOPY AND STENT PLACEMENT Right 01/06/2014   Procedure: CYSTOSCOPY WITH RETROGRADE PYELOGRAM, URETEROSCOPY AND STENT PLACEMENT;  Surgeon: Bernestine Amass, MD;  Location: [facility];  Service: Urology;  Laterality: Right;   CYSTOSCOPY/URETEROSCOPY/HOLMIUM LASER/STENT PLACEMENT Right 03/25/2017   Procedure: RIGHT URETEROSCOPY WITH HOLMIUM LASER STENT EXCHANGE;  Surgeon: [name], MD;  Location: [facility];  Service: Urology;  Laterality: Right;   CYSTOSCOPY/URETEROSCOPY/HOLMIUM LASER/STENT PLACEMENT Bilateral 11/17/2020   Procedure: CYSTOSCOPY/URETEROSCOPY/HOLMIUM LASER/STENT PLACEMENT;  Surgeon: Lucas Mallow, MD;  Location: [facility];  Service: Urology;  Laterality: Bilateral;   CYSTOSCOPY/URETEROSCOPY/HOLMIUM LASER/STENT PLACEMENT Bilateral 12/05/2020   Procedure: CYSTOSCOPY BILATERAL /URETEROSCOPY/HOLMIUM LASER/STENT PLACEMENT;  Surgeon: [name], MD;  Location: [facility];  Service: Urology;  Laterality: Bilateral;   ESOPHAGOGASTRODUODENOSCOPY ENDOSCOPY     normal   LITHOTRIPSY     2-3 times in past   TEE WITHOUT CARDIOVERSION N/A 08/21/2015   Procedure: TRANSESOPHAGEAL ECHOCARDIOGRAM (TEE);  Surgeon: [name], MD;  Location: [facility];  Service: Open Heart Surgery;  Laterality: N/A;   TONSILLECTOMY     URETEROSCOPY WITH HOLMIUM LASER LITHOTRIPSY Right 12/13/2020   Procedure: URETEROSCOPY WITH HOLMIUM LASER LITHOTRIPSY;  Surgeon: Franchot Gallo, MD;  Location[facility];   Service: Urology;  Laterality: Right;   WISDOM TOOTH EXTRACTION         Home Medications    Prior to Admission medications   Medication Sig Start Date End Date Taking? Authorizing Provider  brompheniramine-pseudoephedrine-DM 30-2-10 MG/5ML syrup Take 5 mLs by mouth 3 (three) times daily as needed. 09/12/22  Yes Nyoka Lint, PA-C  amLODipine (NORVASC) 10 MG tablet Take 1 tablet (10 mg total) by mouth daily. 08/10/21   Sueanne Margarita, MD  atorvastatin (LIPITOR) 80 MG tablet Take 1 tablet (80 mg total) by mouth daily. 07/31/22   Sueanne Margarita, MD  benzonatate (TESSALON) 100 MG capsule Take 1 capsule (100 mg total) by mouth every 8 (eight) hours as needed for cough. 08/05/22   Teodora Medici, FNP  clopidogrel (PLAVIX) 75 MG tablet Take 1 tablet by mouth once daily 07/31/22   Turner, Eber Hong, MD  Evolocumab (REPATHA SURECLICK) [id number] MG/ML SOAJ Inject 1 pen into the skin every 14 (fourteen) days. 05/08/21   Sueanne Margarita, MD  fluticasone (FLONASE) 50 MCG/ACT nasal spray Place 1 spray into both nostrils daily. 09/12/22   Nyoka Lint, PA-C  Insulin Glargine (BASAGLAR KWIKPEN) 100 UNIT/ML SOPN Inject 0.2 mLs (20 Units total) into the skin at bedtime. Patient taking differently: Inject 24 Units into the skin at bedtime. 10/10/16   Loanne Drilling,  Hilliard Clark, MD  insulin lispro (HUMALOG) 100 UNIT/ML injection Inject 10-20 Units into the skin daily as needed for high blood sugar. With meals as needed  15-20 units with meals as needed  > 150= 20 units  ? 100= 15 units    [provider]  latanoprost (XALATAN) 0.005 % ophthalmic solution Place 1 drop into both eyes at bedtime. 10/28/20   [provider]  lisinopril (ZESTRIL) 20 MG tablet Take 20 mg by mouth daily. 10/19/20   [provider]  loratadine (CLARITIN) 10 MG tablet Take 10 mg by mouth daily as needed for allergies. 09/19/18   [provider]  metoprolol succinate (TOPROL-XL) 50 MG 24 hr tablet TAKE 1 TABLET BY MOUTH TWICE DAILY  WITH MEALS OR  IMMEDIATELY  FOLLOWING  A  MEAL 07/24/22   Marylu Lund., NP  montelukast (SINGULAIR) 10 MG tablet Take 10 mg by mouth at bedtime. 12/10/18   [provider]  nitroGLYCERIN (NITROSTAT) 0.4 MG SL tablet DISSOLVE ONE TABLET UNDER THE TONGUE EVERY 5 MINUTES AS NEEDED FOR CHEST PAIN.  DO NOT EXCEED A TOTAL OF 3 DOSES IN 15 MINUTES NOW Patient taking differently: Place 0.4 mg under the tongue every 5 (five) minutes as needed for chest pain. 12/01/19   Sueanne Margarita, MD  ondansetron (ZOFRAN-ODT) 4 MG disintegrating tablet Take 1 tablet (4 mg total) by mouth every 8 (eight) hours as needed for nausea or vomiting. 11/20/21   Barrett Henle, MD  pantoprazole (PROTONIX) 40 MG tablet Take 1 tablet (40 mg total) by mouth daily before breakfast. Patient taking differently: Take 40 mg by mouth daily as needed (indigestion). 05/18/19   Gatha Mayer, MD  tadalafil (CIALIS) 10 MG tablet Take 10 mg by mouth daily as needed for erectile dysfunction. 11/09/20   [provider]  timolol (TIMOPTIC) 0.5 % ophthalmic solution Place 1 drop into both eyes 2 (two) times daily.  07/19/15   [provider]  traMADol (ULTRAM) 50 MG tablet Take 50 mg by mouth every 6 (six) hours as needed for moderate pain. 11/13/20   [provider]    Family History Family History  Problem Relation Age of Onset   Kidney failure Mother    Heart attack Mother        angina    Diabetes Mother    Throat cancer Father    Diabetes Sister    Colon cancer Neg Hx    Colon polyps Neg Hx    Rectal cancer Neg Hx    Stomach cancer Neg Hx     Social History Social History   Tobacco Use   Smoking status: Never   Smokeless tobacco: Never  Vaping Use   Vaping Use: Never used  Substance Use Topics   Alcohol use: Never   Drug use: No     Allergies   Vicodin [hydrocodone-acetaminophen], Hydrocodone, Oxycontin [oxycodone hcl], and Percocet [oxycodone-acetaminophen]   Review of  Systems Review of Systems  Respiratory:  Positive for cough.      Physical Exam Triage Vital Signs ED Triage Vitals  Enc Vitals Group     BP 09/12/22 1056 (!) 179/93     Pulse Rate 09/12/22 1056 62     Resp 09/12/22 1056 16     Temp 09/12/22 1056 98.1 F (36.7 C)     Temp Source 09/12/22 1056 Oral     SpO2 09/12/22 1056 94 %     Weight --  Height --      Head Circumference --      Peak Flow --      Pain Score 09/12/22 1059 0     Pain Loc --      Pain Edu? --      Excl. in [place]? --    No data found.  Updated Vital Signs BP (!) 179/93 (BP Location: Right Arm)   Pulse 62   Temp 98.1 F (36.7 C) (Oral)   Resp 16   SpO2 94%   Visual Acuity Right Eye Distance:   Left Eye Distance:   Bilateral Distance:    Right Eye Near:   Left Eye Near:    Bilateral Near:     Physical Exam Constitutional:      Appearance: Normal appearance.  HENT:     Right Ear: Tympanic membrane and ear canal normal.     Left Ear: Tympanic membrane and ear canal normal.     Mouth/Throat:     Mouth: Mucous membranes are moist.     Pharynx: Oropharynx is clear.  Cardiovascular:     Rate and Rhythm: Normal rate and regular rhythm.     Heart sounds: Normal heart sounds.  Pulmonary:     Effort: Pulmonary effort is normal.     Breath sounds: Normal breath sounds and air entry. No wheezing, rhonchi or rales.  Lymphadenopathy:     Cervical: No cervical adenopathy.  Neurological:     Mental Status: He is alert.      UC Treatments / Results  Labs (all labs ordered are listed, but only abnormal results are displayed) Labs Reviewed - No data to display  EKG   Radiology No results found.  Procedures Procedures (including critical care time)  Medications Ordered in UC Medications - No data to display  Initial Impression / Assessment and Plan / UC Course  I have reviewed the triage vital signs and the nursing notes.  Pertinent labs & imaging results that were available during my  care of the patient were reviewed by me and considered in my medical decision making (see chart for details).    Plan: The diagnosis will be treated with the following: 1.  Upper respiratory infection: A.  Bromfed-DM, 1 teaspoon every 8 hours to treat congestion and drainage. 2.  Seasonal allergies: A.  Flonase nasal spray, 2 sprays each nostril once daily to treat sinus congestion and drainage. 3.  Advised follow-up PCP return to urgent care as needed. Final Clinical Impressions(s) / UC Diagnoses   Final diagnoses:  Acute upper respiratory infection  Seasonal allergies     Discharge Instructions      Advised to use Flonase nasal spray, 2 sprays each nostril once daily until symptoms resolved. Advised use of Bromfed-DM, 1 teaspoon 3 times a day for the next several days until congestion improves and clears.  Advised follow-up PCP or return to urgent care as needed.    ED Prescriptions     Medication Sig Dispense Auth. Provider   fluticasone (FLONASE) 50 MCG/ACT nasal spray Place 1 spray into both nostrils daily. 16 g Nyoka Lint, PA-C   brompheniramine-pseudoephedrine-DM 30-2-10 MG/5ML syrup Take 5 mLs by mouth 3 (three) times daily as needed. 120 mL Nyoka Lint, PA-C      PDMP not reviewed this encounter.   Nyoka Lint, PA-C 09/12/22 1115

## 2022-09-12 NOTE — Discharge Instructions (Signed)
Advised to use Flonase nasal spray, 2 sprays each nostril once daily until symptoms resolved. Advised use of Bromfed-DM, 1 teaspoon 3 times a day for the next several days until congestion improves and clears.  Advised follow-up PCP or return to urgent care as needed.

## 2022-09-25 ENCOUNTER — Encounter: Payer: Self-pay | Admitting: Internal Medicine

## 2022-09-26 DIAGNOSIS — R1084 Generalized abdominal pain: Secondary | ICD-10-CM | POA: Diagnosis not present

## 2022-09-26 DIAGNOSIS — N200 Calculus of kidney: Secondary | ICD-10-CM | POA: Diagnosis not present

## 2022-10-02 NOTE — Progress Notes (Unsigned)
Office Visit    Patient Name: Jeffrey Holmes Date of Encounter: 10/03/2022  Primary Care Provider:  Pcp, No Primary Cardiologist:  Fransico Him, MD Primary Electrophysiologist: None  Chief Complaint    Jeffrey Holmes is a 72 y.o. male with PMH of CAD s/p hybrid CABG with LIMA to LAD/Diag and PCI of the RCA , HTN, HLD, CKD stage III who presents today for follow-up of CAD and HTN.   Past Medical History    Past Medical History:  Diagnosis Date   Allergy    Benign essential HTN 08/08/2015   CAD (coronary artery disease), native coronary artery 08/28/2015   Severe multivessel ASCAD s/p CABG hybrid with LIMA to LAD and diag and PCI of the RCA.    Chronic back pain    L5-S1   Chronic kidney disease, stage 3 (HCC)    multiple kidney stones   Diabetes mellitus without complication (HCC)    TYPE 2   GERD (gastroesophageal reflux disease)    OCC   H/O hiatal hernia    Hepatic cyst 11/01/2016   History of acute renal failure 2015   secondary to stones, ELEVATED CREATININE, NO DIALYSIS DONE   History of kidney stones    Hx of adenomatous colonic polyps 06/11/2017   Hyperlipidemia 08/08/2015   NAFLD (nonalcoholic fatty liver disease) 11/01/2016   PONV (postoperative nausea and vomiting)    hx of years ago    Splenic cyst 11/01/2016   Ureteral calculus 01/06/2014   Past Surgical History:  Procedure Laterality Date   ANGIOPLASTY N/A 08/21/2015   Procedure: ANGIOPLASTY WITH PERCUTANEOUS CORONARY INTERVENTION WITH DES TO RIGHT CORONARY ARTERY;  Surgeon: Jettie Booze, MD;  Location: [facility];  Service: Cardiovascular;  Laterality: N/A;   BACK SURGERY  2006   microdisectomy L5-S1   CARDIAC CATHETERIZATION N/A 08/18/2015   Procedure: Left Heart Cath and Coronary Angiography;  Surgeon: Jettie Booze, MD;  Location: [facility];  Service: Cardiovascular;  Laterality: N/A;   CARDIAC CATHETERIZATION N/A 08/21/2015   Procedure: Coronary Stent Intervention;  Surgeon: Jettie Booze, MD;   Location: [facility];  Service: Cardiovascular;  Laterality: N/A;   CARDIAC CATHETERIZATION  08/21/2015   Procedure: Bypass Graft Angiography;  Surgeon: Jettie Booze, MD;  Location: [facility];  Service: Cardiovascular;;   CARDIOVASCULAR STRESS TEST  08/15/2015   cataract surgery      COLONOSCOPY     CORONARY ARTERY BYPASS GRAFT N/A 08/21/2015   Procedure: OFF PUMP CORONARY ARTERY BYPASS GRAFTING (CABG), TIMES TWO, USING LEFT INTERNAL MAMMARY ARTERY, RIGHT GREATER SAPHENOUS VEIN HARVESTED ENDOSCOPICALLY;  Surgeon: [name], MD;  Location: [facility];  Service: Open Heart Surgery;  Laterality: N/A;   CYSTOSCOPY W/ URETERAL STENT PLACEMENT Right 03/12/2017   Procedure: CYSTOSCOPY WITH RETROGRADE PYELOGRAM/URETERAL STENT PLACEMENT;  Surgeon: Bjorn Loser, MD;  Location: [facility];  Service: Urology;  Laterality: Right;   CYSTOSCOPY W/ URETERAL STENT PLACEMENT Right 12/13/2020   Procedure: CYSTOSCOPY WITH RETROGRADE PYELOGRAM/URETERAL STENT PLACEMENT;  Surgeon: Franchot Gallo, MD;  Location: [facility];  Service: Urology;  Laterality: Right;   CYSTOSCOPY WITH RETROGRADE PYELOGRAM, URETEROSCOPY AND STENT PLACEMENT Right 01/06/2014   Procedure: CYSTOSCOPY WITH RETROGRADE PYELOGRAM, URETEROSCOPY AND STENT PLACEMENT;  Surgeon: Bernestine Amass, MD;  Location: [facility];  Service: Urology;  Laterality: Right;   CYSTOSCOPY/URETEROSCOPY/HOLMIUM LASER/STENT PLACEMENT Right 03/25/2017   Procedure: RIGHT URETEROSCOPY WITH HOLMIUM LASER STENT EXCHANGE;  Surgeon: [name], MD;  Location: [facility];  Service: Urology;  Laterality: Right;   CYSTOSCOPY/URETEROSCOPY/HOLMIUM LASER/STENT PLACEMENT Bilateral 11/17/2020   Procedure: CYSTOSCOPY/URETEROSCOPY/HOLMIUM LASER/STENT PLACEMENT;  Surgeon: Lucas Mallow, MD;  Location: [facility];  Service: Urology;  Laterality: Bilateral;   CYSTOSCOPY/URETEROSCOPY/HOLMIUM LASER/STENT PLACEMENT Bilateral 12/05/2020   Procedure: CYSTOSCOPY BILATERAL  /URETEROSCOPY/HOLMIUM LASER/STENT PLACEMENT;  Surgeon: [name], MD;  Location: [facility];  Service: Urology;  Laterality: Bilateral;   ESOPHAGOGASTRODUODENOSCOPY ENDOSCOPY     normal   LITHOTRIPSY     2-3 times in past   TEE WITHOUT CARDIOVERSION N/A 08/21/2015   Procedure: TRANSESOPHAGEAL ECHOCARDIOGRAM (TEE);  Surgeon: [name], MD;  Location: [facility];  Service: Open Heart Surgery;  Laterality: N/A;   TONSILLECTOMY     URETEROSCOPY WITH HOLMIUM LASER LITHOTRIPSY Right 12/13/2020   Procedure: URETEROSCOPY WITH HOLMIUM LASER LITHOTRIPSY;  Surgeon: Franchot Gallo, MD;  Location[facility];  Service: Urology;  Laterality: Right;   WISDOM TOOTH EXTRACTION      Allergies  Allergies  Allergen Reactions   Vicodin [Hydrocodone-Acetaminophen] Nausea And Vomiting   Hydrocodone Nausea And Vomiting   Oxycontin [Oxycodone Hcl] Nausea And Vomiting   Percocet [Oxycodone-Acetaminophen] Nausea And Vomiting    History of Present Illness    Jeffrey Holmes  is a 72 year old male with the above mention past medical history who presents today for follow-up of CAD and HTN.  [name] was initially seen in the office by Dr. Radford Pax in 2017 for complaint of chest pain and shortness of breath.  He described the pain as left-sided and occurs with physical exertion.  He completed a stress Myoview which indicated anterior ischemia and was high risk.  He underwent LHC by Dr. Irish Lack that revealed severe three-vessel CAD.  He was referred to CVTS and underwent hybrid CABG with no complications.  He was seen in follow-up and reported doing well and was participating in cardiac rehab.  He continued to do well with no complaints of chest pain or palpitations.  He was seen and 12/2018 and endorsed mild DOE with minimal exertion.  He underwent Lexiscan Myoview that was normal.  He was last seen by Dr. Radford Pax 04/2021 for follow-up.  During visit patient endorsed some anginal chest pain that occurred once last month.  He  underwent stress Myoview that revealed hypertensive BP response with exercise.  He wore a 48-hour BP monitor that revealed poor control of blood pressure.  He was started on amlodipine 5 mg daily with follow-up in hypertension clinic.  He was seen in hypertension clinic and blood pressures were still elevated and amlodipine was increased to 10 mg and patient advised to check blood pressures frequently.  Mr. Hinrichsen was seen on 07/01/2022 for annual follow-up.  During visit he was doing well from cardiac perspective however blood pressures were elevated.  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  He also noted inconsistencies with his Repatha.  He was advised to monitor blood pressures at home and was referred to hypertension clinic.  However blood pressures were not followed up since previous visit.  Mr. Ehlke presents today for 23-monthfollow-up.  Since last being seen in the office patient reports that he has not experienced any new cardiac complaints since his previous visit.  His blood pressures however have been elevated and are elevated today at 164/90 and was 142/86 on recheck.  He reports some indiscretion and medication compliance concerns.  He has not been taking his amlodipine  over the past 2 months and also is sparingly taking his lisinopril.  During today's visit we discussed at length the importance of med compliance and controlling and reducing his risk of progressing cardiovascular disease.  We also discussed the importance of blood pressure control and preventing progression of renal disease.  He is currently followed by nephrology and previous creatinine was 1.9.  He was euvolemic on exam today and denies any indiscretions with excess salt in his diet.  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  Patient denies chest pain, palpitations, dyspnea, PND, orthopnea, nausea, vomiting, dizziness, syncope,  edema, weight gain, or early satiety.  Home Medications    Current Outpatient Medications  Medication Sig Dispense Refill   atorvastatin (LIPITOR) 80 MG tablet Take 1 tablet (80 mg total) by mouth daily. 90 tablet 3   carvedilol (COREG) 3.125 MG tablet Take 1 tablet (3.125 mg total) by mouth 2 (two) times daily. 60 tablet 3   clopidogrel (PLAVIX) 75 MG tablet Take 1 tablet by mouth once daily 90 tablet 3   Evolocumab (REPATHA SURECLICK) [id number] MG/ML SOAJ Inject 1 pen into the skin every 14 (fourteen) days. 6 mL 3   Insulin Glargine (BASAGLAR KWIKPEN) 100 UNIT/ML SOPN Inject 0.2 mLs (20 Units total) into the skin at bedtime. (Patient taking differently: Inject 24 Units into the skin at bedtime.) 5 pen 11   insulin lispro (HUMALOG) 100 UNIT/ML injection Inject 10-20 Units into the skin daily as needed for high blood sugar. With meals as needed  15-20 units with meals as needed  > 150= 20 units  ? 100= 15 units     latanoprost (XALATAN) 0.005 % ophthalmic solution Place 1 drop into both eyes at bedtime.     lisinopril (ZESTRIL) 20 MG tablet Take 20 mg by mouth daily.     loratadine (CLARITIN) 10 MG tablet Take 10 mg by mouth daily as needed for allergies.     montelukast (SINGULAIR) 10 MG tablet Take 10 mg by mouth at bedtime.     nitroGLYCERIN (NITROSTAT) 0.4 MG SL tablet DISSOLVE ONE TABLET UNDER THE TONGUE EVERY 5 MINUTES AS NEEDED FOR CHEST PAIN.  DO NOT EXCEED A TOTAL OF 3 DOSES IN 15 MINUTES NOW (Patient taking differently: Place 0.4 mg under the tongue every 5 (five) minutes as needed for chest pain.) 25 tablet 1   tadalafil (CIALIS) 10 MG tablet Take 10 mg by mouth daily as needed for erectile dysfunction.     timolol (TIMOPTIC) 0.5 % ophthalmic solution Place 1 drop into both eyes 2 (two) times daily.      traMADol (ULTRAM) 50 MG tablet Take 50 mg by mouth every 6 (six) hours as needed for moderate pain.     No current facility-administered medications for this visit.     Review of  Systems  Please see the history of present illness.    (+) Anxiety  All other systems reviewed and are otherwise negative except as noted above.  Physical Exam    Wt Readings from Last 3 Encounters:  10/03/22 190 lb 3.2 oz (86.3 kg)  08/05/22 195 lb (88.5 kg)  07/01/22 198 lb 8 oz (90 kg)   VS: Vitals:   10/03/22 1006 10/03/22 1035  BP: (!) 164/90 (!) 142/86  Pulse: 64   SpO2: 97%   ,Body mass index is 31.65 kg/m.  Constitutional:      Appearance: Healthy appearance. Not in distress.  Neck:  Vascular: JVD normal.  Pulmonary:     Effort: Pulmonary effort is normal.     Breath sounds: No wheezing. No rales. Diminished in the bases Cardiovascular:     Normal rate. Regular rhythm. Normal S1. Normal S2.      Murmurs: There is no murmur.  Edema:    Peripheral edema absent.  Abdominal:     Palpations: Abdomen is soft non tender. There is no hepatomegaly.  Skin:    General: Skin is warm and dry.  Neurological:     General: No focal deficit present.     Mental Status: Alert and oriented to person, place and time.     Cranial Nerves: Cranial nerves are intact.  EKG/LABS/ Recent Cardiac Studies    ECG personally reviewed by me today -none completed today    Lab Results  Component Value Date   WBC 9.8 09/13/2021   HGB 17.3 (H) 09/13/2021   HCT 51.9 09/13/2021   MCV 91.7 09/13/2021   PLT 176 09/13/2021   Lab Results  Component Value Date   CREATININE 5.21 (H) 09/13/2021   BUN 79 (H) 09/13/2021   NA 136 09/13/2021   K 3.6 09/13/2021   CL 101 09/13/2021   CO2 23 09/13/2021   Lab Results  Component Value Date   ALT 19 07/09/2022   AST 19 07/09/2022   ALKPHOS 91 07/09/2022   BILITOT 0.3 07/09/2022   Lab Results  Component Value Date   CHOL 222 (H) 07/09/2022   HDL 44 07/09/2022   LDLCALC 137 (H) 07/09/2022   LDLDIRECT 13 07/08/2016   TRIG 226 (H) 07/09/2022   CHOLHDL 5.0 07/09/2022    Lab Results  Component Value Date   HGBA1C 8.1 (H) 11/18/2020     Cardiac Studies & Procedures   CARDIAC CATHETERIZATION  CARDIAC CATHETERIZATION 08/21/2015  Narrative  SVG to diagonal was injected and appeared to have a kink in the graft proximally causing a 90% stenosis on the angio we took during surgery. Dr. Servando Snare went back to the heart and straightened the graft.  There was a tortuous course of the LIMA graft. Dr. Servando Snare went back to the graft and straightened the LIMA graft after we took the intraoperative angiogram. The distal LAD was a small vessel with some mild diffuse disease.  Prox RCA lesion, 80% stenosed. Post intervention with a 3.5 x 16 Synergy DES, post dilated to 3.8 mm, there is a 0% residual stenosis.  Continue dual antiplatelet therapy for at least 12 months.  Continue aggressive secondary prevention.  He will be watched in the SICU post procedure.  Findings Coronary Findings Diagnostic  Dominance: Right  Left Anterior Descending The lesion is type C located at the major branch. The lesion is type C Calcified. TIMI 2 flow  Right Coronary Artery Eccentric ulcerative located at the bend.  Single Graft Graft To 2nd Diag SVG This appeared to be a kink in the graft proximally causing a 90% stenosis on the angio we took during surgery.  Dr. Servando Snare went back into the chest and straightened the graft.  LIMA Graft To Dist LAD was injected . There was a tortuous course of the LIMA graft.  Dr. Servando Snare went back to the graft and straightened teh LIMA graft after we took the intraoperative angiogram. The distal LAD was a small vessel with some mild diffuse disease.  Intervention  Prox RCA lesion PCI The pre-interventional distal flow is normal (TIMI 3). Pre-stent angioplasty was performed. A drug-eluting stent was placed. The  strut is apposed. Post-stent angioplasty was performed. The post-interventional distal flow is normal (TIMI 3). The intervention was successful. No complications occurred at this lesion. Supplies used:  BALLN EMERGE MR 3.0X15; STENT SYNERGY DES 3.5X16; BALLN [place] EMERGE MR [id number] There is a 0% residual stenosis post intervention.   CARDIAC CATHETERIZATION  CARDIAC CATHETERIZATION 08/18/2015  Narrative  Ost LAD to Mid LAD lesion, 80% stenosed.  Mid LAD lesion, 99% stenosed.  Prox RCA lesion, 70% stenosed.  Normal LVEDP  Severe LAD disease.  Percutaneous revascularization would be difficult since the ostium of the vessel is involved and there have to be stent in the left main. The mid vessel lesion is subtotally occluded and there are no collaterals which would make wire manipulation higher risk. I think a LIMA to LAD would give the patient the best long-term result.  In regards to his RCA lesion, this appears amenable to percutaneous revascularization. I've spoken to Dr. Roxan Hockey regarding a possible hybrid approach in which DES to the RCA would be performed while LIMA to LAD, potentially off-pump, could be performed.  We'll discuss this further.  Findings Coronary Findings Diagnostic  Dominance: Right  Left Anterior Descending The lesion is type C located at the major branch. The lesion is type C Calcified. TIMI 2 flow  Right Coronary Artery Eccentric ulcerative located at the bend.  Intervention  No interventions have been documented.   STRESS TESTS  MYOCARDIAL PERFUSION IMAGING 05/14/2021  Narrative   The study is normal. Findings are consistent with no prior ischemia. The study is low risk.   Patient exercised for 6 min and 0 sec. Maximum HR of 136 bpm. MPHR 91.0 %. Peak METS 7.0.  Blood pressure during peak exercise was 223/70 mmHg.   up sloping ST depression (V4 and V5) was noted.   LV perfusion is normal.   Left ventricular function is normal. Nuclear stress EF: 61 %. The left ventricular ejection fraction is normal (55-65%). End diastolic cavity size is normal.   Prior study available for comparison from 12/31/2018 - this was a Pharmacologic nuclear study with no  ischemia. Exercise capacity can not be compared.   Conclusion: No evidence of ischemia. However, there was hypertensive response to exercise.   ECHOCARDIOGRAM  ECHOCARDIOGRAM COMPLETE 08/17/2015  Narrative *[facility]* [address], [place] [address] [phone]  ------------------------------------------------------------------- Transthoracic Echocardiography  Patient:    Lonn, Luginbill MR #:       [id number] Study Date: 08/17/2015 Gender:     M Age:        15 Height:     165.1 cm Weight:     85.7 kg BSA:        2.01 m^2 Pt. Status: Room:  ORDERING     Fransico Him, MD REFERRING    Fransico Him, MD SONOGRAPHER  Marygrace Drought, RCS PERFORMING   [facility], Outpatient ATTENDING    Skeet Latch, MD  cc:  ------------------------------------------------------------------- LV EF: 55% -   60%  ------------------------------------------------------------------- Study Conclusions  - Left ventricle: There was mild hypertrophy of the posterior wall and moderate hypertrophy of the septum. Systolic function was normal. The estimated ejection fraction was in the range of 55% to 60%. Doppler parameters are consistent with abnormal left ventricular relaxation (grade 1 diastolic dysfunction). - Mitral valve: There was trivial regurgitation. - Right ventricle: The cavity size was normal. Wall thickness was normal. Systolic function was normal. - Atrial septum: No defect or patent foramen ovale was identified. - Pulmonic valve:  There was trivial regurgitation. - Inferior vena cava: The vessel was normal in size. Consistent with normal central venous pressure.  Transthoracic echocardiography.  M-mode, complete 2D, spectral Doppler, and color Doppler.  Birthdate:  Patient birthdate: 09-07-1950.  Age:  Patient is 72 yr old.  Sex:  Gender: male. BMI: 31.5 kg/m^2.  Blood pressure:     153/84  Patient status: Outpatient.  Study date:  Study date: 08/17/2015. Study time:  07:38 AM.  Location:  [facility]  -------------------------------------------------------------------  ------------------------------------------------------------------- Left ventricle:  There was mild hypertrophy of the posterior wall and moderate hypertrophy of the septum. Systolic function was normal. The estimated ejection fraction was in the range of 55% to 60%. Doppler parameters are consistent with abnormal left ventricular relaxation (grade 1 diastolic dysfunction). There was no evidence of elevated ventricular filling pressure by Doppler parameters.  ------------------------------------------------------------------- Aortic valve:   Trileaflet; mildly calcified leaflets. Cusp separation was normal.  Doppler:  Transvalvular velocity was within the normal range. There was no stenosis. There was no regurgitation.  ------------------------------------------------------------------- Mitral valve:   Structurally normal valve.   Leaflet separation was normal.  Doppler:  Transvalvular velocity was within the normal range. There was no evidence for stenosis. There was trivial regurgitation.  ------------------------------------------------------------------- Left atrium:  The atrium was normal in size.  ------------------------------------------------------------------- Atrial septum:  No defect or patent foramen ovale was identified.  ------------------------------------------------------------------- Right ventricle:  The cavity size was normal. Wall thickness was normal. Systolic function was normal.  ------------------------------------------------------------------- Pulmonic valve:    Structurally normal valve.   Cusp separation was normal.  Doppler:  Transvalvular velocity was within the normal range. There was trivial regurgitation.  ------------------------------------------------------------------- Tricuspid valve:   Structurally normal valve.   Leaflet  separation was normal.  Doppler:  Transvalvular velocity was within the normal range. There was no regurgitation.  ------------------------------------------------------------------- Right atrium:  The atrium was normal in size.  ------------------------------------------------------------------- Systemic veins: Inferior vena cava: The vessel was normal in size. Consistent with normal central venous pressure. Diameter: 14 mm.  ------------------------------------------------------------------- Measurements  IVC                                      Value        Reference ID                                       14    mm     ---------  Left ventricle                           Value        Reference LV ID, ED, PLAX chordal        (L)       36.1  mm     43 - 52 LV ID, ES, PLAX chordal                  26.3  mm     23 - 38 LV fx shortening, PLAX chordal (L)       27    %      >=29 LV PW thickness, ED                      11.8  mm     ---------  IVS/LV PW ratio, ED                      1.12         <=1.3 Stroke volume, 2D                        84    ml     --------- Stroke volume/bsa, 2D                    42    ml/m^2 --------- LV ejection fraction, 1-p A4C            65    %      --------- LV e&', lateral                           7.18  cm/s   --------- LV E/e&', lateral                         8.25         --------- LV e&', medial                            7.72  cm/s   --------- LV E/e&', medial                          7.67         --------- LV e&', average                           7.45  cm/s   --------- LV E/e&', average                         7.95         ---------  Ventricular septum                       Value        Reference IVS thickness, ED                        13.2  mm     ---------  LVOT                                     Value        Reference LVOT ID, S                               20    mm     --------- LVOT area                                3.14  cm^2    --------- LVOT peak velocity, S                    111   cm/s   --------- LVOT mean velocity, S                    72.1  cm/s   ---------  LVOT VTI, S                              26.8  cm     ---------  Aorta                                    Value        Reference Aortic root ID, ED                       36    mm     ---------  Left atrium                              Value        Reference LA ID, A-P, ES                           36    mm     --------- LA ID/bsa, A-P                           1.79  cm/m^2 <=2.2 LA volume, S                             41.7  ml     --------- LA volume/bsa, S                         20.7  ml/m^2 --------- LA volume, ES, 1-p A4C                   44.4  ml     --------- LA volume/bsa, ES, 1-p A4C               22.1  ml/m^2 --------- LA volume, ES, 1-p A2C                   37.5  ml     --------- LA volume/bsa, ES, 1-p A2C               18.6  ml/m^2 ---------  Mitral valve                             Value        Reference Mitral E-wave peak velocity              59.2  cm/s   --------- Mitral A-wave peak velocity              65.5  cm/s   --------- Mitral deceleration time       (H)       239   ms     150 - 230 Mitral E/A ratio, peak                   0.9          ---------  Right ventricle                          Value        Reference TAPSE  13.8  mm     --------- RV s&', lateral, S                        11.6  cm/s   ---------  Legend: (L)  and  (H)  mark values outside specified reference range.  ------------------------------------------------------------------- Prepared and Electronically Authenticated by  Skeet Latch, MD 2017-01-19T09:35:33   TEE  ECHO TEE 08/21/2015  Narrative *[facility]* [address], [place] [address] [phone]  ------------------------------------------------------------------- Intraoperative Transesophageal  Echocardiography  Patient:    Aaban, Spielberg MR #:       [id number] Study Date: 08/21/2015 Gender:     M Age:        19 Height: Weight: BSA: Pt. Status: Room:  ATTENDING    Lanelle Bal MD [place] MD REFERRING    Lanelle Bal MD PERFORMING   Annye Asa, MD SONOGRAPHER  Leavy Cella ADMITTING    Larae Grooms S  cc:  ------------------------------------------------------------------- LV EF: 55% -   60%  ------------------------------------------------------------------- History:   PMH:   Coronary artery disease.  ------------------------------------------------------------------- Study Conclusions  - Left ventricle: Systolic function was normal. The estimated ejection fraction was in the range of 55% to 60%. Wall motion was normal; there were no regional wall motion abnormalities. - Staged echo: Limited post-CABG and stent study: Good LV function, EF 60-65%. Normal wall motion. No change post bypass in aortic valve function. No change post bypass in mitral valve function.  Impressions:  - No significant change from pre-bypass images.  Intraoperative transesophageal echocardiography.  Birthdate: Patient birthdate: March 07, 1951.  Age:  Patient is 72 yr old.  Sex: Gender: male.  Patient status:  Inpatient.  Study date:  Study date: 08/21/2015. Study time: 10:20 AM.  Location:  Operating room.  -------------------------------------------------------------------  ------------------------------------------------------------------- Left ventricle:  Systolic function was normal. The estimated ejection fraction was in the range of 55% to 60%. Wall motion was normal; there were no regional wall motion abnormalities.  ------------------------------------------------------------------- Aortic valve:  Normal-sized, mildly calcified annulus. Trileaflet; very mildly thickened leaflet tips. Cusp separation was normal. Mobility was not  restricted.  Doppler:  Transvalvular velocity was within the normal range. There was no stenosis. There was no regurgitation.  ------------------------------------------------------------------- Aorta:  The aorta was not dilated and only mildly calcified.  ------------------------------------------------------------------- Mitral valve:   Normal-sized annulus. Structurally normal valve. Normal thickness leaflets . Leaflet separation was normal. Mobility was not restricted. No echocardiographic evidence for prolapse.  No evidence of vegetation.  Doppler:   There was no evidence for stenosis.   There was no significant regurgitation.  ------------------------------------------------------------------- Left atrium:   No evidence of thrombus in the atrial cavity or appendage.  ------------------------------------------------------------------- Atrial septum:  No defect or patent foramen ovale was identified.  ------------------------------------------------------------------- Pulmonary veins:  Visualization of the pulmonary venous anatomy is incomplete, but a significant abnormality is unlikely.  ------------------------------------------------------------------- Right ventricle:  The cavity size was normal. Wall thickness was normal. Systolic function was normal.  ------------------------------------------------------------------- Pulmonic valve:   Poorly visualized.  Doppler:  There was trivial regurgitation around the PA catheter.  ------------------------------------------------------------------- Tricuspid valve:   Structurally normal valve.   Leaflet separation was normal.  No evidence of vegetation.  Doppler:  There was trivial regurgitation.  ------------------------------------------------------------------- Pulmonary artery:   The main pulmonary artery was normal-sized.  ------------------------------------------------------------------- Right atrium:  The atrium was  normal in size.  No evidence of thrombus.  -------------------------------------------------------------------  Pre bypass:  Post bypass:  ------------------------------------------------------------------- Post procedure conclusions Left ventricle:  Limited post-CABG and stent study: Good LV function, EF 60-65%. Normal wall motion. Aortic valve:  - No change post bypass in aortic valve function.  Mitral valve:  - No change post bypass in mitral valve function.  Ascending Aorta:  - The aorta was not dilated and only mildly calcified.  ------------------------------------------------------------------- Prepared and Electronically Authenticated by  Annye Asa, MD 2017-01-23T18:43:39   [place] [facility] 11/29/2019  Narrative  Sinus bradycardia, normal sinus rhythm and sinus tachycardia. The avearge heart was 70bpm and ranged from 47 to 136bpm.           Assessment & Plan    1.  Coronary artery disease: -s/p hybrid CABG with LIMA to LAD/Diag and PCI of the RCA.  Patient underwent exercise Myoview in 2022 due to stable angina that indicated no ischemia but patient had poor exercise response with blood pressure. -Today patient reports no chest pain or anginal equivalent. -Continue current GDMT with Plavix 75 mg, Lipitor 80 mg, Repatha 140 mg, metoprolol 50 mg twice daily, and as needed Nitrostat 0.4 mg    2.  Essential hypertension: -HYPERTENSION CONTROL Vitals:   10/03/22 1006 10/03/22 1035  BP: (!) 164/90 (!) 142/86    The patient's blood pressure is elevated above target today.  In order to address the patient's elevated BP: Blood pressure will be monitored at home to determine if medication changes need to be made.; A current anti-hypertensive medication was adjusted today.; Follow up with general cardiology has been recommended.   -Patient reports some medication compliance concerns and importance was discussed at length during visit. -We  will discontinue amlodipine and Toprol-XL today. -We will start carvedilol 3.125 mg twice daily and patient will continue lisinopril 20 mg daily -We will plan to titrate lisinopril up if blood pressures are not at goal along with carvedilol. -Amlodipine was discontinued per patient due to possible renal interactions.   3.  Hyperlipidemia: -Patient's last LDL was 140 in 2022 -We will have patient return for fasting LFTs and lipids -He reports inconsistent dosing with his Repatha and was encouraged to continue normal dosing -Patient currently on Repatha and atorvastatin 80 mg   4.  CKD stage III -He is currently followed by nephrology and endocrinology. -We will plan to add SGLT2 inhibitor after having conversation with his nephrologist. -We will seek preauthorization for Jardiance and Farxiga at this time.  5.  Medication compliance -Patient reports compliance concerns and difficulty with maintaining steady regimen due to role strain with brother's illness. -He was encouraged to set reminders and purchase a pillbox for better medication compliance.     Disposition: Follow-up with Fransico Him, MD or APP in 1 months    Medication Adjustments/Labs and Tests Ordered: Current medicines are reviewed at length with the patient today.  Concerns regarding medicines are outlined above.   Signed, Mable Fill, Marissa Nestle, NP 10/03/2022, 12:37 PM [facility]  Note:  This document was prepared using Dragon voice recognition software and may include unintentional dictation errors.

## 2022-10-03 ENCOUNTER — Ambulatory Visit: Payer: Medicare Other | Attending: Nurse Practitioner | Admitting: Nurse Practitioner

## 2022-10-03 ENCOUNTER — Encounter: Payer: Self-pay | Admitting: Nurse Practitioner

## 2022-10-03 VITALS — BP 142/86 | HR 64 | Ht 65.0 in | Wt 190.2 lb

## 2022-10-03 DIAGNOSIS — E7800 Pure hypercholesterolemia, unspecified: Secondary | ICD-10-CM | POA: Diagnosis not present

## 2022-10-03 DIAGNOSIS — Z9189 Other specified personal risk factors, not elsewhere classified: Secondary | ICD-10-CM | POA: Diagnosis not present

## 2022-10-03 DIAGNOSIS — E1165 Type 2 diabetes mellitus with hyperglycemia: Secondary | ICD-10-CM

## 2022-10-03 DIAGNOSIS — N183 Chronic kidney disease, stage 3 unspecified: Secondary | ICD-10-CM

## 2022-10-03 DIAGNOSIS — I10 Essential (primary) hypertension: Secondary | ICD-10-CM | POA: Diagnosis not present

## 2022-10-03 DIAGNOSIS — I2510 Atherosclerotic heart disease of native coronary artery without angina pectoris: Secondary | ICD-10-CM | POA: Insufficient documentation

## 2022-10-03 MED ORDER — CARVEDILOL 3.125 MG PO TABS: 3.1250 mg | ORAL_TABLET | Freq: Two times a day (BID) | ORAL | 3 refills | Status: DC

## 2022-10-03 NOTE — Patient Instructions (Signed)
Medication Instructions:  STOP Amlodipine  STOP Toprol XL START Coreg 3.'125mg'$  Take 1 tablet twice a day  *If you need a refill on your cardiac medications before your next appointment, please call your pharmacy*   Lab Work: None ordered If you have labs (blood work) drawn today and your tests are completely normal, you will receive your results only by: [place] (if you have MyChart) OR A paper copy in the mail If you have any lab test that is abnormal or we need to change your treatment, we will call you to review the results.   Testing/Procedures: None ordered   Follow-Up: At [facility], you and your health needs are our priority.  As part of our continuing mission to provide you with exceptional heart care, we have created designated Provider Care Teams.  These Care Teams include your primary Cardiologist (physician) and Advanced Practice Providers (APPs -  Physician Assistants and Nurse Practitioners) who all work together to provide you with the care you need, when you need it.  We recommend signing up for the patient portal called "MyChart".  Sign up information is provided on this After Visit Summary.  MyChart is used to connect with patients for Virtual Visits (Telemedicine).  Patients are able to view lab/test results, encounter notes, upcoming appointments, etc.  Non-urgent messages can be sent to your provider as well.   To learn more about what you can do with MyChart, go to [email or web address].    Your next appointment:   1 month(s)  Provider:   Ambrose Pancoast, NP       Other Instructions Check your blood pressure daily for 2 weeks, then contact the office with your readings.

## 2022-10-14 DIAGNOSIS — N202 Calculus of kidney with calculus of ureter: Secondary | ICD-10-CM | POA: Diagnosis not present

## 2022-11-06 NOTE — Progress Notes (Addendum)
Office Visit    Patient Name: Jeffrey Holmes Date of Encounter: 11/07/2022  Primary Care Provider:  Pcp, No Primary Cardiologist:  Armanda Magicraci Turner, MD Primary Electrophysiologist: None  Chief Complaint    Jeffrey HolesKim B Engelhard is a 72 y.o. male with PMH of CAD s/p hybrid CABG with LIMA to LAD/Diag and PCI of the RCA , HTN, HLD, CKD stage III who presents today for follow-up of CAD and HTN.   Past Medical History    Past Medical History:  Diagnosis Date   Allergy    Benign essential HTN 08/08/2015   CAD (coronary artery disease), native coronary artery 08/28/2015   Severe multivessel ASCAD s/p CABG hybrid with LIMA to LAD and diag and PCI of the RCA.    Chronic back pain    L5-S1   Chronic kidney disease, stage 3    multiple kidney stones   Diabetes mellitus without complication    TYPE 2   GERD (gastroesophageal reflux disease)    OCC   H/O hiatal hernia    Hepatic cyst 11/01/2016   History of acute renal failure 2015   secondary to stones, ELEVATED CREATININE, NO DIALYSIS DONE   History of kidney stones    Hx of adenomatous colonic polyps 06/11/2017   Hyperlipidemia 08/08/2015   NAFLD (nonalcoholic fatty liver disease) 1/6/10964/12/2016   PONV (postoperative nausea and vomiting)    hx of years ago    Splenic cyst 11/01/2016   Ureteral calculus 01/06/2014   Past Surgical History:  Procedure Laterality Date   ANGIOPLASTY N/A 08/21/2015   Procedure: ANGIOPLASTY WITH PERCUTANEOUS CORONARY INTERVENTION WITH DES TO RIGHT CORONARY ARTERY;  Surgeon: Corky CraftsJayadeep S Varanasi, MD;  Location: [facility] OR;  Service: Cardiovascular;  Laterality: N/A;   BACK SURGERY  2006   microdisectomy L5-S1   CARDIAC CATHETERIZATION N/A 08/18/2015   Procedure: Left Heart Cath and Coronary Angiography;  Surgeon: Corky CraftsJayadeep S Varanasi, MD;  [facility];  Service: Cardiovascular;  Laterality: N/A;   CARDIAC CATHETERIZATION N/A 08/21/2015   Procedure: Coronary Stent Intervention;  Surgeon: Corky CraftsJayadeep S Varanasi, MD;  [facility];  Service: Cardiovascular;  Laterality: N/A;   CARDIAC CATHETERIZATION  08/21/2015   Procedure: Bypass Graft Angiography;  Surgeon: Corky CraftsJayadeep S Varanasi, MD;  [facility];  Service: Cardiovascular;;   CARDIOVASCULAR STRESS TEST  08/15/2015   cataract surgery      COLONOSCOPY     CORONARY ARTERY BYPASS GRAFT N/A 08/21/2015   Procedure: OFF PUMP CORONARY ARTERY BYPASS GRAFTING (CABG), TIMES TWO, USING LEFT INTERNAL MAMMARY ARTERY, RIGHT GREATER SAPHENOUS VEIN HARVESTED ENDOSCOPICALLY;  Surgeon: Delight OvensEdward B Gerhardt, MD;  [facility];  Service: Open Heart Surgery;  Laterality: N/A;   CYSTOSCOPY W/ URETERAL STENT PLACEMENT Right 03/12/2017   Procedure: CYSTOSCOPY WITH RETROGRADE PYELOGRAM/URETERAL STENT PLACEMENT;  Surgeon: Alfredo MartinezMacDiarmid, Scott, MD;  Location: [facility] ORS;  Service: Urology;  Laterality: Right;   CYSTOSCOPY W/ URETERAL STENT PLACEMENT Right 12/13/2020   Procedure: CYSTOSCOPY WITH RETROGRADE PYELOGRAM/URETERAL STENT PLACEMENT;  Surgeon: Marcine Matarahlstedt, Stephen, MD;  Location: [facility] ORS;  Service: Urology;  Laterality: Right;   CYSTOSCOPY WITH RETROGRADE PYELOGRAM, URETEROSCOPY AND STENT PLACEMENT Right 01/06/2014   Procedure: CYSTOSCOPY WITH RETROGRADE PYELOGRAM, URETEROSCOPY AND STENT PLACEMENT;  Surgeon: Valetta Fulleravid S Grapey, MD;  Location: [facility]S;  Service: Urology;  Laterality: Right;   CYSTOSCOPY/URETEROSCOPY/HOLMIUM LASER/STENT PLACEMENT Right 03/25/2017   Procedure: RIGHT URETEROSCOPY WITH HOLMIUM LASER STENT EXCHANGE;  Surgeon: Bjorn PippinWrenn, John, MD;  Location: [facility]S;  Service: Urology;  Laterality: Right;   CYSTOSCOPY/URETEROSCOPY/HOLMIUM LASER/STENT PLACEMENT Bilateral 11/17/2020   Procedure: CYSTOSCOPY/URETEROSCOPY/HOLMIUM LASER/STENT PLACEMENT;  Surgeon: Crista Elliot, MD;  Location: [facility];  Service: Urology;  Laterality: Bilateral;   CYSTOSCOPY/URETEROSCOPY/HOLMIUM LASER/STENT PLACEMENT Bilateral 12/05/2020   Procedure: CYSTOSCOPY BILATERAL /URETEROSCOPY/HOLMIUM  LASER/STENT PLACEMENT;  Surgeon: Bjorn Pippin, MD;  Location: [facility]S;  Service: Urology;  Laterality: Bilateral;   ESOPHAGOGASTRODUODENOSCOPY ENDOSCOPY     normal   LITHOTRIPSY     2-3 times in past   TEE WITHOUT CARDIOVERSION N/A 08/21/2015   Procedure: TRANSESOPHAGEAL ECHOCARDIOGRAM (TEE);  Surgeon: Delight Ovens, MD;  Location: [facility] OR;  Service: Open Heart Surgery;  Laterality: N/A;   TONSILLECTOMY     URETEROSCOPY WITH HOLMIUM LASER LITHOTRIPSY Right 12/13/2020   Procedure: URETEROSCOPY WITH HOLMIUM LASER LITHOTRIPSY;  Surgeon: Marcine Matar, MD;  Location: [facility]S;  Service: Urology;  Laterality: Right;   WISDOM TOOTH EXTRACTION      Allergies  Allergies  Allergen Reactions   Vicodin [Hydrocodone-Acetaminophen] Nausea And Vomiting   Hydrocodone Nausea And Vomiting   Oxycontin [Oxycodone Hcl] Nausea And Vomiting   Percocet [Oxycodone-Acetaminophen] Nausea And Vomiting    History of Present Illness    Jeffrey Holmes  is a 72 year old male with the above mention past medical history who presents today for 1 month follow-up for essential hypertension.  Jeffrey Holmes was last seen 10/03/2022 for follow-up visit.  During visit his blood pressures were elevated at 164/90 and 142/86.  He reported some indiscretions with med compliance and had not taken his amlodipine for over 2 months and sparsely taking his lisinopril.  We discontinued his amlodipine and started carvedilol 3.125 mg twice daily.  We continued his lisinopril and will plan to titrate further if blood pressures are not at goal.  Jeffrey Holmes presents today for 1 month follow-up for hypertension.  Since last being seen in the office patient reports that he is feeling well with no new complaints since previous visit.  His blood pressure today is much improved with addition of carvedilol and are 134/70.  His home blood pressures have been running in the 130s to 150s systolically.  He does report some compliance concerns and forgetting to take  his evening dose of medications.  We discussed the importance of medication management and he was provided a pillbox during today's visit.  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  He has a history of kidney stones and recently passed 2 kidney stones since his previous follow-up.  He was suffering from nausea and vomiting and had lost a total of 15 pounds.  He is planning to follow-up with his gastroenterologist.  He is currently not experiencing any GI complaints at this time.  Patient denies chest pain, palpitations, dyspnea, PND, orthopnea, nausea, vomiting, dizziness, syncope, edema, weight gain, or early satiety.   Home Medications    Current Outpatient Medications  Medication Sig Dispense Refill   atorvastatin (LIPITOR) 80 MG tablet Take 1 tablet (80 mg total) by mouth daily. 90 tablet 3   carvedilol (COREG) 3.125 MG tablet Take 1 tablet (3.125 mg total) by mouth 2 (two) times daily. 60 tablet 3   clopidogrel (PLAVIX) 75 MG tablet Take 1 tablet by mouth once daily 90 tablet 3   Evolocumab (REPATHA SURECLICK) 140 MG/ML SOAJ Inject 1 pen into the skin every 14 (fourteen) days. 6 mL 3   Insulin Glargine (BASAGLAR KWIKPEN) 100 UNIT/ML  SOPN Inject 0.2 mLs (20 Units total) into the skin at bedtime. (Patient taking differently: Inject 24 Units into the skin at bedtime.) 5 pen 11   insulin lispro (HUMALOG) 100 UNIT/ML injection Inject 10-20 Units into the skin daily as needed for high blood sugar. With meals as needed  15-20 units with meals as needed  > 150= 20 units  ? 100= 15 units     latanoprost (XALATAN) 0.005 % ophthalmic solution Place 1 drop into both eyes at bedtime.     lisinopril (ZESTRIL) 20 MG tablet Take 20 mg by mouth daily.     montelukast (SINGULAIR) 10 MG tablet Take 10 mg by mouth at bedtime.     nitroGLYCERIN (NITROSTAT) 0.4 MG SL tablet DISSOLVE ONE TABLET UNDER THE TONGUE EVERY 5 MINUTES AS NEEDED FOR CHEST PAIN.  DO NOT  EXCEED A TOTAL OF 3 DOSES IN 15 MINUTES NOW (Patient taking differently: Place 0.4 mg under the tongue every 5 (five) minutes as needed for chest pain.) 25 tablet 1   tadalafil (CIALIS) 20 MG tablet Take 20 mg by mouth daily as needed for erectile dysfunction.     timolol (TIMOPTIC) 0.5 % ophthalmic solution Place 1 drop into both eyes 2 (two) times daily.      traMADol (ULTRAM) 50 MG tablet Take 50 mg by mouth every 6 (six) hours as needed for moderate pain.     No current facility-administered medications for this visit.     Review of Systems  Please see the history of present illness.    (+) Stress (+) Anxiety  All other systems reviewed and are otherwise negative except as noted above.  Physical Exam    Wt Readings from Last 3 Encounters:  11/07/22 183 lb 12.8 oz (83.4 kg)  10/03/22 190 lb 3.2 oz (86.3 kg)  08/05/22 195 lb (88.5 kg)   VS: Vitals:   11/07/22 0951  BP: 134/70  Pulse: 70  SpO2: 97%  ,Body mass index is 30.59 kg/m.  Constitutional:      Appearance: Healthy appearance. Not in distress.  Neck:     Vascular: JVD normal.  Pulmonary:     Effort: Pulmonary effort is normal.     Breath sounds: No wheezing. No rales. Diminished in the bases Cardiovascular:     Normal rate. Regular rhythm. Normal S1. Normal S2.      Murmurs: There is no murmur.  Edema:    Peripheral edema absent.  Abdominal:     Palpations: Abdomen is soft non tender. There is no hepatomegaly.  Skin:    General: Skin is warm and dry.  Neurological:     General: No focal deficit present.     Mental Status: Alert and oriented to person, place and time.     Cranial Nerves: Cranial nerves are intact.  EKG/LABS/ Recent Cardiac Studies    ECG personally reviewed by me today -none completed today  Lab Results  Component Value Date   WBC 9.8 09/13/2021   HGB 17.3 (H) 09/13/2021   HCT 51.9 09/13/2021   MCV 91.7 09/13/2021   PLT 176 09/13/2021   Lab Results  Component Value Date    CREATININE 5.21 (H) 09/13/2021   BUN 79 (H) 09/13/2021   NA 136 09/13/2021   K 3.6 09/13/2021   CL 101 09/13/2021   CO2 23 09/13/2021   Lab Results  Component Value Date   ALT 19 07/09/2022   AST 19 07/09/2022   ALKPHOS 91 07/09/2022   BILITOT 0.3  07/09/2022   Lab Results  Component Value Date   CHOL 222 (H) 07/09/2022   HDL 44 07/09/2022   LDLCALC 137 (H) 07/09/2022   LDLDIRECT 13 07/08/2016   TRIG 226 (H) 07/09/2022   CHOLHDL 5.0 07/09/2022    Lab Results  Component Value Date   HGBA1C 8.1 (H) 11/18/2020    Cardiac Studies & Procedures   CARDIAC CATHETERIZATION  CARDIAC CATHETERIZATION 08/21/2015  Narrative  SVG to diagonal was injected and appeared to have a kink in the graft proximally causing a 90% stenosis on the angio we took during surgery. Dr. Tyrone Sage went back to the heart and straightened the graft.  There was a tortuous course of the LIMA graft. Dr. Tyrone Sage went back to the graft and straightened the LIMA graft after we took the intraoperative angiogram. The distal LAD was a small vessel with some mild diffuse disease.  Prox RCA lesion, 80% stenosed. Post intervention with a 3.5 x 16 Synergy DES, post dilated to 3.8 mm, there is a 0% residual stenosis.  Continue dual antiplatelet therapy for at least 12 months.  Continue aggressive secondary prevention.  He will be watched in the SICU post procedure.  Findings Coronary Findings Diagnostic  Dominance: Right  Left Anterior Descending The lesion is type C located at the major branch. The lesion is type C Calcified. TIMI 2 flow  Right Coronary Artery Eccentric ulcerative located at the bend.  Single Graft Graft To 2nd Diag SVG This appeared to be a kink in the graft proximally causing a 90% stenosis on the angio we took during surgery.  Dr. Tyrone Sage went back into the chest and straightened the graft.  LIMA Graft To Dist LAD was injected . There was a tortuous course of the LIMA graft.  Dr. Tyrone Sage  went back to the graft and straightened teh LIMA graft after we took the intraoperative angiogram. The distal LAD was a small vessel with some mild diffuse disease.  Intervention  Prox RCA lesion PCI The pre-interventional distal flow is normal (TIMI 3). Pre-stent angioplasty was performed. A drug-eluting stent was placed. The strut is apposed. Post-stent angioplasty was performed. The post-interventional distal flow is normal (TIMI 3). The intervention was successful. No complications occurred at this lesion. Supplies used: BALLN EMERGE MR 3.0X15; STENT SYNERGY DES 3.5X16; BALLN [place]upo EMERGE MR [id number] There is a 0% residual stenosis post intervention.   CARDIAC CATHETERIZATION  CARDIAC CATHETERIZATION 08/18/2015  Narrative  Ost LAD to Mid LAD lesion, 80% stenosed.  Mid LAD lesion, 99% stenosed.  Prox RCA lesion, 70% stenosed.  Normal LVEDP  Severe LAD disease.  Percutaneous revascularization would be difficult since the ostium of the vessel is involved and there have to be stent in the left main. The mid vessel lesion is subtotally occluded and there are no collaterals which would make wire manipulation higher risk. I think a LIMA to LAD would give the patient the best long-term result.  In regards to his RCA lesion, this appears amenable to percutaneous revascularization. I've spoken to Dr. Dorris Fetch regarding a possible hybrid approach in which DES to the RCA would be performed while LIMA to LAD, potentially off-pump, could be performed.  We'll discuss this further.  Findings Coronary Findings Diagnostic  Dominance: Right  Left Anterior Descending The lesion is type C located at the major branch. The lesion is type C Calcified. TIMI 2 flow  Right Coronary Artery Eccentric ulcerative located at the bend.  Intervention  No interventions have been documented.  STRESS TESTS  MYOCARDIAL PERFUSION IMAGING 05/14/2021  Narrative   The study is normal. Findings are  consistent with no prior ischemia. The study is low risk.   Patient exercised for 6 min and 0 sec. Maximum HR of 136 bpm. MPHR 91.0 %. Peak METS 7.0.  Blood pressure during peak exercise was 223/70 mmHg.   up sloping ST depression (V4 and V5) was noted.   LV perfusion is normal.   Left ventricular function is normal. Nuclear stress EF: 61 %. The left ventricular ejection fraction is normal (55-65%). End diastolic cavity size is normal.   Prior study available for comparison from 12/31/2018 - this was a Pharmacologic nuclear study with no ischemia. Exercise capacity can not be compared.   Conclusion: No evidence of ischemia. However, there was hypertensive response to exercise.   ECHOCARDIOGRAM  ECHOCARDIOGRAM COMPLETE 08/17/2015  Narrative *[facility]* [address] [place], [place] [address] [phone]  ------------------------------------------------------------------- Transthoracic Echocardiography  Patient:    Rodney, Yera MR #:       [id number] Study Date: 08/17/2015 Gender:     M Age:        64 Height:     165.1 cm Weight:     85.7 kg BSA:        2.01 m^2 Pt. Status: Room:  ORDERING     Armanda Magic, MD REFERRING    Armanda Magic, MD SONOGRAPHER  Clearence Ped, RCS PERFORMING   [facility], Outpatient ATTENDING    Chilton Si, MD  cc:  ------------------------------------------------------------------- LV EF: 55% -   60%  ------------------------------------------------------------------- Study Conclusions  - Left ventricle: There was mild hypertrophy of the posterior wall and moderate hypertrophy of the septum. Systolic function was normal. The estimated ejection fraction was in the range of 55% to 60%. Doppler parameters are consistent with abnormal left ventricular relaxation (grade 1 diastolic dysfunction). - Mitral valve: There was trivial regurgitation. - Right ventricle: The cavity size was normal. Wall thickness was normal. Systolic function was  normal. - Atrial septum: No defect or patent foramen ovale was identified. - Pulmonic valve: There was trivial regurgitation. - Inferior vena cava: The vessel was normal in size. Consistent with normal central venous pressure.  Transthoracic echocardiography.  M-mode, complete 2D, spectral Doppler, and color Doppler.  Birthdate:  Patient birthdate: 1951-01-27.  Age:  Patient is 72 yr old.  Sex:  Gender: male. BMI: 31.5 kg/m^2.  Blood pressure:     153/84  Patient status: Outpatient.  Study date:  Study date: 08/17/2015. Study time: 07:38 AM.  Location:  [facility]  -------------------------------------------------------------------  ------------------------------------------------------------------- Left ventricle:  There was mild hypertrophy of the posterior wall and moderate hypertrophy of the septum. Systolic function was normal. The estimated ejection fraction was in the range of 55% to 60%. Doppler parameters are consistent with abnormal left ventricular relaxation (grade 1 diastolic dysfunction). There was no evidence of elevated ventricular filling pressure by Doppler parameters.  ------------------------------------------------------------------- Aortic valve:   Trileaflet; mildly calcified leaflets. Cusp separation was normal.  Doppler:  Transvalvular velocity was within the normal range. There was no stenosis. There was no regurgitation.  ------------------------------------------------------------------- Mitral valve:   Structurally normal valve.   Leaflet separation was normal.  Doppler:  Transvalvular velocity was within the normal range. There was no evidence for stenosis. There was trivial regurgitation.  ------------------------------------------------------------------- Left atrium:  The atrium was normal in size.  ------------------------------------------------------------------- Atrial septum:  No defect or patent foramen ovale was  identified.  -------------------------------------------------------------------  Right ventricle:  The cavity size was normal. Wall thickness was normal. Systolic function was normal.  ------------------------------------------------------------------- Pulmonic valve:    Structurally normal valve.   Cusp separation was normal.  Doppler:  Transvalvular velocity was within the normal range. There was trivial regurgitation.  ------------------------------------------------------------------- Tricuspid valve:   Structurally normal valve.   Leaflet separation was normal.  Doppler:  Transvalvular velocity was within the normal range. There was no regurgitation.  ------------------------------------------------------------------- Right atrium:  The atrium was normal in size.  ------------------------------------------------------------------- Systemic veins: Inferior vena cava: The vessel was normal in size. Consistent with normal central venous pressure. Diameter: 14 mm.  ------------------------------------------------------------------- Measurements  IVC                                      Value        Reference ID                                       14    mm     ---------  Left ventricle                           Value        Reference LV ID, ED, PLAX chordal        (L)       36.1  mm     43 - 52 LV ID, ES, PLAX chordal                  26.3  mm     23 - 38 LV fx shortening, PLAX chordal (L)       27    %      >=29 LV PW thickness, ED                      11.8  mm     --------- IVS/LV PW ratio, ED                      1.12         <=1.3 Stroke volume, 2D                        84    ml     --------- Stroke volume/bsa, 2D                    42    ml/m^2 --------- LV ejection fraction, 1-p A4C            65    %      --------- LV e&', lateral                           7.18  cm/s   --------- LV E/e&', lateral                         8.25         --------- LV e&', medial                             7.72  cm/s   --------- LV E/e&',  medial                          7.67         --------- LV e&', average                           7.45  cm/s   --------- LV E/e&', average                         7.95         ---------  Ventricular septum                       Value        Reference IVS thickness, ED                        13.2  mm     ---------  LVOT                                     Value        Reference LVOT ID, S                               20    mm     --------- LVOT area                                3.14  cm^2   --------- LVOT peak velocity, S                    111   cm/s   --------- LVOT mean velocity, S                    72.1  cm/s   --------- LVOT VTI, S                              26.8  cm     ---------  Aorta                                    Value        Reference Aortic root ID, ED                       36    mm     ---------  Left atrium                              Value        Reference LA ID, A-P, ES                           36    mm     --------- LA ID/bsa, A-P                           1.79  cm/m^2 <=2.2  LA volume, S                             41.7  ml     --------- LA volume/bsa, S                         20.7  ml/m^2 --------- LA volume, ES, 1-p A4C                   44.4  ml     --------- LA volume/bsa, ES, 1-p A4C               22.1  ml/m^2 --------- LA volume, ES, 1-p A2C                   37.5  ml     --------- LA volume/bsa, ES, 1-p A2C               18.6  ml/m^2 ---------  Mitral valve                             Value        Reference Mitral E-wave peak velocity              59.2  cm/s   --------- Mitral A-wave peak velocity              65.5  cm/s   --------- Mitral deceleration time       (H)       239   ms     150 - 230 Mitral E/A ratio, peak                   0.9          ---------  Right ventricle                          Value        Reference TAPSE                                    13.8  mm     --------- RV s&',  lateral, S                        11.6  cm/s   ---------  Legend: (L)  and  (H)  mark values outside specified reference range.  ------------------------------------------------------------------- Prepared and Electronically Authenticated by  Chilton Si, MD 2017-01-19T09:35:33   TEE  ECHO TEE 08/21/2015  Narrative *[place]* *[facility]* [address] [place], [place] [address] [phone]  ------------------------------------------------------------------- Intraoperative Transesophageal Echocardiography  Patient:    Deray, Kimbro MR #:       [id number] Study Date: 08/21/2015 Gender:     M Age:        64 Height: Weight: BSA: Pt. Status: Room:  ATTENDING    Sheliah Plane MD ORDERING     Sheliah Plane MD REFERRING    Sheliah Plane MD PERFORMING   Jairo Ben, MD SONOGRAPHER  [facility] ADMITTING    Lance Muss S  cc:  ------------------------------------------------------------------- LV EF: 55% -   60%  ------------------------------------------------------------------- History:   PMH:   Coronary artery disease.  ------------------------------------------------------------------- Study Conclusions  -  Left ventricle: Systolic function was normal. The estimated ejection fraction was in the range of 55% to 60%. Wall motion was normal; there were no regional wall motion abnormalities. - Staged echo: Limited post-CABG and stent study: Good LV function, EF 60-65%. Normal wall motion. No change post bypass in aortic valve function. No change post bypass in mitral valve function.  Impressions:  - No significant change from pre-bypass images.  Intraoperative transesophageal echocardiography.  Birthdate: Patient birthdate: [birth year].  Age:  Patient is 72 yr old.  Sex: Gender: male.  Patient status:  Inpatient.  Study date:  Study date: 08/21/2015. Study time: 10:20 AM.  Location:  Operating  room.  -------------------------------------------------------------------  ------------------------------------------------------------------- Left ventricle:  Systolic function was normal. The estimated ejection fraction was in the range of 55% to 60%. Wall motion was normal; there were no regional wall motion abnormalities.  ------------------------------------------------------------------- Aortic valve:  Normal-sized, mildly calcified annulus. Trileaflet; very mildly thickened leaflet tips. Cusp separation was normal. Mobility was not restricted.  Doppler:  Transvalvular velocity was within the normal range. There was no stenosis. There was no regurgitation.  ------------------------------------------------------------------- Aorta:  The aorta was not dilated and only mildly calcified.  ------------------------------------------------------------------- Mitral valve:   Normal-sized annulus. Structurally normal valve. Normal thickness leaflets . Leaflet separation was normal. Mobility was not restricted. No echocardiographic evidence for prolapse.  No evidence of vegetation.  Doppler:   There was no evidence for stenosis.   There was no significant regurgitation.  ------------------------------------------------------------------- Left atrium:   No evidence of thrombus in the atrial cavity or appendage.  ------------------------------------------------------------------- Atrial septum:  No defect or patent foramen ovale was identified.  ------------------------------------------------------------------- Pulmonary veins:  Visualization of the pulmonary venous anatomy is incomplete, but a significant abnormality is unlikely.  ------------------------------------------------------------------- Right ventricle:  The cavity size was normal. Wall thickness was normal. Systolic function was normal.  ------------------------------------------------------------------- Pulmonic  valve:   Poorly visualized.  Doppler:  There was trivial regurgitation around the PA catheter.  ------------------------------------------------------------------- Tricuspid valve:   Structurally normal valve.   Leaflet separation was normal.  No evidence of vegetation.  Doppler:  There was trivial regurgitation.  ------------------------------------------------------------------- Pulmonary artery:   The main pulmonary artery was normal-sized.  ------------------------------------------------------------------- Right atrium:  The atrium was normal in size.  No evidence of thrombus.  ------------------------------------------------------------------- Pre bypass:  Post bypass:  ------------------------------------------------------------------- Post procedure conclusions Left ventricle:  Limited post-CABG and stent study: Good LV function, EF 60-65%. Normal wall motion. Aortic valve:  - No change post bypass in aortic valve function.  Mitral valve:  - No change post bypass in mitral valve function.  Ascending Aorta:  - The aorta was not dilated and only mildly calcified.  ------------------------------------------------------------------- Prepared and Electronically Authenticated by  Jairo Ben, MD 2017-01-23T18:43:39   MONITORS  CARDIAC EVENT MONITOR 11/29/2019  Narrative  Sinus bradycardia, normal sinus rhythm and sinus tachycardia. The avearge heart was 70bpm and ranged from 47 to 136bpm.           Assessment & Plan    1.  Essential hypertension: -Patient's blood pressure today is better controlled at 134/70. -Reviewing his blood pressure log his systolic blood pressures have also been in the 150s 160s at home. -We will increase lisinopril to 40 mg and have patient come in for BMET in 2 weeks. -Continue carvedilol 3.125 mg twice daily -He was encouraged to watch his sodium intake and make sure to continue to record blood pressures daily.  2. Coronary  artery disease: -s/p hybrid CABG with  LIMA to LAD/Diag and PCI of the RCA.  Patient underwent exercise Myoview in 2022 due to stable angina that indicated no ischemia but patient had poor exercise response with blood pressure. -Today patient reports no chest pain or anginal equivalent. -Continue current GDMT with Plavix 75 mg, Lipitor 80 mg, Repatha 140 mg, metoprolol 50 mg twice daily, and as needed Nitrostat 0.4 mg  3.Hyperlipidemia: -Patient's last LDL was 137 in 06/2022 -We will have patient return for fasting LFTs and lipids -He reports inconsistent dosing with his Repatha and was encouraged to continue normal dosing -Patient currently on Repatha and atorvastatin 80 mg    4.CKD stage III -He is currently followed by nephrology and endocrinology. -He reports recently passing 2 kidney stones.  5. Medication compliance  -During today's visit patient reports some improvement with medication compliance. -He was provided a pillbox for better medication management and organization.  6.  Surgical clearance:  The patient affirms he has been doing well without any new cardiac symptoms. They are able to achieve 5 METS without cardiac limitations. Therefore, based on ACC/AHA guidelines, the patient would be at acceptable risk for the planned procedure without further cardiovascular testing. The patient was advised that if he develops new symptoms prior to surgery to contact our office to arrange for a follow-up visit, and he verbalized understanding.    Disposition: Follow-up with Armanda Magic, MD or APP in 3 months    Medication Adjustments/Labs and Tests Ordered: Current medicines are reviewed at length with the patient today.  Concerns regarding medicines are outlined above.   Signed, Napoleon Form, Leodis Rains, NP 11/07/2022, 10:14 AM [facility]  Note:  This document was prepared using Dragon voice recognition software and may include unintentional dictation  errors.

## 2022-11-07 ENCOUNTER — Ambulatory Visit: Payer: Medicare Other | Attending: Nurse Practitioner | Admitting: Nurse Practitioner

## 2022-11-07 ENCOUNTER — Encounter: Payer: Self-pay | Admitting: Nurse Practitioner

## 2022-11-07 VITALS — BP 134/70 | HR 70 | Ht 65.0 in | Wt 183.8 lb

## 2022-11-07 DIAGNOSIS — I2510 Atherosclerotic heart disease of native coronary artery without angina pectoris: Secondary | ICD-10-CM | POA: Insufficient documentation

## 2022-11-07 DIAGNOSIS — Z9189 Other specified personal risk factors, not elsewhere classified: Secondary | ICD-10-CM | POA: Insufficient documentation

## 2022-11-07 DIAGNOSIS — I10 Essential (primary) hypertension: Secondary | ICD-10-CM | POA: Diagnosis not present

## 2022-11-07 DIAGNOSIS — N183 Chronic kidney disease, stage 3 unspecified: Secondary | ICD-10-CM | POA: Diagnosis not present

## 2022-11-07 DIAGNOSIS — E7800 Pure hypercholesterolemia, unspecified: Secondary | ICD-10-CM | POA: Diagnosis not present

## 2022-11-07 MED ORDER — LISINOPRIL 40 MG PO TABS: 40.0000 mg | ORAL_TABLET | Freq: Every day | ORAL | 1 refills | Status: DC

## 2022-11-07 NOTE — Patient Instructions (Signed)
Medication Instructions:  INCREASE Lisinopril to 40mg  Take 1 tablet once a day  *If you need a refill on your cardiac medications before your next appointment, please call your pharmacy*   Lab Work: BMET in 2 WEEKS If you have labs (blood work) drawn today and your tests are completely normal, you will receive your results only by: MyChart Message (if you have MyChart) OR A paper copy in the mail If you have any lab test that is abnormal or we need to change your treatment, we will call you to review the results.   Testing/Procedures: NONE ORDERED   Follow-Up: At [facility], you and your health needs are our priority.  As part of our continuing mission to provide you with exceptional heart care, we have created designated Provider Care Teams.  These Care Teams include your primary Cardiologist (physician) and Advanced Practice Providers (APPs -  Physician Assistants and Nurse Practitioners) who all work together to provide you with the care you need, when you need it.  We recommend signing up for the patient portal called "MyChart".  Sign up information is provided on this After Visit Summary.  MyChart is used to connect with patients for Virtual Visits (Telemedicine).  Patients are able to view lab/test results, encounter notes, upcoming appointments, etc.  Non-urgent messages can be sent to your provider as well.   To learn more about what you can do with MyChart, go to [email or web address].    Your next appointment:   3 month(s)  Provider:   Robin Searing, NP    Other Instructions GASTROPARESIS-follow up with Gastroenterology or Endocrinology

## 2022-11-12 ENCOUNTER — Ambulatory Visit (INDEPENDENT_AMBULATORY_CARE_PROVIDER_SITE_OTHER): Payer: Medicare Other | Admitting: Internal Medicine

## 2022-11-12 ENCOUNTER — Encounter: Payer: Self-pay | Admitting: Internal Medicine

## 2022-11-12 ENCOUNTER — Telehealth: Payer: Self-pay

## 2022-11-12 VITALS — BP 136/80 | HR 67 | Ht 65.0 in | Wt 186.0 lb

## 2022-11-12 DIAGNOSIS — Z7902 Long term (current) use of antithrombotics/antiplatelets: Secondary | ICD-10-CM

## 2022-11-12 DIAGNOSIS — R1013 Epigastric pain: Secondary | ICD-10-CM

## 2022-11-12 DIAGNOSIS — Z86010 Personal history of colonic polyps: Secondary | ICD-10-CM

## 2022-11-12 MED ORDER — NA SULFATE-K SULFATE-MG SULF 17.5-3.13-1.6 GM/177ML PO SOLN: 1.0000 | Freq: Once | ORAL | 0 refills | Status: AC

## 2022-11-12 NOTE — Patient Instructions (Addendum)
_______________________________________________________  If your blood pressure at your visit was 140/90 or greater, please contact your primary care physician to follow up on this.  _______________________________________________________  If you are age 73 or older, your body mass index should be between 23-30. Your Body mass index is 30.95 kg/m. If this is out of the aforementioned range listed, please consider follow up with your Primary Care Provider.  If you are age 55 or younger, your body mass index should be between 19-25. Your Body mass index is 30.95 kg/m. If this is out of the aformentioned range listed, please consider follow up with your Primary Care Provider.   ________________________________________________________  The [place] [facility] providers would like to encourage you to use [facility] to communicate with providers for non-urgent requests or questions.  Due to long hold times on the telephone, sending your provider a message by [facility] may be a faster and more efficient way to get a response.  Please allow 48 business hours for a response.  Please remember that this is for non-urgent requests.  _______________________________________________________  Jeffrey Holmes will be contacted by our office prior to your procedure for directions on holding your Plavix.  If you do not hear from our office 1 week prior to your scheduled procedure, please call [phone] to discuss.    You have been scheduled for an endoscopy and colonoscopy. Please follow the written instructions given to you at your visit today. Please pick up your prep supplies at the pharmacy within the next 1-3 days. If you use inhalers (even only as needed), please bring them with you on the day of your procedure.   Due to recent changes in healthcare laws, you may see the results of your imaging and laboratory studies on MyChart before your provider has had a chance to review them.  We understand that in some cases there may be  results that are confusing or concerning to you. Not all laboratory results come back in the same time frame and the provider may be waiting for multiple results in order to interpret others.  Please give [place] 48 hours in order for your provider to thoroughly review all the results before contacting the office for clarification of your results.   It was a pleasure to see you today!  Thank you for trusting me with your gastrointestinal care!

## 2022-11-12 NOTE — Telephone Encounter (Signed)
[facility] Pre-operative Risk Assessment     Request for surgical clearance:     Endoscopy Procedure  What type of surgery is being performed?    EGD/Colon  When is this surgery scheduled?     11-28-2022  What type of clearance is required ?   Pharmacy  Are there any medications that need to be held prior to surgery and how long? Yes, Plavix 5 days  Practice name and name of physician performing surgery?      [facility]  What is your office phone and fax number?      Phone- [phone]  Fax- [phone]  Anesthesia type (None, local, MAC, general) ?       MAC

## 2022-11-12 NOTE — Progress Notes (Signed)
Jeffrey Holmes 72 y.o. January 26, 1951 [id number]  Assessment & Plan:   Encounter Diagnoses  Name Primary?   Dyspepsia Yes   Hx of adenomatous colonic polyps    Long term current use of antithrombotics/antiplatelets - clopidogrel    Evaluate with EGD and colonoscopy.  Need to hold clopidogrel 5 days prior to procedures.  Rare but real risk of cardiac event occurring while off that medication reviewed with the patient but safer to be off this to reduce bleeding risk.  Will clarify with cardiology.  The risks and benefits as well as alternatives of endoscopic procedure(s) have been discussed and reviewed. All questions answered. The patient agrees to proceed.   Copy to Dr. Talmage Coin   Subjective:   Chief Complaint: Indigestion heartburn, sensitive stomach.  Intermittent diarrhea  HPI 72 year old African-American man with a history of colon polyps in 2019, CAD on clopidogrel status post CABG and PCI, type 2 diabetes mellitus, metabolic associated fatty liver disease, presents complaining of a lot of heartburn and indigestion.  Seems to be a problem when he eats he will often be more sensitive in the morning so he will skip breakfast.  Bowel habits are regular with some occasional diarrhea and green stools which are concerning him.  He is busy helping to care for his brother who had complications from cholecystectomy and has been in a SNF for 2 years.  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  Despite being in a SNF [name] needs to help his brother quite a bit.  This is stressful.  He does not have a primary care provider though he says Dr. Sharl Ma of endocrinology is the closest thing to it.  He says his blood sugars are under better control these days.  I had seen him back in 2020 with dyspepsia and thought that his hyperglycemia was driving some of this.  He had 2 adenomas a colonoscopy in November 18 and the plan  was for a 5-year follow-up in 2023.  He has not completed that yet.  He has never had an EGD.  Wt Readings from Last 3 Encounters:  11/12/22 186 lb (84.4 kg)  11/07/22 183 lb 12.8 oz (83.4 kg)  10/03/22 190 lb 3.2 oz (86.3 kg)     Allergies  Allergen Reactions   Vicodin [Hydrocodone-Acetaminophen] Nausea And Vomiting   Hydrocodone Nausea And Vomiting   Oxycontin [Oxycodone Hcl] Nausea And Vomiting   Percocet [Oxycodone-Acetaminophen] Nausea And Vomiting   Current Meds  Medication Sig   atorvastatin (LIPITOR) 80 MG tablet Take 1 tablet (80 mg total) by mouth daily.   carvedilol (COREG) 3.125 MG tablet Take 1 tablet (3.125 mg total) by mouth 2 (two) times daily.   clopidogrel (PLAVIX) 75 MG tablet Take 1 tablet by mouth once daily   Evolocumab (REPATHA SURECLICK) 140 MG/ML SOAJ Inject 1 pen into the skin every 14 (fourteen) days.   Insulin Glargine (BASAGLAR KWIKPEN) 100 UNIT/ML SOPN Inject 0.2 mLs (20 Units total) into the skin at bedtime. (Patient taking differently: Inject 24 Units into the skin at bedtime.)   insulin lispro (HUMALOG) 100 UNIT/ML injection Inject 10-20 Units into the skin daily as needed for high blood sugar. With meals as needed  15-20 units with meals as needed  > 150= 20 units  ? 100= 15 units   latanoprost (XALATAN) 0.005 % ophthalmic solution Place 1 drop into both eyes at bedtime.   lisinopril (  ZESTRIL) 40 MG tablet Take 1 tablet (40 mg total) by mouth daily.   montelukast (SINGULAIR) 10 MG tablet Take 10 mg by mouth at bedtime.   [EXPIRED] Na Sulfate-K Sulfate-Mg Sulf 17.5-3.13-1.6 GM/177ML SOLN Take 1 kit by mouth once for 1 dose.   tadalafil (CIALIS) 20 MG tablet Take 20 mg by mouth daily as needed for erectile dysfunction.   traMADol (ULTRAM) 50 MG tablet Take 50 mg by mouth every 6 (six) hours as needed for moderate pain.   Past Medical History:  Diagnosis Date   Allergy    Benign essential HTN 08/08/2015   CAD (coronary artery disease), native  coronary artery 08/28/2015   Severe multivessel ASCAD s/p CABG hybrid with LIMA to LAD and diag and PCI of the RCA.    Chronic back pain    L5-S1   Chronic kidney disease, stage 3    multiple kidney stones   Coronary artery disease    Diabetes    Diabetes mellitus without complication    TYPE 2   GERD (gastroesophageal reflux disease)    OCC   Glaucoma    H/O hiatal hernia    Hepatic cyst 11/01/2016   History of acute renal failure 2015   secondary to stones, ELEVATED CREATININE, NO DIALYSIS DONE   History of kidney stones    Hx of adenomatous colonic polyps 06/11/2017   Hyperlipidemia 08/08/2015   Hypertension    Kidney stones    NAFLD (nonalcoholic fatty liver disease) 16/04/9603   PONV (postoperative nausea and vomiting)    hx of years ago    Splenic cyst 11/01/2016   Ureteral calculus 01/06/2014   Past Surgical History:  Procedure Laterality Date   ANGIOPLASTY N/A 08/21/2015   Procedure: ANGIOPLASTY WITH PERCUTANEOUS CORONARY INTERVENTION WITH DES TO RIGHT CORONARY ARTERY;  Surgeon: Corky Crafts, MD;  [facility];  Service: Cardiovascular;  Laterality: N/A;   BACK SURGERY  2006   microdisectomy L5-S1   CARDIAC CATHETERIZATION N/A 08/18/2015   Procedure: Left Heart Cath and Coronary Angiography;  Surgeon: Corky Crafts, MD;  Location: [facility] INVASIVE CV LAB;  Service: Cardiovascular;  Laterality: N/A;   CARDIAC CATHETERIZATION N/A 08/21/2015   Procedure: Coronary Stent Intervention;  Surgeon: Corky Crafts, MD;  Location: [facility] INVASIVE CV LAB;  Service: Cardiovascular;  Laterality: N/A;   CARDIAC CATHETERIZATION  08/21/2015   Procedure: Bypass Graft Angiography;  Surgeon: Corky Crafts, MD;  Location[facility] INVASIVE CV LAB;  Service: Cardiovascular;;   CARDIOVASCULAR STRESS TEST  08/15/2015   cataract surgery      COLONOSCOPY     CORONARY ARTERY BYPASS GRAFT N/A 08/21/2015   Procedure: OFF PUMP CORONARY ARTERY BYPASS GRAFTING (CABG), TIMES TWO, USING LEFT  INTERNAL MAMMARY ARTERY, RIGHT GREATER SAPHENOUS VEIN HARVESTED ENDOSCOPICALLY;  Surgeon: Delight Ovens, MD;  Location: [facility] OR;  Service: Open Heart Surgery;  Laterality: N/A;   CYSTOSCOPY W/ URETERAL STENT PLACEMENT Right 03/12/2017   Procedure: CYSTOSCOPY WITH RETROGRADE PYELOGRAM/URETERAL STENT PLACEMENT;  Surgeon: Alfredo Martinez, MD;  Location: [facility];  Service: Urology;  Laterality: Right;   CYSTOSCOPY W/ URETERAL STENT PLACEMENT Right 12/13/2020   Procedure: CYSTOSCOPY WITH RETROGRADE PYELOGRAM/URETERAL STENT PLACEMENT;  Surgeon: Marcine Matar, MD;  Location: [facility];  Service: Urology;  Laterality: Right;   CYSTOSCOPY WITH RETROGRADE PYELOGRAM, URETEROSCOPY AND STENT PLACEMENT Right 01/06/2014   Procedure: CYSTOSCOPY WITH RETROGRADE PYELOGRAM, URETEROSCOPY AND STENT PLACEMENT;  Surgeon: Valetta Fuller, MD;  Location: [facility];  Service: Urology;  Laterality: Right;  CYSTOSCOPY/URETEROSCOPY/HOLMIUM LASER/STENT PLACEMENT Right 03/25/2017   Procedure: RIGHT URETEROSCOPY WITH HOLMIUM LASER STENT EXCHANGE;  Surgeon: Bjorn Pippin, MD;  Location: [facility];  Service: Urology;  Laterality: Right;   CYSTOSCOPY/URETEROSCOPY/HOLMIUM LASER/STENT PLACEMENT Bilateral 11/17/2020   Procedure: CYSTOSCOPY/URETEROSCOPY/HOLMIUM LASER/STENT PLACEMENT;  Surgeon: Crista Elliot, MD;  Location: [facility];  Service: Urology;  Laterality: Bilateral;   CYSTOSCOPY/URETEROSCOPY/HOLMIUM LASER/STENT PLACEMENT Bilateral 12/05/2020   Procedure: CYSTOSCOPY BILATERAL /URETEROSCOPY/HOLMIUM LASER/STENT PLACEMENT;  Surgeon: Bjorn Pippin, MD;  Location: [facility];  Service: Urology;  Laterality: Bilateral;   ESOPHAGOGASTRODUODENOSCOPY ENDOSCOPY     normal   LITHOTRIPSY     2-3 times in past   TEE WITHOUT CARDIOVERSION N/A 08/21/2015   Procedure: TRANSESOPHAGEAL ECHOCARDIOGRAM (TEE);  Surgeon: Delight Ovens, MD;  Location: [facility] OR;  Service: Open Heart Surgery;  Laterality: N/A;   TONSILLECTOMY     URETEROSCOPY WITH HOLMIUM LASER  LITHOTRIPSY Right 12/13/2020   Procedure: URETEROSCOPY WITH HOLMIUM LASER LITHOTRIPSY;  Surgeon: Marcine Matar, MD;  Location: [facility];  Service: Urology;  Laterality: Right;   WISDOM TOOTH EXTRACTION     Social History   Social History Narrative   Patient is married for the second time.  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  Sometimes works for temporary agencies.   Rare if any alcohol non-smoker no drug use   family history includes Diabetes in his mother and sister; Heart attack in his mother; Kidney failure in his mother; Throat cancer in his father.   Review of Systems See HPI.  Some back pain insomnia feels thirsty at times and some dyspnea.  Otherwise negative.  Objective:   Physical Exam @BP  136/80   Pulse 67   Ht 5\' 5"  (1.651 m)   Wt 186 lb (84.4 kg)   BMI 30.95 kg/m @  General:  Well-developed, well-nourished and in no acute distress Eyes:  anicteric. Lungs: Clear to auscultation bilaterally. Heart:   S1S2, no rubs, murmurs, gallops. Abdomen:  soft, non-tender, no hepatosplenomegaly, hernia, or mass and BS+.  Rectal: Deferred until colonoscopy Neuro:  A&O x 3.  Psych:  appropriate mood and  Affect.   Data Reviewed: See HPI

## 2022-11-12 NOTE — Telephone Encounter (Signed)
Left message to return call 

## 2022-11-13 ENCOUNTER — Encounter: Payer: Self-pay | Admitting: Internal Medicine

## 2022-11-13 NOTE — Telephone Encounter (Signed)
Patient returned call and instruction given to hold his Plavix. No additional questions at this time.

## 2022-11-21 ENCOUNTER — Ambulatory Visit: Payer: Medicare Other | Attending: Nurse Practitioner

## 2022-11-21 DIAGNOSIS — I10 Essential (primary) hypertension: Secondary | ICD-10-CM | POA: Diagnosis not present

## 2022-11-21 DIAGNOSIS — N183 Chronic kidney disease, stage 3 unspecified: Secondary | ICD-10-CM

## 2022-11-21 DIAGNOSIS — E7800 Pure hypercholesterolemia, unspecified: Secondary | ICD-10-CM

## 2022-11-21 DIAGNOSIS — Z9189 Other specified personal risk factors, not elsewhere classified: Secondary | ICD-10-CM

## 2022-11-21 DIAGNOSIS — I2510 Atherosclerotic heart disease of native coronary artery without angina pectoris: Secondary | ICD-10-CM | POA: Diagnosis not present

## 2022-11-22 LAB — BASIC METABOLIC PANEL
BUN/Creatinine Ratio: 10 (ref 10–24)
BUN: 21 mg/dL (ref 8–27)
CO2: 18 mmol/L — ABNORMAL LOW (ref 20–29)
Calcium: 8.9 mg/dL (ref 8.6–10.2)
Chloride: 116 mmol/L (ref 96–106)
Creatinine, Ser: 2.06 mg/dL — ABNORMAL HIGH (ref 0.76–1.27)
Glucose: 105 mg/dL — ABNORMAL HIGH (ref 70–99)
Potassium: 4.2 mmol/L (ref 3.5–5.2)
Sodium: 149 mmol/L — ABNORMAL HIGH (ref 134–144)
eGFR: 33 mL/min/{1.73_m2} — ABNORMAL LOW (ref >59)

## 2022-11-28 ENCOUNTER — Ambulatory Visit (AMBULATORY_SURGERY_CENTER): Payer: Medicare Other | Admitting: Internal Medicine

## 2022-11-28 ENCOUNTER — Encounter: Payer: Self-pay | Admitting: Internal Medicine

## 2022-11-28 VITALS — BP 167/84 | HR 54 | Temp 97.8°F | Resp 12 | Ht 65.0 in | Wt 186.0 lb

## 2022-11-28 DIAGNOSIS — K2100 Gastro-esophageal reflux disease with esophagitis, without bleeding: Secondary | ICD-10-CM

## 2022-11-28 DIAGNOSIS — K2980 Duodenitis without bleeding: Secondary | ICD-10-CM

## 2022-11-28 DIAGNOSIS — Z09 Encounter for follow-up examination after completed treatment for conditions other than malignant neoplasm: Secondary | ICD-10-CM | POA: Diagnosis not present

## 2022-11-28 DIAGNOSIS — R1013 Epigastric pain: Secondary | ICD-10-CM | POA: Diagnosis not present

## 2022-11-28 DIAGNOSIS — Z860101 Personal history of adenomatous and serrated colon polyps: Secondary | ICD-10-CM

## 2022-11-28 DIAGNOSIS — D122 Benign neoplasm of ascending colon: Secondary | ICD-10-CM

## 2022-11-28 DIAGNOSIS — K2970 Gastritis, unspecified, without bleeding: Secondary | ICD-10-CM

## 2022-11-28 DIAGNOSIS — Z86010 Personal history of colonic polyps: Secondary | ICD-10-CM

## 2022-11-28 MED ORDER — SODIUM CHLORIDE 0.9 % IV SOLN: 500.0000 mL | INTRAVENOUS | Status: DC

## 2022-11-28 NOTE — Progress Notes (Signed)
Called to room to assist during endoscopic procedure.  Patient ID and intended procedure confirmed with present staff. Received instructions for my participation in the procedure from the performing physician.  

## 2022-11-28 NOTE — Progress Notes (Signed)
Pt's states no medical or surgical changes since previsit or office visit. 

## 2022-11-28 NOTE — Progress Notes (Signed)
History and Physical Interval Note:  11/28/2022 2:12 PM  Jeffrey Holmes  has presented today for endoscopic procedure(s), with the diagnosis of  Encounter Diagnoses  Name Primary?   Dyspepsia Yes   Hx of adenomatous colonic polyps   .  The various methods of evaluation and treatment have been discussed with the patient and/or family. After consideration of risks, benefits and other options for treatment, the patient has consented to  the endoscopic procedure(s).   The patient's history has been reviewed, patient examined, no change in status, stable for endoscopic procedure(s).  I have reviewed the patient's chart and labs.  Questions were answered to the patient's satisfaction.     Iva Boop, MD, [facility]

## 2022-11-28 NOTE — Op Note (Signed)
[facility] Patient Name: Jeffrey Holmes Procedure Date: 11/28/2022 2:14 PM MRN: [id number] Endoscopist: Iva Boop , MD, [id number] Age: 72 Referring MD:  Date of Birth: [birth year] Gender: Male Account #: [id number] Procedure:                Colonoscopy Indications:              Surveillance: Personal history of adenomatous                            polyps on last colonoscopy > 5 years ago, Last                            colonoscopy: 2018 Medicines:                Monitored Anesthesia Care Procedure:                Pre-Anesthesia Assessment:                           - Prior to the procedure, a History and Physical                            was performed, and patient medications and                            allergies were reviewed. The patient's tolerance of                            previous anesthesia was also reviewed. The risks                            and benefits of the procedure and the sedation                            options and risks were discussed with the patient.                            All questions were answered, and informed consent                            was obtained. Prior Anticoagulants: The patient                            last took Plavix (clopidogrel) 5 days prior to the                            procedure. ASA Grade Assessment: III - A patient                            with severe systemic disease. After reviewing the                            risks and benefits, the patient was deemed in  satisfactory condition to undergo the procedure.                           After obtaining informed consent, the colonoscope                            was passed under direct vision. Throughout the                            procedure, the patient's blood pressure, pulse, and                            oxygen saturations were monitored continuously. The                            Olympus SN [id number] was introduced through  the anus                            and advanced to the the cecum, identified by                            appendiceal orifice and ileocecal valve. The                            colonoscopy was performed without difficulty. The                            patient tolerated the procedure well. The quality                            of the bowel preparation was good. The ileocecal                            valve, appendiceal orifice, and rectum were                            photographed. The bowel preparation used was SUPREP                            via split dose instruction. Scope In: 2:27:15 PM Scope Out: 2:38:17 PM Scope Withdrawal Time: 0 hours 6 minutes 19 seconds  Total Procedure Duration: 0 hours 11 minutes 2 seconds  Findings:                 The perianal and digital rectal examinations were                            normal.                           A diminutive polyp was found in the ascending                            colon. The polyp was sessile. The polyp was removed  with a cold snare. Resection and retrieval were                            complete. Verification of patient identification                            for the specimen was done. Estimated blood loss was                            minimal.                           Internal hemorrhoids were found.                           The exam was otherwise without abnormality on                            direct and retroflexion views. Complications:            No immediate complications. Estimated Blood Loss:     Estimated blood loss was minimal. Impression:               - One diminutive polyp in the ascending colon,                            removed with a cold snare. Resected and retrieved.                           - Internal hemorrhoids.                           - The examination was otherwise normal on direct                            and retroflexion views. Recommendation:            - Patient has a contact number available for                            emergencies. The signs and symptoms of potential                            delayed complications were discussed with the                            patient. Return to normal activities tomorrow.                            Written discharge instructions were provided to the                            patient.                           - Resume previous diet.                           -  Continue present medications.                           - Resume Plavix (clopidogrel) at prior dose                            tomorrow.                           - Await pathology results.                           - No recommendation at this time regarding repeat                            colonoscopy due to age. Iva Boop, MD 11/28/2022 2:51:23 PM This report has been signed electronically.

## 2022-11-28 NOTE — Patient Instructions (Addendum)
There was inflammation in the esophagus stomach and upper intestine seen. I took stomach biopsies to check for a possible infection that could contribute.  I will make treatment recommendations after I review the pathology.  There was a tiny colon polyp removed. I will let you know what it was. I doubt you will need a routine repeat colonoscopy.  I appreciate the opportunity to care for you. Iva Boop, MD, FACG  YOU HAD AN ENDOSCOPIC PROCEDURE TODAY AT THE [facility]:   Refer to the procedure report that was given to you for any specific questions about what was found during the examination.  If the procedure report does not answer your questions, please call your gastroenterologist to clarify.  If you requested that your care partner not be given the details of your procedure findings, then the procedure report has been included in a sealed envelope for you to review at your convenience later.  YOU SHOULD EXPECT: Some feelings of bloating in the abdomen. Passage of more gas than usual.  Walking can help get rid of the air that was put into your GI tract during the procedure and reduce the bloating. If you had a lower endoscopy (such as a colonoscopy or flexible sigmoidoscopy) you may notice spotting of blood in your stool or on the toilet paper. If you underwent a bowel prep for your procedure, you may not have a normal bowel movement for a few days.  Please Note:  You might notice some irritation and congestion in your nose or some drainage.  This is from the oxygen used during your procedure.  There is no need for concern and it should clear up in a day or so.  SYMPTOMS TO REPORT IMMEDIATELY:  Following lower endoscopy (colonoscopy or flexible sigmoidoscopy):  Excessive amounts of blood in the stool  Significant tenderness or worsening of abdominal pains  Swelling of the abdomen that is new, acute  Fever of 100F or higher  Following upper endoscopy (EGD)  Vomiting of  blood or coffee ground material  New chest pain or pain under the shoulder blades  Painful or persistently difficult swallowing  New shortness of breath  Fever of 100F or higher  Black, tarry-looking stools  For urgent or emergent issues, a gastroenterologist can be reached at any hour by calling [phone]. Do not use MyChart messaging for urgent concerns.    DIET:  We do recommend a small meal at first, but then you may proceed to your regular diet.  Drink plenty of fluids but you should avoid alcoholic beverages for 24 hours.  ACTIVITY:  You should plan to take it easy for the rest of today and you should NOT DRIVE or use heavy machinery until tomorrow (because of the sedation medicines used during the test).    FOLLOW UP: Our staff will call the number listed on your records the next business day following your procedure.  We will call around 7:15- 8:00 am to check on you and address any questions or concerns that you may have regarding the information given to you following your procedure. If we do not reach you, we will leave a message.     If any biopsies were taken you will be contacted by phone or by letter within the next 1-3 weeks.  Please call us at [phone] if you have not heard about the biopsies in 3 weeks.    SIGNATURES/CONFIDENTIALITY: You and/or your care partner have signed paperwork which will be entered  into your electronic medical record.  These signatures attest to the fact that that the information above on your After Visit Summary has been reviewed and is understood.  Full responsibility of the confidentiality of this discharge information lies with you and/or your care-partner.

## 2022-11-28 NOTE — Progress Notes (Signed)
Vss nad trans to pacu 

## 2022-11-28 NOTE — Op Note (Addendum)
[facility] Patient Name: Jeffrey Holmes Procedure Date: 11/28/2022 2:15 PM MRN: [id number] Endoscopist: Iva Boop , MD, [id number] Age: 72 Referring MD:  Date of Birth: 11/19/1950 Gender: Male Account #: [id number] Procedure:                Upper GI endoscopy Indications:              Dyspepsia Medicines:                Monitored Anesthesia Care Procedure:                Pre-Anesthesia Assessment:                           - Prior to the procedure, a History and Physical                            was performed, and patient medications and                            allergies were reviewed. The patient's tolerance of                            previous anesthesia was also reviewed. The risks                            and benefits of the procedure and the sedation                            options and risks were discussed with the patient.                            All questions were answered, and informed consent                            was obtained. Prior Anticoagulants: The patient                            last took Plavix (clopidogrel) 5 days prior to the                            procedure. ASA Grade Assessment: III - A patient                            with severe systemic disease. After reviewing the                            risks and benefits, the patient was deemed in                            satisfactory condition to undergo the procedure.                           After obtaining informed consent, the endoscope was  passed under direct vision. Throughout the                            procedure, the patient's blood pressure, pulse, and                            oxygen saturations were monitored continuously. The                            GIF [id number] #[id number] was introduced through the                            mouth, and advanced to the second part of duodenum.                            The upper GI endoscopy was accomplished  without                            difficulty. The patient tolerated the procedure                            well. Scope In: Scope Out: Findings:                 LA Grade A (one or more mucosal breaks less than 5                            mm, not extending between tops of 2 mucosal folds)                            esophagitis was found at the gastroesophageal                            junction.                           Patchy mildly erythematous mucosa was found in the                            entire examined stomach. Biopsies were taken with a                            cold forceps for histology. Verification of patient                            identification for the specimen was done. Estimated                            blood loss was minimal.                           Patchy mild inflammation characterized by                            congestion (edema), erythema and granularity was  found in the duodenal bulb.                           The gastroesophageal flap valve was visualized                            endoscopically and classified as Hill Grade III                            (minimal fold, loose to endoscope, hiatal hernia                            likely).                           The cardia and gastric fundus were normal on                            retroflexion.                           The exam was otherwise without abnormality. Complications:            No immediate complications. Estimated Blood Loss:     Estimated blood loss was minimal. Impression:               - LA Grade A reflux esophagitis.                           - Erythematous mucosa in the stomach. Biopsied.                           - Duodenitis.                           - Gastroesophageal flap valve classified as Hill                            Grade III (minimal fold, loose to endoscope, hiatal                            hernia likely).                            - The examination was otherwise normal. Recommendation:           - Patient has a contact number available for                            emergencies. The signs and symptoms of potential                            delayed complications were discussed with the                            patient. Return to normal activities tomorrow.  Written discharge instructions were provided to the                            patient.                           - Resume previous diet.                           - Continue present medications.                           - See the other procedure note for documentation of                            additional recommendations.                           - Await pathology results. Iva Boop, MD 11/28/2022 2:48:17 PM This report has been signed electronically.

## 2022-11-29 ENCOUNTER — Telehealth: Payer: Self-pay

## 2022-11-29 NOTE — Telephone Encounter (Signed)
Follow up call placed, VM obtained and message left. 

## 2022-12-11 ENCOUNTER — Telehealth: Payer: Self-pay | Admitting: Internal Medicine

## 2022-12-11 MED ORDER — PANTOPRAZOLE SODIUM 20 MG PO TBEC
20.0000 mg | DELAYED_RELEASE_TABLET | Freq: Every day | ORAL | 3 refills | Status: DC
Start: 2022-12-11 — End: 2023-10-07

## 2022-12-11 NOTE — Telephone Encounter (Signed)
I left a message that I have Rxed medication and stomach bxs ok, colon polyp benign.  Advised to call back  1) start pantoprazole 20 mgh qd for GERD (Rx sent)  2) no need for colon recall given tiny benign polyp, hx and current age  72) see me prn

## 2022-12-11 NOTE — Telephone Encounter (Signed)
Patient called to speak with nurse. Requesting a call back. Please advise, thank you.

## 2022-12-11 NOTE — Telephone Encounter (Signed)
Spoke with pt. Pt notified of recent results and Dr. Leone Payor recommendations: Pt verbalized understanding with all questions answered.

## 2023-02-04 NOTE — Progress Notes (Deleted)
Office Visit    Patient Name: Jeffrey Holmes Date of Encounter: 02/04/2023  Primary Care Provider:  Pcp, No Primary Cardiologist:  Armanda Magic, MD Primary Electrophysiologist: None   Past Medical History    Past Medical History:  Diagnosis Date   Allergy    Benign essential HTN 08/08/2015   CAD (coronary artery disease), native coronary artery 08/28/2015   Severe multivessel ASCAD s/p CABG hybrid with LIMA to LAD and diag and PCI of the RCA.    Chronic back pain    L5-S1   Chronic kidney disease, stage 3 (HCC)    multiple kidney stones   Coronary artery disease    Diabetes (HCC)    Diabetes mellitus without complication (HCC)    TYPE 2   GERD (gastroesophageal reflux disease)    OCC   Glaucoma    H/O hiatal hernia    Hepatic cyst 11/01/2016   History of acute renal failure 2015   secondary to stones, ELEVATED CREATININE, NO DIALYSIS DONE   History of kidney stones    Hx of adenomatous colonic polyps 06/11/2017   Hyperlipidemia 08/08/2015   Hypertension    Kidney stones    NAFLD (nonalcoholic fatty liver disease) 09/60/4540   PONV (postoperative nausea and vomiting)    hx of years ago    Splenic cyst 11/01/2016   Ureteral calculus 01/06/2014   Past Surgical History:  Procedure Laterality Date   ANGIOPLASTY N/A 08/21/2015   Procedure: ANGIOPLASTY WITH PERCUTANEOUS CORONARY INTERVENTION WITH DES TO RIGHT CORONARY ARTERY;  Surgeon: Corky Crafts, MD;  Location: [facility] OR;  Service: Cardiovascular;  Laterality: N/A;   BACK SURGERY  2006   microdisectomy L5-S1   CARDIAC CATHETERIZATION N/A 08/18/2015   Procedure: Left Heart Cath and Coronary Angiography;  Surgeon: Corky Crafts, MD;  Location: [facility] INVASIVE CV LAB;  Service: Cardiovascular;  Laterality: N/A;   CARDIAC CATHETERIZATION N/A 08/21/2015   Procedure: Coronary Stent Intervention;  Surgeon: Corky Crafts, MD;  Location: [facility] INVASIVE CV LAB;  Service: Cardiovascular;  Laterality: N/A;   CARDIAC  CATHETERIZATION  08/21/2015   Procedure: Bypass Graft Angiography;  Surgeon: Corky Crafts, MD;  Location: [facility] INVASIVE CV LAB;  Service: Cardiovascular;;   CARDIOVASCULAR STRESS TEST  08/15/2015   cataract surgery      COLONOSCOPY     CORONARY ARTERY BYPASS GRAFT N/A 08/21/2015   Procedure: OFF PUMP CORONARY ARTERY BYPASS GRAFTING (CABG), TIMES TWO, USING LEFT INTERNAL MAMMARY ARTERY, RIGHT GREATER SAPHENOUS VEIN HARVESTED ENDOSCOPICALLY;  Surgeon: Jeffrey Ovens, MD;  Location: [facility] OR;  Service: Open Heart Surgery;  Laterality: N/A;   CYSTOSCOPY W/ URETERAL STENT PLACEMENT Right 03/12/2017   Procedure: CYSTOSCOPY WITH RETROGRADE PYELOGRAM/URETERAL STENT PLACEMENT;  Surgeon: Alfredo Martinez, MD;  Location: [facility] ORS;  Service: Urology;  Laterality: Right;   CYSTOSCOPY W/ URETERAL STENT PLACEMENT Right 12/13/2020   Procedure: CYSTOSCOPY WITH RETROGRADE PYELOGRAM/URETERAL STENT PLACEMENT;  Surgeon: Marcine Matar, MD;  Location: [facility] ORS;  Service: Urology;  Laterality: Right;   CYSTOSCOPY WITH RETROGRADE PYELOGRAM, URETEROSCOPY AND STENT PLACEMENT Right 01/06/2014   Procedure: CYSTOSCOPY WITH RETROGRADE PYELOGRAM, URETEROSCOPY AND STENT PLACEMENT;  Surgeon: Valetta Fuller, MD;  Location: [facility];  Service: Urology;  Laterality: Right;   CYSTOSCOPY/URETEROSCOPY/HOLMIUM LASER/STENT PLACEMENT Right 03/25/2017   Procedure: RIGHT URETEROSCOPY WITH HOLMIUM LASER STENT EXCHANGE;  Surgeon: Bjorn Pippin, MD;  Location: [facility];  Service: Urology;  Laterality: Right;   CYSTOSCOPY/URETEROSCOPY/HOLMIUM LASER/STENT PLACEMENT Bilateral 11/17/2020   Procedure: CYSTOSCOPY/URETEROSCOPY/HOLMIUM LASER/STENT PLACEMENT;  Surgeon: Crista Elliot, MD;  Location: [facility];  Service: Urology;  Laterality: Bilateral;   CYSTOSCOPY/URETEROSCOPY/HOLMIUM LASER/STENT PLACEMENT Bilateral 12/05/2020   Procedure: CYSTOSCOPY BILATERAL /URETEROSCOPY/HOLMIUM LASER/STENT PLACEMENT;  Surgeon: Bjorn Pippin, MD;  Location: [facility];  Service:  Urology;  Laterality: Bilateral;   ESOPHAGOGASTRODUODENOSCOPY ENDOSCOPY     normal   LITHOTRIPSY     2-3 times in past   TEE WITHOUT CARDIOVERSION N/A 08/21/2015   Procedure: TRANSESOPHAGEAL ECHOCARDIOGRAM (TEE);  Surgeon: Jeffrey Ovens, MD;  Location: [facility] OR;  Service: Open Heart Surgery;  Laterality: N/A;   TONSILLECTOMY     URETEROSCOPY WITH HOLMIUM LASER LITHOTRIPSY Right 12/13/2020   Procedure: URETEROSCOPY WITH HOLMIUM LASER LITHOTRIPSY;  Surgeon: Marcine Matar, MD;  Location: [facility];  Service: Urology;  Laterality: Right;   WISDOM TOOTH EXTRACTION      Allergies  Allergies  Allergen Reactions   Vicodin [Hydrocodone-Acetaminophen] Nausea And Vomiting   Hydrocodone Nausea And Vomiting   Oxycontin [Oxycodone Hcl] Nausea And Vomiting   Percocet [Oxycodone-Acetaminophen] Nausea And Vomiting     History of Present Illness    Jeffrey Holmes is a 72 y.o. male with PMH of CAD s/p hybrid CABG with LIMA to LAD/Diag and PCI of the RCA , HTN, HLD, CKD stage III who presents today for  Hypertension management.  Jeffrey Holmes was initially seen in the office by Jeffrey Holmes in 2017 for complaint of chest pain and shortness of breath. He underwent LHC by Jeffrey Holmes that revealed severe three-vessel CAD.  He was referred to CVTS and underwent hybrid CABG with no complications. He was seen and 12/2018 and endorsed mild DOE with minimal exertion. He underwent Lexiscan Myoview that was normal.  Jeffrey Holmes was seen in follow-up with elevated blood pressures and amlodipine was discontinued with carvedilol initiated.  He was last seen on 11/07/2022 for surgical clearance and blood pressure was better controlled at 134/70.  We increased his lisinopril to 40 mg and continued him on carvedilol at current dose.  Since last being seen in the office patient reports***.  Patient denies chest pain, palpitations, dyspnea, PND, orthopnea, nausea, vomiting, dizziness, syncope, edema, weight gain, or early  satiety.     ***Notes:  Home Medications    Current Outpatient Medications  Medication Sig Dispense Refill   atorvastatin (LIPITOR) 80 MG tablet Take 1 tablet (80 mg total) by mouth daily. 90 tablet 3   carvedilol (COREG) 3.125 MG tablet Take 1 tablet (3.125 mg total) by mouth 2 (two) times daily. 60 tablet 3   clopidogrel (PLAVIX) 75 MG tablet Take 1 tablet by mouth once daily 90 tablet 3   Evolocumab (REPATHA SURECLICK) 140 MG/ML SOAJ Inject 1 pen into the skin every 14 (fourteen) days. 6 mL 3   Insulin Glargine (BASAGLAR KWIKPEN) 100 UNIT/ML SOPN Inject 0.2 mLs (20 Units total) into the skin at bedtime. (Patient taking differently: Inject 24 Units into the skin at bedtime.) 5 pen 11   insulin lispro (HUMALOG) 100 UNIT/ML injection Inject 10-20 Units into the skin daily as needed for high blood sugar. With meals as needed  15-20 units with meals as needed  > 150= 20 units  ? 100= 15 units     latanoprost (XALATAN) 0.005 % ophthalmic solution Place 1 drop into both eyes at bedtime.     lisinopril (ZESTRIL) 40 MG tablet Take 1 tablet (40 mg total) by mouth daily. 90 tablet 1   montelukast (SINGULAIR) 10 MG tablet Take 10 mg by mouth  at bedtime.     nitroGLYCERIN (NITROSTAT) 0.4 MG SL tablet DISSOLVE ONE TABLET UNDER THE TONGUE EVERY 5 MINUTES AS NEEDED FOR CHEST PAIN.  DO NOT EXCEED A TOTAL OF 3 DOSES IN 15 MINUTES NOW (Patient not taking: Reported on 11/12/2022) 25 tablet 1   pantoprazole (PROTONIX) 20 MG tablet Take 1 tablet (20 mg total) by mouth daily before breakfast. 90 tablet 3   tadalafil (CIALIS) 20 MG tablet Take 20 mg by mouth daily as needed for erectile dysfunction.     timolol (TIMOPTIC) 0.5 % ophthalmic solution Place 1 drop into both eyes 2 (two) times daily.     traMADol (ULTRAM) 50 MG tablet Take 50 mg by mouth every 6 (six) hours as needed for moderate pain. (Patient not taking: Reported on 11/28/2022)     No current facility-administered medications for this visit.      Review of Systems  Please see the history of present illness.    (+)*** (+)***  All other systems reviewed and are otherwise negative except as noted above.  Physical Exam    Wt Readings from Last 3 Encounters:  11/28/22 186 lb (84.4 kg)  11/12/22 186 lb (84.4 kg)  11/07/22 183 lb 12.8 oz (83.4 kg)   ZO:XWRUE were no vitals filed for this visit.,There is no height or weight on file to calculate BMI.  Constitutional:      Appearance: Healthy appearance. Not in distress.  Neck:     Vascular: JVD normal.  Pulmonary:     Effort: Pulmonary effort is normal.     Breath sounds: No wheezing. No rales. Diminished in the bases Cardiovascular:     Normal rate. Regular rhythm. Normal S1. Normal S2.      Murmurs: There is no murmur.  Edema:    Peripheral edema absent.  Abdominal:     Palpations: Abdomen is soft non tender. There is no hepatomegaly.  Skin:    General: Skin is warm and dry.  Neurological:     General: No focal deficit present.     Mental Status: Alert and oriented to person, place and time.     Cranial Nerves: Cranial nerves are intact.  EKG/LABS/ Recent Cardiac Studies    ECG personally reviewed by me today - ***   Risk Assessment/Calculations:   {Does this patient have ATRIAL FIBRILLATION?[phone]}        Lab Results  Component Value Date   WBC 9.8 09/13/2021   HGB 17.3 (H) 09/13/2021   HCT 51.9 09/13/2021   MCV 91.7 09/13/2021   PLT 176 09/13/2021   Lab Results  Component Value Date   CREATININE 2.06 (H) 11/21/2022   BUN 21 11/21/2022   NA 149 (H) 11/21/2022   K 4.2 11/21/2022   CL 116 (HH) 11/21/2022   CO2 18 (L) 11/21/2022   Lab Results  Component Value Date   ALT 19 07/09/2022   AST 19 07/09/2022   ALKPHOS 91 07/09/2022   BILITOT 0.3 07/09/2022   Lab Results  Component Value Date   CHOL 222 (H) 07/09/2022   HDL 44 07/09/2022   LDLCALC 137 (H) 07/09/2022   LDLDIRECT 13 07/08/2016   TRIG 226 (H) 07/09/2022   CHOLHDL 5.0  07/09/2022    Lab Results  Component Value Date   HGBA1C 8.1 (H) 11/18/2020     Assessment & Plan    1.Essential hypertension: -Patient's blood pressure today is   2.Coronary artery disease: -s/p hybrid CABG with LIMA to LAD/Diag and PCI of the  RCA.  Patient underwent exercise Myoview in 2022  3.Hyperlipidemia: -Patient's last LDL was   4.CKD stage III -He is currently followed by nephrology and endocrinology      Disposition: Follow-up with Armanda Magic, MD or APP in *** months {Are you ordering a CV Procedure (e.g. stress test, cath, DCCV, TEE, etc)?   Press F2        :[id number]}   Medication Adjustments/Labs and Tests Ordered: Current medicines are reviewed at length with the patient today.  Concerns regarding medicines are outlined above.   Signed, Napoleon Form, Leodis Rains, NP 02/04/2023, 1:03 PM [facility]

## 2023-02-05 ENCOUNTER — Ambulatory Visit: Payer: Medicare Other | Attending: Nurse Practitioner | Admitting: Nurse Practitioner

## 2023-02-05 DIAGNOSIS — E7800 Pure hypercholesterolemia, unspecified: Secondary | ICD-10-CM

## 2023-02-05 DIAGNOSIS — N183 Chronic kidney disease, stage 3 unspecified: Secondary | ICD-10-CM

## 2023-02-05 DIAGNOSIS — I10 Essential (primary) hypertension: Secondary | ICD-10-CM

## 2023-02-05 DIAGNOSIS — Z9189 Other specified personal risk factors, not elsewhere classified: Secondary | ICD-10-CM

## 2023-02-05 DIAGNOSIS — I2510 Atherosclerotic heart disease of native coronary artery without angina pectoris: Secondary | ICD-10-CM

## 2023-02-06 DIAGNOSIS — R5383 Other fatigue: Secondary | ICD-10-CM | POA: Diagnosis not present

## 2023-02-06 DIAGNOSIS — I2510 Atherosclerotic heart disease of native coronary artery without angina pectoris: Secondary | ICD-10-CM | POA: Diagnosis not present

## 2023-02-06 DIAGNOSIS — E1151 Type 2 diabetes mellitus with diabetic peripheral angiopathy without gangrene: Secondary | ICD-10-CM | POA: Diagnosis not present

## 2023-02-06 DIAGNOSIS — N1832 Chronic kidney disease, stage 3b: Secondary | ICD-10-CM | POA: Diagnosis not present

## 2023-02-06 DIAGNOSIS — I10 Essential (primary) hypertension: Secondary | ICD-10-CM | POA: Diagnosis not present

## 2023-02-06 DIAGNOSIS — Z8349 Family history of other endocrine, nutritional and metabolic diseases: Secondary | ICD-10-CM | POA: Diagnosis not present

## 2023-02-06 DIAGNOSIS — E039 Hypothyroidism, unspecified: Secondary | ICD-10-CM | POA: Diagnosis not present

## 2023-02-06 DIAGNOSIS — Z794 Long term (current) use of insulin: Secondary | ICD-10-CM | POA: Diagnosis not present

## 2023-02-06 DIAGNOSIS — E785 Hyperlipidemia, unspecified: Secondary | ICD-10-CM | POA: Diagnosis not present

## 2023-03-07 DIAGNOSIS — K219 Gastro-esophageal reflux disease without esophagitis: Secondary | ICD-10-CM | POA: Diagnosis not present

## 2023-03-07 DIAGNOSIS — H1045 Other chronic allergic conjunctivitis: Secondary | ICD-10-CM | POA: Diagnosis not present

## 2023-03-07 DIAGNOSIS — J3089 Other allergic rhinitis: Secondary | ICD-10-CM | POA: Diagnosis not present

## 2023-03-10 DIAGNOSIS — N281 Cyst of kidney, acquired: Secondary | ICD-10-CM | POA: Diagnosis not present

## 2023-03-10 DIAGNOSIS — N200 Calculus of kidney: Secondary | ICD-10-CM | POA: Diagnosis not present

## 2023-03-25 NOTE — Progress Notes (Unsigned)
Office Visit    Patient Name: Jeffrey Holmes Date of Encounter: 03/25/2023  Primary Care Provider:  Pcp, No Primary Cardiologist:  Armanda Magic, MD Primary Electrophysiologist: None   Past Medical History    Past Medical History:  Diagnosis Date   Allergy    Benign essential HTN 08/08/2015   CAD (coronary artery disease), native coronary artery 08/28/2015   Severe multivessel ASCAD s/p CABG hybrid with LIMA to LAD and diag and PCI of the RCA.    Chronic back pain    L5-S1   Chronic kidney disease, stage 3 (HCC)    multiple kidney stones   Coronary artery disease    Diabetes (HCC)    Diabetes mellitus without complication (HCC)    TYPE 2   GERD (gastroesophageal reflux disease)    OCC   Glaucoma    H/O hiatal hernia    Hepatic cyst 11/01/2016   History of acute renal failure 2015   secondary to stones, ELEVATED CREATININE, NO DIALYSIS DONE   History of kidney stones    Hx of adenomatous colonic polyps 06/11/2017   Hyperlipidemia 08/08/2015   Hypertension    Kidney stones    NAFLD (nonalcoholic fatty liver disease) 13/02/6577   PONV (postoperative nausea and vomiting)    hx of years ago    Splenic cyst 11/01/2016   Ureteral calculus 01/06/2014   Past Surgical History:  Procedure Laterality Date   ANGIOPLASTY N/A 08/21/2015   Procedure: ANGIOPLASTY WITH PERCUTANEOUS CORONARY INTERVENTION WITH DES TO RIGHT CORONARY ARTERY;  Surgeon: Corky Crafts, MD;  Location: [facility] OR;  Service: Cardiovascular;  Laterality: N/A;   BACK SURGERY  2006   microdisectomy L5-S1   CARDIAC CATHETERIZATION N/A 08/18/2015   Procedure: Left Heart Cath and Coronary Angiography;  Surgeon: Corky Crafts, MD;  Location: [facility] INVASIVE CV LAB;  Service: Cardiovascular;  Laterality: N/A;   CARDIAC CATHETERIZATION N/A 08/21/2015   Procedure: Coronary Stent Intervention;  Surgeon: Corky Crafts, MD;  Location: [facility] INVASIVE CV LAB;  Service: Cardiovascular;  Laterality: N/A;   CARDIAC  CATHETERIZATION  08/21/2015   Procedure: Bypass Graft Angiography;  Surgeon: Corky Crafts, MD;  Location: [facility] INVASIVE CV LAB;  Service: Cardiovascular;;   CARDIOVASCULAR STRESS TEST  08/15/2015   cataract surgery      COLONOSCOPY     CORONARY ARTERY BYPASS GRAFT N/A 08/21/2015   Procedure: OFF PUMP CORONARY ARTERY BYPASS GRAFTING (CABG), TIMES TWO, USING LEFT INTERNAL MAMMARY ARTERY, RIGHT GREATER SAPHENOUS VEIN HARVESTED ENDOSCOPICALLY;  Surgeon: Delight Ovens, MD;  Location: [facility] OR;  Service: Open Heart Surgery;  Laterality: N/A;   CYSTOSCOPY W/ URETERAL STENT PLACEMENT Right 03/12/2017   Procedure: CYSTOSCOPY WITH RETROGRADE PYELOGRAM/URETERAL STENT PLACEMENT;  Surgeon: Alfredo Martinez, MD;  Location: [facility];  Service: Urology;  Laterality: Right;   CYSTOSCOPY W/ URETERAL STENT PLACEMENT Right 12/13/2020   Procedure: CYSTOSCOPY WITH RETROGRADE PYELOGRAM/URETERAL STENT PLACEMENT;  Surgeon: Marcine Matar, MD;  Location: [facility];  Service: Urology;  Laterality: Right;   CYSTOSCOPY WITH RETROGRADE PYELOGRAM, URETEROSCOPY AND STENT PLACEMENT Right 01/06/2014   Procedure: CYSTOSCOPY WITH RETROGRADE PYELOGRAM, URETEROSCOPY AND STENT PLACEMENT;  Surgeon: Valetta Fuller, MD;  Location: [facility];  Service: Urology;  Laterality: Right;   CYSTOSCOPY/URETEROSCOPY/HOLMIUM LASER/STENT PLACEMENT Right 03/25/2017   Procedure: RIGHT URETEROSCOPY WITH HOLMIUM LASER STENT EXCHANGE;  Surgeon: Bjorn Pippin, MD;  Location: [facility];  Service: Urology;  Laterality: Right;   CYSTOSCOPY/URETEROSCOPY/HOLMIUM LASER/STENT PLACEMENT Bilateral 11/17/2020   Procedure: CYSTOSCOPY/URETEROSCOPY/HOLMIUM LASER/STENT PLACEMENT;  Surgeon: Crista Elliot, MD;  Location: [facility];  Service: Urology;  Laterality: Bilateral;   CYSTOSCOPY/URETEROSCOPY/HOLMIUM LASER/STENT PLACEMENT Bilateral 12/05/2020   Procedure: CYSTOSCOPY BILATERAL /URETEROSCOPY/HOLMIUM LASER/STENT PLACEMENT;  Surgeon: Bjorn Pippin, MD;  Location: [facility];  Service:  Urology;  Laterality: Bilateral;   ESOPHAGOGASTRODUODENOSCOPY ENDOSCOPY     normal   LITHOTRIPSY     2-3 times in past   TEE WITHOUT CARDIOVERSION N/A 08/21/2015   Procedure: TRANSESOPHAGEAL ECHOCARDIOGRAM (TEE);  Surgeon: Delight Ovens, MD;  Location: [facility] OR;  Service: Open Heart Surgery;  Laterality: N/A;   TONSILLECTOMY     URETEROSCOPY WITH HOLMIUM LASER LITHOTRIPSY Right 12/13/2020   Procedure: URETEROSCOPY WITH HOLMIUM LASER LITHOTRIPSY;  Surgeon: Marcine Matar, MD;  Location: [facility];  Service: Urology;  Laterality: Right;   WISDOM TOOTH EXTRACTION      Allergies  Allergies  Allergen Reactions   Vicodin [Hydrocodone-Acetaminophen] Nausea And Vomiting   Hydrocodone Nausea And Vomiting   Oxycontin [Oxycodone Hcl] Nausea And Vomiting   Percocet [Oxycodone-Acetaminophen] Nausea And Vomiting     History of Present Illness    Jeffrey Holmes is a 72 y.o. male with PMH of CAD s/p hybrid CABG with LIMA to LAD/Diag and PCI of the RCA , HTN, HLD, CKD stage III who presents today for follow-up of CAD and HTN.   Jeffrey Holmes was initially seen in the office by Dr. Mayford Knife in 2017 for complaint of chest pain and shortness of breath. He underwent LHC by Dr. Eldridge Dace that revealed severe three-vessel CAD.  He was referred to CVTS and underwent hybrid CABG with no complications. He was seen and 12/2018 and endorsed mild DOE with minimal exertion. He underwent Lexiscan Myoview that was normal.  Jeffrey Holmes was seen in follow-up with elevated blood pressures and amlodipine was discontinued with carvedilol initiated.  He was last seen on 11/07/2022 for surgical clearance and blood pressure was better controlled at 134/70.  We increased his lisinopril to 40 mg and continued him on carvedilol at current dose.   Since last being seen in the office patient reports***.  Patient denies chest pain, palpitations, dyspnea, PND, orthopnea, nausea, vomiting, dizziness, syncope, edema, weight gain, or early  satiety.  ***Notes:  Home Medications    Current Outpatient Medications  Medication Sig Dispense Refill   atorvastatin (LIPITOR) 80 MG tablet Take 1 tablet (80 mg total) by mouth daily. 90 tablet 3   carvedilol (COREG) 3.125 MG tablet Take 1 tablet (3.125 mg total) by mouth 2 (two) times daily. 60 tablet 3   clopidogrel (PLAVIX) 75 MG tablet Take 1 tablet by mouth once daily 90 tablet 3   Evolocumab (REPATHA SURECLICK) 140 MG/ML SOAJ Inject 1 pen into the skin every 14 (fourteen) days. 6 mL 3   Insulin Glargine (BASAGLAR KWIKPEN) 100 UNIT/ML SOPN Inject 0.2 mLs (20 Units total) into the skin at bedtime. (Patient taking differently: Inject 24 Units into the skin at bedtime.) 5 pen 11   insulin lispro (HUMALOG) 100 UNIT/ML injection Inject 10-20 Units into the skin daily as needed for high blood sugar. With meals as needed  15-20 units with meals as needed  > 150= 20 units  ? 100= 15 units     latanoprost (XALATAN) 0.005 % ophthalmic solution Place 1 drop into both eyes at bedtime.     lisinopril (ZESTRIL) 40 MG tablet Take 1 tablet (40 mg total) by mouth daily. 90 tablet 1   montelukast (SINGULAIR) 10 MG tablet Take 10 mg by  mouth at bedtime.     nitroGLYCERIN (NITROSTAT) 0.4 MG SL tablet DISSOLVE ONE TABLET UNDER THE TONGUE EVERY 5 MINUTES AS NEEDED FOR CHEST PAIN.  DO NOT EXCEED A TOTAL OF 3 DOSES IN 15 MINUTES NOW (Patient not taking: Reported on 11/12/2022) 25 tablet 1   pantoprazole (PROTONIX) 20 MG tablet Take 1 tablet (20 mg total) by mouth daily before breakfast. 90 tablet 3   tadalafil (CIALIS) 20 MG tablet Take 20 mg by mouth daily as needed for erectile dysfunction.     timolol (TIMOPTIC) 0.5 % ophthalmic solution Place 1 drop into both eyes 2 (two) times daily.     traMADol (ULTRAM) 50 MG tablet Take 50 mg by mouth every 6 (six) hours as needed for moderate pain. (Patient not taking: Reported on 11/28/2022)     No current facility-administered medications for this visit.      Review of Systems  Please see the history of present illness.    (+)*** (+)***  All other systems reviewed and are otherwise negative except as noted above.  Physical Exam    Wt Readings from Last 3 Encounters:  11/28/22 186 lb (84.4 kg)  11/12/22 186 lb (84.4 kg)  11/07/22 183 lb 12.8 oz (83.4 kg)   ZO:XWRUE were no vitals filed for this visit.,There is no height or weight on file to calculate BMI.  Constitutional:      Appearance: Healthy appearance. Not in distress.  Neck:     Vascular: JVD normal.  Pulmonary:     Effort: Pulmonary effort is normal.     Breath sounds: No wheezing. No rales. Diminished in the bases Cardiovascular:     Normal rate. Regular rhythm. Normal S1. Normal S2.      Murmurs: There is no murmur.  Edema:    Peripheral edema absent.  Abdominal:     Palpations: Abdomen is soft non tender. There is no hepatomegaly.  Skin:    General: Skin is warm and dry.  Neurological:     General: No focal deficit present.     Mental Status: Alert and oriented to person, place and time.     Cranial Nerves: Cranial nerves are intact.  EKG/LABS/ Recent Cardiac Studies    ECG personally reviewed by me today - ***   Risk Assessment/Calculations:   {Does this patient have ATRIAL FIBRILLATION?:[phone]}        Lab Results  Component Value Date   WBC 9.8 09/13/2021   HGB 17.3 (H) 09/13/2021   HCT 51.9 09/13/2021   MCV 91.7 09/13/2021   PLT 176 09/13/2021   Lab Results  Component Value Date   CREATININE 2.06 (H) 11/21/2022   BUN 21 11/21/2022   NA 149 (H) 11/21/2022   K 4.2 11/21/2022   CL 116 (HH) 11/21/2022   CO2 18 (L) 11/21/2022   Lab Results  Component Value Date   ALT 19 07/09/2022   AST 19 07/09/2022   ALKPHOS 91 07/09/2022   BILITOT 0.3 07/09/2022   Lab Results  Component Value Date   CHOL 222 (H) 07/09/2022   HDL 44 07/09/2022   LDLCALC 137 (H) 07/09/2022   LDLDIRECT 13 07/08/2016   TRIG 226 (H) 07/09/2022   CHOLHDL 5.0  07/09/2022    Lab Results  Component Value Date   HGBA1C 8.1 (H) 11/18/2020     Assessment & Plan    1.Essential hypertension: -Patient's blood pressure today is    2.Coronary artery disease: -s/p hybrid CABG with LIMA to LAD/Diag and PCI  of the RCA.  Patient underwent exercise Myoview in 2022   3.Hyperlipidemia: -Patient's last LDL was    4.CKD stage III -He is currently followed by nephrology and endocrinology  5.  Diabetes type 2:     Disposition: Follow-up with Armanda Magic, MD or APP in *** months {Are you ordering a CV Procedure (e.g. stress test, cath, DCCV, TEE, etc)?   Press F2        :[id number]}   Medication Adjustments/Labs and Tests Ordered: Current medicines are reviewed at length with the patient today.  Concerns regarding medicines are outlined above.   Signed, Napoleon Form, Leodis Rains, NP 03/25/2023, 8:27 AM [facility]

## 2023-03-26 ENCOUNTER — Ambulatory Visit: Payer: Medicare Other | Attending: Nurse Practitioner | Admitting: Nurse Practitioner

## 2023-03-26 ENCOUNTER — Encounter: Payer: Self-pay | Admitting: Nurse Practitioner

## 2023-03-26 VITALS — BP 172/84 | HR 54 | Ht 66.0 in | Wt 186.0 lb

## 2023-03-26 DIAGNOSIS — E7800 Pure hypercholesterolemia, unspecified: Secondary | ICD-10-CM | POA: Diagnosis not present

## 2023-03-26 DIAGNOSIS — I10 Essential (primary) hypertension: Secondary | ICD-10-CM | POA: Diagnosis not present

## 2023-03-26 DIAGNOSIS — N183 Chronic kidney disease, stage 3 unspecified: Secondary | ICD-10-CM | POA: Diagnosis not present

## 2023-03-26 DIAGNOSIS — Z794 Long term (current) use of insulin: Secondary | ICD-10-CM

## 2023-03-26 DIAGNOSIS — E1165 Type 2 diabetes mellitus with hyperglycemia: Secondary | ICD-10-CM | POA: Diagnosis not present

## 2023-03-26 DIAGNOSIS — I2510 Atherosclerotic heart disease of native coronary artery without angina pectoris: Secondary | ICD-10-CM | POA: Diagnosis not present

## 2023-03-26 MED ORDER — CARVEDILOL 6.25 MG PO TABS: 6.2500 mg | ORAL_TABLET | Freq: Two times a day (BID) | ORAL | 3 refills | Status: DC

## 2023-03-26 NOTE — Patient Instructions (Addendum)
Medication Instructions:  INCREASE Coreg to 6.25mg  Take 1 tablet twice day *If you need a refill on your cardiac medications before your next appointment, please call your pharmacy*   Lab Work: None ordered   Testing/Procedures: None ordered   Follow-Up: At [facility], you and your health needs are our priority.  As part of our continuing mission to provide you with exceptional heart care, we have created designated Provider Care Teams.  These Care Teams include your primary Cardiologist (physician) and Advanced Practice Providers (APPs -  Physician Assistants and Nurse Practitioners) who all work together to provide you with the care you need, when you need it.  We recommend signing up for the patient portal called "MyChart".  Sign up information is provided on this After Visit Summary.  MyChart is used to connect with patients for Virtual Visits (Telemedicine).  Patients are able to view lab/test results, encounter notes, upcoming appointments, etc.  Non-urgent messages can be sent to your provider as well.   To learn more about what you can do with MyChart, go to [email or web address].    Your next appointment:   3 month(s)  Provider:   Armanda Magic, MD  or Robin Searing, NP   Other Instructions STOP EATING PROCESSED FOOD  Check your blood pressure daily for 2 weeks, then contact the office with your readings.  Make sure to check 2 hours after your medications.   AVOID these things for 30 minutes before checking your blood pressure: No Drinking caffeine. No Drinking alcohol. No Eating. No Smoking. No Exercising.  Five minutes before checking your blood pressure: Pee. Sit in a dining chair. Avoid sitting in a soft couch or armchair. Be quiet. Do not talk.

## 2023-04-16 DIAGNOSIS — M109 Gout, unspecified: Secondary | ICD-10-CM | POA: Diagnosis not present

## 2023-04-16 DIAGNOSIS — I129 Hypertensive chronic kidney disease with stage 1 through stage 4 chronic kidney disease, or unspecified chronic kidney disease: Secondary | ICD-10-CM | POA: Diagnosis not present

## 2023-04-16 DIAGNOSIS — R809 Proteinuria, unspecified: Secondary | ICD-10-CM | POA: Diagnosis not present

## 2023-04-16 DIAGNOSIS — N1832 Chronic kidney disease, stage 3b: Secondary | ICD-10-CM | POA: Diagnosis not present

## 2023-04-16 DIAGNOSIS — E559 Vitamin D deficiency, unspecified: Secondary | ICD-10-CM | POA: Diagnosis not present

## 2023-04-16 DIAGNOSIS — N261 Atrophy of kidney (terminal): Secondary | ICD-10-CM | POA: Diagnosis not present

## 2023-04-16 DIAGNOSIS — N200 Calculus of kidney: Secondary | ICD-10-CM | POA: Diagnosis not present

## 2023-05-08 DIAGNOSIS — E1151 Type 2 diabetes mellitus with diabetic peripheral angiopathy without gangrene: Secondary | ICD-10-CM | POA: Diagnosis not present

## 2023-05-08 DIAGNOSIS — E785 Hyperlipidemia, unspecified: Secondary | ICD-10-CM | POA: Diagnosis not present

## 2023-05-08 DIAGNOSIS — I10 Essential (primary) hypertension: Secondary | ICD-10-CM | POA: Diagnosis not present

## 2023-05-08 DIAGNOSIS — I2510 Atherosclerotic heart disease of native coronary artery without angina pectoris: Secondary | ICD-10-CM | POA: Diagnosis not present

## 2023-05-08 DIAGNOSIS — Z8349 Family history of other endocrine, nutritional and metabolic diseases: Secondary | ICD-10-CM | POA: Diagnosis not present

## 2023-05-08 DIAGNOSIS — Z794 Long term (current) use of insulin: Secondary | ICD-10-CM | POA: Diagnosis not present

## 2023-05-08 DIAGNOSIS — N1832 Chronic kidney disease, stage 3b: Secondary | ICD-10-CM | POA: Diagnosis not present

## 2023-05-08 DIAGNOSIS — E039 Hypothyroidism, unspecified: Secondary | ICD-10-CM | POA: Diagnosis not present

## 2023-05-14 ENCOUNTER — Other Ambulatory Visit: Payer: Self-pay | Admitting: Nurse Practitioner

## 2023-07-02 ENCOUNTER — Ambulatory Visit: Payer: Medicare Other | Admitting: Nurse Practitioner

## 2023-07-25 ENCOUNTER — Other Ambulatory Visit: Payer: Self-pay | Admitting: Cardiology

## 2023-09-04 NOTE — Progress Notes (Addendum)
 Cardiology Office Note    Patient Name: Jeffrey Holmes Date of Encounter: 09/04/2023  Primary Care Provider:  Pcp, No Primary Cardiologist:  Wilbert Bihari, MD Primary Electrophysiologist: None   Past Medical History    Past Medical History:  Diagnosis Date   Allergy    Benign essential HTN 08/08/2015   CAD (coronary artery disease), native coronary artery 08/28/2015   Severe multivessel ASCAD s/p CABG hybrid with LIMA to LAD and diag and PCI of the RCA.    Chronic back pain    L5-S1   Chronic kidney disease, stage 3 (HCC)    multiple kidney stones   Coronary artery disease    Diabetes (HCC)    Diabetes mellitus without complication (HCC)    TYPE 2   GERD (gastroesophageal reflux disease)    OCC   Glaucoma    H/O hiatal hernia    Hepatic cyst 11/01/2016   History of acute renal failure 2015   secondary to stones, ELEVATED CREATININE, NO DIALYSIS DONE   History of kidney stones    Hx of adenomatous colonic polyps 06/11/2017   Hyperlipidemia 08/08/2015   Hypertension    Kidney stones    NAFLD (nonalcoholic fatty liver disease) 95/93/7981   PONV (postoperative nausea and vomiting)    hx of years ago    Splenic cyst 11/01/2016   Ureteral calculus 01/06/2014    History of Present Illness  Jeffrey Holmes is a 73 y.o. male with PMH of CAD s/p hybrid CABG with LIMA to LAD/Diag and PCI of the RCA , HTN, HLD, CKD stage III who presents today for follow-up of CAD and HTN.   Jeffrey Holmes was last seen on 03/26/2023 for follow-up of hypertension.  During visit his blood pressures were elevated at 152/70 and 172/84.  He reported some indiscretions in his diet with salt but reported being active with walking 1 to 2 miles a day.  Imdur was increased to 6.125 mg and patient was continued on lisinopril  40 mg daily.  He was advised to log his BP and report back to the office.  Jeffrey Holmes presents today for follow-up.  Since his previous follow-up he has noticed elevated blood pressure readings at home. He  also reports some swelling, but does not provide specific details about its location or duration. The patient admits to not always taking his medication as prescribed and not monitoring his blood pressure at home as regularly as he should. He also mentions an episode of dizziness one morning, which has not recurred since. The patient is currently on medication for erectile dysfunction and has made some dietary changes, such as reducing bacon intake, in an attempt to manage his blood pressure.   Patient denies chest pain, palpitations, dyspnea, PND, orthopnea, nausea, vomiting, dizziness, syncope, edema, weight gain, or early satiety.  Review of Systems  Please see the history of present illness.    All other systems reviewed and are otherwise negative except as noted above.  Physical Exam    Wt Readings from Last 3 Encounters:  03/26/23 186 lb (84.4 kg)  11/28/22 186 lb (84.4 kg)  11/12/22 186 lb (84.4 kg)   CD:Uyzmz were no vitals filed for this visit.,There is no height or weight on file to calculate BMI. GEN: Well nourished, well developed in no acute distress Neck: No JVD; No carotid bruits Pulmonary: Clear to auscultation without rales, wheezing or rhonchi  Cardiovascular: Normal rate. Regular rhythm. Normal S1. Normal S2.   Murmurs: There is no  murmur.  ABDOMEN: Soft, non-tender, non-distended EXTREMITIES:  No edema; No deformity   EKG/LABS/ Recent Cardiac Studies   ECG personally reviewed by me today -not completed today  Risk Assessment/Calculations:          Lab Results  Component Value Date   WBC 9.8 09/13/2021   HGB 17.3 (H) 09/13/2021   HCT 51.9 09/13/2021   MCV 91.7 09/13/2021   PLT 176 09/13/2021   Lab Results  Component Value Date   CREATININE 2.06 (H) 11/21/2022   BUN 21 11/21/2022   NA 149 (H) 11/21/2022   K 4.2 11/21/2022   CL 116 (HH) 11/21/2022   CO2 18 (L) 11/21/2022   Lab Results  Component Value Date   CHOL 222 (H) 07/09/2022   HDL 44  07/09/2022   LDLCALC 137 (H) 07/09/2022   LDLDIRECT 13 07/08/2016   TRIG 226 (H) 07/09/2022   CHOLHDL 5.0 07/09/2022    Lab Results  Component Value Date   HGBA1C 8.1 (H) 11/18/2020   Assessment & Plan    1.  Primary hypertension: -Patient's blood pressure today was elevated at 140/92 however unable to recheck prior to patient leaving clinic. -We will increase carvedilol  to 12.5 mg twice daily -Continue lisinopril  40 mg daily -Order renal ultrasound to screen for renal artery stenosis as secondary cause to HTN -Refer to hypertension clinic for closer monitoring and management. -Patient advised to bring blood pressure cuff to her visit and continue to monitor blood pressure daily  2.Coronary artery disease: -s/p hybrid CABG with LIMA to LAD/Diag and PCI of the RCA.  Patient underwent exercise Myoview  in 2022 -Today patient reports no chest pain or angina since previous follow-up. -Continue current GDMT with Plavix  75 mg, Repatha  140 mg q. 14 days Lipitor  80 mg, lisinopril  40 mg, along with 12.5 mg - Nitrostat  discontinued due to patient taking Cialis and sildenafil.  3.CKD stage III -He is currently followed by nephrology and endocrinology -Continue lisinopril  40 mg current treatment plan per nephrology.  4.Diabetes type 2: - Continue treatment plan per PCP -Patient currently followed by endocrinology  5.  Pure hypercholesteremia: -Patient's LDL cholesterol is 137 and triglycerides were 226 -We will obtain most current lipids from endocrinologist   Disposition: Follow-up with Wilbert Bihari, MD or APP in 3 months    Signed, Wyn Raddle, Jackee Shove, NP 09/04/2023, 1:21 PM [facility]

## 2023-09-05 ENCOUNTER — Ambulatory Visit: Payer: Medicare Other | Attending: Nurse Practitioner | Admitting: Nurse Practitioner

## 2023-09-05 ENCOUNTER — Encounter: Payer: Self-pay | Admitting: Nurse Practitioner

## 2023-09-05 VITALS — BP 140/92 | HR 72 | Ht 66.0 in | Wt 184.0 lb

## 2023-09-05 DIAGNOSIS — N183 Chronic kidney disease, stage 3 unspecified: Secondary | ICD-10-CM | POA: Diagnosis not present

## 2023-09-05 DIAGNOSIS — E1165 Type 2 diabetes mellitus with hyperglycemia: Secondary | ICD-10-CM | POA: Diagnosis not present

## 2023-09-05 DIAGNOSIS — I2510 Atherosclerotic heart disease of native coronary artery without angina pectoris: Secondary | ICD-10-CM | POA: Diagnosis not present

## 2023-09-05 DIAGNOSIS — E7800 Pure hypercholesterolemia, unspecified: Secondary | ICD-10-CM

## 2023-09-05 DIAGNOSIS — I10 Essential (primary) hypertension: Secondary | ICD-10-CM | POA: Diagnosis not present

## 2023-09-05 MED ORDER — CARVEDILOL 12.5 MG PO TABS: 12.5000 mg | ORAL_TABLET | Freq: Two times a day (BID) | ORAL | 3 refills | Status: DC

## 2023-09-05 NOTE — Patient Instructions (Addendum)
 Medication Instructions:  Your physician has recommended you make the following change in your medication:   1) STOP nitroglycerin  2) INCREASE carvedilol  (Coreg ) to 12.5 mg twice daily  *If you need a refill on your cardiac medications before your next appointment, please call your pharmacy*  Lab Work: None ordered today.  Testing/Procedures: Your physician has requested that you have a renal artery duplex. During this test, an ultrasound is used to evaluate blood flow to the kidneys. Allow one hour for this exam. Do not eat after midnight the day before and avoid carbonated beverages. Take your medications as you usually do.   Follow-Up: At [facility], you and your health needs are our priority.  As part of our continuing mission to provide you with exceptional heart care, we have created designated Provider Care Teams.  These Care Teams include your primary Cardiologist (physician) and Advanced Practice Providers (APPs -  Physician Assistants and Nurse Practitioners) who all work together to provide you with the care you need, when you need it.  Your next appointment:   4 weeks with Pharm D in HTN Clinic 3 month(s) with Dr. Shlomo  The format for your next appointment:   In Person  Provider:   Wilbert Shlomo, MD {  Other Instructions Please check your blood pressure daily at home and bring a list of your readings with you to your appointment with the pharmacist in the hypertension clinic.  HOW TO TAKE YOUR BLOOD PRESSURE Rest 5 minutes before taking your blood pressure. Don't  smoke or drink caffeinated beverages for at least 30 minutes before. Take your blood pressure before (not after) you eat. Sit comfortably with your back supported and both feet on the floor ( don't cross your legs). Elevate your arm to heart level on a table or a desk. Use the proper sized cuff.  It should fit smoothly and snugly around your bare upper arm. There should be enough room to slip a fingertip  under the cuff.  The bottom edge of the cuff should be 1 inch above the crease of the elbow.  Please monitor your blood pressure once daily 2 hours after your am medication. If you blood pressure consistently remains above 140 (systolic) top number or over 90 ( diastolic) bottom number X 3 days consecutively.  Please call our office at [phone] or send Mychart message.       1st Floor: - Lobby - Registration  - Pharmacy  - Lab - Cafe  2nd Floor: - PV Lab - Diagnostic Testing (echo, CT, nuclear med)  3rd Floor: - Vacant  4th Floor: - TCTS (cardiothoracic surgery) - AFib Clinic - Structural Heart Clinic - Vascular Surgery  - Vascular Ultrasound  5th Floor: - HeartCare Cardiology (general and EP) - Clinical Pharmacy for coumadin, hypertension, lipid, weight-loss medications, and med management appointments    Valet parking services will be available as well.

## 2023-09-16 ENCOUNTER — Other Ambulatory Visit: Payer: Self-pay | Admitting: Cardiology

## 2023-09-17 ENCOUNTER — Telehealth: Payer: Self-pay | Admitting: Pharmacist

## 2023-09-17 NOTE — Progress Notes (Addendum)
   09/17/2023  Patient ID: Veva Holes, male   DOB: Jun 15, 1951, 73 y.o.   MRN: [id number]  [employer] Scheduling:   Tried calling patient today to schedule free DM initial visit based on A1c reading with me as the pharmacist. Left HIPAA compliant voicemail requesting call back at earliest convenience to schedule. If patient returns call to office, you can transfer her to my line at [phone]. Thanks!   Update from 09/22/23:   Patient returned my call this morning regarding scheduling for DM visit. Visit scheduled for 10/07/23 at 10AM for phone call. Confirms understanding of visit and will call back to reschedule prior to then if needed.  Marlowe Aschoff, PharmD [facility] Phone Number: [phone]

## 2023-09-24 ENCOUNTER — Ambulatory Visit (HOSPITAL_COMMUNITY)
Admission: RE | Admit: 2023-09-24 | Discharge: 2023-09-24 | Disposition: A | Discharge status: Home / Self Care | Payer: Medicare Other | Source: Ambulatory Visit | Attending: Cardiovascular Disease | Admitting: Cardiovascular Disease

## 2023-09-24 DIAGNOSIS — I10 Essential (primary) hypertension: Secondary | ICD-10-CM | POA: Diagnosis not present

## 2023-09-30 ENCOUNTER — Other Ambulatory Visit: Payer: Self-pay

## 2023-09-30 DIAGNOSIS — E785 Hyperlipidemia, unspecified: Secondary | ICD-10-CM | POA: Diagnosis not present

## 2023-09-30 DIAGNOSIS — N281 Cyst of kidney, acquired: Secondary | ICD-10-CM

## 2023-09-30 DIAGNOSIS — I2510 Atherosclerotic heart disease of native coronary artery without angina pectoris: Secondary | ICD-10-CM | POA: Diagnosis not present

## 2023-09-30 DIAGNOSIS — K7689 Other specified diseases of liver: Secondary | ICD-10-CM

## 2023-09-30 DIAGNOSIS — E039 Hypothyroidism, unspecified: Secondary | ICD-10-CM | POA: Diagnosis not present

## 2023-09-30 DIAGNOSIS — E1151 Type 2 diabetes mellitus with diabetic peripheral angiopathy without gangrene: Secondary | ICD-10-CM | POA: Diagnosis not present

## 2023-09-30 DIAGNOSIS — Z794 Long term (current) use of insulin: Secondary | ICD-10-CM | POA: Diagnosis not present

## 2023-09-30 DIAGNOSIS — I10 Essential (primary) hypertension: Secondary | ICD-10-CM | POA: Diagnosis not present

## 2023-10-07 ENCOUNTER — Other Ambulatory Visit: Payer: Self-pay | Admitting: Pharmacist

## 2023-10-07 NOTE — Progress Notes (Addendum)
 10/07/2023 Name: Jeffrey Holmes MRN: [id number] DOB: 1950/11/27  Chief Complaint  Patient presents with   Diabetes    Jeffrey Holmes is a 73 y.o. year old male who presented for a telephone visit.   They were referred to the pharmacist by a quality report for assistance in managing diabetes as Jeffrey Holmes patient.  Subjective:  Care Team: Primary Care Provider: Pcp, No ; Next Scheduled Visit: 10/31/23- for Jeffrey Holmes (Endo) Clinical Pharmacist: Jeffrey Holmes, PharmD  Medication Access/Adherence  Current Pharmacy:  [facility], [place] [address] [place] - [address] [place] [address] Phone: [phone] Fax: [phone]   Patient reports affordability concerns with their medications: No  Patient reports access/transportation concerns to their pharmacy: No  Patient reports adherence concerns with their medications:  No     Diabetes:  Current medications: Basaglar 40 U daily, Lispro 30-42 U at evening meal (sliding scale) If your blood sugar is 70 to 199 mg/dL, then take 30 units. If your blood sugar is 200 to 299 mg/dL, then take 34 units. If your blood sugar is 300 to 399 mg/dL, then take 38 units. If your blood sugar is 400 mg/dL or higher, then take 42 units.  Medications tried in the past: Libre 2 (2023), Trulicity (nausea), Jardiance (worsening of creatinine)  Current glucose readings on 3/11 visit: 195 to 205 range, 135 today Using OneTouch Verio meter; testing 1-2 times daily *Had a 70 two days   Patient denies hypoglycemic s/sx including dizziness, shakiness, sweating. Patient denies hyperglycemic symptoms including polyuria, polydipsia, polyphagia, nocturia, neuropathy, blurred vision.  Current meal patterns:  - Occasional morning: Cereal or egg; usually skips though - One meal per day at 3-6PM: Processed foods or fast food - Snacks: Banana, peanut butter crackers prior to bed sometimes, PB&J sandwich - Drinks:  Water, sugar-free lemonade *Tries to avoid baked goods and ice cream  Current physical activity: 2 miles when able  Current medication access support: FEP BC[employer] Advantage Standard  09/30/23: A1c 12.3%, eGFR ~33  Objective:  Lab Results  Component Value Date   HGBA1C 8.1 (H) 11/18/2020    Lab Results  Component Value Date   CREATININE 2.06 (H) 11/21/2022   BUN 21 11/21/2022   NA 149 (H) 11/21/2022   K 4.2 11/21/2022   CL 116 (HH) 11/21/2022   CO2 18 (L) 11/21/2022    Lab Results  Component Value Date   CHOL 222 (H) 07/09/2022   HDL 44 07/09/2022   LDLCALC 137 (H) 07/09/2022   LDLDIRECT 13 07/08/2016   TRIG 226 (H) 07/09/2022   CHOLHDL 5.0 07/09/2022    Medications Reviewed Today     Reviewed by Jeffrey Holmes  Med List Status: <None>   Medication Order Taking? Sig Documenting Provider Last Dose Status Informant  allopurinol (ZYLOPRIM) 100 MG tablet [id number] No 2 (two) times daily. Provider, Historical, Holmes Unknown Active   atorvastatin (LIPITOR) 80 MG tablet [id number] Yes Take 1 tablet by mouth once daily Jeffrey Holmes Taking Active   carvedilol (COREG) 6.25 MG tablet [id number] Yes Take 6.25 mg by mouth 2 (two) times daily. Provider, Historical, Holmes Taking Active   clopidogrel (PLAVIX) 75 MG tablet [id number] Yes Take 1 tablet by mouth once daily Jeffrey Holmes Taking Active   Evolocumab [facility] SURECLICK) 140 MG/ML Ivory Broad [id number] No Inject 1 pen into the skin every 14 (fourteen) days.  Patient  not taking: Reported on 10/07/2023   Jeffrey Holmes Not Taking Active   Insulin Glargine [facility]) 100 UNIT/ML SOPN [id number] Yes Inject 0.2 mLs (20 Units total) into the skin at bedtime.  Patient taking differently: Inject 24 Units into the skin at bedtime.   Jeffrey Holmes Taking Active            Med Note Jeffrey Holmes   Tue Jeffrey 11, 2025 10:04 AM) 40 U in the AM  insulin lispro (HUMALOG) 100 UNIT/ML  injection [id number] Yes Inject 10-20 Units into the skin daily as needed for high blood sugar. With meals as needed  15-20 units with meals as needed  > 150= 20 units  ? 100= 15 units Provider, Historical, Holmes Taking Active Self           Med Note Jeffrey Holmes, Ski Polich Holmes   Tue Jeffrey 11, 2025 10:04 AM) If your blood sugar is 70 to 199 mg/dL, then take 30 units. If your blood sugar is 200 to 299 mg/dL, then take 34 units. If your blood sugar is 300 to 399 mg/dL, then take 38 units. If your blood sugar is 400 mg/dL or higher, then take 42 units.  latanoprost (XALATAN) 0.005 % ophthalmic solution [id number] No Place 1 drop into both eyes at bedtime. Provider, Historical, Holmes Unknown Active Self  lisinopril (ZESTRIL) 40 MG tablet [id number] Yes Take 1 tablet by mouth once daily Jeffrey Holmes Taking Active   sildenafil (REVATIO) 20 MG tablet [id number] Yes Take 20 mg by mouth as needed. Provider, Historical, Holmes Taking Active   tadalafil (CIALIS) 20 MG tablet [id number] Yes Take 20 mg by mouth daily as needed for erectile dysfunction. Provider, Historical, Holmes Taking Active   timolol (TIMOPTIC) 0.5 % ophthalmic solution [id number] No Place 1 drop into both eyes 2 (two) times daily. Provider, Historical, Holmes Unknown Active Self           Med Note Cherre Huger, CHASITIE R   Tue Aug 08, 2015  3:39 PM)    Travoprost, BAK Free, (TRAVATAN Z) 0.004 % SOLN ophthalmic solution [id number] No Place into both eyes daily. Provider, Historical, Holmes Unknown Active               Assessment/Plan:   Diabetes: - Currently uncontrolled - Reviewed long term cardiovascular and renal outcomes of uncontrolled blood sugar - Reviewed goal A1c, goal fasting, and goal 2 hour post prandial glucose - Patient denies personal or family history of multiple endocrine neoplasia type 2, medullary thyroid cancer; personal history of pancreatitis or gallbladder disease.   Follow Up Plan:  - Follow-up on 11/20/23 for second Holmes check-in -  Libre 3 Plus is more affordable/preferred over [employer] with insurance plan  - Requested sending in Rx for Libre 3 Plus + reader today - Discussed Mounjaro + Zofran for potential nausea- hold off for now (would be $35 monthly) - Send new Rx for Basaglar to correct dosing - Getting new phone for [place] phone- will send reader to pharmacy for now just in case - Plans to bring Josephine Igo to pharmacy at next visit with Jeffrey Holmes on 4/4 for demonstration on use  Called pharmacy on 10/13/23: Reports patient got the sensors + reader on 10/07/23 already    Ricka Burdock, PharmD [facility] Phone Number: [phone]

## 2023-10-14 ENCOUNTER — Ambulatory Visit (HOSPITAL_BASED_OUTPATIENT_CLINIC_OR_DEPARTMENT_OTHER)

## 2023-10-14 DIAGNOSIS — M109 Gout, unspecified: Secondary | ICD-10-CM | POA: Diagnosis not present

## 2023-10-14 DIAGNOSIS — N135 Crossing vessel and stricture of ureter without hydronephrosis: Secondary | ICD-10-CM | POA: Diagnosis not present

## 2023-10-14 DIAGNOSIS — E559 Vitamin D deficiency, unspecified: Secondary | ICD-10-CM | POA: Diagnosis not present

## 2023-10-14 DIAGNOSIS — N261 Atrophy of kidney (terminal): Secondary | ICD-10-CM | POA: Diagnosis not present

## 2023-10-14 DIAGNOSIS — N1832 Chronic kidney disease, stage 3b: Secondary | ICD-10-CM | POA: Diagnosis not present

## 2023-10-14 DIAGNOSIS — I129 Hypertensive chronic kidney disease with stage 1 through stage 4 chronic kidney disease, or unspecified chronic kidney disease: Secondary | ICD-10-CM | POA: Diagnosis not present

## 2023-10-14 DIAGNOSIS — N200 Calculus of kidney: Secondary | ICD-10-CM | POA: Diagnosis not present

## 2023-10-21 ENCOUNTER — Other Ambulatory Visit: Payer: Self-pay | Admitting: Nurse Practitioner

## 2023-10-22 ENCOUNTER — Ambulatory Visit (HOSPITAL_BASED_OUTPATIENT_CLINIC_OR_DEPARTMENT_OTHER)
Admission: RE | Admit: 2023-10-22 | Discharge: 2023-10-22 | Disposition: A | Discharge status: Home / Self Care | Source: Ambulatory Visit | Attending: Nurse Practitioner | Admitting: Nurse Practitioner

## 2023-10-22 DIAGNOSIS — K7689 Other specified diseases of liver: Secondary | ICD-10-CM | POA: Diagnosis not present

## 2023-10-22 DIAGNOSIS — N281 Cyst of kidney, acquired: Secondary | ICD-10-CM | POA: Diagnosis not present

## 2023-10-22 DIAGNOSIS — K760 Fatty (change of) liver, not elsewhere classified: Secondary | ICD-10-CM | POA: Diagnosis not present

## 2023-10-28 NOTE — Progress Notes (Unsigned)
 Patient ID: Jeffrey Holmes                 DOB: Oct 08, 1950                      MRN: [id number]      HPI: Jeffrey Holmes is a 73 y.o. male referred by Robin Searing, NP to HTN clinic. PMH is significant for T2DM, HTN, HLD,CAD, CKD stage 3b.   Patient saw NP on 03/26/23 BP was elevated Coreg was increased to 6.125 mg twice daily and advised to keep BOP log and report back to the office on 02/07 his home BP and office BP was high so carvedilol dose was titrated up to 12.5 mg twice daily. Renal  artery Duplex was ordered to rule out secondary cause,renal artery study showed no evidence of renal artery stenosis  Today patient presented for BP follow up. Forgot to bring home BP readings, reports he checks it occasionally. He has stopped taking Repatha as PA expired still takes Lipitor 80 mg daily and tolerates it well. Forget to take meds once a week in the morning so he takes non twice daily meds in the evening to make up. Reiterated importance of compliance. Ask to set phone reminder for the morning medications. Eats out processed food once a day. Home cooked meals are generally low in salt and rinse the canned goods well. Reviewed correct method to check BP in home setting. Like to walk so walks 1-2 miles 3-4 times per week. Does not like to do any resistance exercise. Will be seeing endocrinologist for [facility] and plan is to get off of insulin. Reports hx of intolerance to Trulicity.   Current HTN meds: Coreg 12.5 mg twice daily, lisinopril 40 mg daily  Previously tried:  BP goal: <130/80  Current lipid meds: Lipitor 80 mg daily and Repatha 140 mg Q14D  LDL goal: <55 mg/dl and TG <010 mg/dl   Family History:   Relation Problem Comments  Mother (Deceased at age 64) Diabetes   Heart attack angina  Kidney failure     Father (Deceased at age 29) Throat cancer     Sister Diabetes     Maternal Grandmother (Deceased)   Maternal Grandfather (Deceased)   Paternal Grandmother (Deceased)   Paternal  Grandfather (Deceased)     Social History:   Diet: low salt diet  Eats out once or twice a month  Fast food once or twice a day   Exercise:  None  Walking 1-2 miles 3-4 times per week    Home BP readings: checks occasionally  ~140/70 recalls from memory   Wt Readings from Last 3 Encounters:  10/29/23 185 lb (83.9 kg)  09/05/23 184 lb (83.5 kg)  03/26/23 186 lb (84.4 kg)   BP Readings from Last 3 Encounters:  10/29/23 (!) 155/78  09/05/23 (!) 140/92  03/26/23 (!) 172/84   Pulse Readings from Last 3 Encounters:  10/29/23 68  09/05/23 72  03/26/23 (!) 54    Renal function: CrCl cannot be calculated (Patient's most recent lab result is older than the maximum 21 days allowed.).  Past Medical History:  Diagnosis Date   Allergy    Benign essential HTN 08/08/2015   CAD (coronary artery disease), native coronary artery 08/28/2015   Severe multivessel ASCAD s/p CABG hybrid with LIMA to LAD and diag and PCI of the RCA.    Chronic back pain    L5-S1   Chronic kidney disease, stage  3 (HCC)    multiple kidney stones   Coronary artery disease    Diabetes (HCC)    Diabetes mellitus without complication (HCC)    TYPE 2   GERD (gastroesophageal reflux disease)    OCC   Glaucoma    H/O hiatal hernia    Hepatic cyst 11/01/2016   History of acute renal failure 2015   secondary to stones, ELEVATED CREATININE, NO DIALYSIS DONE   History of kidney stones    Hx of adenomatous colonic polyps 06/11/2017   Hyperlipidemia 08/08/2015   Hypertension    Kidney stones    NAFLD (nonalcoholic fatty liver disease) 65/78/4696   PONV (postoperative nausea and vomiting)    hx of years ago    Splenic cyst 11/01/2016   Ureteral calculus 01/06/2014    Current Outpatient Medications on File Prior to Visit  Medication Sig Dispense Refill   allopurinol (ZYLOPRIM) 100 MG tablet 2 (two) times daily.     atorvastatin (LIPITOR) 80 MG tablet Take 1 tablet by mouth once daily 90 tablet 2    clopidogrel (PLAVIX) 75 MG tablet Take 1 tablet by mouth once daily 90 tablet 3   Insulin Glargine (BASAGLAR KWIKPEN) 100 UNIT/ML SOPN Inject 0.2 mLs (20 Units total) into the skin at bedtime. (Patient taking differently: Inject 24 Units into the skin at bedtime.) 5 pen 11   insulin lispro (HUMALOG) 100 UNIT/ML injection Inject 10-20 Units into the skin daily as needed for high blood sugar. With meals as needed  15-20 units with meals as needed  > 150= 20 units  ? 100= 15 units     latanoprost (XALATAN) 0.005 % ophthalmic solution Place 1 drop into both eyes at bedtime.     lisinopril (ZESTRIL) 40 MG tablet Take 1 tablet by mouth once daily 90 tablet 2   sildenafil (REVATIO) 20 MG tablet Take 20 mg by mouth as needed.     tadalafil (CIALIS) 20 MG tablet Take 20 mg by mouth daily as needed for erectile dysfunction.     timolol (TIMOPTIC) 0.5 % ophthalmic solution Place 1 drop into both eyes 2 (two) times daily.     Travoprost, BAK Free, (TRAVATAN Z) 0.004 % SOLN ophthalmic solution Place into both eyes daily.     No current facility-administered medications on file prior to visit.    Allergies  Allergen Reactions   Vicodin [Hydrocodone-Acetaminophen] Nausea And Vomiting   Dulaglutide Nausea Only   Empagliflozin     Other Reaction(s): worsening of creatinine   Hydrocodone Nausea And Vomiting   Oxycontin [Oxycodone Hcl] Nausea And Vomiting   Percocet [Oxycodone-Acetaminophen] Nausea And Vomiting    Blood pressure (!) 155/78, pulse 68, height 5\' 5"  (1.651 m), weight 185 lb (83.9 kg), SpO2 98%.   Assessment/Plan:  1. Hypertension -  Benign essential HTN Assessment: BP is uncontrolled in office BP 152/82 mmHg heart rate 68 (goal <130/80) Takes carvedilol 12.5 mg twice daily with lisinopril 40 mg in the morning  Tolerates current BP meds well without any side effects Denies SOB, palpitation, chest pain, headaches,or swelling Eats processed food every day once a day, however home  cooked meals are low in sodium  Reiterated the importance of regular exercise and low salt diet  Discussed with pt - today's BP suggests addition of 2 BP medication he just wanted to increase carvedilol and work on his processed food consumption   Plan:  Increase Carvedilol dose from 12.5 mg twice daily to 25 mg twice daily  Continue taking  Lisinopril 40 mg daily  Patient to keep record of BP readings with heart rate and report to [place] at the next visit Patient to see PharmD in 6 weeks for follow up  Follow up lab(s) : none  In future can consider adding Amlodipine    Hyperlipidemia Assessment:  LDL goal: < 55 mg/dl  Only on Lipitor 80 mg daily and self discontinuation of Repatha as PA expired  Off of Repatha many months  PA renewed and Repatha restarted. Patient advised to continue taking Lipitor 80 mg daily; prescription for Repatha sent to the pharmacy confirm copay $35/month is affordable   Will repeat lipid lab in 2.5 to 3 months if LDL and/or TG come back above the goal will optimize therapy accordingly      Thank you  Carmela Hurt, Pharm.D [facility] [address] [place], [place] [address]  Phone: [phone]; Fax: [phone]

## 2023-10-29 ENCOUNTER — Other Ambulatory Visit (HOSPITAL_COMMUNITY): Payer: Self-pay

## 2023-10-29 ENCOUNTER — Ambulatory Visit: Payer: Medicare Other | Attending: Student | Admitting: Pharmacist

## 2023-10-29 ENCOUNTER — Telehealth: Payer: Self-pay | Admitting: Pharmacist

## 2023-10-29 ENCOUNTER — Encounter: Payer: Self-pay | Admitting: Pharmacist

## 2023-10-29 ENCOUNTER — Telehealth: Payer: Self-pay

## 2023-10-29 VITALS — BP 155/78 | HR 68 | Ht 65.0 in | Wt 185.0 lb

## 2023-10-29 DIAGNOSIS — E7849 Other hyperlipidemia: Secondary | ICD-10-CM | POA: Diagnosis not present

## 2023-10-29 DIAGNOSIS — I10 Essential (primary) hypertension: Secondary | ICD-10-CM | POA: Diagnosis not present

## 2023-10-29 MED ORDER — CARVEDILOL 12.5 MG PO TABS: 12.5000 mg | ORAL_TABLET | Freq: Two times a day (BID) | ORAL | 3 refills | Status: DC

## 2023-10-29 MED ORDER — REPATHA SURECLICK 140 MG/ML ~~LOC~~ SOAJ: 140.0000 mg | SUBCUTANEOUS | 3 refills | Status: AC

## 2023-10-29 MED ORDER — CARVEDILOL 25 MG PO TABS: 25.0000 mg | ORAL_TABLET | Freq: Two times a day (BID) | ORAL | 3 refills | Status: AC

## 2023-10-29 NOTE — Telephone Encounter (Signed)
 Pharmacy Patient Advocate Encounter   Received notification from CoverMyMeds that prior authorization for REPATHA is required/requested.   Insurance verification completed.   The patient is insured through [facility] .   Per test claim: PA required; PA submitted to above mentioned insurance via CoverMyMeds Key/confirmation #/[id number] [facility] Status is pending

## 2023-10-29 NOTE — Patient Instructions (Signed)
 Changes made by your pharmacist Carmela Hurt, PharmD at today's visit:    Instructions/Changes  (what do you need to do) Your Notes  (what you did and when you did it)  increase carvedilol dose from 12.5 mg twice daily to 25 mg twice daily continue taking lisinopril 40 mg daily and atorvastatin 80 mg    Will assess coverage criteria for Repatha will inform you once prior authorization get approved from your insurance    Avoid eating processed food goal to achieve zero processed food per week by next appointment on May 29,2025     Bring all of your meds, your BP cuff and your record of home blood pressures to your next appointment.    HOW TO TAKE YOUR BLOOD PRESSURE AT HOME  Rest 5 minutes before taking your blood pressure.  Don't smoke or drink caffeinated beverages for at least 30 minutes before. Take your blood pressure before (not after) you eat. Sit comfortably with your back supported and both feet on the floor (don't cross your legs). Elevate your arm to heart level on a table or a desk. Use the proper sized cuff. It should fit smoothly and snugly around your bare upper arm. There should be enough room to slip a fingertip under the cuff. The bottom edge of the cuff should be 1 inch above the crease of the elbow. Ideally, take 3 measurements at one sitting and record the average.  Important lifestyle changes to control high blood pressure  Intervention  Effect on the BP  Lose extra pounds and watch your waistline Weight loss is one of the most effective lifestyle changes for controlling blood pressure. If you're overweight or obese, losing even a small amount of weight can help reduce blood pressure. Blood pressure might go down by about 1 millimeter of mercury (mm Hg) with each kilogram (about 2.2 pounds) of weight lost.  Exercise regularly As a general goal, aim for at least 30 minutes of moderate physical activity every day. Regular physical activity can lower high blood  pressure by about 5 to 8 mm Hg.  Eat a healthy diet Eating a diet rich in whole grains, fruits, vegetables, and low-fat dairy products and low in saturated fat and cholesterol. A healthy diet can lower high blood pressure by up to 11 mm Hg.  Reduce salt (sodium) in your diet Even a small reduction of sodium in the diet can improve heart health and reduce high blood pressure by about 5 to 6 mm Hg.  Limit alcohol One drink equals 12 ounces of beer, 5 ounces of wine, or 1.5 ounces of 80-proof liquor.  Limiting alcohol to less than one drink a day for women or two drinks a day for men can help lower blood pressure by about 4 mm Hg.   If you have any questions or concerns please use My Chart to send questions or call the office at [phone]

## 2023-10-29 NOTE — Telephone Encounter (Signed)
 PA request has been Submitted. New Encounter has been or will be created for follow up. For additional info see Pharmacy Prior Auth telephone encounter from 10/29/23.

## 2023-10-29 NOTE — Assessment & Plan Note (Signed)
 Assessment:  LDL goal: < 55 mg/dl  Only on Lipitor 80 mg daily and self discontinuation of Repatha as PA expired  Off of Repatha many months  PA renewed and Repatha restarted. Patient advised to continue taking Lipitor 80 mg daily; prescription for Repatha sent to the pharmacy confirm copay $35/month is affordable   Will repeat lipid lab in 2.5 to 3 months if LDL and/or TG come back above the goal will optimize therapy accordingly

## 2023-10-29 NOTE — Telephone Encounter (Signed)
 Pharmacy Patient Advocate Encounter  Received notification from [facility] that Prior Authorization for REPATHA has been APPROVED from 07/31/23 to 10/28/24. Ran test claim, Copay is $35. This test claim was processed through [facility]- copay amounts may vary at other pharmacies due to pharmacy/plan contracts, or as the patient moves through the different stages of their insurance plan.

## 2023-10-29 NOTE — Assessment & Plan Note (Signed)
 Assessment: BP is uncontrolled in office BP 152/82 mmHg heart rate 68 (goal <130/80) Takes carvedilol 12.5 mg twice daily with lisinopril 40 mg in the morning  Tolerates current BP meds well without any side effects Denies SOB, palpitation, chest pain, headaches,or swelling Eats processed food every day once a day, however home cooked meals are low in sodium  Reiterated the importance of regular exercise and low salt diet  Discussed with pt - today's BP suggests addition of 2 BP medication he just wanted to increase carvedilol and work on his processed food consumption   Plan:  Increase Carvedilol dose from 12.5 mg twice daily to 25 mg twice daily  Continue taking Lisinopril 40 mg daily  Patient to keep record of BP readings with heart rate and report to [place] at the next visit Patient to see PharmD in 6 weeks for follow up  Follow up lab(s) : none  In future can consider adding Amlodipine

## 2023-10-30 DIAGNOSIS — Z794 Long term (current) use of insulin: Secondary | ICD-10-CM | POA: Diagnosis not present

## 2023-10-30 DIAGNOSIS — I10 Essential (primary) hypertension: Secondary | ICD-10-CM | POA: Diagnosis not present

## 2023-10-30 DIAGNOSIS — E785 Hyperlipidemia, unspecified: Secondary | ICD-10-CM | POA: Diagnosis not present

## 2023-10-30 DIAGNOSIS — E1151 Type 2 diabetes mellitus with diabetic peripheral angiopathy without gangrene: Secondary | ICD-10-CM | POA: Diagnosis not present

## 2023-10-30 DIAGNOSIS — E039 Hypothyroidism, unspecified: Secondary | ICD-10-CM | POA: Diagnosis not present

## 2023-10-30 DIAGNOSIS — I2510 Atherosclerotic heart disease of native coronary artery without angina pectoris: Secondary | ICD-10-CM | POA: Diagnosis not present

## 2023-10-30 NOTE — Telephone Encounter (Signed)
Pt made aware of PA approval. 

## 2023-11-20 ENCOUNTER — Other Ambulatory Visit: Payer: Self-pay | Admitting: Pharmacist

## 2023-11-20 NOTE — Progress Notes (Signed)
 11/20/2023 Name: Jeffrey Holmes MRN: [id number] DOB: 1951/05/15  Chief Complaint  Patient presents with   Diabetes    Jeffrey Holmes is a 73 y.o. year old male who presented for a telephone visit.   They were referred to the pharmacist by a quality report for assistance in managing diabetes as True [place] Metric DM patient.  Subjective:  Care Team: Primary Care Provider: Pcp, No ; Next Scheduled Visit: 01/09/24- for Dr. Kathyanne Parkers (Endo) Clinical Pharmacist: Kenneth Peace, PharmD  Medication Access/Adherence  Current Pharmacy:  [facility] [address] [place] - [address] [place] [address] Phone: [phone] Fax: [phone]   Patient reports affordability concerns with their medications: No  Patient reports access/transportation concerns to their pharmacy: No  Patient reports adherence concerns with their medications:  No     Diabetes:  Current medications: Basaglar  32 U daily, Lispro 30-42 U at evening meal (sliding scale); 15 U if snack If your blood sugar is 70 to 199 mg/dL, then take 30 units. If your blood sugar is 200 to 299 mg/dL, then take 34 units. If your blood sugar is 300 to 399 mg/dL, then take 38 units. If your blood sugar is 400 mg/dL or higher, then take 42 units.  Medications tried in the past: Libre 2 (2023), Trulicity (nausea), Jardiance (worsening of creatinine)  Current glucose readings on 11/20/23:  Average: 146 4 readings under: 143, 151, 148, 144 Above: 14% Target: 86% Below: 0% Low glucose events: 1 (6PM-12AM) *Lows usually occur before 6PM he thought   Glucose readings on 4/10: Average 154 Current glucose readings on 3/11 visit: 195 to 205 range, 135 today Using OneTouch Verio meter; testing 1-2 times daily   Patient denies hypoglycemic s/sx including dizziness, shakiness, sweating. Patient denies hyperglycemic symptoms including polyuria, polydipsia, polyphagia, nocturia, neuropathy, blurred  vision.  Current meal patterns:  - Occasional morning: Cereal or egg; usually skips though - One meal per day at 3-6PM: Processed foods or fast food - Snacks: Banana, peanut butter crackers prior to bed sometimes, PB&J sandwich - Drinks: Water , sugar-free lemonade *Tries to avoid baked goods and ice cream  Current physical activity: 2 miles when able  Current medication access support: FEP BCBS Advantage Standard  09/30/23: A1c 12.3%, eGFR ~33  Objective:  Lab Results  Component Value Date   HGBA1C 8.1 (H) 11/18/2020    Lab Results  Component Value Date   CREATININE 2.06 (H) 11/21/2022   BUN 21 11/21/2022   NA 149 (H) 11/21/2022   K 4.2 11/21/2022   CL 116 (HH) 11/21/2022   CO2 18 (L) 11/21/2022    Lab Results  Component Value Date   CHOL 222 (H) 07/09/2022   HDL 44 07/09/2022   LDLCALC 137 (H) 07/09/2022   LDLDIRECT 13 07/08/2016   TRIG 226 (H) 07/09/2022   CHOLHDL 5.0 07/09/2022    Medications Reviewed Today     Reviewed by Jacquie Maudlin, RPH (Pharmacist) on 11/20/23 at 1000  Med List Status: <None>   Medication Order Taking? Sig Documenting Provider Last Dose Status Informant  atorvastatin  (LIPITOR ) 80 MG tablet [id number] Yes Take 1 tablet by mouth once daily Turner, Rufus Council, MD Taking Active   carvedilol  (COREG ) 25 MG tablet [id number] Yes Take 1 tablet (25 mg total) by mouth 2 (two) times daily with a meal. Turner, Rufus Council, MD Taking Active   clopidogrel  (PLAVIX ) 75 MG tablet [id number] Yes Take 1 tablet by mouth once daily  Gerald Kitty., NP Taking Active   Continuous Glucose Receiver (FREESTYLE LIBRE 3 READER) [place] [id number] Yes as directed. [provider] Taking Active   Continuous Glucose Sensor (FREESTYLE LIBRE 3 PLUS SENSOR) MISC [id number] Yes Apply topically. [provider] Taking Active   Evolocumab  (REPATHA  SURECLICK) 140 MG/ML SOAJ [id number] Yes Inject 140 mg into the skin every 14 (fourteen) days. Jacqueline Matsu, MD Taking  Active   Insulin  Glargine (BASAGLAR  KWIKPEN) 100 UNIT/ML SOPN [id number] Yes Inject 0.2 mLs (20 Units total) into the skin at bedtime.  Patient taking differently: Inject 32 Units into the skin at bedtime.   Gwyndolyn Lerner, MD Taking Active            Med Note Manfred Seed, Madysyn Hanken M   Thu Nov 20, 2023  9:57 AM) 32U in the AM  insulin  lispro (HUMALOG ) 100 UNIT/ML injection [id number] Yes Inject 10-20 Units into the skin daily as needed for high blood sugar. With meals as needed  15-20 units with meals as needed  > 150= 20 units  ? 100= 15 units [provider] Taking Active Self           Med Note Manfred Seed, Shivaan Tierno M   Thu Nov 20, 2023  9:58 AM) If your blood sugar is 70 to 199 mg/dL, then take 30 units. If your blood sugar is 200 to 299 mg/dL, then take 34 units. If your blood sugar is 300 to 399 mg/dL, then take 38 units. If your blood sugar is 400 mg/dL or higher, then take 42 units. If snack only, then take 15 units.  Patient not taking:  Discontinued 11/20/23 1000 (Completed Course) lisinopril  (ZESTRIL ) 40 MG tablet [id number] Yes Take 1 tablet by mouth once daily Dick, Ernest H Jr., NP Taking Active   sildenafil (REVATIO) 20 MG tablet [id number] Yes Take 20 mg by mouth as needed. [provider] Taking Active   tadalafil (CIALIS) 20 MG tablet [id number] Yes Take 20 mg by mouth daily as needed for erectile dysfunction. [provider] Taking Active   Patient not taking:  Discontinued 11/20/23 1000 (Completed Course)          Med Note [facility], CHASITIE R   Tue Aug 08, 2015  3:39 PM)    Patient not taking:  Discontinued 11/20/23 1000 (Completed Course)   Vitamin D, Ergocalciferol, (DRISDOL) 1.25 MG (50000 UNIT) CAPS capsule [id number] Yes Take 50,000 Units by mouth once a week. [provider] Taking Active               Assessment/Plan:   Diabetes: - Currently uncontrolled - Reviewed long term cardiovascular and renal outcomes of uncontrolled blood sugar -  Reviewed goal A1c, goal fasting, and goal 2 hour post prandial glucose - Patient denies personal or family history of multiple endocrine neoplasia type 2, medullary thyroid  cancer; personal history of pancreatitis or gallbladder disease.   Follow Up Plan:  - Follow-up on 12/11/23 for DM check-in - Using [place] reader to monitor BG - Having Repatha  cost concerns- approved for [employer] today; will give to pharmacy on 12/01/23 when due again - BG readings look good since Basaglar  dose changed along with sliding scale - Discussion over how to properly correct BG readings today (take all current methods and cut in-half; I.e. 1 tablespoon of sugar in 4 oz of water , 4-5 pieces of candy, 4 oz of juice, etc); wait 15 minutes and repeat if needed if BG is not greater than 70 yet; was overcorrecting  to 150 with lows   - Patient had discussion with Cardio and Dr. Kathyanne Parkers regarding [place] Hunt + Zofran  for potential nausea- hold off for now (would be $35 monthly); Holmes concern of nausea again   Delvin File, PharmD [facility]  Phone Number: [phone]

## 2023-12-01 ENCOUNTER — Ambulatory Visit: Payer: Medicare Other | Attending: Cardiology | Admitting: Cardiology

## 2023-12-01 ENCOUNTER — Telehealth: Payer: Self-pay | Admitting: Pharmacist

## 2023-12-01 ENCOUNTER — Encounter: Payer: Self-pay | Admitting: Cardiology

## 2023-12-01 VITALS — BP 150/76 | HR 55 | Ht 65.0 in | Wt 194.2 lb

## 2023-12-01 DIAGNOSIS — I10 Essential (primary) hypertension: Secondary | ICD-10-CM | POA: Insufficient documentation

## 2023-12-01 DIAGNOSIS — I2510 Atherosclerotic heart disease of native coronary artery without angina pectoris: Secondary | ICD-10-CM | POA: Diagnosis not present

## 2023-12-01 DIAGNOSIS — E7800 Pure hypercholesterolemia, unspecified: Secondary | ICD-10-CM | POA: Diagnosis not present

## 2023-12-01 LAB — LIPID PANEL

## 2023-12-01 MED ORDER — AMLODIPINE BESYLATE 5 MG PO TABS: 5.0000 mg | ORAL_TABLET | Freq: Every day | ORAL | 3 refills | Status: DC

## 2023-12-01 NOTE — Addendum Note (Signed)
 Addended by: Priyal Musquiz N on: 12/01/2023 10:31 AM   Modules accepted: Orders

## 2023-12-01 NOTE — Progress Notes (Signed)
 Date:  12/01/2023   ID:  Jeffrey Holmes, DOB 1950/10/24, MRN [id number]  PCP:  Pcp, No  Cardiologist:  Gaylyn Keas, MD Electrophysiologist:  None   Chief Complaint:  CAD, HTN, Hyperlipidemia  History of Present Illness:     Jeffrey Holmes is a 73 y.o. 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  He also has a history of HTN, dyslipidemia and CKD stage 3.  He had a nuclear stress test a year ago that was normal.   He is here today for followup and is doing well.  He has chronic  DOE when doing exertional activities that are prolonged. This is no different from 2022 when he had a Lexiscan  myoview  done for SOB.   It depends on the activity that he is doing. He denies any chest pain or pressure, PND, orthopnea, LE edema, dizziness, palpitations or syncope. He is compliant with his meds and is tolerating meds with no SE.    Prior CV studies:   The following studies were reviewed today:  None   Past Medical History:  Diagnosis Date   Allergy    Benign essential HTN 08/08/2015   CAD (coronary artery disease), native coronary artery 08/28/2015   Severe multivessel ASCAD s/p CABG hybrid with LIMA to LAD and diag and PCI of the RCA.    Chronic back pain    L5-S1   Chronic kidney disease, stage 3 (HCC)    multiple kidney stones   Coronary artery disease    Diabetes (HCC)    Diabetes mellitus without complication (HCC)    TYPE 2   GERD (gastroesophageal reflux disease)    OCC   Glaucoma    H/O hiatal hernia    Hepatic cyst 11/01/2016   History of acute renal failure 2015   secondary to stones, ELEVATED CREATININE, NO DIALYSIS DONE   History of kidney stones    Hx of adenomatous colonic polyps 06/11/2017   Hyperlipidemia 08/08/2015   Hypertension    Kidney stones    NAFLD (nonalcoholic fatty liver disease) 40/98/1191   PONV (postoperative nausea and vomiting)    hx of years ago    Splenic cyst 11/01/2016   Ureteral  calculus 01/06/2014   Past Surgical History:  Procedure Laterality Date   ANGIOPLASTY N/A 08/21/2015   Procedure: ANGIOPLASTY WITH PERCUTANEOUS CORONARY INTERVENTION WITH DES TO RIGHT CORONARY ARTERY;  Surgeon: Lucendia Rusk, MD;  Location: [facility] OR;  Service: Cardiovascular;  Laterality: N/A;   BACK SURGERY  2006   microdisectomy L5-S1   CARDIAC CATHETERIZATION N/A 08/18/2015   Procedure: Left Heart Cath and Coronary Angiography;  Surgeon: Lucendia Rusk, MD;  Location: [facility] INVASIVE CV LAB;  Service: Cardiovascular;  Laterality: N/A;   CARDIAC CATHETERIZATION N/A 08/21/2015   Procedure: Coronary Stent Intervention;  Surgeon: Lucendia Rusk, MD;  Location: [facility] INVASIVE CV LAB;  Service: Cardiovascular;  Laterality: N/A;   CARDIAC CATHETERIZATION  08/21/2015   Procedure: Bypass Graft Angiography;  Surgeon: Lucendia Rusk, MD;  Location: [facility] INVASIVE CV LAB;  Service: Cardiovascular;;   CARDIOVASCULAR STRESS TEST  08/15/2015   cataract surgery      COLONOSCOPY     CORONARY ARTERY BYPASS GRAFT N/A 08/21/2015   Procedure: OFF PUMP CORONARY ARTERY BYPASS GRAFTING (CABG), TIMES TWO, USING LEFT INTERNAL MAMMARY ARTERY, RIGHT GREATER SAPHENOUS VEIN HARVESTED ENDOSCOPICALLY;  Surgeon: Norita Beauvais, MD;  Location: [facility] OR;  Service: Open Heart Surgery;  Laterality: N/A;   CYSTOSCOPY W/ URETERAL STENT PLACEMENT Right 03/12/2017   Procedure: CYSTOSCOPY WITH RETROGRADE PYELOGRAM/URETERAL STENT PLACEMENT;  Surgeon: Erman Hayward, MD;  Location: [facility] ORS;  Service: Urology;  Laterality: Right;   CYSTOSCOPY W/ URETERAL STENT PLACEMENT Right 12/13/2020   Procedure: CYSTOSCOPY WITH RETROGRADE PYELOGRAM/URETERAL STENT PLACEMENT;  Surgeon: Trent Frizzle, MD;  Location: [facility] ORS;  Service: Urology;  Laterality: Right;   CYSTOSCOPY WITH RETROGRADE PYELOGRAM, URETEROSCOPY AND STENT PLACEMENT Right 01/06/2014   Procedure: CYSTOSCOPY WITH RETROGRADE PYELOGRAM, URETEROSCOPY AND STENT PLACEMENT;   Surgeon: Livingston Rigg, MD;  Location: [facility] ORS;  Service: Urology;  Laterality: Right;   CYSTOSCOPY/URETEROSCOPY/HOLMIUM LASER/STENT PLACEMENT Right 03/25/2017   Procedure: RIGHT URETEROSCOPY WITH HOLMIUM LASER STENT EXCHANGE;  Surgeon: Homero Luster, MD;  Location: [facility] ORS;  Service: Urology;  Laterality: Right;   CYSTOSCOPY/URETEROSCOPY/HOLMIUM LASER/STENT PLACEMENT Bilateral 11/17/2020   Procedure: CYSTOSCOPY/URETEROSCOPY/HOLMIUM LASER/STENT PLACEMENT;  Surgeon: Samson Croak, MD;  Location: [facility];  Service: Urology;  Laterality: Bilateral;   CYSTOSCOPY/URETEROSCOPY/HOLMIUM LASER/STENT PLACEMENT Bilateral 12/05/2020   Procedure: CYSTOSCOPY BILATERAL /URETEROSCOPY/HOLMIUM LASER/STENT PLACEMENT;  Surgeon: Homero Luster, MD;  Location: [facility];  Service: Urology;  Laterality: Bilateral;   ESOPHAGOGASTRODUODENOSCOPY ENDOSCOPY     normal   LITHOTRIPSY     2-3 times in past   TEE WITHOUT CARDIOVERSION N/A 08/21/2015   Procedure: TRANSESOPHAGEAL ECHOCARDIOGRAM (TEE);  Surgeon: Norita Beauvais, MD;  Location: [facility] OR;  Service: Open Heart Surgery;  Laterality: N/A;   TONSILLECTOMY     URETEROSCOPY WITH HOLMIUM LASER LITHOTRIPSY Right 12/13/2020   Procedure: URETEROSCOPY WITH HOLMIUM LASER LITHOTRIPSY;  Surgeon: Trent Frizzle, MD;  Location: [facility];  Service: Urology;  Laterality: Right;   WISDOM TOOTH EXTRACTION       Current Meds  Medication Sig   atorvastatin  (LIPITOR ) 80 MG tablet Take 1 tablet by mouth once daily   carvedilol  (COREG ) 25 MG tablet Take 1 tablet (25 mg total) by mouth 2 (two) times daily with a meal.   clopidogrel  (PLAVIX ) 75 MG tablet Take 1 tablet by mouth once daily   Continuous Glucose Receiver (FREESTYLE LIBRE 3 READER) [place] as directed.   Continuous Glucose Sensor (FREESTYLE LIBRE 3 PLUS SENSOR) MISC Apply topically.   Evolocumab  (REPATHA  SURECLICK) 140 MG/ML SOAJ Inject 140 mg into the skin every 14 (fourteen) days.   Insulin  Glargine (BASAGLAR  KWIKPEN) 100 UNIT/ML  SOPN Inject 0.2 mLs (20 Units total) into the skin at bedtime. (Patient taking differently: Inject 32 Units into the skin at bedtime.)   insulin  lispro (HUMALOG ) 100 UNIT/ML injection Inject 10-20 Units into the skin daily as needed for high blood sugar. With meals as needed  15-20 units with meals as needed  > 150= 20 units  ? 100= 15 units   lisinopril  (ZESTRIL ) 40 MG tablet Take 1 tablet by mouth once daily   sildenafil (REVATIO) 20 MG tablet Take 20 mg by mouth as needed.   tadalafil (CIALIS) 20 MG tablet Take 20 mg by mouth daily as needed for erectile dysfunction.   Vitamin D, Ergocalciferol, (DRISDOL) 1.25 MG (50000 UNIT) CAPS capsule Take 50,000 Units by mouth once a week.     Allergies:   Vicodin [hydrocodone-acetaminophen ], Dulaglutide, Empagliflozin, Hydrocodone, Oxycontin  [oxycodone  hcl], and Percocet [oxycodone -acetaminophen ]   Social History   Tobacco Use   Smoking status: Never   Smokeless tobacco: Never  Vaping Use   Vaping status: Never Used  Substance Use Topics   Alcohol use: Never   Drug use:  No     Family Hx: The patient's family history includes Diabetes in his mother and sister; Heart attack in his mother; Kidney failure in his mother; Throat cancer in his father. There is no history of Colon cancer, Colon polyps, Rectal cancer, or Stomach cancer.  ROS:   Please see the history of present illness.     All other systems reviewed and are negative.   Labs/Other Tests and Data Reviewed:    Recent Labs: No results found for requested labs within last 365 days.   Recent Lipid Panel Lab Results  Component Value Date/Time   CHOL 222 (H) 07/09/2022 09:14 AM   TRIG 226 (H) 07/09/2022 09:14 AM   HDL 44 07/09/2022 09:14 AM   CHOLHDL 5.0 07/09/2022 09:14 AM   CHOLHDL 1.6 07/08/2016 08:55 AM   LDLCALC 137 (H) 07/09/2022 09:14 AM   LDLDIRECT 13 07/08/2016 08:55 AM    Wt Readings from Last 3 Encounters:  12/01/23 194 lb 3.2 oz (88.1 kg)  10/29/23 185 lb  (83.9 kg)  09/05/23 184 lb (83.5 kg)     Objective:    Vital Signs:  BP (!) 150/76   Pulse (!) 55   Ht 5\' 5"  (1.651 m)   Wt 194 lb 3.2 oz (88.1 kg)   SpO2 98%   BMI 32.32 kg/m    GEN: Well nourished, well developed in no acute distress HEENT: Normal NECK: No JVD; No carotid bruits LYMPHATICS: No lymphadenopathy CARDIAC:RRR, no murmurs, rubs, gallops RESPIRATORY:  Clear to auscultation without rales, wheezing or rhonchi  ABDOMEN: Soft, non-tender, non-distended MUSCULOSKELETAL:  No edema; No deformity  SKIN: Warm and dry NEUROLOGIC:  Alert and oriented x 3 PSYCHIATRIC:  Normal affect  ASSESSMENT & PLAN:    1.  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   - Lexiscan  Myoview  05/17/2021 showed no ischemia (done for SOB and CP) - He has not had any CP since he was seen last.  - SOB is unchanged from when he had stress test 2022>>likely related to poorly controlled HTN -Continue carvedilol  25 mg twice daily, Plavix  75 mg daily, Repatha  and atorvastatin  80 mg daily with as needed refills  2.  Hypertension  - BP not well controlled on exam today - he admits to eating fast food a lot>>encouraged to cut back on fast food - Continue carvedilol  25 mg twice daily and Lisinopril  40mg  daily - start Amlodipine  5mg  daily - check BP twice daily for a week and call with results  3.  Hyperlipidemia  -his LDL goal is < 70.  -check FLP and ALT -Continue atorvastatin  80 mg daily and Repatha  with as needed refills  4.  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  Concerns regarding medicines are outlined above.  Tests Ordered: No orders of the defined types were placed in  this encounter.   Medication Changes: No orders of the defined types were placed in this encounter.    Disposition:  Follow up in 1 year(s)  Signed, Gaylyn Keas, MD  12/01/2023 10:14 AM    [facility]

## 2023-12-01 NOTE — Patient Instructions (Signed)
 Medication Instructions:  Start amlodipine  5 mg once daily *If you need a refill on your cardiac medications before your next appointment, please call your pharmacy*  Lab Work: FLP, ALT today If you have labs (blood work) drawn today and your tests are completely normal, you will receive your results only by: MyChart Message (if you have MyChart) OR A paper copy in the mail If you have any lab test that is abnormal or we need to change your treatment, we will call you to review the results.  Testing/Procedures: NONE  Follow-Up: At [facility], you and your health needs are our priority.  As part of our continuing mission to provide you with exceptional heart care, our providers are all part of one team.  This team includes your primary Cardiologist (physician) and Advanced Practice Providers or APPs (Physician Assistants and Nurse Practitioners) who all work together to provide you with the care you need, when you need it.  Your next appointment:   1 year  Provider:   [name], MD  We recommend signing up for the patient portal called "MyChart".  Sign up information is provided on this After Visit Summary.  MyChart is used to connect with patients for Virtual Visits (Telemedicine).  Patients are able to view lab/test results, encounter notes, upcoming appointments, etc.  Non-urgent messages can be sent to your provider as well.   To learn more about what you can do with MyChart, go to [email or web address].   Other Instructions Check blood pressure twice a day and send us  the readings after a week.

## 2023-12-01 NOTE — Progress Notes (Signed)
   12/01/2023  Patient ID: Jeffrey Holmes, male   DOB: 23-Sep-1950, 73 y.o.   MRN: [id number]  Called the pharmacy and re-processed the Repatha  with [employer] grant card at no charge. Will work on getting ready now.    Delvin File, PharmD [facility]  Phone Number: [phone]

## 2023-12-02 LAB — LIPID PANEL
Cholesterol, Total: 103 mg/dL (ref 100–199)
HDL: 43 mg/dL (ref >39)
LDL CALC COMMENT:: 2.4 ratio (ref 0.0–5.0)
LDL Chol Calc (NIH): 32 mg/dL (ref 0–99)
Triglycerides: 169 mg/dL — ABNORMAL HIGH (ref 0–149)
VLDL Cholesterol Cal: 28 mg/dL (ref 5–40)

## 2023-12-02 LAB — ALT: ALT: 11 IU/L (ref 0–44)

## 2023-12-11 ENCOUNTER — Other Ambulatory Visit: Payer: Self-pay | Admitting: Pharmacist

## 2023-12-11 NOTE — Progress Notes (Signed)
 12/11/2023 Name: Jeffrey Holmes MRN: [id number] DOB: 1950-08-18  Chief Complaint  Patient presents with   Diabetes    ARVON LEWELLYN is a 73 y.o. year old male who presented for a telephone visit.   They were referred to the pharmacist by a quality report for assistance in managing diabetes as True [place] Metric DM patient.  Subjective:  Care Team: Primary Care Provider: Pcp, No ; Next Scheduled Visit: 01/09/24- for Dr. Kathyanne Parkers (Endo) Clinical Pharmacist: Delvin File, PharmD  Medication Access/Adherence  Current Pharmacy:  [facility] [address], [place] - [address] [place] [address] Phone: [phone] Fax: [phone]   Patient reports affordability concerns with their medications: No  Patient reports access/transportation concerns to their pharmacy: No  Patient reports adherence concerns with their medications:  No     Diabetes:  Current medications: Basaglar  32 U daily, Lispro 30-42 U at evening meal (sliding scale); 15 U if snack If your blood sugar is 70 to 199 mg/dL, then take 30 units. If your blood sugar is 200 to 299 mg/dL, then take 34 units. If your blood sugar is 300 to 399 mg/dL, then take 38 units. If your blood sugar is 400 mg/dL or higher, then take 42 units. *Actually taking Lispro 10-25U with meals and snacks depending on level of carbs in meal (NOT scale above)   Medications tried in the past: Libre 2 (2023), Trulicity (nausea), Jardiance (worsening of creatinine)  Current glucose readings on 12/11/23:  Average: 155 4 readings under: 154, 157, 142, 167 Above: 24% Target: 75% Below: 1% Low glucose events: Usually during daytime occasionally  Current glucose readings on 11/20/23:  Average: 146 4 readings under: 143, 151, 148, 144 Above: 14% Target: 86% Below: 0% Low glucose events: 1 (6PM-12AM) *Lows usually occur before 6PM he thought   Glucose readings on 4/10: Average 154 Current glucose  readings on 3/11 visit: 195 to 205 range, 135 today Using OneTouch Verio meter; testing 1-2 times daily   Patient denies hypoglycemic s/sx including dizziness, shakiness, sweating. Patient denies hyperglycemic symptoms including polyuria, polydipsia, polyphagia, nocturia, neuropathy, blurred vision.  Current meal patterns:  - Occasional morning: Cereal or egg; usually skips though - One meal per day at 3-6PM: Processed foods or fast food - Snacks: Banana, peanut butter crackers prior to bed sometimes, PB&J sandwich - Drinks: Water , sugar-free lemonade *Tries to avoid baked goods and ice cream  Current physical activity: 2 miles when able  Current medication access support: FEP BCBS Advantage Standard  09/30/23: A1c 12.3%, eGFR ~33  Objective:  Lab Results  Component Value Date   HGBA1C 8.1 (H) 11/18/2020    Lab Results  Component Value Date   CREATININE 2.06 (H) 11/21/2022   BUN 21 11/21/2022   NA 149 (H) 11/21/2022   K 4.2 11/21/2022   CL 116 (HH) 11/21/2022   CO2 18 (L) 11/21/2022    Lab Results  Component Value Date   CHOL 103 12/01/2023   HDL 43 12/01/2023   LDLCALC 32 12/01/2023   LDLDIRECT 13 07/08/2016   TRIG 169 (H) 12/01/2023   CHOLHDL 2.4 12/01/2023    Medications Reviewed Today   Medications were not reviewed in this encounter       Assessment/Plan:   Diabetes: - Currently uncontrolled - Reviewed long term cardiovascular and renal outcomes of uncontrolled blood sugar - Reviewed goal A1c, goal fasting, and goal 2 hour post prandial glucose - Patient denies personal or family  history of multiple endocrine neoplasia type 2, medullary thyroid  cancer; personal history of pancreatitis or gallbladder disease. - Took advise to correct low BG's with less orange juice and sugar; said glucose tablets don't work quickly but will pack some in his car to help in case of emergencies (instead of having to rush home each time)   Follow Up Plan:  - Follow-up on  02/03/24 for DM check-in and new A1c - Using [place] reader to monitor BG - Advised Repatha  should be ready at the pharmacy for pick-up at no charge - BG readings higher in the afternoon since last talking- has been drinking watered down sweet tea or Koolaid instead of a full snack at night (not taking insulin  to avoid lows while sleeping) - Having more lows than last call, but average is higher than last call as well  - Advised to write down when having lows, meal eaten, and amount of insulin  taken   - Discussion about ensuring to take insulin  if even just eating a snack  - Aware to monitor how much sweet tea and Koolaid increase BG- may need smaller dose of insulin  - Doing well with Amlodipine  but "not seeing much of a difference" in his readings- advised to continue writing down and bring to upcoming Cardiology visit   - Patient had discussion with Cardio and Dr. Kathyanne Parkers regarding [place] + Zofran  for potential nausea- hold off for now (would be $35 monthly); main concern of nausea again   Delvin File, PharmD [facility]  Phone Number: [phone]

## 2023-12-25 ENCOUNTER — Ambulatory Visit: Attending: Internal Medicine | Admitting: Pharmacist

## 2023-12-25 ENCOUNTER — Encounter: Payer: Self-pay | Admitting: Pharmacist

## 2023-12-25 VITALS — BP 150/90 | HR 54

## 2023-12-25 DIAGNOSIS — I10 Essential (primary) hypertension: Secondary | ICD-10-CM | POA: Diagnosis not present

## 2023-12-25 MED ORDER — AMLODIPINE BESYLATE 10 MG PO TABS: 10.0000 mg | ORAL_TABLET | Freq: Every day | ORAL | 3 refills | Status: AC

## 2023-12-25 NOTE — Patient Instructions (Addendum)
 Changes made by your pharmacist Nickola Baron, PharmD at today's visit:    Instructions/Changes  (what do you need to do) Your Notes  (what you did and when you did it)  Increase amlodipine  dose from 5 mg daily to 10 mg daily    Continue taking Coreg  25 mg twice daily, lisinopril  40 mg daily,    Lower consumption of processed food that has hidden sodium     Bring all of your meds, your BP cuff and your record of home blood pressures to your next appointment.    HOW TO TAKE YOUR BLOOD PRESSURE AT HOME  Rest 5 minutes before taking your blood pressure.  Don't smoke or drink caffeinated beverages for at least 30 minutes before. Take your blood pressure before (not after) you eat. Sit comfortably with your back supported and both feet on the floor (don't cross your legs). Elevate your arm to heart level on a table or a desk. Use the proper sized cuff. It should fit smoothly and snugly around your bare upper arm. There should be enough room to slip a fingertip under the cuff. The bottom edge of the cuff should be 1 inch above the crease of the elbow. Ideally, take 3 measurements at one sitting and record the average.  Important lifestyle changes to control high blood pressure  Intervention  Effect on the BP  Lose extra pounds and watch your waistline Weight loss is one of the most effective lifestyle changes for controlling blood pressure. If you're overweight or obese, losing even a small amount of weight can help reduce blood pressure. Blood pressure might go down by about 1 millimeter of mercury (mm Hg) with each kilogram (about 2.2 pounds) of weight lost.  Exercise regularly As a general goal, aim for at least 30 minutes of moderate physical activity every day. Regular physical activity can lower high blood pressure by about 5 to 8 mm Hg.  Eat a healthy diet Eating a diet rich in whole grains, fruits, vegetables, and low-fat dairy products and low in saturated fat and cholesterol. A  healthy diet can lower high blood pressure by up to 11 mm Hg.  Reduce salt (sodium) in your diet Even a small reduction of sodium in the diet can improve heart health and reduce high blood pressure by about 5 to 6 mm Hg.  Limit alcohol One drink equals 12 ounces of beer, 5 ounces of wine, or 1.5 ounces of 80-proof liquor.  Limiting alcohol to less than one drink a day for women or two drinks a day for men can help lower blood pressure by about 4 mm Hg.   If you have any questions or concerns please use My Chart to send questions or call the office at [phone]

## 2023-12-25 NOTE — Assessment & Plan Note (Addendum)
 Assessment: BP is uncontrolled in office BP 150/90 mmHg heart rate 54 (goal <130/80) Takes current BP meds regularly and tolerates them well without any side effects Denies SOB, palpitation, chest pain, headaches,or swelling Eats processed food every day once a day, however home cooked meals are low in sodium  Reiterated the importance of regular exercise and low salt diet    Plan:  Increase amlodipine  dose from 5 mg to 10 mg daily   Continue taking Lisinopril  40 mg daily and carvedilol  25 mg daily  Patient to keep record of BP readings with heart rate and report to us  at the next visit Patient to see PharmD in 4-5 weeks for follow up  Follow up lab(s) : none  In future given his renal function can consider adding hydralazine low dose if BP remain above the goal

## 2023-12-25 NOTE — Progress Notes (Signed)
 Patient ID: Jeffrey Holmes                 DOB: 26-Feb-1951                      MRN: [id number]      HPI: Jeffrey Holmes is a 73 y.o. male referred by [name], NP to HTN clinic. PMH is significant for T2DM, HTN, HLD,CAD, CKD stage 3b.   Patient saw NP on 03/26/23 BP was elevated Coreg  was increased to 6.125 mg twice daily and advised to keep BOP log and report back to the office on 02/07 his home BP and office BP was high so carvedilol  dose was titrated up to 12.5 mg twice daily. Renal  artery Duplex was ordered to rule out secondary cause,renal artery study showed no evidence of renal artery stenosis. Carvedilol  dose was increased from 12.5 mg to 25 mg twice daily on 10/29/2023 and amlodipine  was added on 12/01/2023   Pt presented today for BP follow up. Did not bring home monitor for validation or home BP log he thought he is seeing Dr. [name] today. He recalls from memory his home BP is about the same as before starting amlodipine . It has been little over 2 weeks since he started taking amlodipine . He still eats processed food that is high in sodium. We discussed in detail how important for him to watch on hidden sodium and cut down on consuming food that are high in sodium. We briefly talk about his diabetes management and he showed some interest in starting GLP 1 agent and he want his endocrinologist to be aware if we make any changes.  Last BMP - SrCr 2.48 - CrCL 27 mL/min (adj BW)  Current HTN meds: Coreg  25 mg twice daily, lisinopril  40 mg daily, amlodipine  10 mg daily  Previously tried: none  BP goal: <130/80   Family History:   Relation Problem Comments  Mother (Deceased at age 64) Diabetes   Heart attack angina  Kidney failure     Father (Deceased at age 17) Throat cancer     Sister Diabetes     Maternal Grandmother (Deceased)   Maternal Grandfather (Deceased)   Paternal Grandmother (Deceased)   Paternal Grandfather (Deceased)     Social History:  Alcohol: none  Smoking: none   Diet: l Eats out once or twice a month  Breakfast- cereal with small amt of milk, toast without butter tea without sugar  Lunch- skips most days and some days slice of pizza, hamburger, or salads without dressing  Dinner- spaghetti, salmon, salads, frozen vegetables- green beans, corn, turnip greens, chicken - broiled or backed  Drink: water , tea, soda 4 times per week  Snack: potato chips, popcorn, corn chips, fruits- banana, apple, grapes- 12   Exercise:   Walking 1-2 miles 3-4 times per week    Home BP readings: 150/70  recalls from memory   Wt Readings from Last 3 Encounters:  12/01/23 194 lb 3.2 oz (88.1 kg)  10/29/23 185 lb (83.9 kg)  09/05/23 184 lb (83.5 kg)   BP Readings from Last 3 Encounters:  12/25/23 (!) 150/90  12/01/23 (!) 150/76  10/29/23 (!) 155/78   Pulse Readings from Last 3 Encounters:  12/25/23 (!) 54  12/01/23 (!) 55  10/29/23 68    Renal function: CrCl cannot be calculated (Patient's most recent lab result is older than the maximum 21 days allowed.).  Past Medical History:  Diagnosis Date  Allergy    Benign essential HTN 08/08/2015   CAD (coronary artery disease), native coronary artery 08/28/2015   Severe multivessel ASCAD s/p CABG hybrid with LIMA to LAD and diag and PCI of the RCA.    Chronic back pain    L5-S1   Chronic kidney disease, stage 3 (HCC)    multiple kidney stones   Coronary artery disease    Diabetes (HCC)    Diabetes mellitus without complication (HCC)    TYPE 2   GERD (gastroesophageal reflux disease)    OCC   Glaucoma    H/O hiatal hernia    Hepatic cyst 11/01/2016   History of acute renal failure 2015   secondary to stones, ELEVATED CREATININE, NO DIALYSIS DONE   History of kidney stones    Hx of adenomatous colonic polyps 06/11/2017   Hyperlipidemia 08/08/2015   Hypertension    Kidney stones    NAFLD (nonalcoholic fatty liver disease) 04/54/0981   PONV (postoperative nausea and vomiting)    hx of years ago     Splenic cyst 11/01/2016   Ureteral calculus 01/06/2014    Current Outpatient Medications on File Prior to Visit  Medication Sig Dispense Refill   atorvastatin  (LIPITOR ) 80 MG tablet Take 1 tablet by mouth once daily 90 tablet 2   carvedilol  (COREG ) 25 MG tablet Take 1 tablet (25 mg total) by mouth 2 (two) times daily with a meal. 90 tablet 3   clopidogrel  (PLAVIX ) 75 MG tablet Take 1 tablet by mouth once daily 90 tablet 3   Continuous Glucose Receiver (FREESTYLE LIBRE 3 READER) DEVI as directed.     Continuous Glucose Sensor (FREESTYLE LIBRE 3 PLUS SENSOR) MISC Apply topically.     Evolocumab  (REPATHA  SURECLICK) 140 MG/ML SOAJ Inject 140 mg into the skin every 14 (fourteen) days. 6 mL 3   Insulin  Glargine (BASAGLAR  KWIKPEN) 100 UNIT/ML SOPN Inject 0.2 mLs (20 Units total) into the skin at bedtime. (Patient taking differently: Inject 32 Units into the skin at bedtime.) 5 pen 11   insulin  lispro (HUMALOG ) 100 UNIT/ML injection Inject 10-20 Units into the skin daily as needed for high blood sugar. With meals as needed  15-20 units with meals as needed  > 150= 20 units  ? 100= 15 units     lisinopril  (ZESTRIL ) 40 MG tablet Take 1 tablet by mouth once daily 90 tablet 2   sildenafil (REVATIO) 20 MG tablet Take 20 mg by mouth as needed.     tadalafil (CIALIS) 20 MG tablet Take 20 mg by mouth daily as needed for erectile dysfunction.     Vitamin D, Ergocalciferol, (DRISDOL) 1.25 MG (50000 UNIT) CAPS capsule Take 50,000 Units by mouth once a week.     No current facility-administered medications on file prior to visit.    Allergies  Allergen Reactions   Vicodin [Hydrocodone-Acetaminophen ] Nausea And Vomiting   Dulaglutide Nausea Only   Empagliflozin     Other Reaction(s): worsening of creatinine   Hydrocodone Nausea And Vomiting   Oxycontin  [Oxycodone  Hcl] Nausea And Vomiting   Percocet [Oxycodone -Acetaminophen ] Nausea And Vomiting    Blood pressure (!) 150/90, pulse (!) 54, SpO2  96%.   Assessment/Plan:  1. Hypertension -  Benign essential HTN Assessment: BP is uncontrolled in office BP 150/90 mmHg heart rate 54 (goal <130/80) Takes current BP meds regularly and tolerates them well without any side effects Denies SOB, palpitation, chest pain, headaches,or swelling Eats processed food every day once a day, however home cooked meals  are low in sodium  Reiterated the importance of regular exercise and low salt diet    Plan:  Increase amlodipine  dose from 5 mg to 10 mg daily   Continue taking Lisinopril  40 mg daily and carvedilol  25 mg daily  Patient to keep record of BP readings with heart rate and report to us  at the next visit Patient to see PharmD in 4-5 weeks for follow up  Follow up lab(s) : none  In future given his renal function can consider adding hydralazine low dose if BP remain above the goal       Thank you  Nickola Baron, Pharm.D [facility] [address], [place], [place] [address]  Phone: [phone]; Fax: [phone]

## 2023-12-30 DIAGNOSIS — E119 Type 2 diabetes mellitus without complications: Secondary | ICD-10-CM | POA: Diagnosis not present

## 2024-01-06 DIAGNOSIS — H401131 Primary open-angle glaucoma, bilateral, mild stage: Secondary | ICD-10-CM | POA: Diagnosis not present

## 2024-01-09 DIAGNOSIS — I2510 Atherosclerotic heart disease of native coronary artery without angina pectoris: Secondary | ICD-10-CM | POA: Diagnosis not present

## 2024-01-09 DIAGNOSIS — Z794 Long term (current) use of insulin: Secondary | ICD-10-CM | POA: Diagnosis not present

## 2024-01-09 DIAGNOSIS — E1151 Type 2 diabetes mellitus with diabetic peripheral angiopathy without gangrene: Secondary | ICD-10-CM | POA: Diagnosis not present

## 2024-01-09 DIAGNOSIS — R609 Edema, unspecified: Secondary | ICD-10-CM | POA: Diagnosis not present

## 2024-01-09 DIAGNOSIS — E785 Hyperlipidemia, unspecified: Secondary | ICD-10-CM | POA: Diagnosis not present

## 2024-01-09 DIAGNOSIS — E039 Hypothyroidism, unspecified: Secondary | ICD-10-CM | POA: Diagnosis not present

## 2024-01-09 DIAGNOSIS — I10 Essential (primary) hypertension: Secondary | ICD-10-CM | POA: Diagnosis not present

## 2024-01-20 ENCOUNTER — Ambulatory Visit: Admitting: Pharmacist

## 2024-02-03 ENCOUNTER — Other Ambulatory Visit: Payer: Self-pay | Admitting: Pharmacist

## 2024-02-03 NOTE — Progress Notes (Addendum)
 02/03/2024 Name: Jeffrey Holmes MRN: [id number] DOB: Jan 11, 1951  Chief Complaint  Patient presents with   Diabetes    Jeffrey Holmes is a 73 y.o. year old male who presented for a telephone visit.   They were referred to the pharmacist by a quality report for assistance in managing diabetes as True [place] Metric DM patient.  Subjective:  Care Team: Primary Care Provider: Pcp, No ; Next Scheduled Visit: 02/20/24- for Dr. Faythe (Endo) Clinical Pharmacist: [name], PharmD  Medication Access/Adherence  Current Pharmacy:  [facility] [address], [place] - [address] [place] [address] Phone: [phone] Fax: [phone]   Patient reports affordability concerns with their medications: No  Patient reports access/transportation concerns to their pharmacy: No  Patient reports adherence concerns with their medications:  No     Diabetes:  Current medications: Basaglar  20 U daily, Lispro 15-20 U at evening meal (sliding scale); 5 U if snack If your blood sugar is 70 to 199 mg/dL, then take 30 units. If your blood sugar is 200 to 299 mg/dL, then take 34 units. If your blood sugar is 300 to 399 mg/dL, then take 38 units. If your blood sugar is 400 mg/dL or higher, then take 42 units. *Prior= Actually taking Lispro 10-25U with meals and snacks depending on level of carbs in meal (NOT scale above)   Medications tried in the past: Libre 2 (2023), Trulicity (nausea), Jardiance (worsening of creatinine)  Current glucose readings on 02/03/24: (decreased insulin  doses but did NOT start Mounjaro yet) Average: 168 4 readings under: 170, 192, 155, 151 High: 37% Target: 62% Below: 1%  Current glucose readings on 12/11/23:  Average: 155 4 readings under: 154, 157, 142, 167 Above: 24% Target: 75% Below: 1% Low glucose events: Usually during daytime occasionally  Current glucose readings on 11/20/23:  Average: 146 4 readings under: 143,  151, 148, 144 Above: 14% Target: 86% Below: 0% Low glucose events: 1 (6PM-12AM) *Lows usually occur before 6PM he thought   Glucose readings on 4/10: Average 154 Current glucose readings on 3/11 visit: 195 to 205 range, 135 today Using OneTouch Verio meter; testing 1-2 times daily   Patient denies hypoglycemic s/sx including dizziness, shakiness, sweating. Patient denies hyperglycemic symptoms including polyuria, polydipsia, polyphagia, nocturia, neuropathy, blurred vision.  Current meal patterns:  - Occasional morning: Cereal or egg; usually skips though - One meal per day at 3-6PM: Processed foods or fast food - Snacks: Banana, peanut butter crackers prior to bed sometimes, PB&J sandwich - Drinks: Water , sugar-free lemonade *Tries to avoid baked goods and ice cream  Current physical activity: 2 miles when able  Current medication access support: FEP BCBS Advantage Standard  09/30/23: A1c 12.3%, eGFR ~33  Objective:  Lab Results  Component Value Date   HGBA1C 8.1 (H) 11/18/2020    Lab Results  Component Value Date   CREATININE 2.06 (H) 11/21/2022   BUN 21 11/21/2022   NA 149 (H) 11/21/2022   K 4.2 11/21/2022   CL 116 (HH) 11/21/2022   CO2 18 (L) 11/21/2022    Lab Results  Component Value Date   CHOL 103 12/01/2023   HDL 43 12/01/2023   LDLCALC 32 12/01/2023   LDLDIRECT 13 07/08/2016   TRIG 169 (H) 12/01/2023   CHOLHDL 2.4 12/01/2023    Medications Reviewed Today   Medications were not reviewed in this encounter       Assessment/Plan:   Diabetes: - Currently uncontrolled -  Reviewed long term cardiovascular and renal outcomes of uncontrolled blood sugar - Reviewed goal A1c, goal fasting, and goal 2 hour post prandial glucose - Patient denies personal or family history of multiple endocrine neoplasia type 2, medullary thyroid  cancer; personal history of pancreatitis or gallbladder disease. - Took advise to correct low BG's with less orange juice and  sugar; said glucose tablets don't work quickly but will pack some in his car to help in case of emergencies (instead of having to rush home each time)   Follow Up Plan:  - Follow-up on 03/12/24 for DM check-in and new A1c - Using  reader to monitor BG - Discussion about starting Pat today- will take first shot on Thursday or Friday so it will have 3 weeks total of injection information to evaluate at Dr. Micheline follow-up and determine if dose increase appropriate - Mounjaro should be $35 monthly - Requesting Zofran  Rx for emergency when starting the Mounjaro due to severe nausea with Trulicity prior   [name], PharmD [facility]  Phone Number: [phone]

## 2024-03-05 DIAGNOSIS — K219 Gastro-esophageal reflux disease without esophagitis: Secondary | ICD-10-CM | POA: Diagnosis not present

## 2024-03-05 DIAGNOSIS — H1045 Other chronic allergic conjunctivitis: Secondary | ICD-10-CM | POA: Diagnosis not present

## 2024-03-05 DIAGNOSIS — J3089 Other allergic rhinitis: Secondary | ICD-10-CM | POA: Diagnosis not present

## 2024-03-08 DIAGNOSIS — N400 Enlarged prostate without lower urinary tract symptoms: Secondary | ICD-10-CM | POA: Diagnosis not present

## 2024-03-08 DIAGNOSIS — N5201 Erectile dysfunction due to arterial insufficiency: Secondary | ICD-10-CM | POA: Diagnosis not present

## 2024-03-08 DIAGNOSIS — Z87442 Personal history of urinary calculi: Secondary | ICD-10-CM | POA: Diagnosis not present

## 2024-03-12 ENCOUNTER — Telehealth: Payer: Self-pay | Admitting: Pharmacist

## 2024-03-12 NOTE — Progress Notes (Signed)
 03/12/2024 Name: AKARI CRYSLER MRN: [id number] DOB: 1950-08-27  Chief Complaint  Patient presents with   Diabetes    KARO ROG is a 73 y.o. year old male who presented for a telephone visit.   They were referred to the pharmacist by a quality report for assistance in managing diabetes as True [place] Metric DM patient.  Subjective:  Care Team: Primary Care Provider: Pcp, No ; Next Scheduled Visit: None- for Dr. Faythe (Endo) Clinical Pharmacist: [name], PharmD  Medication Access/Adherence  Current Pharmacy:  [facility] [address] [place] - [address] [place] [address] Phone: [phone] Fax: [phone]   Patient reports affordability concerns with their medications: No  Patient reports access/transportation concerns to their pharmacy: No  Patient reports adherence concerns with their medications:  No     Diabetes:  Current medications: Basaglar 20 U daily, Lispro 15-20 U at evening meal (sliding scale); 5 U if snack If your blood sugar is 70 to 199 mg/dL, then take 30 units. If your blood sugar is 200 to 299 mg/dL, then take 34 units. If your blood sugar is 300 to 399 mg/dL, then take 38 units. If your blood sugar is 400 mg/dL or higher, then take 42 units. *Prior= Actually taking Lispro 10-25U with meals and snacks depending on level of carbs in meal (NOT scale above)   Medications tried in the past: Libre 2 (2023), Trulicity (nausea), Jardiance (worsening of creatinine)  Current glucose readings from 03/12/24: Average: 196 4 readings under: 209, 198, 183, 192 High: 61% Target: 39% Below: 0%  Current glucose readings on 02/03/24: (decreased insulin doses but did NOT start Mounjaro yet) Average: 168 4 readings under: 170, 192, 155, 151 High: 37% Target: 62% Below: 1%  Current glucose readings on 12/11/23:  Average: 155 4 readings under: 154, 157, 142, 167 Above: 24% Target: 75% Below: 1% Low  glucose events: Usually during daytime occasionally  Current glucose readings on 11/20/23:  Average: 146 4 readings under: 143, 151, 148, 144 Above: 14% Target: 86% Below: 0% Low glucose events: 1 (6PM-12AM) *Lows usually occur before 6PM he thought   Glucose readings on 4/10: Average 154 Current glucose readings on 3/11 visit: 195 to 205 range, 135 today Using OneTouch Verio meter; testing 1-2 times daily   Patient denies hypoglycemic s/sx including dizziness, shakiness, sweating. Patient denies hyperglycemic symptoms including polyuria, polydipsia, polyphagia, nocturia, neuropathy, blurred vision.  Current meal patterns:  - Occasional morning: Cereal or egg; usually skips though - One meal per day at 3-6PM: Processed foods or fast food - Snacks: Banana, peanut butter crackers prior to bed sometimes, PB&J sandwich - Drinks: Water, sugar-free lemonade *Tries to avoid baked goods and ice cream  Current physical activity: 2 miles when able  Current medication access support: FEP [employer] Advantage Standard  09/30/23: A1c 12.3%, eGFR ~33  Objective:  Lab Results  Component Value Date   HGBA1C 8.1 (H) 11/18/2020    Lab Results  Component Value Date   CREATININE 2.06 (H) 11/21/2022   BUN 21 11/21/2022   NA 149 (H) 11/21/2022   K 4.2 11/21/2022   CL 116 (HH) 11/21/2022   CO2 18 (L) 11/21/2022    Lab Results  Component Value Date   CHOL 103 12/01/2023   HDL 43 12/01/2023   LDLCALC 32 12/01/2023   LDLDIRECT 13 07/08/2016   TRIG 169 (H) 12/01/2023   CHOLHDL 2.4 12/01/2023    Medications Reviewed Today  Medications were not reviewed in this encounter       Assessment/Plan:   Diabetes: - Currently uncontrolled - Reviewed long term cardiovascular and renal outcomes of uncontrolled blood sugar - Reviewed goal A1c, goal fasting, and goal 2 hour post prandial glucose - Patient denies personal or family history of multiple endocrine neoplasia type 2, medullary  thyroid  cancer; personal history of pancreatitis or gallbladder disease. - Took advise to correct low BG's with less orange juice and sugar; said glucose tablets don't work quickly but will pack some in his car to help in case of emergencies (instead of having to rush home each time)   Follow Up Plan:  - Follow-up on 03/22/24 for DM check-in after 2 doses of Mounjaro 2.5mg  weekly - Using Winchester reader to monitor BG - Mounjaro should be $35 monthly - Patient agreed to start first dose of Mounjaro 2.5mg  tonight at 10PM- has Zofran  if needed - Will work on diet as well over this timeframe - Advised if not starting Mounjaro today, need to go back to the Basaglar  32U daily  *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

## 2024-03-17 ENCOUNTER — Other Ambulatory Visit: Payer: Self-pay | Admitting: Nurse Practitioner

## 2024-03-22 ENCOUNTER — Telehealth: Payer: Self-pay | Admitting: Pharmacist

## 2024-03-22 NOTE — Progress Notes (Signed)
   03/22/2024  Patient ID: Jeffrey Holmes, male   DOB: [birth year], 73 y.o.   MRN: [id number]  Patient returned my call today. Reports currently taking Basaglar  20U in AM + Lispro 25U with meals. Recently increased from 20U to 25U with Lispro on his own.   Plan going forward is to MODIFY Lispro to 25U with other meals + 20U with lunch (having lows after lunchtime). INCREASE Basaglar  to 22U each morning.  Will see how his readings look in 2 weeks.   From added 5U of mealtime insulin : Average dropped 18 points, In target increased by 14%, lows increased by 2%, high decreased by 16%  -----  Average: 178 4 numbers below: 197, 191, 143, 179  Above: 45% In target: 53% Below: 2%  Fasting: 187  3 events in past 7 days; 4 events in past 14 days Between noon to 6PM  Breakfast (9AM): Leftovers, breakfast sandwich, eggs + bacon (skips breakfast sometimes- does NOT take insulin  these times) Lunch (noon to 3PM): Sandwich, leftovers, 2 slices pizza, chicken Dinner: Salad, chicken, pizza; skips it sometimes Drinks: 12 oz soda, water , Koolaid, lemonade, tea More thirsty than he is hungry  **Follow-Up Plan:**  - Follow-up call on 04/02/24 with the patient (prefers Fridays at 9AM) - MODIFY Lispro to 25U other meals + 20U with lunch (having lows after lunchtime) - INCREASE Basaglar  22U each morning  - Did NOT start the Mounjaro (nausea potential is still concerning him) - Goal of safely adjusting insulin  closer to goal with no lows; try to get in-target to 65%  [name], PharmD [place]  Phone Number: [phone]

## 2024-04-02 ENCOUNTER — Telehealth: Payer: Self-pay | Admitting: Pharmacist

## 2024-04-02 NOTE — Progress Notes (Signed)
   04/02/2024  Patient ID: Jeffrey Holmes, male   DOB: 1950/08/03, 73 y.o.   MRN: [id number]  8/25 visit:  Average: 178 4 numbers below: 197, 191, 143, 179  Above: 45% In target: 53% Below: 2%  Fasting: 187  3 events in past 7 days; 4 events in past 14 days Between noon to [facility]  9/5 visit:  Average: 160 (14-day) 4 numbers below: 170, 169, 156, 143 Average: 171 (7-day) 197, 180, 151, 154  Above: 25% In target: 75% Below: 0%  Fasting: 176   Breakfast (9AM): Leftovers, breakfast sandwich, eggs + bacon (skips breakfast sometimes- does NOT take insulin  these times) Lunch (noon to 3PM): Sandwich, leftovers, 2 slices pizza, chicken Dinner: Salad, chicken, pizza; skips it sometimes Drinks: 12 oz soda, water , Koolaid, lemonade, tea More thirsty than he is hungry  **Follow-Up Plan:**  - Follow-up call on 04/15/24 with the patient (prefers Fridays at 9AM) - Continue Lispro to 25U other meals + 20U with lunch (having lows after lunchtime) - INCREASE Basaglar  26U each morning  - Advised if he has any lows, to go back to the 22U daily- confirmed understanding - Did NOT start the Mounjaro (nausea potential is still concerning him) - Advised patient to call the office and schedule his next DM follow-up for early November (this will give him time to lower A1c again before rechecking and avoiding holidays)  **Update eCW med list at next call   [name], PharmD [name]  Phone Number: [phone]

## 2024-04-08 DIAGNOSIS — N1832 Chronic kidney disease, stage 3b: Secondary | ICD-10-CM | POA: Diagnosis not present

## 2024-04-14 DIAGNOSIS — E872 Acidosis, unspecified: Secondary | ICD-10-CM | POA: Diagnosis not present

## 2024-04-14 DIAGNOSIS — E559 Vitamin D deficiency, unspecified: Secondary | ICD-10-CM | POA: Diagnosis not present

## 2024-04-14 DIAGNOSIS — M109 Gout, unspecified: Secondary | ICD-10-CM | POA: Diagnosis not present

## 2024-04-14 DIAGNOSIS — N184 Chronic kidney disease, stage 4 (severe): Secondary | ICD-10-CM | POA: Diagnosis not present

## 2024-04-14 DIAGNOSIS — N261 Atrophy of kidney (terminal): Secondary | ICD-10-CM | POA: Diagnosis not present

## 2024-04-14 DIAGNOSIS — N135 Crossing vessel and stricture of ureter without hydronephrosis: Secondary | ICD-10-CM | POA: Diagnosis not present

## 2024-04-14 DIAGNOSIS — N200 Calculus of kidney: Secondary | ICD-10-CM | POA: Diagnosis not present

## 2024-04-14 DIAGNOSIS — I129 Hypertensive chronic kidney disease with stage 1 through stage 4 chronic kidney disease, or unspecified chronic kidney disease: Secondary | ICD-10-CM | POA: Diagnosis not present

## 2024-04-15 ENCOUNTER — Telehealth: Payer: Self-pay | Admitting: Pharmacist

## 2024-04-15 NOTE — Progress Notes (Signed)
   04/15/2024  Patient ID: Jeffrey Holmes Fellows, male   DOB: 05-10-1951, 73 y.o.   MRN: [id number]  8/25 visit:  Average: 178 4 numbers below: 197, 191, 143, 179  Above: 45% In target: 53% Below: 2%  Fasting: 187  3 events in past 7 days; 4 events in past 14 days Between noon to [facility]  9/5 visit:  Average: 160 (14-day) 4 numbers below: 170, 169, 156, 143 Average: 171 (7-day) 197, 180, 151, 154  Above: 25% In target: 75% Below: 0%  Fasting: 176  9/18 visit:  Average: 144 (7-day) 4 numbers below: 140, 155, 147, 136  Above: 13% In target: 87% Below: 0%  Fasting: 130 (was 112 at 5AM)  Breakfast (9AM): Leftovers, breakfast sandwich, eggs + bacon (skips breakfast sometimes- does NOT take insulin  these times) Lunch (noon to 3PM): Sandwich, leftovers, 2 slices pizza, chicken Dinner: Salad, chicken, pizza; skips it sometimes Drinks: 12 oz soda, water , Koolaid, lemonade, tea More thirsty than he is hungry  **Follow-Up Plan:**  - Follow-up call on 05/07/24 with the patient (prefers Fridays at 9AM) - Continue Lispro to 25U other meals + 15-20U with lunch - Continue Basaglar  23U each morning  - Advised patient to call the office and schedule his next DM follow-up for early November (this will give him time to lower A1c again before rechecking and avoiding holidays) - Reports his eGFR is now at 20 after meeting with Nephrology- advised he hs to strongly focus on DM + HTN management to preserve current function - Reviewed that snacks that required insulin  are carb-heavy and >15g of carbs  - At next call, ask about Carvedilol , Repatha , and Vitamin D weekly     [name], PharmD [facility]  Phone Number: [phone]

## 2024-04-15 NOTE — Addendum Note (Signed)
 Addended by: Jossue Rubenstein M on: 04/15/2024 11:14 AM   Modules accepted: Orders

## 2024-04-15 NOTE — Progress Notes (Signed)
   04/15/2024  Patient ID: Jeffrey Holmes, male   DOB: 17-Dec-1950, 73 y.o.   MRN: [id number]  Attempted to contact patient for scheduled appointment for diabetes management. Left HIPAA compliant message for patient to return my call at their convenience.     [name], PharmD [facility]  Phone Number: [phone]

## 2024-05-07 ENCOUNTER — Telehealth: Payer: Self-pay | Admitting: Pharmacist

## 2024-05-07 NOTE — Progress Notes (Signed)
   05/07/2024  Patient ID: Jeffrey Holmes, male   DOB: 01-Oct-1950, 73 y.o.   MRN: [id number]  9/5 visit:  Average: 160 (14-day) 4 numbers below: 170, 169, 156, 143 Average: 171 (7-day) 197, 180, 151, 154  Above: 25% In target: 75% Below: 0%  Fasting: 176  9/18 visit:  Average: 144 (7-day) 4 numbers below: 140, 155, 147, 136  Above: 13% In target: 87% Below: 0%  Fasting: 130 (was 112 at 5AM)  10/10 visit: Average: 129 4 numbers below: 130, 138, 122, 127  Above: 9% In target: 85% Below: 5%  Breakfast (9AM): Leftovers, breakfast sandwich, eggs + bacon (skips breakfast sometimes- does NOT take insulin  these times) Lunch (noon to 3PM): Sandwich, leftovers, 2 slices pizza, chicken Dinner: Salad, chicken, pizza; skips it sometimes Drinks: 12 oz soda, water , Koolaid, lemonade, tea More thirsty than he is hungry  **Follow-Up Plan:**  - Follow-up call on 05/21/24 with the patient (prefers Fridays at 9:30AM) - Continue Lispro to 25U other meals + 15-20U with lunch - Continue Basaglar  23U each morning- self increased to 26 since last call - Plans to call and reschedule 10/14 appt as he has an eye exam during that timeframe he forgot about - Reports his eGFR is now at 20 after meeting with Nephrology- advised he has to strongly focus on DM + HTN management to preserve current function - Reviewed that snacks that required insulin  are carb-heavy and >15g of carbs  - Side note: Confirms taking Carvedilol  twice a day and has poor adherence with Repatha - difficulty with the twice a month dosing (advised it should be free with the [employer] grant at next pick-up; should give himself the medication today since he's due)  Patient returned my call. Discussed the above. Main goal is reduced percentage of lows. Needs to decrease Basaglar  back to 23U daily and consider decreasing Lispro to 20U depending on the meal with breakfast and dinner. Confirmed understanding and will work on this- dose  report going long periods of time without eating occasionally. Calling office to change appt date with Dr. Faythe to avoid no-show fees.   Jeffrey Holmes, PharmD [facility]  Phone Number: [phone]

## 2024-05-20 DIAGNOSIS — Z794 Long term (current) use of insulin: Secondary | ICD-10-CM | POA: Diagnosis not present

## 2024-05-20 DIAGNOSIS — E039 Hypothyroidism, unspecified: Secondary | ICD-10-CM | POA: Diagnosis not present

## 2024-05-20 DIAGNOSIS — E785 Hyperlipidemia, unspecified: Secondary | ICD-10-CM | POA: Diagnosis not present

## 2024-05-20 DIAGNOSIS — E1151 Type 2 diabetes mellitus with diabetic peripheral angiopathy without gangrene: Secondary | ICD-10-CM | POA: Diagnosis not present

## 2024-05-20 DIAGNOSIS — I2510 Atherosclerotic heart disease of native coronary artery without angina pectoris: Secondary | ICD-10-CM | POA: Diagnosis not present

## 2024-05-21 ENCOUNTER — Encounter (HOSPITAL_COMMUNITY): Payer: Self-pay

## 2024-05-21 ENCOUNTER — Ambulatory Visit (HOSPITAL_COMMUNITY): Admission: EM | Admit: 2024-05-21 | Discharge: 2024-05-21 | Disposition: A | Discharge status: Home / Self Care

## 2024-05-21 ENCOUNTER — Telehealth: Payer: Self-pay | Admitting: Pharmacist

## 2024-05-21 DIAGNOSIS — J069 Acute upper respiratory infection, unspecified: Secondary | ICD-10-CM

## 2024-05-21 MED ORDER — AZELASTINE HCL 0.1 % NA SOLN: 1.0000 | Freq: Two times a day (BID) | NASAL | 1 refills | Status: AC

## 2024-05-21 MED ORDER — PROMETHAZINE-DM 6.25-15 MG/5ML PO SYRP: 10.0000 mL | ORAL_SOLUTION | Freq: Three times a day (TID) | ORAL | 0 refills | Status: AC | PRN

## 2024-05-21 NOTE — Discharge Instructions (Signed)
  1. Viral upper respiratory tract infection (Primary) - azelastine (ASTELIN) 0.1 % nasal spray; Place 1 spray into both nostrils 2 (two) times daily. Use in each nostril as directed  Dispense: 30 mL; Refill: 1 - promethazine -dextromethorphan (PROMETHAZINE -DM) 6.25-15 MG/5ML syrup; Take 10 mLs by mouth 3 (three) times daily as needed for cough.  Dispense: 240 mL; Refill: 0 -Continue to monitor symptoms for any change in severity if there is any escalation of current symptoms or development of new symptoms follow-up in ER for further evaluation and management.

## 2024-05-21 NOTE — ED Triage Notes (Signed)
 Patient c/o nasal congestion with light yellow mucus x 4-5 days. Patient states it is constantly running down into his throat.  Patient states he has been taking allergy medication for his symptoms.

## 2024-05-21 NOTE — Progress Notes (Signed)
   05/21/2024  Patient ID: Jeffrey Holmes Fellows, male   DOB: 1950-11-14, 73 y.o.   MRN: [id number]  Called and spoke with the patient on the phone today for a DM follow-up. Had visit with Dr. Faythe yesterday. A1c re-checked at visit and was a 7.0% (at goal).   Congratulated him on his work and reiterated the small insulin  dose change suggested by Dr. Faythe, which was to make the Basaglar  20U daily. Continue meal-time insulin  as he has been.   For BP, he has been keeping an eye on this. Will start occasionally writing BP down 1-2 hours after taking Carvedilol  and Amlodipine  in the morning to see how his readings look. Reiterated need to keep BG and BP under control to prevent further kidney worsening.  Asked about fatigue today. Reports taking a Vitamin D tablet once a week. Advised this is importance to follow dosing as this should increase his mood and energy levels over the next 3 months and likely rechecked at that time. Of note, patient's TSH did come back elevated, but Dr. Faythe felt it was not high enough to treat, which can contribute to fatigue as well. Only other suggestion is Vitamin B12 testing.   Will try to get his lab work from [facility] again. Next visit with them is 11/19. Requested our follow-up on 11/21 to discuss how the visit went.  Called [facility]. [place] they will go ahead and fax the information over now. Therefore, should be present in the chart over the next week hopefully for lab review.    [name], PharmD, [facility] [facility] Phone Number: [phone]

## 2024-05-21 NOTE — ED Provider Notes (Signed)
 [facility]  Note:  This document was prepared using Conservation officer, historic buildings and may include unintentional dictation errors.  MRN: [id number] DOB: [birth year]  Subjective:   Jeffrey Holmes is a 73 y.o. male presenting for nasal congestion, postnasal drip, cough, usually worse at night and mild sore throat.  Patient reports he tried taking over-the-counter allergy medication to help with postnasal drip but was unsuccessful.  Patient has not tried any other over-the-counter medication to treat symptoms.  Denies any fever, body aches, fatigue, nausea/vomiting.  Patient denies any known sick contacts.  No shortness of breath, chest pain, weakness, dizziness.  No current facility-administered medications for this encounter.  Current Outpatient Medications:    azelastine (ASTELIN) 0.1 % nasal spray, Place 1 spray into both nostrils 2 (two) times daily. Use in each nostril as directed, Disp: 30 mL, Rfl: 1   promethazine -dextromethorphan (PROMETHAZINE -DM) 6.25-15 MG/5ML syrup, Take 10 mLs by mouth 3 (three) times daily as needed for cough., Disp: 240 mL, Rfl: 0   amLODipine  (NORVASC ) 10 MG tablet, Take 1 tablet (10 mg total) by mouth daily., Disp: 180 tablet, Rfl: 3   atorvastatin  (LIPITOR ) 80 MG tablet, Take 1 tablet by mouth once daily, Disp: 90 tablet, Rfl: 2   carvedilol  (COREG ) 25 MG tablet, Take 1 tablet (25 mg total) by mouth 2 (two) times daily with a meal., Disp: 90 tablet, Rfl: 3   clopidogrel  (PLAVIX ) 75 MG tablet, Take 1 tablet by mouth once daily, Disp: 90 tablet, Rfl: 3   Continuous Glucose Receiver (FREESTYLE LIBRE 3 READER) DEVI, as directed., Disp: , Rfl:    Continuous Glucose Sensor (FREESTYLE LIBRE 3 PLUS SENSOR) MISC, Apply topically., Disp: , Rfl:    EMBECTA PEN NEEDLE NANO 2 GEN 32G X 4 MM MISC, 3 (three) times daily., Disp: , Rfl:    Evolocumab  (REPATHA  SURECLICK) 140 MG/ML SOAJ, Inject 140 mg into the skin every 14 (fourteen) days., Disp: 6 mL, Rfl: 3    Insulin  Glargine (BASAGLAR  KWIKPEN) 100 UNIT/ML SOPN, Inject 0.2 mLs (20 Units total) into the skin at bedtime. (Patient taking differently: Inject 20 Units into the skin daily with breakfast.), Disp: 5 pen, Rfl: 11   insulin  lispro (HUMALOG ) 100 UNIT/ML injection, Inject 10-20 Units into the skin daily as needed for high blood sugar. With meals as needed  15-20 units with meals as needed  > 150= 20 units  ? 100= 15 units (Patient taking differently: Inject 20-25 Units into the skin 3 (three) times daily with meals. 25U other meals + 15-20U with lunch), Disp: , Rfl:    latanoprost  (XALATAN ) 0.005 % ophthalmic solution, SMARTSIG:1 Drop(s) In Eye(s) Every Evening, Disp: , Rfl:    lisinopril  (ZESTRIL ) 40 MG tablet, Take 1 tablet by mouth once daily (Patient not taking: Reported on 05/21/2024), Disp: 90 tablet, Rfl: 3   sildenafil (REVATIO) 20 MG tablet, Take 20 mg by mouth as needed. (Patient not taking: Reported on 04/15/2024), Disp: , Rfl:    sodium bicarbonate  650 MG tablet, Take 650 mg by mouth 2 (two) times daily., Disp: , Rfl:    tadalafil (CIALIS) 20 MG tablet, Take 20 mg by mouth daily as needed for erectile dysfunction., Disp: , Rfl:    timolol  (TIMOPTIC ) 0.5 % ophthalmic solution, 1 drop every morning., Disp: , Rfl:    Vitamin D, Ergocalciferol, (DRISDOL) 1.25 MG (50000 UNIT) CAPS capsule, Take 50,000 Units by mouth once a week., Disp: , Rfl:    Allergies  Allergen Reactions   Vicodin [  Hydrocodone-Acetaminophen ] Nausea And Vomiting   Dulaglutide Nausea Only   Empagliflozin     Other Reaction(s): worsening of creatinine   Hydrocodone Nausea And Vomiting   Oxycontin  [Oxycodone  Hcl] Nausea And Vomiting   Percocet [Oxycodone -Acetaminophen ] Nausea And Vomiting    Past Medical History:  Diagnosis Date   Allergy    Benign essential HTN 08/08/2015   CAD (coronary artery disease), native coronary artery 08/28/2015   Severe multivessel ASCAD s/p CABG hybrid with LIMA to LAD and diag and PCI of  the RCA.    Chronic back pain    L5-S1   Chronic kidney disease, stage 3 (HCC)    multiple kidney stones   Coronary artery disease    Diabetes (HCC)    Diabetes mellitus without complication (HCC)    TYPE 2   GERD (gastroesophageal reflux disease)    OCC   Glaucoma    H/O hiatal hernia    Hepatic cyst 11/01/2016   History of acute renal failure 2015   secondary to stones, ELEVATED CREATININE, NO DIALYSIS DONE   History of kidney stones    Hx of adenomatous colonic polyps 06/11/2017   Hyperlipidemia 08/08/2015   Hypertension    Kidney stones    NAFLD (nonalcoholic fatty liver disease) 95/93/7981   PONV (postoperative nausea and vomiting)    hx of years ago    Splenic cyst 11/01/2016   Ureteral calculus 01/06/2014     Past Surgical History:  Procedure Laterality Date   ANGIOPLASTY N/A 08/21/2015   Procedure: ANGIOPLASTY WITH PERCUTANEOUS CORONARY INTERVENTION WITH DES TO RIGHT CORONARY ARTERY;  Surgeon: Candyce GORMAN Reek, MD;  Location: [facility] OR;  Service: Cardiovascular;  Laterality: N/A;   BACK SURGERY  2006   microdisectomy L5-S1   CARDIAC CATHETERIZATION N/A 08/18/2015   Procedure: Left Heart Cath and Coronary Angiography;  Surgeon: Candyce GORMAN Reek, MD;  Location: [facility] INVASIVE CV LAB;  Service: Cardiovascular;  Laterality: N/A;   CARDIAC CATHETERIZATION N/A 08/21/2015   Procedure: Coronary Stent Intervention;  Surgeon: Candyce GORMAN Reek, MD;  Location: [facility] INVASIVE CV LAB;  Service: Cardiovascular;  Laterality: N/A;   CARDIAC CATHETERIZATION  08/21/2015   Procedure: Bypass Graft Angiography;  Surgeon: Candyce GORMAN Reek, MD;  Location: [facility] INVASIVE CV LAB;  Service: Cardiovascular;;   CARDIOVASCULAR STRESS TEST  08/15/2015   cataract surgery      COLONOSCOPY     CORONARY ARTERY BYPASS GRAFT N/A 08/21/2015   Procedure: OFF PUMP CORONARY ARTERY BYPASS GRAFTING (CABG), TIMES TWO, USING LEFT INTERNAL MAMMARY ARTERY, RIGHT GREATER SAPHENOUS VEIN HARVESTED ENDOSCOPICALLY;   Surgeon: Dallas KATHEE Jude, MD;  Location: [facility] OR;  Service: Open Heart Surgery;  Laterality: N/A;   CYSTOSCOPY W/ URETERAL STENT PLACEMENT Right 03/12/2017   Procedure: CYSTOSCOPY WITH RETROGRADE PYELOGRAM/URETERAL STENT PLACEMENT;  Surgeon: Gaston Hamilton, MD;  Location: [facility] ORS;  Service: Urology;  Laterality: Right;   CYSTOSCOPY W/ URETERAL STENT PLACEMENT Right 12/13/2020   Procedure: CYSTOSCOPY WITH RETROGRADE PYELOGRAM/URETERAL STENT PLACEMENT;  Surgeon: Matilda Senior, MD;  Location: [facility] OR[facility];  Service: Urology;  Laterality: Right;   CYSTOSCOPY WITH RETROGRADE PYELOGRAM, URETEROSCOPY AND STENT PLACEMENT Right 01/06/2014   Procedure: CYSTOSCOPY WITH RETROGRADE PYELOGRAM, URETEROSCOPY AND STENT PLACEMENT;  Surgeon: Alm GORMAN Fragmin, MD;  Location: [facility];  Service: Urology;  Laterality: Right;   CYSTOSCOPY/URETEROSCOPY/HOLMIUM LASER/STENT PLACEMENT Right 03/25/2017   Procedure: RIGHT URETEROSCOPY WITH HOLMIUM LASER STENT EXCHANGE;  Surgeon: Watt Rush, MD;  Location: [facility];  Service: Urology;  Laterality: Right;   CYSTOSCOPY/URETEROSCOPY/HOLMIUM LASER/STENT PLACEMENT Bilateral  11/17/2020   Procedure: CYSTOSCOPY/URETEROSCOPY/HOLMIUM LASER/STENT PLACEMENT;  Surgeon: Carolee Sherwood JONETTA DOUGLAS, MD;  Location: [facility];  Service: Urology;  Laterality: Bilateral;   CYSTOSCOPY/URETEROSCOPY/HOLMIUM LASER/STENT PLACEMENT Bilateral 12/05/2020   Procedure: CYSTOSCOPY BILATERAL /URETEROSCOPY/HOLMIUM LASER/STENT PLACEMENT;  Surgeon: Watt Rush, MD;  Location: [facility];  Service: Urology;  Laterality: Bilateral;   ESOPHAGOGASTRODUODENOSCOPY ENDOSCOPY     normal   LITHOTRIPSY     2-3 times in past   TEE WITHOUT CARDIOVERSION N/A 08/21/2015   Procedure: TRANSESOPHAGEAL ECHOCARDIOGRAM (TEE);  Surgeon: Dallas KATHEE Jude, MD;  Location: [facility] OR;  Service: Open Heart Surgery;  Laterality: N/A;   TONSILLECTOMY     URETEROSCOPY WITH HOLMIUM LASER LITHOTRIPSY Right 12/13/2020   Procedure: URETEROSCOPY WITH HOLMIUM LASER  LITHOTRIPSY;  Surgeon: Matilda Senior, MD;  Location: [facility];  Service: Urology;  Laterality: Right;   WISDOM TOOTH EXTRACTION      Family History  Problem Relation Age of Onset   Kidney failure Mother    Heart attack Mother        angina    Diabetes Mother    Throat cancer Father    Diabetes Sister    Colon cancer Neg Hx    Colon polyps Neg Hx    Rectal cancer Neg Hx    Stomach cancer Neg Hx     Social History   Tobacco Use   Smoking status: Never   Smokeless tobacco: Never  Vaping Use   Vaping status: Never Used  Substance Use Topics   Alcohol use: Never   Drug use: No    ROS Refer to HPI for ROS details.  Objective:    Vitals: BP (!) 178/83 (BP Location: Left Arm)   Pulse 65   Temp 99.1 F (37.3 C) (Oral)   Resp 16   SpO2 94%   Physical Exam Vitals and nursing note reviewed.  Constitutional:      General: He is not in acute distress.    Appearance: Normal appearance. He is well-developed. He is not ill-appearing or toxic-appearing.  HENT:     Head: Normocephalic.     Nose: Congestion and rhinorrhea present.     Mouth/Throat:     Mouth: Mucous membranes are moist.     Pharynx: Oropharynx is clear. Postnasal drip present. No oropharyngeal exudate or posterior oropharyngeal erythema.  Cardiovascular:     Rate and Rhythm: Normal rate.  Pulmonary:     Effort: Pulmonary effort is normal. No respiratory distress.     Breath sounds: No stridor. No wheezing.  Skin:    General: Skin is warm and dry.  Neurological:     General: No focal deficit present.     Mental Status: He is alert and oriented to person, place, and time.  Psychiatric:        Mood and Affect: Mood normal.        Behavior: Behavior normal.     Procedures  No results found for this or any previous visit (from the past 24 hours).  Assessment and Plan :     Discharge Instructions       1. Viral upper respiratory tract infection (Primary) - azelastine (ASTELIN) 0.1 % nasal  spray; Place 1 spray into both nostrils 2 (two) times daily. Use in each nostril as directed  Dispense: 30 mL; Refill: 1 - promethazine -dextromethorphan (PROMETHAZINE -DM) 6.25-15 MG/5ML syrup; Take 10 mLs by mouth 3 (three) times daily as needed for cough.  Dispense: 240 mL; Refill: 0 -Continue to monitor symptoms for any change  in severity if there is any escalation of current symptoms or development of new symptoms follow-up in ER for further evaluation and management.      Amarri Michaelson B Quan Cybulski   Kaiulani Sitton, [place], [place] 05/21/24 1727

## 2024-06-09 DIAGNOSIS — N184 Chronic kidney disease, stage 4 (severe): Secondary | ICD-10-CM | POA: Diagnosis not present

## 2024-06-16 ENCOUNTER — Other Ambulatory Visit: Payer: Self-pay | Admitting: Cardiology

## 2024-06-16 DIAGNOSIS — N200 Calculus of kidney: Secondary | ICD-10-CM | POA: Diagnosis not present

## 2024-06-16 DIAGNOSIS — E559 Vitamin D deficiency, unspecified: Secondary | ICD-10-CM | POA: Diagnosis not present

## 2024-06-16 DIAGNOSIS — M109 Gout, unspecified: Secondary | ICD-10-CM | POA: Diagnosis not present

## 2024-06-16 DIAGNOSIS — N261 Atrophy of kidney (terminal): Secondary | ICD-10-CM | POA: Diagnosis not present

## 2024-06-16 DIAGNOSIS — N2581 Secondary hyperparathyroidism of renal origin: Secondary | ICD-10-CM | POA: Diagnosis not present

## 2024-06-16 DIAGNOSIS — N184 Chronic kidney disease, stage 4 (severe): Secondary | ICD-10-CM | POA: Diagnosis not present

## 2024-06-16 DIAGNOSIS — I129 Hypertensive chronic kidney disease with stage 1 through stage 4 chronic kidney disease, or unspecified chronic kidney disease: Secondary | ICD-10-CM | POA: Diagnosis not present

## 2024-06-16 DIAGNOSIS — E872 Acidosis, unspecified: Secondary | ICD-10-CM | POA: Diagnosis not present

## 2024-06-22 ENCOUNTER — Telehealth: Payer: Self-pay | Admitting: Pharmacist

## 2024-06-22 NOTE — Progress Notes (Signed)
   06/22/2024  Patient ID: Jeffrey Holmes, male   DOB: March 11, 1951, 73 y.o.   MRN: [id number]  Patient returned my call this morning. We reviewed his BG readings below:  Average: 140 146, 143, 117, 151 Above: 12% In target: 85% Below: 3%  Low glucose events: 4 12PM to 6PM: 3 6PM to 12AM: 1  From recent Nephrology visit: stable eGFR between 15 to 20. Now taking Hydralazine twice a day and Lisinopril  20mg  twice a day for BP. Has not been checking readings lately. Tried on the phone and device needs new batteries.  Other concern is that Basaglar  pen has NOT been twisting up correctly. Advised to bring back to the pharmacy so they can contact the manufacturer or replace it.   **Summary for PCP:** - Follow-up call on 12/19 at 9:30AM - Need to get new batteries and start checking BP's with date and time in the morning 1-2 hours after meds - Started new Hydralazine and Lisinopril  by Nephro- monitor for swelling with Hydralazine (increase to twice a day today as prescribed) - Bring Basaglar  pens to [facility] for inspection - Monitor post-lunch lows and what he ate + how much insulin  he took to help avoid them in the future (not taking more than 15-20U with lunch) - Advised BG readings look good at current range (self-increased from 20 to 30U Basaglar  from last call)    [name], PharmD, [facility] Phone Number: [phone]

## 2024-07-11 ENCOUNTER — Ambulatory Visit (INDEPENDENT_AMBULATORY_CARE_PROVIDER_SITE_OTHER)

## 2024-07-11 ENCOUNTER — Ambulatory Visit: Admission: EM | Admit: 2024-07-11 | Discharge: 2024-07-11 | Disposition: A | Discharge status: Home / Self Care

## 2024-07-11 VITALS — BP 156/77 | HR 56 | Temp 97.9°F | Resp 17

## 2024-07-11 DIAGNOSIS — R059 Cough, unspecified: Secondary | ICD-10-CM | POA: Diagnosis not present

## 2024-07-11 DIAGNOSIS — J209 Acute bronchitis, unspecified: Secondary | ICD-10-CM | POA: Diagnosis not present

## 2024-07-11 MED ORDER — PREDNISONE 50 MG PO TABS: ORAL_TABLET | ORAL | 0 refills | Status: AC

## 2024-07-11 MED ORDER — BENZONATATE 100 MG PO CAPS: 100.0000 mg | ORAL_CAPSULE | Freq: Three times a day (TID) | ORAL | 0 refills | Status: AC

## 2024-07-11 MED ORDER — GUAIFENESIN ER 600 MG PO TB12: 600.0000 mg | ORAL_TABLET | Freq: Two times a day (BID) | ORAL | 0 refills | Status: AC

## 2024-07-11 NOTE — ED Provider Notes (Signed)
 [facility]    CSN: [id number] Arrival date & time: 07/11/24  1146      History   Chief Complaint No chief complaint on file.   HPI Jeffrey Holmes is a 73 y.o. male.   Patient presents today due to persistent cough that is occasionally productive for the past month.  Patient states that about a month ago he went to urgent care on [address] and was given a nasal spray and Promethazine  DM without any significant relief.  Patient denies fever, chills, nausea, vomiting, or change in appetite.    The history is provided by the patient.    Past Medical History:  Diagnosis Date   Allergy    Benign essential HTN 08/08/2015   CAD (coronary artery disease), native coronary artery 08/28/2015   Severe multivessel ASCAD s/p CABG hybrid with LIMA to LAD and diag and PCI of the RCA.    Chronic back pain    L5-S1   Chronic kidney disease, stage 3 (HCC)    multiple kidney stones   Coronary artery disease    Diabetes (HCC)    Diabetes mellitus without complication (HCC)    TYPE 2   GERD (gastroesophageal reflux disease)    OCC   Glaucoma    H/O hiatal hernia    Hepatic cyst 11/01/2016   History of acute renal failure 2015   secondary to stones, ELEVATED CREATININE, NO DIALYSIS DONE   History of kidney stones    Hx of adenomatous colonic polyps 06/11/2017   Hyperlipidemia 08/08/2015   Hypertension    Kidney stones    NAFLD (nonalcoholic fatty liver disease) 95/93/7981   PONV (postoperative nausea and vomiting)    hx of years ago    Splenic cyst 11/01/2016   Ureteral calculus 01/06/2014    Patient Active Problem List   Diagnosis Date Noted   Kidney stone on right side 12/13/2020   AKI (acute kidney injury) 11/17/2020   Uncontrolled type 2 diabetes mellitus with hyperglycemia (HCC) 04/05/2020   Hx of adenomatous colonic polyps 06/11/2017   Ureteral stone 03/12/2017   Diabetes (HCC) 11/11/2016   Hepatic cyst 11/01/2016   NAFLD (nonalcoholic fatty liver disease)  11/01/2016   Splenic cyst 11/01/2016   CAD (coronary artery disease), native coronary artery 11/03/2015   Abnormal result of cardiovascular function study 08/16/2015   Chronic renal insufficiency, stage III (moderate) 08/16/2015   Benign essential HTN 08/08/2015   Hyperlipidemia 08/08/2015    Past Surgical History:  Procedure Laterality Date   ANGIOPLASTY N/A 08/21/2015   Procedure: ANGIOPLASTY WITH PERCUTANEOUS CORONARY INTERVENTION WITH DES TO RIGHT CORONARY ARTERY;  Surgeon: Candyce GORMAN Reek, MD;  Location: [facility] OR;  Service: Cardiovascular;  Laterality: N/A;   BACK SURGERY  2006   microdisectomy L5-S1   CARDIAC CATHETERIZATION N/A 08/18/2015   Procedure: Left Heart Cath and Coronary Angiography;  Surgeon: Candyce GORMAN Reek, MD;  Location: [facility] INVASIVE CV LAB;  Service: Cardiovascular;  Laterality: N/A;   CARDIAC CATHETERIZATION N/A 08/21/2015   Procedure: Coronary Stent Intervention;  Surgeon: Candyce GORMAN Reek, MD;  Location: [facility] INVASIVE CV LAB;  Service: Cardiovascular;  Laterality: N/A;   CARDIAC CATHETERIZATION  08/21/2015   Procedure: Bypass Graft Angiography;  Surgeon: Candyce GORMAN Reek, MD;  Location: [facility] INVASIVE CV LAB;  Service: Cardiovascular;;   CARDIOVASCULAR STRESS TEST  08/15/2015   cataract surgery      COLONOSCOPY     CORONARY ARTERY BYPASS GRAFT N/A 08/21/2015   Procedure: OFF PUMP CORONARY ARTERY BYPASS  GRAFTING (CABG), TIMES TWO, USING LEFT INTERNAL MAMMARY ARTERY, RIGHT GREATER SAPHENOUS VEIN HARVESTED ENDOSCOPICALLY;  Surgeon: Dallas KATHEE Jude, MD;  Location: [facility] OR;  Service: Open Heart Surgery;  Laterality: N/A;   CYSTOSCOPY W/ URETERAL STENT PLACEMENT Right 03/12/2017   Procedure: CYSTOSCOPY WITH RETROGRADE PYELOGRAM/URETERAL STENT PLACEMENT;  Surgeon: Gaston Hamilton, MD;  Location: [facility] ORS;  Service: Urology;  Laterality: Right;   CYSTOSCOPY W/ URETERAL STENT PLACEMENT Right 12/13/2020   Procedure: CYSTOSCOPY WITH RETROGRADE PYELOGRAM/URETERAL STENT  PLACEMENT;  Surgeon: Matilda Senior, MD;  Location: [facility] ORS;  Service: Urology;  Laterality: Right;   CYSTOSCOPY WITH RETROGRADE PYELOGRAM, URETEROSCOPY AND STENT PLACEMENT Right 01/06/2014   Procedure: CYSTOSCOPY WITH RETROGRADE PYELOGRAM, URETEROSCOPY AND STENT PLACEMENT;  Surgeon: Alm GORMAN Fragmin, MD;  Location: [facility];  Service: Urology;  Laterality: Right;   CYSTOSCOPY/URETEROSCOPY/HOLMIUM LASER/STENT PLACEMENT Right 03/25/2017   Procedure: RIGHT URETEROSCOPY WITH HOLMIUM LASER STENT EXCHANGE;  Surgeon: Watt Rush, MD;  Location: [facility];  Service: Urology;  Laterality: Right;   CYSTOSCOPY/URETEROSCOPY/HOLMIUM LASER/STENT PLACEMENT Bilateral 11/17/2020   Procedure: CYSTOSCOPY/URETEROSCOPY/HOLMIUM LASER/STENT PLACEMENT;  Surgeon: Carolee Sherwood JONETTA DOUGLAS, MD;  Location: [facility];  Service: Urology;  Laterality: Bilateral;   CYSTOSCOPY/URETEROSCOPY/HOLMIUM LASER/STENT PLACEMENT Bilateral 12/05/2020   Procedure: CYSTOSCOPY BILATERAL /URETEROSCOPY/HOLMIUM LASER/STENT PLACEMENT;  Surgeon: Watt Rush, MD;  Location: [facility];  Service: Urology;  Laterality: Bilateral;   ESOPHAGOGASTRODUODENOSCOPY ENDOSCOPY     normal   LITHOTRIPSY     2-3 times in past   TEE WITHOUT CARDIOVERSION N/A 08/21/2015   Procedure: TRANSESOPHAGEAL ECHOCARDIOGRAM (TEE);  Surgeon: Dallas KATHEE Jude, MD;  Location: [facility] OR;  Service: Open Heart Surgery;  Laterality: N/A;   TONSILLECTOMY     URETEROSCOPY WITH HOLMIUM LASER LITHOTRIPSY Right 12/13/2020   Procedure: URETEROSCOPY WITH HOLMIUM LASER LITHOTRIPSY;  Surgeon: Matilda Senior, MD;  Location: [facility];  Service: Urology;  Laterality: Right;   WISDOM TOOTH EXTRACTION         Home Medications    Prior to Admission medications  Medication Sig Start Date End Date Taking? Authorizing Provider  benzonatate  (TESSALON ) 100 MG capsule Take 1 capsule (100 mg total) by mouth every 8 (eight) hours. 07/11/24  Yes Andra Corean BROCKS, PA-C  guaiFENesin  (MUCINEX ) 600 MG 12 hr tablet  Take 1 tablet (600 mg total) by mouth 2 (two) times daily for 10 days. 07/11/24 07/21/24 Yes Andra Corean C, PA-C  hydrALAZINE (APRESOLINE) 25 MG tablet Take 25 mg by mouth 2 (two) times daily. 06/16/24  Yes [provider]  predniSONE  (DELTASONE ) 50 MG tablet Take 1 tab po daily for 5 days 07/11/24  Yes Andra Corean BROCKS, PA-C  amLODipine  (NORVASC ) 10 MG tablet Take 1 tablet (10 mg total) by mouth daily. 12/25/23 05/21/24  Shlomo Wilbert SAUNDERS, MD  atorvastatin  (LIPITOR ) 80 MG tablet Take 1 tablet by mouth once daily 06/18/24   Turner, Wilbert SAUNDERS, MD  azelastine  (ASTELIN ) 0.1 % nasal spray Place 1 spray into both nostrils 2 (two) times daily. Use in each nostril as directed 05/21/24   Reddick, Johnathan B, NP  carvedilol  (COREG ) 25 MG tablet Take 1 tablet (25 mg total) by mouth 2 (two) times daily with a meal. 10/29/23   Shlomo Wilbert SAUNDERS, MD  clopidogrel  (PLAVIX ) 75 MG tablet Take 1 tablet by mouth once daily 09/16/23   Wyn Jackee VEAR Mickey., NP  Continuous Glucose Receiver (FREESTYLE LIBRE 3 READER) DEVI as directed. 10/09/23   [provider]  Continuous Glucose Sensor (FREESTYLE LIBRE 3 PLUS SENSOR) MISC Apply topically. 10/07/23  [provider]  EMBECTA PEN NEEDLE NANO 2 GEN 32G X 4 MM MISC 3 (three) times daily. 02/20/24   [provider]  Evolocumab  (REPATHA  SURECLICK) 140 MG/ML SOAJ Inject 140 mg into the skin every 14 (fourteen) days. 10/29/23   Shlomo Wilbert SAUNDERS, MD  Insulin  Glargine (BASAGLAR  KWIKPEN) 100 UNIT/ML SOPN Inject 0.2 mLs (20 Units total) into the skin at bedtime. Patient taking differently: Inject 20 Units into the skin daily with breakfast. 10/10/16   Kassie Mallick, MD  insulin  lispro (HUMALOG ) 100 UNIT/ML injection Inject 10-20 Units into the skin daily as needed for high blood sugar. With meals as needed  15-20 units with meals as needed  > 150= 20 units  ? 100= 15 units Patient taking differently: Inject 20-25 Units into the skin 3 (three) times  daily with meals. 25U other meals + 15-20U with lunch    [provider]  latanoprost  (XALATAN ) 0.005 % ophthalmic solution SMARTSIG:1 Drop(s) In Eye(s) Every Evening 02/27/24   [provider]  lisinopril  (ZESTRIL ) 40 MG tablet Take 1 tablet by mouth once daily Patient not taking: Reported on 05/21/2024 03/19/24   Patel, Vaishali K, RPH  promethazine -dextromethorphan (PROMETHAZINE -DM) 6.25-15 MG/5ML syrup Take 10 mLs by mouth 3 (three) times daily as needed for cough. 05/21/24   Reddick, Johnathan B, NP  sildenafil (REVATIO) 20 MG tablet Take 20 mg by mouth as needed. Patient not taking: Reported on 04/15/2024 08/06/23   [provider]  sodium bicarbonate  650 MG tablet Take 650 mg by mouth 2 (two) times daily.    [provider]  tadalafil (CIALIS) 20 MG tablet Take 20 mg by mouth daily as needed for erectile dysfunction.    [provider]  timolol  (TIMOPTIC ) 0.5 % ophthalmic solution 1 drop every morning. 02/20/24   [provider]  Vitamin D, Ergocalciferol, (DRISDOL) 1.25 MG (50000 UNIT) CAPS capsule Take 50,000 Units by mouth once a week. 11/03/23   [provider]    Family History Family History  Problem Relation Age of Onset   Kidney failure Mother    Heart attack Mother        angina    Diabetes Mother    Throat cancer Father    Diabetes Sister    Colon cancer Neg Hx    Colon polyps Neg Hx    Rectal cancer Neg Hx    Stomach cancer Neg Hx     Social History Social History[1]   Allergies   Vicodin [hydrocodone-acetaminophen ], Dulaglutide, Empagliflozin, Hydrocodone, Other, Oxycontin  [oxycodone  hcl], and Percocet [oxycodone -acetaminophen ]   Review of Systems Review of Systems   Physical Exam Triage Vital Signs ED Triage Vitals  Encounter Vitals Group     BP 07/11/24 1209 (!) 156/77     Girls Systolic BP Percentile --      Girls Diastolic BP Percentile --      Boys Systolic BP Percentile --      Boys  Diastolic BP Percentile --      Pulse Rate 07/11/24 1209 (!) 56     Resp 07/11/24 1209 17     Temp 07/11/24 1209 97.9 F (36.6 C)     Temp Source 07/11/24 1209 Oral     SpO2 07/11/24 1209 94 %     Weight --      Height --      Head Circumference --      Peak Flow --      Pain Score 07/11/24 1206 0  Pain Loc --      Pain Education --      Exclude from Growth Chart --    No data found.  Updated Vital Signs BP (!) 156/77 (BP Location: Right Arm)   Pulse (!) 56   Temp 97.9 F (36.6 C) (Oral)   Resp 17   SpO2 94%   Visual Acuity Right Eye Distance:   Left Eye Distance:   Bilateral Distance:    Right Eye Near:   Left Eye Near:    Bilateral Near:     Physical Exam Vitals and nursing note reviewed.  Constitutional:      General: He is not in acute distress.    Appearance: Normal appearance. He is not ill-appearing, toxic-appearing or diaphoretic.  Eyes:     General: No scleral icterus. Cardiovascular:     Rate and Rhythm: Normal rate and regular rhythm.     Heart sounds: Normal heart sounds.  Pulmonary:     Effort: Pulmonary effort is normal. No respiratory distress.     Breath sounds: Normal breath sounds. No wheezing or rhonchi.  Skin:    General: Skin is warm.  Neurological:     Mental Status: He is alert and oriented to person, place, and time.  Psychiatric:        Mood and Affect: Mood normal.        Behavior: Behavior normal.      UC Treatments / Results  Labs (all labs ordered are listed, but only abnormal results are displayed) Labs Reviewed - No data to display  EKG   Radiology DG Chest 2 View Result Date: 07/11/2024 EXAM: 2 VIEW(S) XRAY OF THE CHEST 07/11/2024 01:13:45 PM COMPARISON: 09/25/2015 CLINICAL HISTORY: cough FINDINGS: LUNGS AND PLEURA: No focal pulmonary opacity. No pleural effusion. No pneumothorax. HEART AND MEDIASTINUM: CABG changes noted. Atherosclerotic plaque. BONES AND SOFT TISSUES: Intact sternotomy wires. No acute osseous  abnormality. IMPRESSION: 1. No acute cardiopulmonary process. Electronically signed by: Morgane Naveau MD 07/11/2024 01:38 PM EST RP Workstation: [id number]    Procedures Procedures (including critical care time)  Medications Ordered in UC Medications - No data to display  Initial Impression / Assessment and Plan / UC Course  I have reviewed the triage vital signs and the nursing notes.  Pertinent labs & imaging results that were available during my care of the patient were reviewed by me and considered in my medical decision making (see chart for details).      Final Clinical Impressions(s) / UC Diagnoses   Final diagnoses:  Cough, unspecified type  Acute bronchitis, unspecified organism     Discharge Instructions      Preliminary overread of xray shows no pneumonia, if radiologist says something different will call you.    ED Prescriptions     Medication Sig Dispense Auth. Provider   benzonatate  (TESSALON ) 100 MG capsule Take 1 capsule (100 mg total) by mouth every 8 (eight) hours. 30 capsule Andra Corean BROCKS, PA-C   guaiFENesin  (MUCINEX ) 600 MG 12 hr tablet Take 1 tablet (600 mg total) by mouth 2 (two) times daily for 10 days. 20 tablet Maryama Kuriakose C, PA-C   predniSONE  (DELTASONE ) 50 MG tablet Take 1 tab po daily for 5 days 5 tablet Andra Corean BROCKS, PA-C      PDMP not reviewed this encounter.    [1]  Social History Tobacco Use   Smoking status: Never   Smokeless tobacco: Never  Vaping Use   Vaping status: Never Used  Substance Use  Topics   Alcohol use: Never   Drug use: No     Andra Corean BROCKS, PA-C 07/11/24 1342

## 2024-07-11 NOTE — Discharge Instructions (Addendum)
 Preliminary overread of xray shows no pneumonia, if radiologist says something different will call you.

## 2024-07-11 NOTE — ED Triage Notes (Signed)
 Reports going to Urgent Care on 10/24 and was dx with URI and prescribed promethazine  and Azelastine  nasal spray. States all other symptoms resolved aside from the cough. Reports the promethazine  has been ineffective, and thinks an abx will be beneficial.

## 2024-07-16 ENCOUNTER — Telehealth: Payer: Self-pay | Admitting: Pharmacist

## 2024-07-16 NOTE — Progress Notes (Signed)
" ° °  07/16/2024  Patient ID: Jeffrey Holmes, male   DOB: [birth year], 72 y.o.   MRN: [id number]  Patient returned my call. 246 currently- drank glass of Koolaid with medications. He is also on Prednisone  50mg  daily for 5 days (today is 5th day) for acute bronchitis.   Average BG: 219 250, 218, 169, 234 Above: 61% In target: 38% Below: 1%  Continues on same DM regimen.   DM medications: Basaglar  30U in the morning, Lispro 25U with other meals and 20 units with lunch  Discussed dietary recommendations like eggs and cheese. Advised to stop drinking as much Eggnog and Koolaid as that makes it go up.  Plan: - Follow-up call for DM review on 09/03/24 at 9:30AM - BG significantly increased from last call on 11/25 (average BG of 140 to 219 now) - Need to focus on dietary changes to get readings within range again- no insulin  adjustment for now - Has NOT checked BP lately- was 156/77 at 12/14 Urgent Care visit; need to start checking and writing it down- bring to visit with Dr. Faythe on 1/23   [name], PharmD, [facility] Phone Number: [phone]  "

## 2024-09-03 ENCOUNTER — Telehealth: Payer: Self-pay | Admitting: Pharmacist

## 2024-09-03 NOTE — Progress Notes (Signed)
" ° °  09/03/2024  Patient ID: Jeffrey Holmes, male   DOB: Jan 26, 1951, 74 y.o.   MRN: [id number]  Called and left voicemail requesting callback at earliest convenience for DM check-in. Appears A1c looked good at last Endo visit still at 7.2%. Started thyroid  medication (Levothyroxine 25mcg). Main concern for kidney function decrease is due to BP. Was 146/74 at last visit.   Called and spoke with the patient on the phone. BG of 88 while on the phone.  Average glucose: 149 163, 156, 126, 148  Above: 22% In target: 75% Below: 3%  Doesn't eat lunch at all a good portion of the time, which is reason for low. Kidney doctor on 2/19  **Summary for PCP:** - Follow-up on 3/6 at 9AM for DM check-in call - Completed med review on the phone - Start checking BP's and writing them down 1-2 hrs after taking morning meds about 1 week prior to Nephro appt- bring to visit for review - Needs to ask Nephro about Lisinopril  dosing based on BP readings- has been taking 1 tab occasionally - Refill Vitamin D weekly at the pharmacy - No changes to BG regimen; continue to monitor for lows around 12:30-3PM   [name], PharmD, [facility] Phone Number: [phone]  "
